# Patient Record
Sex: Female | Born: 1970 | Race: White | Hispanic: No | Marital: Married | Smoking: Current some day smoker
Health system: Southern US, Community
[De-identification: ages and names within clinical notes are randomized; demographics above are authoritative.]

## PROBLEM LIST (undated history)

## (undated) DIAGNOSIS — F32A Depression, unspecified: Secondary | ICD-10-CM

## (undated) DIAGNOSIS — E669 Obesity, unspecified: Secondary | ICD-10-CM

## (undated) DIAGNOSIS — E079 Disorder of thyroid, unspecified: Secondary | ICD-10-CM

## (undated) DIAGNOSIS — T7840XA Allergy, unspecified, initial encounter: Secondary | ICD-10-CM

## (undated) DIAGNOSIS — E7800 Pure hypercholesterolemia, unspecified: Secondary | ICD-10-CM

## (undated) DIAGNOSIS — K8020 Calculus of gallbladder without cholecystitis without obstruction: Secondary | ICD-10-CM

## (undated) DIAGNOSIS — K219 Gastro-esophageal reflux disease without esophagitis: Secondary | ICD-10-CM

## (undated) DIAGNOSIS — R0902 Hypoxemia: Secondary | ICD-10-CM

## (undated) DIAGNOSIS — M1990 Unspecified osteoarthritis, unspecified site: Secondary | ICD-10-CM

## (undated) DIAGNOSIS — M47816 Spondylosis without myelopathy or radiculopathy, lumbar region: Secondary | ICD-10-CM

## (undated) DIAGNOSIS — M549 Dorsalgia, unspecified: Secondary | ICD-10-CM

## (undated) DIAGNOSIS — F419 Anxiety disorder, unspecified: Secondary | ICD-10-CM

## (undated) DIAGNOSIS — K7581 Nonalcoholic steatohepatitis (NASH): Secondary | ICD-10-CM

## (undated) DIAGNOSIS — G709 Myoneural disorder, unspecified: Secondary | ICD-10-CM

## (undated) DIAGNOSIS — K7460 Unspecified cirrhosis of liver: Secondary | ICD-10-CM

## (undated) DIAGNOSIS — E119 Type 2 diabetes mellitus without complications: Secondary | ICD-10-CM

## (undated) HISTORY — DX: Anxiety disorder, unspecified: F41.9

## (undated) HISTORY — DX: Calculus of gallbladder without cholecystitis without obstruction: K80.20

## (undated) HISTORY — PX: LIVER BIOPSY: SHX301

## (undated) HISTORY — DX: Obesity, unspecified: E66.9

## (undated) HISTORY — DX: Myoneural disorder, unspecified: G70.9

## (undated) HISTORY — DX: Pure hypercholesterolemia, unspecified: E78.00

## (undated) HISTORY — DX: Gastro-esophageal reflux disease without esophagitis: K21.9

## (undated) HISTORY — DX: Hypoxemia: R09.02

## (undated) HISTORY — DX: Depression, unspecified: F32.A

## (undated) HISTORY — PX: TONSILLECTOMY: SUR1361

## (undated) HISTORY — PX: TOTAL ABDOMINAL HYSTERECTOMY: SHX209

---

## 1997-11-09 ENCOUNTER — Other Ambulatory Visit: Admission: RE | Admit: 1997-11-09 | Discharge: 1997-11-09 | Payer: Self-pay | Admitting: Obstetrics and Gynecology

## 1998-06-16 ENCOUNTER — Inpatient Hospital Stay (HOSPITAL_COMMUNITY): Admission: AD | Admit: 1998-06-16 | Discharge: 1998-06-19 | Payer: Self-pay | Admitting: Obstetrics & Gynecology

## 1999-08-02 ENCOUNTER — Emergency Department (HOSPITAL_COMMUNITY): Admission: EM | Admit: 1999-08-02 | Discharge: 1999-08-02 | Payer: Self-pay | Admitting: Emergency Medicine

## 2000-09-05 ENCOUNTER — Emergency Department (HOSPITAL_COMMUNITY): Admission: EM | Admit: 2000-09-05 | Discharge: 2000-09-06 | Payer: Self-pay | Admitting: *Deleted

## 2000-09-06 ENCOUNTER — Encounter: Payer: Self-pay | Admitting: Emergency Medicine

## 2000-11-04 ENCOUNTER — Emergency Department (HOSPITAL_COMMUNITY): Admission: EM | Admit: 2000-11-04 | Discharge: 2000-11-04 | Payer: Self-pay

## 2001-11-22 ENCOUNTER — Encounter: Payer: Self-pay | Admitting: Emergency Medicine

## 2001-11-22 ENCOUNTER — Emergency Department (HOSPITAL_COMMUNITY): Admission: EM | Admit: 2001-11-22 | Discharge: 2001-11-22 | Payer: Self-pay | Admitting: Emergency Medicine

## 2002-02-28 ENCOUNTER — Other Ambulatory Visit: Admission: RE | Admit: 2002-02-28 | Discharge: 2002-02-28 | Payer: Self-pay | Admitting: Obstetrics & Gynecology

## 2002-09-30 ENCOUNTER — Emergency Department (HOSPITAL_COMMUNITY): Admission: EM | Admit: 2002-09-30 | Discharge: 2002-09-30 | Payer: Self-pay | Admitting: Emergency Medicine

## 2003-02-21 HISTORY — PX: ABDOMINAL HYSTERECTOMY: SHX81

## 2003-06-22 ENCOUNTER — Emergency Department (HOSPITAL_COMMUNITY): Admission: EM | Admit: 2003-06-22 | Discharge: 2003-06-22 | Payer: Self-pay | Admitting: Family Medicine

## 2003-07-30 ENCOUNTER — Inpatient Hospital Stay (HOSPITAL_COMMUNITY): Admission: AD | Admit: 2003-07-30 | Discharge: 2003-07-31 | Payer: Self-pay | Admitting: Obstetrics & Gynecology

## 2003-08-06 ENCOUNTER — Other Ambulatory Visit: Admission: RE | Admit: 2003-08-06 | Discharge: 2003-08-06 | Payer: Self-pay | Admitting: Obstetrics and Gynecology

## 2003-08-17 ENCOUNTER — Encounter (INDEPENDENT_AMBULATORY_CARE_PROVIDER_SITE_OTHER): Payer: Self-pay | Admitting: Specialist

## 2003-08-17 ENCOUNTER — Ambulatory Visit (HOSPITAL_COMMUNITY): Admission: RE | Admit: 2003-08-17 | Discharge: 2003-08-17 | Payer: Self-pay | Admitting: Obstetrics and Gynecology

## 2003-12-24 ENCOUNTER — Ambulatory Visit: Payer: Self-pay | Admitting: Family Medicine

## 2004-02-08 ENCOUNTER — Ambulatory Visit: Payer: Self-pay | Admitting: Family Medicine

## 2004-02-17 ENCOUNTER — Ambulatory Visit: Payer: Self-pay | Admitting: Family Medicine

## 2004-02-29 ENCOUNTER — Ambulatory Visit: Payer: Self-pay | Admitting: Family Medicine

## 2004-05-23 ENCOUNTER — Ambulatory Visit: Payer: Self-pay | Admitting: Family Medicine

## 2004-07-03 ENCOUNTER — Emergency Department (HOSPITAL_COMMUNITY): Admission: EM | Admit: 2004-07-03 | Discharge: 2004-07-03 | Payer: Self-pay | Admitting: Emergency Medicine

## 2004-07-06 ENCOUNTER — Emergency Department (HOSPITAL_COMMUNITY): Admission: EM | Admit: 2004-07-06 | Discharge: 2004-07-06 | Payer: Self-pay | Admitting: Emergency Medicine

## 2004-08-23 ENCOUNTER — Ambulatory Visit: Payer: Self-pay | Admitting: Internal Medicine

## 2004-08-25 ENCOUNTER — Emergency Department (HOSPITAL_COMMUNITY): Admission: EM | Admit: 2004-08-25 | Discharge: 2004-08-25 | Payer: Self-pay | Admitting: Family Medicine

## 2004-10-18 ENCOUNTER — Ambulatory Visit: Payer: Self-pay | Admitting: Family Medicine

## 2004-11-08 ENCOUNTER — Ambulatory Visit: Payer: Self-pay | Admitting: Internal Medicine

## 2004-11-10 ENCOUNTER — Inpatient Hospital Stay (HOSPITAL_COMMUNITY): Admission: AD | Admit: 2004-11-10 | Discharge: 2004-11-10 | Payer: Self-pay | Admitting: *Deleted

## 2004-11-15 ENCOUNTER — Ambulatory Visit: Payer: Self-pay | Admitting: *Deleted

## 2004-11-22 ENCOUNTER — Encounter: Payer: Self-pay | Admitting: Obstetrics and Gynecology

## 2004-11-22 ENCOUNTER — Ambulatory Visit: Payer: Self-pay | Admitting: Obstetrics and Gynecology

## 2004-12-06 ENCOUNTER — Ambulatory Visit: Payer: Self-pay | Admitting: Obstetrics & Gynecology

## 2004-12-29 ENCOUNTER — Ambulatory Visit: Payer: Self-pay | Admitting: Obstetrics and Gynecology

## 2005-02-20 ENCOUNTER — Ambulatory Visit: Payer: Self-pay | Admitting: Obstetrics and Gynecology

## 2005-02-20 ENCOUNTER — Inpatient Hospital Stay (HOSPITAL_COMMUNITY): Admission: RE | Admit: 2005-02-20 | Discharge: 2005-02-22 | Payer: Self-pay | Admitting: Obstetrics and Gynecology

## 2005-02-21 ENCOUNTER — Encounter (INDEPENDENT_AMBULATORY_CARE_PROVIDER_SITE_OTHER): Payer: Self-pay | Admitting: Specialist

## 2005-03-09 ENCOUNTER — Ambulatory Visit: Payer: Self-pay | Admitting: Obstetrics and Gynecology

## 2005-06-21 ENCOUNTER — Emergency Department (HOSPITAL_COMMUNITY): Admission: EM | Admit: 2005-06-21 | Discharge: 2005-06-21 | Payer: Self-pay | Admitting: Family Medicine

## 2005-07-17 ENCOUNTER — Emergency Department (HOSPITAL_COMMUNITY): Admission: EM | Admit: 2005-07-17 | Discharge: 2005-07-17 | Payer: Self-pay | Admitting: Emergency Medicine

## 2005-10-11 ENCOUNTER — Emergency Department (HOSPITAL_COMMUNITY): Admission: EM | Admit: 2005-10-11 | Discharge: 2005-10-11 | Payer: Self-pay | Admitting: Family Medicine

## 2005-11-03 ENCOUNTER — Emergency Department (HOSPITAL_COMMUNITY): Admission: EM | Admit: 2005-11-03 | Discharge: 2005-11-03 | Payer: Self-pay | Admitting: Emergency Medicine

## 2006-02-02 ENCOUNTER — Emergency Department (HOSPITAL_COMMUNITY): Admission: EM | Admit: 2006-02-02 | Discharge: 2006-02-02 | Payer: Self-pay | Admitting: Emergency Medicine

## 2006-03-13 ENCOUNTER — Emergency Department (HOSPITAL_COMMUNITY): Admission: EM | Admit: 2006-03-13 | Discharge: 2006-03-13 | Payer: Self-pay | Admitting: Family Medicine

## 2006-08-09 ENCOUNTER — Emergency Department (HOSPITAL_COMMUNITY): Admission: EM | Admit: 2006-08-09 | Discharge: 2006-08-09 | Payer: Self-pay | Admitting: Emergency Medicine

## 2006-08-20 ENCOUNTER — Emergency Department (HOSPITAL_COMMUNITY): Admission: EM | Admit: 2006-08-20 | Discharge: 2006-08-20 | Payer: Self-pay | Admitting: Emergency Medicine

## 2006-09-10 DIAGNOSIS — J45909 Unspecified asthma, uncomplicated: Secondary | ICD-10-CM | POA: Insufficient documentation

## 2006-09-10 DIAGNOSIS — R519 Headache, unspecified: Secondary | ICD-10-CM | POA: Insufficient documentation

## 2006-09-10 DIAGNOSIS — R51 Headache: Secondary | ICD-10-CM

## 2006-09-10 DIAGNOSIS — F411 Generalized anxiety disorder: Secondary | ICD-10-CM | POA: Insufficient documentation

## 2006-09-10 DIAGNOSIS — F329 Major depressive disorder, single episode, unspecified: Secondary | ICD-10-CM

## 2006-10-03 ENCOUNTER — Emergency Department (HOSPITAL_COMMUNITY): Admission: EM | Admit: 2006-10-03 | Discharge: 2006-10-03 | Payer: Self-pay | Admitting: Emergency Medicine

## 2006-10-18 ENCOUNTER — Emergency Department (HOSPITAL_COMMUNITY): Admission: EM | Admit: 2006-10-18 | Discharge: 2006-10-18 | Payer: Self-pay | Admitting: Emergency Medicine

## 2007-01-13 ENCOUNTER — Emergency Department (HOSPITAL_COMMUNITY): Admission: EM | Admit: 2007-01-13 | Discharge: 2007-01-13 | Payer: Self-pay | Admitting: Emergency Medicine

## 2007-04-17 ENCOUNTER — Emergency Department (HOSPITAL_COMMUNITY): Admission: EM | Admit: 2007-04-17 | Discharge: 2007-04-17 | Payer: Self-pay | Admitting: Family Medicine

## 2007-06-19 ENCOUNTER — Ambulatory Visit: Payer: Self-pay | Admitting: Family Medicine

## 2007-09-09 ENCOUNTER — Emergency Department (HOSPITAL_COMMUNITY): Admission: EM | Admit: 2007-09-09 | Discharge: 2007-09-09 | Payer: Self-pay | Admitting: Family Medicine

## 2007-11-25 ENCOUNTER — Emergency Department (HOSPITAL_COMMUNITY): Admission: EM | Admit: 2007-11-25 | Discharge: 2007-11-25 | Payer: Self-pay | Admitting: Family Medicine

## 2008-02-05 ENCOUNTER — Encounter: Admission: RE | Admit: 2008-02-05 | Discharge: 2008-02-05 | Payer: Self-pay | Admitting: Internal Medicine

## 2008-02-19 ENCOUNTER — Emergency Department (HOSPITAL_COMMUNITY): Admission: EM | Admit: 2008-02-19 | Discharge: 2008-02-19 | Payer: Self-pay | Admitting: Family Medicine

## 2008-04-30 ENCOUNTER — Emergency Department (HOSPITAL_COMMUNITY): Admission: EM | Admit: 2008-04-30 | Discharge: 2008-04-30 | Payer: Self-pay | Admitting: Emergency Medicine

## 2009-02-12 ENCOUNTER — Emergency Department (HOSPITAL_COMMUNITY): Admission: EM | Admit: 2009-02-12 | Discharge: 2009-02-12 | Payer: Self-pay | Admitting: Family Medicine

## 2009-06-09 ENCOUNTER — Emergency Department (HOSPITAL_COMMUNITY): Admission: EM | Admit: 2009-06-09 | Discharge: 2009-06-09 | Payer: Self-pay | Admitting: Family Medicine

## 2009-06-30 ENCOUNTER — Emergency Department (HOSPITAL_COMMUNITY): Admission: EM | Admit: 2009-06-30 | Discharge: 2009-06-30 | Payer: Self-pay | Admitting: Family Medicine

## 2010-05-03 LAB — DIFFERENTIAL
Monocytes Absolute: 0.5 10*3/uL (ref 0.1–1.0)
Monocytes Relative: 7 % (ref 3–12)
Neutro Abs: 3.9 10*3/uL (ref 1.7–7.7)
Neutrophils Relative %: 57 % (ref 43–77)

## 2010-05-03 LAB — CBC
HCT: 45.1 % (ref 36.0–46.0)
Platelets: 176 10*3/uL (ref 150–400)
RBC: 5.13 MIL/uL — ABNORMAL HIGH (ref 3.87–5.11)
RDW: 13.1 % (ref 11.5–15.5)

## 2010-06-03 NOTE — Op Note (Signed)
NAMETRESA, Teresa Castaneda NO.:  [id number]   MEDICAL RECORD NO.:  [id number]          PATIENT TYPE:  INP   LOCATION:  9317                          FACILITY:  [facility]   PHYSICIAN:  Phil D. Kalman Shan, M.D.     DATE OF BIRTH:  Jun 21, 1970   DATE OF PROCEDURE:  02/20/2005  DATE OF DISCHARGE:                                 OPERATIVE REPORT   PROCEDURE:  Total vaginal hysterectomy.   PREOPERATIVE DIAGNOSES:  1.  Menometrorrhagia.  2.  Dysmenorrhea.   POSTOPERATIVE DIAGNOSES:  1.  Menometrorrhagia.  2.  Dysmenorrhea.  3.  Pending pathology report.   SURGEON:  Franchot Heidelberg. Kalman Shan, M.D.   ASSISTANT:  Standley Dakins. Kennon Rounds, M.D.   ANESTHESIA:  General.   POSTOPERATIVE CONDITION:  Satisfactory.   ESTIMATED BLOOD LOSS:  150 mL.   SPECIMENS TO PATHOLOGY:  Uterus.   PROCEDURE:  Under satisfactory general anesthesia, the patient in dorsal  lithotomy position, the perineum and vagina were prepped and draped in the  usual sterile manner.  Bimanual pelvic examination revealed a uterus of  normal size, shape, consistency, anteflexed, freely movable, with normal  free adnexa.  A weighted speculum was placed at the posterior fourchette of  the vagina through a marital introitus, BUS within normal limits, vagina is  clean and parous, and was grasped to Lahey clamps.  Circumferential  injections of 1% Xylocaine with 1:200,000 epinephrine was injected  submucosally around the entire cervix approximately 1 cm from the distal  end.  A circular incision over that same area was done with a scalpel and  using blunt dissection, the tissue was pushed away from the distal end of  the cervix.  The cul-de-sac of Nathaneil Canary was opened by sharp dissection and a  figure-of-eight suture of #1 chromic was placed there for hemostasis.  Then  using a Heaney clamp, the pedicles were serially clamped and divided  starting with the uterosacrals., te cardinals and the packet containing the  uterine vessels.  These were  all ligated with #1 chromic catgut suture  ligatures.  The peritoneal cavity was then entered beneath the bladder by  sharp dissection and a final pedicle taken including the from the posterior  to the anterior peritoneum, ligated with #1 chromic catgut suture ligature  after being divided away from the uterus, a free tie placed around the  remaining utero-ovarian pedicles, and the tissue medially clamped, divided  and the lateral pedicle ligated with #1 chromic catgut suture ligatures  where the tie was held long.  There was a small bleeder on the left side,  which was grasped in the pedicle and ligated with a free tie of #1 chromic  catgut.  The area was observed.  No significant bleeding was noted,  and the vaginal cuff was closed with continuous running #1 chromic catgut,  locked, on an atraumatic needle going from peritoneum through mucosa.  All  sutures were then cut short, a Foley catheter placed in the bladder, which  was draining clear amber urine at the end of the procedure.  ______________________________  Franchot Heidelberg Kalman Shan, M.D.     PDR/MEDQ  D:  02/20/2005  T:  02/20/2005  Job:  [id number]

## 2010-06-03 NOTE — Group Therapy Note (Signed)
Teresa Castaneda, Teresa Castaneda NO.:  [id number]   MEDICAL RECORD NO.:  [id number]          PATIENT TYPE:  WOC   LOCATION:  [facility]                   FACILITY:  [facility]   PHYSICIAN:  Andrew Au, MD        DATE OF BIRTH:  January 09, 1971   DATE OF SERVICE:  12/29/2004                                    CLINIC NOTE   The patient is a 41 year old gravida 2, para 2-0-0-2, with a history of  postpartum tubal ligation, endometrial balloon ablation, several D&Cs, and  hysteroscopy for intractable dysmenorrhea and menorrhagia.  At one time in  the past, she bled for 72 days prior to the ablation, but now again is  bleeding heavily and having severe disabling pain.  Her mother and sister  both had hysterectomies for endometriosis and she was under the impression  that she was told she had endometriosis although we cannot find any record  in the hospital records of laparoscopy to confirm that.  On the other hand,  the patient has symptoms that are very consistent with adenomyosis, and  desires hysterectomy.  We are going to have scheduled for a vaginal  hysterectomy and we discussed with her the possible complications,  especially those related to anesthesia, injury to bowel, urinary tract,  postpartum hemorrhage, infection, and death.  We will schedule her for  vaginal hysterectomy.  We told her we would not take out the ovaries unless  there was some abnormality with them.   DIAGNOSIS:  Intractable dysmenorrhea and menorrhagia probably secondary to  adenomyosis.           ______________________________  Andrew Au, MD     PR/MEDQ  D:  12/29/2004  T:  12/29/2004  Job:  [id number]

## 2010-06-03 NOTE — Op Note (Signed)
NAME:  Teresa Castaneda, Teresa Castaneda                         ACCOUNT NO.:  [id number]   MEDICAL RECORD NO.:  [id number]                   PATIENT TYPE:  SPE   LOCATION:  [facility]                                 FACILITY:  [facility]   PHYSICIAN:  Michelle L. Helane Rima, M.D.            DATE OF BIRTH:  08/09/1970   DATE OF PROCEDURE:  08/17/2003  DATE OF DISCHARGE:                                 OPERATIVE REPORT   PREOPERATIVE DIAGNOSIS:  ____________   POSTOPERATIVE DIAGNOSIS:  _____________   OPERATION PERFORMED:  Dilation and curettage.   SURGEON:  Michelle L. Helane Rima, M.D.   ANESTHESIA:  LMA with paracervical block.   ESTIMATED BLOOD LOSS:  Less than 50 mL.   DESCRIPTION OF PROCEDURE:  The patient was taken to the operating room.  She  was given anesthesia and placed in the lithotomy position.  She was prepped  and draped in the usual sterile fashion.  In and out catheter was used to  empty the bladder.  A speculum was inserted into the vagina.  The cervix was  grasped with a tenaculum and a paracervical block was performed in standard  fashion.  The uterus was sounded to 9 cm and  noted to be in the midline.  The cervical internal os was gently dilated using Pratt dilators.  The  diagnostic hysteroscope was inserted and with good visualization and good  distention, the entire endometrium was visualized.  There was a large blood  clot in the endometrium.  There was some polypoid endometrium and some  polypoid appearing tissue emanating from the anterior and lateral walls of  the uterus.  The hysteroscope was removed and a sharp curet was inserted and  the uterus was thoroughly curetted of all tissue.  Polyp forceps were then  used to remove all the tissue.  There was a moderate amount of tissue and  then grossly it appeared consistent with polyps.  We then placed a  Thermachoice 3 endometrial balloon into the uterine cavity and performed a  Thermachoice endometrial ablation in the standard fashion  according to the  manufacturer's specification.  At the end of the procedure, the intact  balloon was removed from the uterus.  There was no vaginal bleeding noted.  All instruments were removed from the vagina.  The patient was given Toradol  and taken to the recovery room in stable condition.  All sponge, laps and  instrument counts were correct times two.   PATHOLOGY:  Uterine curettings.   DRAINS:  None.                                               Michelle L. Helane Rima, M.D.    Nevin Bloodgood  D:  08/17/2003  T:  08/17/2003  Job:  [id number]

## 2010-06-03 NOTE — Op Note (Signed)
NAME:  Teresa Castaneda, Teresa Castaneda                         ACCOUNT NO.:  [id number]   MEDICAL RECORD NO.:  [id number]                   PATIENT TYPE:  AMB   LOCATION:  [facility]                                  FACILITY:  [facility]   PHYSICIAN:  Michelle L. Helane Rima, M.D.            DATE OF BIRTH:  1970-03-17   DATE OF PROCEDURE:  08/17/2003  DATE OF DISCHARGE:  08/17/2003                                 OPERATIVE REPORT   PREOPERATIVE DIAGNOSIS:  Menorrhagia and endometrial polyps.   POSTOPERATIVE DIAGNOSIS:  Menorrhagia and endometrial polyps.   OPERATION PERFORMED:  Dilation and curettage, diagnostic hysteroscopy and  thermal balloon endometrial ablation.   SURGEON:  Michelle L. Helane Rima, M.D.   ANESTHESIA:  LMA with paracervical block.   ESTIMATED BLOOD LOSS:  Less than 50 mL.   DESCRIPTION OF PROCEDURE:  The patient was taken to the operating room.  She  was given anesthesia and placed in the lithotomy position.  She was prepped  and draped in the usual sterile fashion.  In and out catheter was used to  empty the bladder.  A speculum was inserted into the vagina.  The cervix was  grasped with a tenaculum and a paracervical block was performed in standard  fashion.  The uterus was sounded to 9 cm and noted to be in the midline.  The cervical internal os was gently dilated using Pratt dilators.  The  diagnostic hysteroscope was inserted and with good visualization and good  distention, the entire endometrium was visualized.  There was a large blood  clot in the endometrial cavity.  There was some polypoid endometrium and  some polypoid appearing tissue emanating from the anterior and lateral lobe  of the uterus.  The hysteroscope was removed and a sharp curet was inserted  and the uterus was thoroughly curetted of all tissue.  Polyp forceps were  then used to remove all the tissue.  There was a moderate amount of tissue  and grossly this appeared consistent with polyp.  We then placed a  ThermaChoice 3  endometrial balloon into the uterine cavity and performed a  ThermaChoice endometrial ablation in standard fashion according to the  manufacturer's specification.  At the end of the procedure, the intact  balloon was removed from the uterus.  There was no vaginal bleeding noted.  All instruments were removed from the vagina.  The patient was given Toradol  and taken to recovery room in stable condition.  All sponge, lap and  instrument counts were correct times two.   PATHOLOGY:  Uterine curettings.   DRAINS:  None.                                               Michelle L. Helane Rima, M.D.    Nevin Bloodgood  D:  08/19/2003  T:  08/19/2003  Job:  [id number]

## 2010-06-03 NOTE — Discharge Summary (Signed)
NAMELATISH, TOUTANT NO.:  [id number]   MEDICAL RECORD NO.:  [id number]          PATIENT TYPE:  INP   LOCATION:  9317                          FACILITY:  [facility]   PHYSICIAN:  Phil D. Kalman Shan, M.D.     DATE OF BIRTH:  [birth year]   DATE OF ADMISSION:  02/20/2005  DATE OF DISCHARGE:  02/22/2005                                 DISCHARGE SUMMARY   The patient is a 40 year old white female who underwent vaginal hysterectomy  on the day of admission for severe dysmenorrhea and menometrorrhagia.  She  has had an afebrile postoperative course, has done very well, is comfortable  and able to take her pain medication adequately.  She had on admission a  hemoglobin of 14.6, is a smoker, and a discharge hemoglobin of 11.5.  Her  white count is normal at 7.3.  She has been given strict instructions as to  follow-up activity and diet.  We will see her in the GYN clinic in two weeks  and have restricted her as far as heavy lifting and stairs.  We are  discharging her on Motrin on a regular basis every six hours, Percocet  p.r.n.   DISCHARGE DIAGNOSIS:  Satisfactory recovery, post vaginal hysterectomy.  Pathology report not available at this point.           ______________________________  Franchot Heidelberg. Kalman Shan, M.D.     PDR/MEDQ  D:  02/22/2005  T:  02/22/2005  Job:  [id number]

## 2010-06-03 NOTE — Group Therapy Note (Signed)
[name], Teresa Castaneda NO.:  [id number]   MEDICAL RECORD NO.:  [id number]          PATIENT TYPE:  WOC   LOCATION:  [facility]                   FACILITY:  [facility]   PHYSICIAN:  Andrew Au, MD        DATE OF BIRTH:  12/28/1970   DATE OF SERVICE:  11/22/2004                                    CLINIC NOTE   Patient is a 40 year old white female gravida 2, para 2-0-0-2 who had a  tubal ligation after her last pregnancy by laparoscopy.  She had an  endometrial ablation done by balloon a year ago last July by Dr. Helane Rima who  told her at the time she had endometriosis.  She has had very light  irregular periods since that time but when they come she has a lot of pain.  Her sister and mother both had hysterectomies for endometriosis and she went  into the [facility] about two weeks ago with severe abdominal pain very similar to  what she normally gets with her period but it did not go away and she did  not have any period.  She began bleeding slightly after her visit there.  They did an ultrasound which shows a small hemorrhagic right ovarian cyst  but most of her pain was on the left.   PHYSICAL EXAMINATION:  GENITOURINARY:  On examination in which time a Pap  smear was done here the external genitalia is normal.  BUS within normal  limits.  Vagina is clean and well rugated.  The cervix is clean and parous.  The uterus is normal size, shape, consistency.  She has slight tenderness in  the adnexa bilaterally and the ovaries palpate to be normal.   I am going to wait two weeks to reevaluate the patient.  If she is still  having the abdominal pain I am going to admit her and do a D&C and  laparoscopic examination.  If the abdominal pain is gone away I think she  may be helped by a dilatation and curettage and she may have some  endometrial synechiae which may be accounting for her pain once she does  have a period and the slow drainage.  Ultrasound did show an irregular  endometrium.   IMPRESSION:  1.  Pelvic pain.  2.  History of endometriosis, familial and personal.  3.  Irregular periods with dysmenorrhea.           ______________________________  Andrew Au, MD     PR/MEDQ  D:  11/22/2004  T:  11/23/2004  Job:  [id number]

## 2010-06-03 NOTE — H&P (Signed)
NAMEVICTORY, Teresa Castaneda NO.:  [id number]   MEDICAL RECORD NO.:  [id number]          PATIENT TYPE:  INP   LOCATION:  NA                            FACILITY:  [facility]   PHYSICIAN:  Phil D. Kalman Shan, M.D.     DATE OF BIRTH:  1970-11-11   DATE OF ADMISSION:  02/20/2005  DATE OF DISCHARGE:                                HISTORY & PHYSICAL   CHIEF COMPLAINT:  Severe pain with her periods, constant backache, and low  abdominal pain in between periods and extremely heavy periods.   HISTORY OF PRESENT ILLNESS:  The patient is a 40 year old gravida 2, para 2-  0-0-2, who had a post-delivery tubal ligation some years ago and has had  symptoms of a severe increasing dysmenorrhea and abnormal uterine bleeding,  at times very heavy with clots, over the past several years.  She has had a  D&C and endometrial ablation by Dr. Helane Rima in August 2005 with no helpful  results.  She is a heavy smoker, so hormone therapy to control her bleeding  and pain is not an option.  The plan is total vaginal hysterectomy, possible  abdominal hysterectomy.  We had the patient in today for her history and  physical and also on consultation, in which we described the possible  shortcomings, that all her back pain especially may not be relieved, that  she would still possibly have some pain at the time she normally would have  had periods, that we plan on leaving her ovaries unless they are markedly  abnormal, that the biggest risks are the anesthetic complications and injury  ureters and to bowel, bladder, postoperative and intraoperative hemorrhage,  and infection, and if we injure the bladder that the catheter would have to  be in for at least a week.  We discussed postoperative precautions including  those with the Foley catheter, heavy lifting, restriction from stairs.  We  discussed diet and we discussed preoperative prep.  The plan is total  vaginal hysterectomy.   The patient has no allergies, but  codeine makes her nauseated.   She is on Wellbutrin XL for depression and takes over-the-counter asthma  medications p.r.n.   MEDIAL HISTORY:  She has no significant medical history with the exception  of irregular episodes of mild asthma.   FAMILY HISTORY:  Her mother had diabetes and died from complications of  that.  Her father has had a coronary occlusion and is on a pacemaker.  No  family history of bleeding disorders.   REVIEW OF SYSTEMS:  Negative with the exception of the above-mentioned  problems.   PHYSICAL EXAMINATION:  VITAL SIGNS:  The patient is 5 feet 6 inches, weighs  195 pounds.  Has a blood pressure of 125/79, pulse of 89, temperature 96.7,  respirations of 18 per minute.  GENERAL:  She is a well-developed, slightly obese white female in no acute  distress.  HEENT:  Within normal limits.  NECK:  Supple, thyroid symmetrical, with no masses.  BACK:  Erect.  BREASTS:  Symmetrical with no dominant masses and no nipple discharge.  CARDIAC:  Normal sinus rhythm.  No gallops.  PMI is in the fifth intercostal  space, midclavicular line.  ABDOMEN:  Soft and flat, nontender, no masses, no organomegaly.  LUNGS:  Clear to auscultation and percussion.  No CVA tenderness.  GENITALIA:  The external genitalia is normal.  BUS within normal limits.  Vagina is clean and well-rugated.  The cervix is clean and parous.  The  uterus is normal size, shape and consistency.  Adnexa could not be well-  outlined because of the habitus of the patient.  RECTAL:  No significant masses.  SKIN:  Normal turgor and pallor.  EXTREMITIES:  Deep tendon reflexes are within normal limits.  There are no  varices and no edema.   IMPRESSION:  Generally good health, with severe increasing dysmenorrhea,  pelvic pain and menorrhagia.  Probable adenomyosis.   PLAN:  Vaginal hysterectomy.           ______________________________  Franchot Heidelberg Kalman Shan, M.D.     PDR/MEDQ  D:  02/16/2005  T:  02/16/2005   Job:  [id number]

## 2010-10-21 LAB — INFLUENZA A AND B ANTIGEN (CONVERTED LAB)
Inflenza A Ag: NEGATIVE
Influenza B Ag: NEGATIVE

## 2010-10-21 LAB — I-STAT 8, (EC8 V) (CONVERTED LAB)
Acid-base deficit: 1
Chloride: 108
Operator id: 247071
Potassium: 3.9
Sodium: 137
pH, Ven: 7.416 — ABNORMAL HIGH

## 2010-10-21 LAB — DIFFERENTIAL
Basophils Absolute: 0
Basophils Relative: 1
Eosinophils Relative: 6 — ABNORMAL HIGH
Lymphocytes Relative: 32
Neutrophils Relative %: 51

## 2010-10-21 LAB — CBC
Hemoglobin: 15.2 — ABNORMAL HIGH
Platelets: 170
RBC: 4.99
WBC: 4.3

## 2011-06-14 ENCOUNTER — Encounter (HOSPITAL_COMMUNITY): Payer: Self-pay | Admitting: Emergency Medicine

## 2011-06-14 ENCOUNTER — Emergency Department (HOSPITAL_COMMUNITY): Payer: Self-pay

## 2011-06-14 ENCOUNTER — Emergency Department (HOSPITAL_COMMUNITY)
Admission: EM | Admit: 2011-06-14 | Discharge: 2011-06-14 | Disposition: A | Discharge status: Home / Self Care | Payer: Self-pay | Attending: Emergency Medicine | Admitting: Emergency Medicine

## 2011-06-14 DIAGNOSIS — J45909 Unspecified asthma, uncomplicated: Secondary | ICD-10-CM | POA: Insufficient documentation

## 2011-06-14 DIAGNOSIS — F17200 Nicotine dependence, unspecified, uncomplicated: Secondary | ICD-10-CM | POA: Insufficient documentation

## 2011-06-14 DIAGNOSIS — M79609 Pain in unspecified limb: Secondary | ICD-10-CM | POA: Insufficient documentation

## 2011-06-14 DIAGNOSIS — M79673 Pain in unspecified foot: Secondary | ICD-10-CM

## 2011-06-14 MED ORDER — MELOXICAM 7.5 MG PO TABS: ORAL_TABLET | ORAL | Status: DC

## 2011-06-14 MED ORDER — MELOXICAM 7.5 MG PO TABS: 7.5000 mg | ORAL_TABLET | Freq: Every day | ORAL | Status: DC

## 2011-06-14 MED ORDER — HYDROCODONE-ACETAMINOPHEN 5-325 MG PO TABS: 1.0000 | ORAL_TABLET | ORAL | Status: AC | PRN

## 2011-06-14 MED ORDER — DEXAMETHASONE SODIUM PHOSPHATE 4 MG/ML IJ SOLN
8.0000 mg | Freq: Once | INTRAMUSCULAR | Status: AC
  Administered 2011-06-14: 8 mg via INTRAMUSCULAR
  Filled 2011-06-14: qty 2

## 2011-06-14 NOTE — Discharge Instructions (Signed)
Please elevate your foot as much as possible.  Use the splint for the next 14 days. If pain continues, please see the podiatrist noted above for additional evaluation. Mobic 2 times daily with food. Norco with food.

## 2011-06-14 NOTE — ED Notes (Signed)
Pt c/o left upper foot pain. Pt denies any injury.

## 2011-06-28 NOTE — ED Provider Notes (Signed)
History     CSN: [id number]  Arrival date & time 06/14/11  0277   First MD Initiated Contact with Patient 06/14/11 [phone]      Chief Complaint  Patient presents with  . Foot Pain    (Consider location/radiation/quality/duration/timing/severity/associated sxs/prior treatment) Patient is a 41 y.o. female presenting with lower extremity pain. The history is provided by the patient.  Foot Pain This is a new problem. The current episode started 1 to 4 weeks ago. The problem occurs every several days. The problem has been gradually worsening. Associated symptoms include arthralgias. Pertinent negatives include no abdominal pain, chest pain, coughing, fever or neck pain. The symptoms are aggravated by standing and walking. She has tried NSAIDs for the symptoms. The treatment provided no relief.    Past Medical History  Diagnosis Date  . Asthma     Past Surgical History  Procedure Date  . Abdominal hysterectomy     History reviewed. No pertinent family history.  History  Substance Use Topics  . Smoking status: Current Everyday Smoker    Types: Cigarettes  . Smokeless tobacco: Not on file  . Alcohol Use: No    OB History    Grav Para Term Preterm Abortions TAB SAB Ect Mult Living                  Review of Systems  Constitutional: Negative for fever and activity change.       All ROS Neg except as noted in HPI  HENT: Negative for nosebleeds and neck pain.   Eyes: Negative for photophobia and discharge.  Respiratory: Negative for cough, shortness of breath and wheezing.   Cardiovascular: Negative for chest pain and palpitations.  Gastrointestinal: Negative for abdominal pain and blood in stool.  Genitourinary: Negative for dysuria, frequency and hematuria.  Musculoskeletal: Positive for arthralgias. Negative for back pain.  Skin: Negative.   Neurological: Negative for dizziness, seizures and speech difficulty.  Psychiatric/Behavioral: Negative for hallucinations and  confusion.    Allergies  Codeine  Home Medications   Current Outpatient Rx  Name Route Sig Dispense Refill  . ALBUTEROL SULFATE HFA 108 (90 BASE) MCG/ACT IN AERS Inhalation Inhale 2 puffs into the lungs every 6 (six) hours as needed. For shortness of breath and wheezing    . FLUTICASONE-SALMETEROL 100-50 MCG/DOSE IN AEPB Inhalation Inhale 1 puff into the lungs at bedtime.    . IBUPROFEN 200 MG PO TABS Oral Take 800 mg by mouth every 12 (twelve) hours as needed. For foot pain    . MELOXICAM 7.5 MG PO TABS Oral Take 1 tablet (7.5 mg total) by mouth daily. 12 tablet 0    BP 119/85  Pulse 77  Temp 97.9 F (36.6 C) (Oral)  Resp 18  Ht 5' 7"  (1.702 m)  Wt 243 lb (110.224 kg)  BMI 38.06 kg/m2  SpO2 96%  Physical Exam  Nursing note and vitals reviewed. Constitutional: She is oriented to person, place, and time. She appears well-developed and well-nourished.  Non-toxic appearance.  HENT:  Head: Normocephalic.  Right Ear: Tympanic membrane and external ear normal.  Left Ear: Tympanic membrane and external ear normal.  Eyes: EOM and lids are normal. Pupils are equal, round, and reactive to light.  Neck: Normal range of motion. Neck supple. Carotid bruit is not present.  Cardiovascular: Normal rate, regular rhythm, normal heart sounds, intact distal pulses and normal pulses.   Pulmonary/Chest: Breath sounds normal. No respiratory distress.  Abdominal: Soft. Bowel sounds are normal. There  is no tenderness. There is no guarding.  Musculoskeletal: Normal range of motion.       Pain to palpation of the left foot from the 1st MP to the mid anterior ankle. No hot joint. No lesions between the toes. No puncture wounds of the plantar area.  Lymphadenopathy:       Head (right side): No submandibular adenopathy present.       Head (left side): No submandibular adenopathy present.    She has no cervical adenopathy.  Neurological: She is alert and oriented to person, place, and time. She has  normal strength. No cranial nerve deficit or sensory deficit.  Skin: Skin is warm and dry.  Psychiatric: She has a normal mood and affect. Her speech is normal.    ED Course  Procedures (including critical care time)  Labs Reviewed - No data to display No results found.   1. Foot pain       MDM  I have reviewed nursing notes, vital signs, and all appropriate lab and imaging results for this patient. Pt referred to podiatry. Rx for Mobic an norco given.       Lenox Ahr, [place] 06/28/11 2229

## 2011-06-29 NOTE — ED Provider Notes (Signed)
Medical screening examination/treatment/procedure(s) were performed by non-physician practitioner and as supervising physician I was immediately available for consultation/collaboration.  Jasper Riling. Alvino Chapel, MD 06/29/11 2449

## 2011-08-04 ENCOUNTER — Encounter (HOSPITAL_COMMUNITY): Payer: Self-pay

## 2011-08-04 ENCOUNTER — Emergency Department (INDEPENDENT_AMBULATORY_CARE_PROVIDER_SITE_OTHER)
Admission: EM | Admit: 2011-08-04 | Discharge: 2011-08-04 | Disposition: A | Discharge status: Home / Self Care | Payer: Self-pay | Source: Home / Self Care | Attending: Emergency Medicine | Admitting: Emergency Medicine

## 2011-08-04 DIAGNOSIS — R197 Diarrhea, unspecified: Secondary | ICD-10-CM

## 2011-08-04 DIAGNOSIS — R1110 Vomiting, unspecified: Secondary | ICD-10-CM

## 2011-08-04 NOTE — ED Notes (Signed)
Pt c/o possible stomach virus.  Pt states she lives with sister and her children, all of which have had a stomach virus for past several days.  Pt awoke this am with SX as well.  Pt states she needs a work note.

## 2011-08-04 NOTE — ED Provider Notes (Signed)
History     CSN: [id number]  Arrival date & time 08/04/11  1411   First MD Initiated Contact with Patient 08/04/11 1437      Chief Complaint  Patient presents with  . Abdominal Pain    (Consider location/radiation/quality/duration/timing/severity/associated sxs/prior treatment) HPI Comments: Patient presents to urgent care complaining that she thinks she has a stomach virus. She lives with her sister and her children which they have had similar symptoms for several days. She describes that she has had multiple episodes of diarrhea within the last 2 days along with sporadic vomiting. She feels somewhat a little bit better but her upper stomach feels somewhat sore. Denies any fevers today and is tolerating oral fluids. Doesn't feel like eating and is also requesting a work note she was unable to go to work today  Patient is a 41 y.o. female presenting with abdominal pain. The history is provided by the patient.  Abdominal Pain The primary symptoms of the illness include abdominal pain, nausea, vomiting and diarrhea. The primary symptoms of the illness do not include fever, fatigue, dysuria, vaginal discharge or vaginal bleeding. The current episode started 2 days ago. The onset of the illness was gradual. The problem has been gradually improving.  The patient states that she believes she is currently not pregnant. Additional symptoms associated with the illness include anorexia. Symptoms associated with the illness do not include chills, diaphoresis, constipation, frequency or back pain. Significant associated medical issues do not include GERD.    Past Medical History  Diagnosis Date  . Asthma     Past Surgical History  Procedure Date  . Abdominal hysterectomy     No family history on file.  History  Substance Use Topics  . Smoking status: Current Everyday Smoker    Types: Cigarettes  . Smokeless tobacco: Not on file  . Alcohol Use: No    OB History    Grav Para Term  Preterm Abortions TAB SAB Ect Mult Living                  Review of Systems  Constitutional: Positive for activity change. Negative for fever, chills, diaphoresis and fatigue.  Gastrointestinal: Positive for nausea, vomiting, abdominal pain, diarrhea and anorexia. Negative for constipation.  Genitourinary: Negative for dysuria, frequency, vaginal bleeding and vaginal discharge.  Musculoskeletal: Negative for back pain.  Skin: Negative for rash.  Neurological: Negative for dizziness.    Allergies  Codeine  Home Medications   Current Outpatient Rx  Name Route Sig Dispense Refill  . ALBUTEROL SULFATE HFA 108 (90 BASE) MCG/ACT IN AERS Inhalation Inhale 2 puffs into the lungs every 6 (six) hours as needed. For shortness of breath and wheezing    . FLUTICASONE-SALMETEROL 100-50 MCG/DOSE IN AEPB Inhalation Inhale 1 puff into the lungs at bedtime.    . IBUPROFEN 200 MG PO TABS Oral Take 800 mg by mouth every 12 (twelve) hours as needed. For foot pain    . MELOXICAM 7.5 MG PO TABS Oral Take 1 tablet (7.5 mg total) by mouth daily. 12 tablet 0    BP 132/71  Pulse 90  Temp 98.1 F (36.7 C) (Oral)  Resp 18  SpO2 99%  Physical Exam  Nursing note and vitals reviewed. Constitutional: Vital signs are normal. She appears well-developed and well-nourished.  Non-toxic appearance. She does not have a sickly appearance. She does not appear ill. No distress.  HENT:  Head: Normocephalic.  Mouth/Throat: Uvula is midline, oropharynx is clear and moist and  mucous membranes are normal.  Eyes: Conjunctivae are normal.  Neck: Neck supple.  Cardiovascular: Normal rate.   Abdominal: There is no tenderness.  Lymphadenopathy:    She has no cervical adenopathy.  Skin: No rash noted.    ED Course  Procedures (including critical care time)  Labs Reviewed - No data to display No results found.   1. Diarrhea   2. Vomiting       MDM  Symptoms for 48 hours with diarrheas vomiting and mild  epigastric tenderness. Soft abdomen and no signs of dehydration. Patient describes that she needs a work note and that she's actually feeling better. Patient was instructed about dietary changes for the next 48-72 hours and what symptoms will warrant further evaluation she agrees to followup as needed. Symptoms and exam were mostly consistent with a gastroenteritis given that other relatives and family members have similar symptoms. Patient looks comfortable and well        Rosana Hoes, MD 08/04/11 2032

## 2011-08-25 ENCOUNTER — Encounter (HOSPITAL_COMMUNITY): Payer: Self-pay | Admitting: Emergency Medicine

## 2011-08-25 ENCOUNTER — Emergency Department (HOSPITAL_COMMUNITY)
Admission: EM | Admit: 2011-08-25 | Discharge: 2011-08-26 | Disposition: A | Discharge status: Home / Self Care | Payer: Self-pay | Attending: Emergency Medicine | Admitting: Emergency Medicine

## 2011-08-25 DIAGNOSIS — Z79899 Other long term (current) drug therapy: Secondary | ICD-10-CM | POA: Insufficient documentation

## 2011-08-25 DIAGNOSIS — J45901 Unspecified asthma with (acute) exacerbation: Secondary | ICD-10-CM

## 2011-08-25 DIAGNOSIS — F17200 Nicotine dependence, unspecified, uncomplicated: Secondary | ICD-10-CM | POA: Insufficient documentation

## 2011-08-25 DIAGNOSIS — Z885 Allergy status to narcotic agent status: Secondary | ICD-10-CM | POA: Insufficient documentation

## 2011-08-25 DIAGNOSIS — J45909 Unspecified asthma, uncomplicated: Secondary | ICD-10-CM | POA: Insufficient documentation

## 2011-08-25 NOTE — ED Notes (Signed)
Pt states is here because of her asthma  Pt states it started bothering her yesterday at work and she used her inhaler with relief  Pt states today she is not getting relief with her inhaler so she came in  Pt has cough noted in triage  Pt is not in any acute distress

## 2011-08-26 MED ORDER — PREDNISONE (PAK) 10 MG PO TABS: 10.0000 mg | ORAL_TABLET | Freq: Every day | ORAL | Status: AC

## 2011-08-26 MED ORDER — PREDNISONE 20 MG PO TABS
60.0000 mg | ORAL_TABLET | Freq: Once | ORAL | Status: AC
  Administered 2011-08-26: 60 mg via ORAL
  Filled 2011-08-26: qty 3

## 2011-08-26 NOTE — ED Provider Notes (Signed)
History     CSN: [id number]  Arrival date & time 08/25/11  2143   First MD Initiated Contact with Patient 08/25/11 2347      Chief Complaint  Patient presents with  . Asthma    (Consider location/radiation/quality/duration/timing/severity/associated sxs/prior treatment) HPI Comments: Patient presents to ED following an asthma attack.  States she was at work (in [employer]) where it was hot and humid and she began coughing a lot, nonproductive cough, and was short of breath and wheezing.  Associated post-tussive emesis.  States she used her inhalers all afternoon and is now feeling much better.  She is shaking from her albuterol use but otherwise is feeling almost back to normal.  Also notes that being in the cool ED helped calm her asthma down.  Denies fevers, chills, chest pain, sore throat.    Patient is a 41 y.o. female presenting with asthma. The history is provided by the patient.  Asthma Associated symptoms include coughing. Pertinent negatives include no chills, fever or sore throat.    Past Medical History  Diagnosis Date  . Asthma     Past Surgical History  Procedure Date  . Abdominal hysterectomy     Family History  Problem Relation Age of Onset  . Coronary artery disease Mother   . Diabetes Mother   . COPD Mother   . Coronary artery disease Father   . Hypertension Father     History  Substance Use Topics  . Smoking status: Current Everyday Smoker    Types: Cigarettes  . Smokeless tobacco: Not on file  . Alcohol Use: No    OB History    Grav Para Term Preterm Abortions TAB SAB Ect Mult Living                  Review of Systems  Constitutional: Negative for fever and chills.  HENT: Negative for sore throat and trouble swallowing.   Respiratory: Positive for cough, chest tightness, shortness of breath and wheezing.     Allergies  Codeine  Home Medications   Current Outpatient Rx  Name Route Sig Dispense Refill  . ALBUTEROL SULFATE HFA 108 (90  BASE) MCG/ACT IN AERS Inhalation Inhale 2 puffs into the lungs every 6 (six) hours as needed. For shortness of breath and wheezing    . FLUTICASONE-SALMETEROL 100-50 MCG/DOSE IN AEPB Inhalation Inhale 1 puff into the lungs at bedtime.    . IBUPROFEN 200 MG PO TABS Oral Take 800 mg by mouth every 6 (six) hours as needed. For headache pain    . OVER THE COUNTER MEDICATION Oral Take 1 tablet by mouth 2 (two) times daily as needed. Otc. Sinus & Headache Medication      BP 108/78  Pulse 80  Temp 98.2 F (36.8 C) (Oral)  Resp 20  SpO2 99%  Physical Exam  Nursing note and vitals reviewed. Constitutional: She appears well-developed and well-nourished. No distress.  HENT:  Head: Normocephalic and atraumatic.  Mouth/Throat: Oropharynx is clear and moist. No oropharyngeal exudate.  Neck: Neck supple. No tracheal tenderness present. No tracheal deviation present.  Cardiovascular: Normal rate and regular rhythm.   Pulmonary/Chest: Effort normal and breath sounds normal. No stridor. No respiratory distress. She has no wheezes. She has no rales. She exhibits no tenderness.  Lymphadenopathy:    She has no cervical adenopathy.  Neurological: She is alert.  Skin: She is not diaphoretic.    ED Course  Procedures (including critical care time)  Labs Reviewed - No  data to display No results found.  12:03 AM Pt seen and examined, declines nebulizer treatment in ED.  States she is feeling better.  Agrees to prednisone.    1. Asthma attack       MDM  Pt with hx asthma presenting with typical asthma attack.  Reports improvement upon interview after sitting in air conditioning and using her home inhalers.  Lungs CTAB, moving air well in all fields.  Pt given prednisone in ED.  No fever, chills, or productive cough concerning for pneumonia.  O2 99% on room air.  Pt d/c home with prednisone, PCP follow up.  Discussed diagnosis and plan with patient.  Pt given return precautions.  Pt verbalizes  understanding and agrees with plan.           [place], [place] 08/26/11 [phone]

## 2011-08-27 NOTE — ED Provider Notes (Signed)
Medical screening examination/treatment/procedure(s) were performed by non-physician practitioner and as supervising physician I was immediately available for consultation/collaboration.  Virgel Manifold, MD 08/27/11 [phone]

## 2011-09-15 ENCOUNTER — Encounter (HOSPITAL_COMMUNITY): Payer: Self-pay

## 2011-09-15 ENCOUNTER — Emergency Department (INDEPENDENT_AMBULATORY_CARE_PROVIDER_SITE_OTHER)
Admission: EM | Admit: 2011-09-15 | Discharge: 2011-09-15 | Disposition: A | Discharge status: Home / Self Care | Payer: Self-pay | Source: Home / Self Care

## 2011-09-15 DIAGNOSIS — S39012A Strain of muscle, fascia and tendon of lower back, initial encounter: Secondary | ICD-10-CM

## 2011-09-15 DIAGNOSIS — S335XXA Sprain of ligaments of lumbar spine, initial encounter: Secondary | ICD-10-CM

## 2011-09-15 HISTORY — DX: Unspecified osteoarthritis, unspecified site: M19.90

## 2011-09-15 HISTORY — DX: Dorsalgia, unspecified: M54.9

## 2011-09-15 MED ORDER — KETOROLAC TROMETHAMINE 60 MG/2ML IM SOLN
INTRAMUSCULAR | Status: AC
  Filled 2011-09-15: qty 2

## 2011-09-15 MED ORDER — KETOROLAC TROMETHAMINE 60 MG/2ML IM SOLN
60.0000 mg | Freq: Once | INTRAMUSCULAR | Status: AC
  Administered 2011-09-15: 60 mg via INTRAMUSCULAR

## 2011-09-15 MED ORDER — CYCLOBENZAPRINE HCL 10 MG PO TABS: 5.0000 mg | ORAL_TABLET | Freq: Two times a day (BID) | ORAL | Status: AC | PRN

## 2011-09-15 NOTE — ED Provider Notes (Signed)
History     CSN: [id number]  Arrival date & time 09/15/11  1059   None     Chief Complaint  Patient presents with  . Back Pain    (Consider location/radiation/quality/duration/timing/severity/associated sxs/prior treatment) Patient is a 41 y.o. female presenting with back pain. The history is provided by the patient.  Back Pain  This is a recurrent problem. The current episode started 12 to 24 hours ago. The problem occurs constantly. The problem has been gradually worsening. The pain is associated with no known injury. The pain is present in the lumbar spine. The quality of the pain is described as stabbing. The pain radiates to the left thigh. The pain is at a severity of 5/10. The pain is moderate. The symptoms are aggravated by bending, twisting and certain positions. The pain is worse during the day. Stiffness is present all day. Pertinent negatives include no fever, no abdominal pain, no leg pain, no tingling and no weakness.    Past Medical History  Diagnosis Date  . Asthma   . Back pain   . Arthritis     Past Surgical History  Procedure Date  . Abdominal hysterectomy     Family History  Problem Relation Age of Onset  . Coronary artery disease Mother   . Diabetes Mother   . COPD Mother   . Coronary artery disease Father   . Hypertension Father     History  Substance Use Topics  . Smoking status: Current Everyday Smoker    Types: Cigarettes  . Smokeless tobacco: Not on file  . Alcohol Use: No    OB History    Grav Para Term Preterm Abortions TAB SAB Ect Mult Living                  Review of Systems  Constitutional: Positive for activity change. Negative for fever and fatigue.  Respiratory: Positive for wheezing. Negative for choking.   Cardiovascular: Negative for leg swelling.  Gastrointestinal: Negative for abdominal pain.  Genitourinary: Negative.   Musculoskeletal: Positive for back pain.  Neurological: Negative.  Negative for tingling and  weakness.    Allergies  Codeine  Home Medications   Current Outpatient Rx  Name Route Sig Dispense Refill  . ALBUTEROL SULFATE HFA 108 (90 BASE) MCG/ACT IN AERS Inhalation Inhale 2 puffs into the lungs every 6 (six) hours as needed. For shortness of breath and wheezing    . FLUTICASONE-SALMETEROL 100-50 MCG/DOSE IN AEPB Inhalation Inhale 1 puff into the lungs at bedtime.    . IBUPROFEN 200 MG PO TABS Oral Take 800 mg by mouth every 6 (six) hours as needed. For headache pain    . CYCLOBENZAPRINE HCL 10 MG PO TABS Oral Take 0.5 tablets (5 mg total) by mouth 2 (two) times daily as needed for muscle spasms. 20 tablet 0  . OVER THE COUNTER MEDICATION Oral Take 1 tablet by mouth 2 (two) times daily as needed. Otc. Sinus & Headache Medication      BP 125/71  Pulse 76  Temp 98.6 F (37 C) (Oral)  Resp 20  SpO2 98%  Physical Exam  Constitutional: She is oriented to person, place, and time. She appears well-developed and well-nourished.  Neck: Normal range of motion. Neck supple.  Cardiovascular: Normal rate and regular rhythm.   Pulmonary/Chest: Effort normal. No respiratory distress. She has wheezes. She has no rales.  Musculoskeletal:       Lumbar back: She exhibits decreased range of motion, tenderness and pain.  She exhibits no bony tenderness, no swelling, no edema, no deformity, no laceration and no spasm.  Neurological: She is alert and oriented to person, place, and time. She has normal reflexes.  Skin: Skin is warm and dry.    ED Course  Procedures (including critical care time)  Labs Reviewed - No data to display No results found.   1. Strain of lumbar paraspinal muscle       MDM  Lumbar strain without trauma or focal neuro findings. Treat conservatively , home exercises, heat and Flexeril        Janne Napoleon, NP 09/15/11 1456

## 2011-09-15 NOTE — ED Notes (Signed)
C/o low back for a month, states much worse in the last 2 weeks.  Hx of back injury 2 years ago and arthritis in back- states she does a lot of pulling, and lifting at work. 2 days ago she also noted the pain radiated to lt buttock area.

## 2011-09-17 NOTE — ED Provider Notes (Signed)
Medical screening examination/treatment/procedure(s) were performed by non-physician practitioner and as supervising physician I was immediately available for consultation/collaboration.  Burnett Kanaris, MD 09/17/11 [phone]

## 2012-01-22 ENCOUNTER — Other Ambulatory Visit (HOSPITAL_COMMUNITY): Payer: Self-pay | Admitting: Physician Assistant

## 2012-01-22 DIAGNOSIS — Z139 Encounter for screening, unspecified: Secondary | ICD-10-CM

## 2012-01-29 ENCOUNTER — Ambulatory Visit (HOSPITAL_COMMUNITY)
Admission: RE | Admit: 2012-01-29 | Discharge: 2012-01-29 | Disposition: A | Discharge status: Home / Self Care | Payer: Self-pay | Source: Ambulatory Visit | Attending: Physician Assistant | Admitting: Physician Assistant

## 2012-01-29 DIAGNOSIS — Z139 Encounter for screening, unspecified: Secondary | ICD-10-CM

## 2012-09-04 ENCOUNTER — Telehealth: Payer: Self-pay | Admitting: Family Medicine

## 2012-09-04 ENCOUNTER — Encounter (HOSPITAL_COMMUNITY): Payer: Self-pay

## 2012-09-04 ENCOUNTER — Emergency Department (INDEPENDENT_AMBULATORY_CARE_PROVIDER_SITE_OTHER)
Admission: EM | Admit: 2012-09-04 | Discharge: 2012-09-04 | Disposition: A | Discharge status: Home / Self Care | Payer: Commercial Managed Care - PPO | Source: Home / Self Care | Attending: Family Medicine | Admitting: Family Medicine

## 2012-09-04 ENCOUNTER — Emergency Department (INDEPENDENT_AMBULATORY_CARE_PROVIDER_SITE_OTHER): Payer: Commercial Managed Care - PPO

## 2012-09-04 DIAGNOSIS — M545 Low back pain: Secondary | ICD-10-CM

## 2012-09-04 MED ORDER — KETOROLAC TROMETHAMINE 30 MG/ML IJ SOLN
INTRAMUSCULAR | Status: AC
  Filled 2012-09-04: qty 1

## 2012-09-04 MED ORDER — METAXALONE 800 MG PO TABS: 800.0000 mg | ORAL_TABLET | Freq: Four times a day (QID) | ORAL | Status: DC

## 2012-09-04 MED ORDER — KETOROLAC TROMETHAMINE 10 MG PO TABS: 10.0000 mg | ORAL_TABLET | Freq: Four times a day (QID) | ORAL | Status: DC | PRN

## 2012-09-04 MED ORDER — KETOROLAC TROMETHAMINE 30 MG/ML IJ SOLN
30.0000 mg | Freq: Once | INTRAMUSCULAR | Status: AC
  Administered 2012-09-04: 30 mg via INTRAMUSCULAR

## 2012-09-04 NOTE — ED Provider Notes (Signed)
CSN: [id number]     Arrival date & time 09/04/12  1733 History     First MD Initiated Contact with Patient 09/04/12 1846     Chief Complaint  Patient presents with  . Back Pain   (Consider location/radiation/quality/duration/timing/severity/associated sxs/prior Treatment) Patient is a 42 y.o. female presenting with back pain. The history is provided by the patient.  Back Pain Location:  Lumbar spine and sacro-iliac joint Quality:  Stiffness and stabbing Stiffness is present:  In the morning Radiates to:  Does not radiate Pain severity:  Moderate Pain is:  Same all the time Progression:  Worsening Chronicity:  Recurrent Context: recent injury   Context comment:  Fell at [employer] but also lot of walking and drive back in car made it worse. Worsened by:  Palpation and movement Associated symptoms: no abdominal pain, no bladder incontinence, no bowel incontinence, no chest pain, no dysuria, no fever, no leg pain, no numbness, no paresthesias, no pelvic pain, no perianal numbness and no weakness   Risk factors: obesity   Risk factors comment:  H/o chronic back issues.   Past Medical History  Diagnosis Date  . Asthma   . Back pain   . Arthritis    Past Surgical History  Procedure Laterality Date  . Abdominal hysterectomy     Family History  Problem Relation Age of Onset  . Coronary artery disease Mother   . Diabetes Mother   . COPD Mother   . Coronary artery disease Father   . Hypertension Father    History  Substance Use Topics  . Smoking status: Current Every Day Smoker    Types: Cigarettes  . Smokeless tobacco: Not on file  . Alcohol Use: No   OB History   Grav Para Term Preterm Abortions TAB SAB Ect Mult Living                 Review of Systems  Constitutional: Negative for fever.  Cardiovascular: Negative for chest pain.  Gastrointestinal: Negative.  Negative for abdominal pain and bowel incontinence.  Genitourinary: Negative for bladder incontinence,  dysuria, urgency and pelvic pain.  Musculoskeletal: Positive for back pain. Negative for gait problem.  Skin: Negative.   Neurological: Negative for weakness, numbness and paresthesias.    Allergies  Codeine  Home Medications   Current Outpatient Rx  Name  Route  Sig  Dispense  Refill  . albuterol (PROVENTIL HFA;VENTOLIN HFA) 108 (90 BASE) MCG/ACT inhaler   Inhalation   Inhale 2 puffs into the lungs every 6 (six) hours as needed. For shortness of breath and wheezing         . Fluticasone-Salmeterol (ADVAIR) 100-50 MCG/DOSE AEPB   Inhalation   Inhale 1 puff into the lungs at bedtime.         Marland Kitchen ibuprofen (ADVIL,MOTRIN) 200 MG tablet   Oral   Take 800 mg by mouth every 6 (six) hours as needed. For headache pain         . ketorolac (TORADOL) 10 MG tablet   Oral   Take 1 tablet (10 mg total) by mouth every 6 (six) hours as needed for pain.   20 tablet   0   . metaxalone (SKELAXIN) 800 MG tablet   Oral   Take 1 tablet (800 mg total) by mouth 4 (four) times daily.   30 tablet   0   . OVER THE COUNTER MEDICATION   Oral   Take 1 tablet by mouth 2 (two) times daily as needed.  Otc. Sinus & Headache Medication          BP 105/76  Pulse 93  Temp(Src) 97.8 F (36.6 C) (Oral)  Resp 18  SpO2 97% Physical Exam  Nursing note and vitals reviewed. Constitutional: She is oriented to person, place, and time. She appears well-developed and well-nourished.  Abdominal: Soft. Bowel sounds are normal.  Musculoskeletal: She exhibits tenderness.       Lumbar back: She exhibits decreased range of motion, tenderness, bony tenderness, pain and spasm.       Back:  Neurological: She is alert and oriented to person, place, and time.  Skin: Skin is warm and dry.    ED Course   Procedures (including critical care time)  Labs Reviewed - No data to display Dg Lumbar Spine Complete  09/04/2012   *RADIOLOGY REPORT*  Clinical Data: Low back pain after fall on August 9.  LUMBAR SPINE -  COMPLETE 4+ VIEW  Comparison: 06/09/2009  Findings: No evidence of fracture.  There is mild disc space narrowing at L3-4, L4-5 and L5-S1.  There is mild facet degeneration at L4-5 and L5-S1.  No pars defect or slippage.  There is sacroiliac arthropathy on the right.  IMPRESSION: No acute or traumatic finding.  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   Original Report Authenticated By: Nelson Chimes, M.D.   1. Low back pain syndrome     MDM    Billy Fischer, MD 09/04/12 2013

## 2012-09-04 NOTE — Telephone Encounter (Signed)
Pt lost her ins and now has [facility] and would like to re-est with Dr Sarajane Jews. Can I sch?

## 2012-09-04 NOTE — Telephone Encounter (Signed)
No it has been too long. Have her see another provider, thanks

## 2012-09-04 NOTE — ED Notes (Signed)
Low back pain, off and on for several years, worse past couple of weeks; no injury , except [place]. Used left over flexaril from last time, but it only made her sleepy; some numbness in left hip/leg, no saddle anesthesia

## 2012-09-05 NOTE — Telephone Encounter (Signed)
See my note

## 2012-09-06 NOTE — Telephone Encounter (Signed)
Pt is aware.  

## 2016-08-04 DIAGNOSIS — T50905A Adverse effect of unspecified drugs, medicaments and biological substances, initial encounter: Secondary | ICD-10-CM | POA: Insufficient documentation

## 2016-09-30 DIAGNOSIS — J45909 Unspecified asthma, uncomplicated: Secondary | ICD-10-CM | POA: Insufficient documentation

## 2016-09-30 DIAGNOSIS — Z720 Tobacco use: Secondary | ICD-10-CM | POA: Insufficient documentation

## 2019-01-30 ENCOUNTER — Ambulatory Visit
Admission: EM | Admit: 2019-01-30 | Discharge: 2019-01-30 | Disposition: A | Discharge status: Home / Self Care | Payer: Commercial Managed Care - PPO

## 2019-01-30 DIAGNOSIS — J209 Acute bronchitis, unspecified: Secondary | ICD-10-CM

## 2019-01-30 DIAGNOSIS — Z20822 Contact with and (suspected) exposure to covid-19: Secondary | ICD-10-CM

## 2019-01-30 DIAGNOSIS — R05 Cough: Secondary | ICD-10-CM | POA: Diagnosis not present

## 2019-01-30 DIAGNOSIS — F17210 Nicotine dependence, cigarettes, uncomplicated: Secondary | ICD-10-CM | POA: Diagnosis not present

## 2019-01-30 DIAGNOSIS — R059 Cough, unspecified: Secondary | ICD-10-CM

## 2019-01-30 MED ORDER — PREDNISONE 50 MG PO TABS: 50.0000 mg | ORAL_TABLET | Freq: Every day | ORAL | 0 refills | Status: DC

## 2019-01-30 MED ORDER — DOXYCYCLINE HYCLATE 100 MG PO CAPS: 100.0000 mg | ORAL_CAPSULE | Freq: Two times a day (BID) | ORAL | 0 refills | Status: DC

## 2019-01-30 NOTE — ED Provider Notes (Signed)
[name]    CSN: [id number] Arrival date & time: 01/30/19  0850      History   Chief Complaint Chief Complaint  Patient presents with  . Cough    HPI Teresa Castaneda is a 49 y.o. female.   49 year old female comes in for 1.49-monthhistory of URI symptoms.  Has had nasal congestion, productive cough, chest tightness.  Has not had Covid testing.  Denies fever, chills, body aches.  Denies abdominal pain, nausea, vomiting, diarrhea.  Denies shortness of breath, loss of taste or smell.  She saw her provider, who treated her with prednisone and clarithromycin.  States had side effect/allergy to clarithromycin and self discontinued.  Prednisone 20 mg p.o. daily x10 days with some relief of symptoms.  Finished these medications 1 month ago.  Has continued symptoms for the past month without any worsening. Current every day smoker.     Past Medical History:  Diagnosis Date  . Arthritis   . Asthma   . Back pain     Patient Active Problem List   Diagnosis Date Noted  . ANXIETY 09/10/2006  . DEPRESSION 09/10/2006  . ASTHMA 09/10/2006  . HEADACHE 09/10/2006    Past Surgical History:  Procedure Laterality Date  . ABDOMINAL HYSTERECTOMY      OB History   No obstetric history on file.      Home Medications    Prior to Admission medications   Medication Sig Start Date End Date Taking? Authorizing Provider  fluticasone (FLOVENT HFA) 220 MCG/ACT inhaler Inhale into the lungs 2 (two) times daily.   Yes [provider]  montelukast (SINGULAIR) 10 MG tablet Take 10 mg by mouth at bedtime.   Yes [provider]  omeprazole (PRILOSEC) 20 MG capsule Take 20 mg by mouth daily.   Yes [provider]  thyroid (ARMOUR) 30 MG tablet Take 120 mg by mouth daily before breakfast.   Yes [provider]  albuterol (PROVENTIL HFA;VENTOLIN HFA) 108 (90 BASE) MCG/ACT inhaler Inhale 2 puffs into the lungs every 6 (six) hours as needed. For  shortness of breath and wheezing    [provider]  doxycycline (VIBRAMYCIN) 100 MG capsule Take 1 capsule (100 mg total) by mouth 2 (two) times daily. 01/30/19   YTasia Catchings [name] V, PA-C  Fluticasone-Salmeterol (ADVAIR) 100-50 MCG/DOSE AEPB Inhale 1 puff into the lungs at bedtime.    [provider]  ibuprofen (ADVIL,MOTRIN) 200 MG tablet Take 800 mg by mouth every 6 (six) hours as needed. For headache pain    [provider]  predniSONE (DELTASONE) 50 MG tablet Take 1 tablet (50 mg total) by mouth daily with breakfast. 01/30/19   [name] PA-C    Family History Family History  Problem Relation Age of Onset  . Coronary artery disease Mother   . Diabetes Mother   . COPD Mother   . Coronary artery disease Father   . Hypertension Father     Social History Social History   Tobacco Use  . Smoking status: Current Every Day Smoker    Types: Cigarettes  . Smokeless tobacco: Never Used  Substance Use Topics  . Alcohol use: No  . Drug use: No     Allergies   Clarithromycin and Codeine   Review of Systems Review of Systems  Reason unable to perform ROS: See HPI as above.     Physical Exam Triage Vital Signs ED Triage Vitals  Enc Vitals Group     BP  01/30/19 0933 138/88     Pulse Rate 01/30/19 0933 81     Resp 01/30/19 0933 18     Temp 01/30/19 0933 98.2 F (36.8 C)     Temp Source 01/30/19 0933 Oral     SpO2 01/30/19 0933 95 %     Weight --      Height --      Head Circumference --      Peak Flow --      Pain Score 01/30/19 0934 0     Pain Loc --      Pain Edu? --      Excl. in [place]? --    No data found.  Updated Vital Signs BP 138/88 (BP Location: Left Arm)   Pulse 81   Temp 98.2 F (36.8 C) (Oral)   Resp 18   SpO2 95%   Physical Exam Constitutional:      General: She is not in acute distress.    Appearance: Normal appearance. She is not ill-appearing, toxic-appearing or diaphoretic.  HENT:     Head: Normocephalic and atraumatic.      Nose:     Right Sinus: Maxillary sinus tenderness and frontal sinus tenderness present.     Left Sinus: Maxillary sinus tenderness and frontal sinus tenderness present.     Mouth/Throat:     Mouth: Mucous membranes are moist.     Pharynx: Oropharynx is clear. Uvula midline.  Cardiovascular:     Rate and Rhythm: Normal rate and regular rhythm.     Heart sounds: Normal heart sounds. No murmur. No friction rub. No gallop.   Pulmonary:     Effort: Pulmonary effort is normal. No accessory muscle usage, prolonged expiration, respiratory distress or retractions.     Comments: Lungs clear to auscultation without adventitious lung sounds. Musculoskeletal:     Cervical back: Normal range of motion and neck supple.  Neurological:     General: No focal deficit present.     Mental Status: She is alert and oriented to person, place, and time.      UC Treatments / Results  Labs (all labs ordered are listed, but only abnormal results are displayed) Labs Reviewed  NOVEL CORONAVIRUS, NAA    EKG   Radiology No results found.  Procedures Procedures (including critical care time)  Medications Ordered in UC Medications - No data to display  Initial Impression / Assessment and Plan / UC Course  I have reviewed the triage vital signs and the nursing notes.  Pertinent labs & imaging results that were available during my care of the patient were reviewed by me and considered in my medical decision making (see chart for details).    Covid test ordered.  Discussed this may not change treatment plan for patient, but will need Covid status evaluate exposures.  Will start patient on doxycycline, prednisone for sinusitis/bronchitis.  Other symptomatic treatment discussed.  Push fluids.  Smoking cessation discussed.  Return precautions given.  Patient expresses understanding and agrees to plan.  Final Clinical Impressions(s) / UC Diagnoses   Final diagnoses:  Cough  Acute bronchitis, unspecified  organism   ED Prescriptions    Medication Sig Dispense Auth. Provider   doxycycline (VIBRAMYCIN) 100 MG capsule Take 1 capsule (100 mg total) by mouth 2 (two) times daily. 20 capsule Daneisha Surges V, PA-C   predniSONE (DELTASONE) 50 MG tablet Take 1 tablet (50 mg total) by mouth daily with breakfast. 5 tablet Ok Edwards, PA-C  PDMP not reviewed this encounter.   Ok Edwards, PA-C 01/30/19 1220

## 2019-01-30 NOTE — Discharge Instructions (Signed)
COVID PCR testing ordered. I would like you to quarantine until testing results. Prednisone and doxycycline as directed. You can take over the counter flonase/nasacort to help with nasal congestion/drainage. Tylenol/motrin for pain and fever. Keep hydrated, urine should be clear to pale yellow in color. If experiencing shortness of breath, trouble breathing, go to the emergency department for further evaluation needed.

## 2019-01-30 NOTE — ED Triage Notes (Signed)
Pt c/o nasal congestion, productive cough and chest tightness for over a month and a half. 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 States the antibiotic (not sure name) made her body cramp so only took it for 4 days.

## 2019-01-31 LAB — NOVEL CORONAVIRUS, NAA: SARS-CoV-2, NAA: NOT DETECTED

## 2019-04-23 ENCOUNTER — Ambulatory Visit
Admission: EM | Admit: 2019-04-23 | Discharge: 2019-04-23 | Disposition: A | Discharge status: Home / Self Care | Payer: Commercial Managed Care - PPO

## 2019-04-23 DIAGNOSIS — R0981 Nasal congestion: Secondary | ICD-10-CM

## 2019-04-23 DIAGNOSIS — J3489 Other specified disorders of nose and nasal sinuses: Secondary | ICD-10-CM | POA: Diagnosis not present

## 2019-04-23 HISTORY — DX: Allergy, unspecified, initial encounter: T78.40XA

## 2019-04-23 MED ORDER — AZELASTINE HCL 0.1 % NA SOLN: 2.0000 | Freq: Two times a day (BID) | NASAL | 0 refills | Status: DC

## 2019-04-23 MED ORDER — DEXAMETHASONE SODIUM PHOSPHATE 10 MG/ML IJ SOLN
10.0000 mg | Freq: Once | INTRAMUSCULAR | Status: AC
  Administered 2019-04-23: 09:00:00 10 mg via INTRAMUSCULAR

## 2019-04-23 NOTE — ED Triage Notes (Signed)
Pt c/o allergies and sinus since Sunday./ pt c/o cough, nasal congestion, and sinus drainage.

## 2019-04-23 NOTE — ED Provider Notes (Signed)
[name]    CSN: [id number] Arrival date & time: 04/23/19  0818      History   Chief Complaint Chief Complaint  Patient presents with  . Nasal Congestion    HPI Teresa Castaneda is a 49 y.o. female.   49 year old female comes in for 4 day of URI symptoms.  Has had rhinorrhea, nasal congestion, sinus pressure, post nasal drip, cough. Denies fever, chills, body aches. Denies abdominal pain, diarrhea. Denies shortness of breath, loss of taste/smell. She has had similar symptoms intermittently for the past few months, and contributes this to her allergies. States she came in due to increased post nasal drip causing nausea/vomiting. She switched to xyzal a few months ago and takes daily. Does daily nasacort and singulair as well.      Past Medical History:  Diagnosis Date  . Allergies   . Arthritis   . Asthma   . Back pain     Patient Active Problem List   Diagnosis Date Noted  . ANXIETY 09/10/2006  . DEPRESSION 09/10/2006  . ASTHMA 09/10/2006  . HEADACHE 09/10/2006    Past Surgical History:  Procedure Laterality Date  . ABDOMINAL HYSTERECTOMY      OB History   No obstetric history on file.      Home Medications    Prior to Admission medications   Medication Sig Start Date End Date Taking? Authorizing Provider  escitalopram (LEXAPRO) 10 MG tablet Take 10 mg by mouth at bedtime.   Yes [provider]  levocetirizine (XYZAL) 5 MG tablet Take 5 mg by mouth every evening.   Yes [provider]  LORazepam (ATIVAN) 1 MG tablet Take 1 mg by mouth every 8 (eight) hours as needed for anxiety.   Yes [provider]  albuterol (PROVENTIL HFA;VENTOLIN HFA) 108 (90 BASE) MCG/ACT inhaler Inhale 2 puffs into the lungs every 6 (six) hours as needed. For shortness of breath and wheezing    [provider]  azelastine (ASTELIN) 0.1 % nasal spray Place 2 sprays into both nostrils 2 (two) times daily. 04/23/19   [name], Cala Kruckenberg V, PA-C    fluticasone (FLOVENT HFA) 220 MCG/ACT inhaler Inhale into the lungs 2 (two) times daily.    [provider]  ibuprofen (ADVIL,MOTRIN) 200 MG tablet Take 800 mg by mouth every 6 (six) hours as needed. For headache pain    [provider]  montelukast (SINGULAIR) 10 MG tablet Take 10 mg by mouth at bedtime.    [provider]  omeprazole (PRILOSEC) 20 MG capsule Take 20 mg by mouth daily.    [provider]  thyroid (ARMOUR) 30 MG tablet Take 120 mg by mouth daily before breakfast.    [provider]    Family History Family History  Problem Relation Age of Onset  . Coronary artery disease Mother   . Diabetes Mother   . COPD Mother   . Coronary artery disease Father   . Hypertension Father     Social History Social History   Tobacco Use  . Smoking status: Current Every Day Smoker    Types: Cigarettes  . Smokeless tobacco: Never Used  Substance Use Topics  . Alcohol use: No  . Drug use: No     Allergies   Clarithromycin and Codeine   Review of Systems Review of Systems  Reason unable to perform ROS: See HPI as above.     Physical Exam Triage Vital Signs ED Triage Vitals  Enc  Vitals Group     BP 04/23/19 0820 125/76     Pulse Rate 04/23/19 0820 75     Resp 04/23/19 0820 20     Temp 04/23/19 0820 97.9 F (36.6 C)     Temp Source 04/23/19 0820 Oral     SpO2 04/23/19 0820 94 %     Weight --      Height --      Head Circumference --      Peak Flow --      Pain Score 04/23/19 0825 1     Pain Loc --      Pain Edu? --      Excl. in [place]? --    No data found.  Updated Vital Signs BP 125/76 (BP Location: Left Arm)   Pulse 75   Temp 97.9 F (36.6 C) (Oral)   Resp 20   SpO2 94%   Physical Exam Constitutional:      General: She is not in acute distress.    Appearance: Normal appearance. She is not ill-appearing, toxic-appearing or diaphoretic.  HENT:     Head: Normocephalic and atraumatic.     Right Ear: Ear  canal and external ear normal. A middle ear effusion is present. Tympanic membrane is not erythematous or bulging.     Left Ear: Ear canal normal. A middle ear effusion is present. Tympanic membrane is not erythematous or bulging.     Nose:     Right Sinus: Maxillary sinus tenderness present. No frontal sinus tenderness.     Left Sinus: Maxillary sinus tenderness present. No frontal sinus tenderness.     Mouth/Throat:     Mouth: Mucous membranes are moist.     Pharynx: Oropharynx is clear. Uvula midline.  Eyes:     Extraocular Movements: Extraocular movements intact.     Conjunctiva/sclera: Conjunctivae normal.     Pupils: Pupils are equal, round, and reactive to light.  Cardiovascular:     Rate and Rhythm: Normal rate and regular rhythm.     Heart sounds: Normal heart sounds. No murmur. No friction rub. No gallop.   Pulmonary:     Effort: Pulmonary effort is normal. No accessory muscle usage, prolonged expiration, respiratory distress or retractions.     Comments: Lungs clear to auscultation without adventitious lung sounds. Musculoskeletal:     Cervical back: Normal range of motion and neck supple.  Neurological:     General: No focal deficit present.     Mental Status: She is alert and oriented to person, place, and time.      UC Treatments / Results  Labs (all labs ordered are listed, but only abnormal results are displayed) Labs Reviewed - No data to display  EKG   Radiology No results found.  Procedures Procedures (including critical care time)  Medications Ordered in UC Medications  dexamethasone (DECADRON) injection 10 mg (10 mg Intramuscular Given 04/23/19 0858)    Initial Impression / Assessment and Plan / UC Course  I have reviewed the triage vital signs and the nursing notes.  Pertinent labs & imaging results that were available during my care of the patient were reviewed by me and considered in my medical decision making (see chart for details).     Discussed cannot rule out COVID from exam, will provide drive through testing information if patient chooses to get tested. Will provide decadron injection of sinus pressure. Continue current medicines and add azelastine as directed. Patient may benefit from nasal saline rinses as well.  Otherwise, to follow up with PCP for further evaluation and management needed. Return precautions given.   Final Clinical Impressions(s) / UC Diagnoses   Final diagnoses:  Sinus pressure  Nasal congestion  Rhinorrhea   ED Prescriptions    Medication Sig Dispense Auth. Provider   azelastine (ASTELIN) 0.1 % nasal spray Place 2 sprays into both nostrils 2 (two) times daily. 30 mL Ok Edwards, PA-C     PDMP not reviewed this encounter.   Ok Edwards, PA-C 04/23/19 [phone]

## 2019-04-23 NOTE — Discharge Instructions (Signed)
As discussed, cannot rule out COVID causing symptoms. If you do decide to get tested, there is information for drive through site below. Decadron given in office today. Continue xyzal and nasacort. Start azelastine as directed. You can also do over the counter nasal saline rinse. If experiencing shortness of breath, trouble breathing, go to the emergency department for further evaluation needed.   To make appointment: You can text "COVID" to [phone] OR log on to [email or web address] If no smart phone or PC, you can call [phone] for assistance in setting up appointments.  COVID drive through sites:  [place]: [place]: [place]: [place]

## 2019-06-09 ENCOUNTER — Ambulatory Visit
Admission: EM | Admit: 2019-06-09 | Discharge: 2019-06-09 | Disposition: A | Discharge status: Home / Self Care | Payer: Commercial Managed Care - PPO | Attending: Physician Assistant | Admitting: Physician Assistant

## 2019-06-09 DIAGNOSIS — M545 Low back pain, unspecified: Secondary | ICD-10-CM

## 2019-06-09 HISTORY — DX: Spondylosis without myelopathy or radiculopathy, lumbar region: M47.816

## 2019-06-09 MED ORDER — METHYLPREDNISOLONE 4 MG PO TBPK: ORAL_TABLET | ORAL | 0 refills | Status: DC

## 2019-06-09 MED ORDER — KETOROLAC TROMETHAMINE 30 MG/ML IJ SOLN
30.0000 mg | Freq: Once | INTRAMUSCULAR | Status: AC
  Administered 2019-06-09: 30 mg via INTRAMUSCULAR

## 2019-06-09 MED ORDER — TIZANIDINE HCL 2 MG PO TABS: 2.0000 mg | ORAL_TABLET | Freq: Three times a day (TID) | ORAL | 0 refills | Status: DC | PRN

## 2019-06-09 NOTE — ED Provider Notes (Signed)
[name]    CSN: [id number] Arrival date & time: 06/09/19  1726      History   Chief Complaint Chief Complaint  Patient presents with  . Back Pain    HPI Teresa Castaneda is a 49 y.o. female.   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 Denies injury/trauma. Pain is to the mid lower back that radiates bilaterally and to the hips. Painful ambulation. Denies saddle anesthesia, loss of bladder or bowel control. Work requires frequent sitting and standing. Ibuprofen 800 TID without much relief.      Past Medical History:  Diagnosis Date  . Allergies   . Arthritis   . Asthma   . Back pain   . DJD (degenerative joint disease), lumbar     Patient Active Problem List   Diagnosis Date Noted  . ANXIETY 09/10/2006  . DEPRESSION 09/10/2006  . ASTHMA 09/10/2006  . HEADACHE 09/10/2006    Past Surgical History:  Procedure Laterality Date  . ABDOMINAL HYSTERECTOMY      OB History   No obstetric history on file.      Home Medications    Prior to Admission medications   Medication Sig Start Date End Date Taking? Authorizing Provider  albuterol (PROVENTIL HFA;VENTOLIN HFA) 108 (90 BASE) MCG/ACT inhaler Inhale 2 puffs into the lungs every 6 (six) hours as needed. For shortness of breath and wheezing    [provider]  azelastine (ASTELIN) 0.1 % nasal spray Place 2 sprays into both nostrils 2 (two) times daily. 04/23/19   [name], Yurem Viner V, PA-C  escitalopram (LEXAPRO) 10 MG tablet Take 10 mg by mouth at bedtime.    [provider]  fluticasone (FLOVENT HFA) 220 MCG/ACT inhaler Inhale into the lungs 2 (two) times daily.    [provider]  ibuprofen (ADVIL,MOTRIN) 200 MG tablet Take 800 mg by mouth every 6 (six) hours as needed. For headache pain    [provider]  levocetirizine (XYZAL) 5 MG tablet Take 5 mg by mouth every evening.    [provider]    LORazepam (ATIVAN) 1 MG tablet Take 1 mg by mouth every 8 (eight) hours as needed for anxiety.    [provider]  methylPREDNISolone (MEDROL DOSEPAK) 4 MG TBPK tablet Follow pack instruction 06/09/19   [name], Mafalda Mcginniss V, PA-C  montelukast (SINGULAIR) 10 MG tablet Take 10 mg by mouth at bedtime.    [provider]  omeprazole (PRILOSEC) 20 MG capsule Take 20 mg by mouth daily.    [provider]  thyroid (ARMOUR) 30 MG tablet Take 120 mg by mouth daily before breakfast.    [provider]  tiZANidine (ZANAFLEX) 2 MG tablet Take 1 tablet (2 mg total) by mouth every 8 (eight) hours as needed for muscle spasms. 06/09/19   Ok Edwards, PA-C    Family History Family History  Problem Relation Age of Onset  . Coronary artery disease Mother   . Diabetes Mother   . COPD Mother   . Coronary artery disease Father   . Hypertension Father     Social History Social History   Tobacco Use  . Smoking status: Current Every Day Smoker    Types: Cigarettes  . Smokeless tobacco: Never Used  Substance Use Topics  . Alcohol use: No  . Drug use: No     Allergies   Clarithromycin and Codeine  Review of Systems Review of Systems  Reason unable to perform ROS: See HPI as above.     Physical Exam Triage Vital Signs ED Triage Vitals  Enc Vitals Group     BP      Pulse      Resp      Temp      Temp src      SpO2      Weight      Height      Head Circumference      Peak Flow      Pain Score      Pain Loc      Pain Edu?      Excl. in [place]?    No data found.  Updated Vital Signs BP (!) 147/84 (BP Location: Left Arm)   Pulse 97   Temp 98 F (36.7 C) (Oral)   Resp 18   SpO2 95%   Physical Exam Constitutional:      General: She is not in acute distress.    Appearance: Normal appearance. She is well-developed. She is not toxic-appearing or diaphoretic.  HENT:     Head: Normocephalic and atraumatic.  Eyes:     Conjunctiva/sclera: Conjunctivae normal.      Pupils: Pupils are equal, round, and reactive to light.  Pulmonary:     Effort: Pulmonary effort is normal. No respiratory distress.     Comments: Speaking in full sentences without difficulty Musculoskeletal:     Cervical back: Normal range of motion and neck supple.     Comments: No rashes, erythema, warmth. Tenderness to palpation of L4-L5 midline and bilaterally. Decreased ROM of back and hips due to pain. Straight leg raise deferred due to pain. Able to ambulate on own, though with pain.   BLLE 1+ pitting edema, no erythema, warmth. No calf pain  Skin:    General: Skin is warm and dry.  Neurological:     Mental Status: She is alert and oriented to person, place, and time.      UC Treatments / Results  Labs (all labs ordered are listed, but only abnormal results are displayed) Labs Reviewed - No data to display  EKG   Radiology No results found.  Procedures Procedures (including critical care time)  Medications Ordered in UC Medications  ketorolac (TORADOL) 30 MG/ML injection 30 mg (has no administration in time range)    Initial Impression / Assessment and Plan / UC Course  I have reviewed the triage vital signs and the nursing notes.  Pertinent labs & imaging results that were available during my care of the patient were reviewed by me and considered in my medical decision making (see chart for details).    toradol injection in office today. Medrol pack, tizanidine. Return precautions given. Patient expresses understanding and agrees to plan.  Final Clinical Impressions(s) / UC Diagnoses   Final diagnoses:  Acute bilateral low back pain without sciatica   ED Prescriptions    Medication Sig Dispense Auth. Provider   methylPREDNISolone (MEDROL DOSEPAK) 4 MG TBPK tablet Follow pack instruction 21 tablet Arnaldo Heffron V, PA-C   tiZANidine (ZANAFLEX) 2 MG tablet Take 1 tablet (2 mg total) by mouth every 8 (eight) hours as needed for muscle spasms. 15 tablet Ok Edwards,  PA-C     I have reviewed the PDMP during this encounter.   Ok Edwards, PA-C 06/09/19 1757

## 2019-06-09 NOTE — ED Triage Notes (Signed)
Pt c/o chronic lower back pain and bilateral hip pain that has worse in past 2 days. Pt c/o pain with swelling to rt leg. Denies injury. States at work she is up and down from sitting and standing.

## 2019-06-09 NOTE — Discharge Instructions (Signed)
Toradol injection in office today. Medrol pack as directed. tizanidine as needed, this can make you drowsy, so do not take if you are going to drive, operate heavy machinery, or make important decisions. Ice/heat compresses as needed. Follow up with PCP/sports medicine if symptoms worsen, changes for reevaluation. If experience numbness/tingling of the inner thighs, loss of bladder or bowel control, go to the emergency department for evaluation.

## 2019-06-17 ENCOUNTER — Ambulatory Visit: Payer: Commercial Managed Care - PPO | Admitting: Sports Medicine

## 2019-06-17 ENCOUNTER — Other Ambulatory Visit: Payer: Self-pay

## 2019-06-17 ENCOUNTER — Ambulatory Visit
Admission: RE | Admit: 2019-06-17 | Discharge: 2019-06-17 | Disposition: A | Discharge status: Home / Self Care | Payer: Commercial Managed Care - PPO | Source: Ambulatory Visit | Attending: Sports Medicine | Admitting: Sports Medicine

## 2019-06-17 VITALS — BP 119/81 | Ht 66.5 in | Wt 270.0 lb

## 2019-06-17 DIAGNOSIS — M47816 Spondylosis without myelopathy or radiculopathy, lumbar region: Secondary | ICD-10-CM

## 2019-06-17 MED ORDER — KETOROLAC TROMETHAMINE 60 MG/2ML IM SOLN
60.0000 mg | Freq: Once | INTRAMUSCULAR | Status: AC
  Administered 2019-06-17: 60 mg via INTRAMUSCULAR

## 2019-06-17 MED ORDER — DICLOFENAC SODIUM 75 MG PO TBEC: DELAYED_RELEASE_TABLET | ORAL | 0 refills | Status: DC

## 2019-06-18 ENCOUNTER — Other Ambulatory Visit: Payer: Self-pay

## 2019-06-18 DIAGNOSIS — M47816 Spondylosis without myelopathy or radiculopathy, lumbar region: Secondary | ICD-10-CM

## 2019-06-18 NOTE — Progress Notes (Signed)
   Subjective:    Patient ID: Teresa Castaneda, female    DOB: Jun 13, 1970, 49 y.o.   MRN: [id number]  HPI chief complaint: Low back pain  49 year old female comes in today complaining of chronic low back pain which has become acutely worse over the past several weeks.  She was previously seen at [facility] after an x-ray and MRI showed degenerative disc disease and facet arthropathy.  She was told that no surgery was needed at that time.  Since then, she has had chronic pain that she localizes to the lower lumbar spine which will radiate into the posterior aspect of both hips.  Her pain is most noticeable with going from a sitting to standing position.  Also pain with prolonged standing and walking.  In the past she has used pain patches and applied heat which have been minimally helpful.  She was also on 800 mg of ibuprofen as needed which seem to help up until recently.  Now she is only taking Tylenol arthritis.  Her pain was severe enough recently that she was seen at a local urgent care.  He was injected with 30 mg of Toradol and put on a 6-day Sterapred Dosepak.  The Toradol injection was slightly helpful.  She did not notice any improvement from the [employer].  No prior lumbar spine surgeries.  Pain does not radiate into her legs.  No associated numbness or tingling.  Past medical history reviewed Medications reviewed Allergies reviewed    Review of Systems    As above Objective:   Physical Exam  Well-developed, well-nourished.  No acute distress.  Awake alert and oriented x3.  Vital signs reviewed  Lumbar spine: Patient has limited lumbar range of motion secondary to pain.  She is diffusely tender to palpation along the lower lumbar spine as well as over the SI joints.  Negative straight leg raise bilaterally.  Reflexes are brisk and equal at the Achilles and patellar tendons bilaterally.  No atrophy.  Strength is grossly intact in both lower extremities.  X-rays of  her lumbar spine including AP and lateral views are reviewed.  She has loss of the normal lumbar lordosis secondary to spasm.  She has some mild disc space narrowing at multiple levels but nothing severe.  Mild facet arthropathy as well but again nothing severe.  Also seen is right SI joint periarticular sclerosis which is consistent with probable osteoarthritis here.  These x-rays are compared to lumbar spine films from August 2014 and are overall unchanged.      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  Since ibuprofen is no longer effective, we will try Voltaren 75 mg twice daily with food as needed.  I had originally contemplated getting an MRI of her lumbar spine but after reviewing her x-rays I think we should proceed with a diagnostic/therapeutic right SI joint injection.  Patient is in agreement with that plan.  I will schedule this to be done at [facility] imaging and the patient will follow up with me 2 weeks later.

## 2019-06-19 ENCOUNTER — Other Ambulatory Visit: Payer: Self-pay | Admitting: Sports Medicine

## 2019-06-19 DIAGNOSIS — M47816 Spondylosis without myelopathy or radiculopathy, lumbar region: Secondary | ICD-10-CM

## 2019-06-30 ENCOUNTER — Other Ambulatory Visit: Payer: Self-pay

## 2019-06-30 ENCOUNTER — Ambulatory Visit
Admission: RE | Admit: 2019-06-30 | Discharge: 2019-06-30 | Disposition: A | Discharge status: Home / Self Care | Payer: Commercial Managed Care - PPO | Source: Ambulatory Visit | Attending: Sports Medicine | Admitting: Sports Medicine

## 2019-06-30 DIAGNOSIS — M47816 Spondylosis without myelopathy or radiculopathy, lumbar region: Secondary | ICD-10-CM

## 2019-06-30 MED ORDER — IOPAMIDOL (ISOVUE-M 200) INJECTION 41%
1.0000 mL | Freq: Once | INTRAMUSCULAR | Status: AC
  Administered 2019-06-30: 1 mL via INTRA_ARTICULAR

## 2019-06-30 MED ORDER — METHYLPREDNISOLONE ACETATE 40 MG/ML INJ SUSP (RADIOLOG
120.0000 mg | Freq: Once | INTRAMUSCULAR | Status: AC
  Administered 2019-06-30: 120 mg via INTRA_ARTICULAR

## 2019-06-30 NOTE — Discharge Instructions (Signed)

## 2019-07-01 ENCOUNTER — Ambulatory Visit: Payer: Commercial Managed Care - PPO | Admitting: Sports Medicine

## 2019-07-22 ENCOUNTER — Ambulatory Visit (INDEPENDENT_AMBULATORY_CARE_PROVIDER_SITE_OTHER): Payer: Commercial Managed Care - PPO | Admitting: Sports Medicine

## 2019-07-22 ENCOUNTER — Other Ambulatory Visit: Payer: Self-pay

## 2019-07-22 VITALS — BP 115/69 | Ht 66.5 in | Wt 270.0 lb

## 2019-07-22 DIAGNOSIS — Z6841 Body Mass Index (BMI) 40.0 and over, adult: Secondary | ICD-10-CM | POA: Diagnosis not present

## 2019-07-22 DIAGNOSIS — M47816 Spondylosis without myelopathy or radiculopathy, lumbar region: Secondary | ICD-10-CM

## 2019-07-22 DIAGNOSIS — M461 Sacroiliitis, not elsewhere classified: Secondary | ICD-10-CM

## 2019-07-22 MED ORDER — LIDOCAINE 5 % EX PTCH: 1.0000 | MEDICATED_PATCH | CUTANEOUS | 1 refills | Status: DC

## 2019-07-22 NOTE — Progress Notes (Signed)
Teresa Castaneda - 49 y.o. female MRN [id number]  Date of birth: 06-30-1970    SUBJECTIVE:    Pleasant patient presents for 2-week follow-up for degenerative disc disease of lumbar region.  Chief Complaint:/ HPI:  Teresa Castaneda is a 49 y.o. female.  She was seen at Urgent Care on 06/09/2019 with the following concerns: history of lumbar DDD, 2 days of acute on chronic back/hip pain. Denies injury/trauma. Pain is to the mid lower back that radiates bilaterally and to the hips. Painful ambulation. Denies saddle anesthesia, loss of bladder or bowel control. Work requires frequent sitting and standing. Ibuprofen 800 TID without much relief.  Physical exam showed "No rashes, erythema, warmth. Tenderness to palpation of L4-L5 midline and bilaterally. Decreased ROM of back and hips due to pain. Straight leg raise deferred due to pain. Able to ambulate on own, though with pain. BLLE 1+ pitting edema, no erythema, warmth. No calf pain".  Was given methylprednisolone and tizanidine.  Was seen in sports medicine office 06/17/2019: chronic low back pain which has become acutely worse over the past several weeks.  She was previously seen at [facility] after an x-ray and MRI showed degenerative disc disease and facet arthropathy.  Has had chronic pain that she localizes to the lower lumbar spine, radiates to posterior aspect of both hips. Her pain is most noticeable with going from a sitting to standing position, prolonged standing/walking. In the past she has used pain patches and applied heat which have been minimally helpful.  She was also on 800 mg of ibuprofen as needed which seem to help up until recently.  Now she is only taking Tylenol arthritis. No prior lumbar spine surgeries.  Pain does not radiate into her legs, no associated numbness or tingling.  Was injected with Toradol IM 60 mg, Voltaren 75 mg twice daily with food as needed.  Diagnostic/therapeutic right SI joint injection to be scheduled  with [facility] imaging, follow-up 2 weeks.    Today (07/22/2019) she reports that her low back pain is improved after receiving the Si joint shot. Reports her pain has decreased from 10/10 down to ~4/10. She does report some new cramping that starts in her ankle and makes its way up her leg into the calf, thigh and then into her abdomen. The cramping often occurs at night time and wakes her up. She believes it is due to the Voltaren tablet, however she continues to take it as instructed. She denies any loss of bladder, bowel function.    ROS:     See HPI  PERTINENT  PMH / PSH FH / / SH:  Past Medical, Surgical, Social, and Family History Reviewed & Updated in the EMR.  Pertinent findings include:  Patient Active Problem List   Diagnosis Date Noted  . BMI 40.0-44.9, adult ([place]) 07/22/2019  . Facet arthropathy, lumbar 07/22/2019  . ANXIETY 09/10/2006  . DEPRESSION 09/10/2006  . ASTHMA 09/10/2006  . HEADACHE 09/10/2006     OBJECTIVE: There were no vitals taken for this visit.  Physical Exam:  Vital signs are reviewed.  GEN: Alert and oriented, NAD Pulm: Breathing unlabored PSY: normal mood, congruent affect MSK: Low back/Posterior pelvis: no gross deformity appreciated on inspection; severe TTP of sacroiliac joints, unable to perform lower extremity physical exam due to discomfort/pain  ASSESSMENT & PLAN:  1. Sacroiliitis:  -Apply Lidoderm patch to lower back daily. Do not apply heat over the same area where Lidoderm patch is located. -Stop taking Voltaren and see  if the cramping goes away.  -Follow up with [place] as needed for pains -Can call if desires another SI joint injection in late summer, early fall    Milus Banister, [place], PGY-3 07/22/2019 2:18 PM   Patient seen and evaluated with the resident.  I agree with the above plan of care.  Patient did receive benefit from a recent diagnostic/therapeutic SI joint injection.  She will discontinue her oral  Voltaren and will instead try Lidoderm patches.  She tells me that she has had good success with Lidoderm patches in the past.  At this point, I recommended that we simply see how things progress.  This is a chronic issue for her with no easy treatment.  We could consider a repeat SI joint injection in a few months if needed.  Patient is currently not interested in surgical options.  Follow-up as needed.

## 2019-07-22 NOTE — Patient Instructions (Addendum)
Thank you for coming in to see [place] today! Please see below to review our plan for today's visit:  1. Apply Lidoderm patch to lower back daily. Do not apply heat over the same area where Lidoderm patch is located. 2. Stop taking Voltaren and see if the cramping goes away.  3. Follow up with [place] as needed for pains.   Please call the clinic at [phone] if your symptoms worsen or you have any concerns. It was our pleasure to serve you!   Dr. Everlean Alstrom Dr. Milus Banister [facility]

## 2019-07-28 ENCOUNTER — Ambulatory Visit
Admission: EM | Admit: 2019-07-28 | Discharge: 2019-07-28 | Disposition: A | Discharge status: Home / Self Care | Payer: Commercial Managed Care - PPO | Attending: Emergency Medicine | Admitting: Emergency Medicine

## 2019-07-28 ENCOUNTER — Encounter: Payer: Self-pay | Admitting: Emergency Medicine

## 2019-07-28 ENCOUNTER — Other Ambulatory Visit: Payer: Self-pay

## 2019-07-28 DIAGNOSIS — R197 Diarrhea, unspecified: Secondary | ICD-10-CM

## 2019-07-28 DIAGNOSIS — R1011 Right upper quadrant pain: Secondary | ICD-10-CM

## 2019-07-28 LAB — COMPREHENSIVE METABOLIC PANEL
ALT: 58 IU/L — ABNORMAL HIGH (ref 0–32)
AST: 41 IU/L — ABNORMAL HIGH (ref 0–40)
Albumin/Globulin Ratio: 1.3 (ref 1.2–2.2)
Albumin: 3.8 g/dL (ref 3.8–4.8)
Alkaline Phosphatase: 99 IU/L (ref 48–121)
BUN/Creatinine Ratio: 15 (ref 9–23)
BUN: 11 mg/dL (ref 6–24)
Bilirubin Total: 0.4 mg/dL (ref 0.0–1.2)
CO2: 24 mmol/L (ref 20–29)
Calcium: 9.1 mg/dL (ref 8.7–10.2)
Chloride: 104 mmol/L (ref 96–106)
Creatinine, Ser: 0.75 mg/dL (ref 0.57–1.00)
GFR calc Af Amer: 110 mL/min/{1.73_m2} (ref >59)
GFR calc non Af Amer: 95 mL/min/{1.73_m2} (ref >59)
Globulin, Total: 3 g/dL (ref 1.5–4.5)
Glucose: 104 mg/dL — ABNORMAL HIGH (ref 65–99)
Potassium: 4.1 mmol/L (ref 3.5–5.2)
Sodium: 141 mmol/L (ref 134–144)
Total Protein: 6.8 g/dL (ref 6.0–8.5)

## 2019-07-28 LAB — CBC WITH DIFFERENTIAL/PLATELET
Basophils Absolute: 0 10*3/uL (ref 0.0–0.2)
Basos: 0 %
EOS (ABSOLUTE): 0.4 10*3/uL (ref 0.0–0.4)
Eos: 5 %
Hematocrit: 41.5 % (ref 34.0–46.6)
Hemoglobin: 14.4 g/dL (ref 11.1–15.9)
Immature Grans (Abs): 0 10*3/uL (ref 0.0–0.1)
Immature Granulocytes: 0 %
Lymphocytes Absolute: 3.5 10*3/uL — ABNORMAL HIGH (ref 0.7–3.1)
Lymphs: 39 %
MCH: 30.1 pg (ref 26.6–33.0)
MCHC: 34.7 g/dL (ref 31.5–35.7)
MCV: 87 fL (ref 79–97)
Monocytes Absolute: 0.6 10*3/uL (ref 0.1–0.9)
Monocytes: 7 %
Neutrophils Absolute: 4.4 10*3/uL (ref 1.4–7.0)
Neutrophils: 49 %
Platelets: 129 10*3/uL — ABNORMAL LOW (ref 150–450)
RBC: 4.79 x10E6/uL (ref 3.77–5.28)
RDW: 12.9 % (ref 11.7–15.4)
WBC: 9 10*3/uL (ref 3.4–10.8)

## 2019-07-28 LAB — AMYLASE: Amylase: 43 U/L (ref 31–110)

## 2019-07-28 LAB — LIPASE: Lipase: 50 U/L (ref 14–72)

## 2019-07-28 NOTE — ED Notes (Signed)
Patient able to ambulate independently  

## 2019-07-28 NOTE — Discharge Instructions (Signed)
Blood work pending: we will call you later tonight with results. Go to ER for worsening pain, vomiting, blood/black in stools, fever.

## 2019-07-28 NOTE — ED Triage Notes (Signed)
Pt presents to [facility] for assessment of 2 days of upper abdominal pain and diarrhea.  Denies blood, denies fevers.  C/o feeling hot and sweaty prior to bowel movements.

## 2019-07-28 NOTE — ED Provider Notes (Signed)
[name]    CSN: [id number] Arrival date & time: 07/28/19  1356      History   Chief Complaint Chief Complaint  Patient presents with  . Abdominal Pain  . Diarrhea    HPI Teresa Castaneda is a 49 y.o. female with history of obesity, asthma, allergies, lumbar DDD presenting for 2-day course of generalized abdominal pain and loose stools.  Patient denies hematochezia, melena, nausea, vomiting, fevers.  No change in diet, lifestyle, medications.  Patient states that she missed work today due to this: Requesting work note.  Patient does feel somewhat ill prior to bowel movements, though this resolves after bowel movement.  Denies hernia, hemorrhoids.  Denies history of heavy alcohol use, does smoke.  No chest pain, palpitations, shortness of breath.  Is s/p vaginal total hysterectomy.   Past Medical History:  Diagnosis Date  . Allergies   . Arthritis   . Asthma   . Back pain   . DJD (degenerative joint disease), lumbar     Patient Active Problem List   Diagnosis Date Noted  . BMI 40.0-44.9, adult ([place]) 07/22/2019  . Facet arthropathy, lumbar 07/22/2019  . Sacroiliitis ([place]) 07/22/2019  . ANXIETY 09/10/2006  . DEPRESSION 09/10/2006  . ASTHMA 09/10/2006  . HEADACHE 09/10/2006    Past Surgical History:  Procedure Laterality Date  . ABDOMINAL HYSTERECTOMY      OB History   No obstetric history on file.      Home Medications    Prior to Admission medications   Medication Sig Start Date End Date Taking? Authorizing Provider  albuterol (PROVENTIL HFA;VENTOLIN HFA) 108 (90 BASE) MCG/ACT inhaler Inhale 2 puffs into the lungs every 6 (six) hours as needed. For shortness of breath and wheezing    [provider]  azelastine (ASTELIN) 0.1 % nasal spray Place 2 sprays into both nostrils 2 (two) times daily. 04/23/19   Ok Edwards, PA-C  diclofenac (VOLTAREN) 75 MG EC tablet Take one tab twice daily with food 06/17/19   Lilia Argue R, DO  escitalopram  (LEXAPRO) 10 MG tablet Take 10 mg by mouth at bedtime.    [provider]  fluticasone (FLOVENT HFA) 220 MCG/ACT inhaler Inhale into the lungs 2 (two) times daily.    [provider]  ibuprofen (ADVIL,MOTRIN) 200 MG tablet Take 800 mg by mouth every 6 (six) hours as needed. For headache pain    [provider]  levocetirizine (XYZAL) 5 MG tablet Take 5 mg by mouth every evening.    [provider]  lidocaine (LIDODERM) 5 % Place 1 patch onto the skin daily. Remove & Discard patch within 12 hours or as directed by MD 07/22/19   Milus Banister C, DO  LORazepam (ATIVAN) 1 MG tablet Take 1 mg by mouth every 8 (eight) hours as needed for anxiety.    [provider]  methylPREDNISolone (MEDROL DOSEPAK) 4 MG TBPK tablet Follow pack instruction 06/09/19   [name], Amy V, PA-C  montelukast (SINGULAIR) 10 MG tablet Take 10 mg by mouth at bedtime.    [provider]  omeprazole (PRILOSEC) 20 MG capsule Take 20 mg by mouth daily.    [provider]  thyroid (ARMOUR) 30 MG tablet Take 120 mg by mouth daily before breakfast.    [provider]  tiZANidine (ZANAFLEX) 2 MG tablet Take 1 tablet (2 mg total) by mouth every 8 (eight) hours as needed for muscle spasms. 06/09/19   Ok Edwards, PA-C  Family History Family History  Problem Relation Age of Onset  . Coronary artery disease Mother   . Diabetes Mother   . COPD Mother   . Coronary artery disease Father   . Hypertension Father     Social History Social History   Tobacco Use  . Smoking status: Current Every Day Smoker    Types: Cigarettes  . Smokeless tobacco: Never Used  Substance Use Topics  . Alcohol use: No  . Drug use: No     Allergies   Clarithromycin and Codeine   Review of Systems As per HPI   Physical Exam Triage Vital Signs ED Triage Vitals [07/28/19 1401]  Enc Vitals Group     BP 103/68     Pulse Rate 86     Resp 18     Temp 98.3 F (36.8 C)     Temp  Source Oral     SpO2 93 %     Weight      Height      Head Circumference      Peak Flow      Pain Score 3     Pain Loc      Pain Edu?      Excl. in [place]?    No data found.  Updated Vital Signs BP 103/68 (BP Location: Left Arm)   Pulse 86   Temp 98.3 F (36.8 C) (Oral)   Resp 18   SpO2 93%   Visual Acuity Right Eye Distance:   Left Eye Distance:   Bilateral Distance:    Right Eye Near:   Left Eye Near:    Bilateral Near:     Physical Exam Constitutional:      General: She is not in acute distress.    Appearance: She is well-developed. She is obese. She is not toxic-appearing.  HENT:     Head: Normocephalic and atraumatic.  Eyes:     General: No scleral icterus.    Pupils: Pupils are equal, round, and reactive to light.  Cardiovascular:     Rate and Rhythm: Normal rate and regular rhythm.  Pulmonary:     Effort: Pulmonary effort is normal. No respiratory distress.     Breath sounds: No wheezing or rales.  Abdominal:     General: Bowel sounds are normal. There is no distension or abdominal bruit.     Palpations: Abdomen is soft. There is no hepatomegaly or splenomegaly.     Tenderness: There is abdominal tenderness in the right upper quadrant. There is no right CVA tenderness or guarding. Positive signs include Murphy's sign. Negative signs include Rovsing's sign and McBurney's sign.     Hernia: No hernia is present.  Skin:    Coloration: Skin is not jaundiced or pale.  Neurological:     Mental Status: She is alert and oriented to person, place, and time.      UC Treatments / Results  Labs (all labs ordered are listed, but only abnormal results are displayed) Labs Reviewed  CBC WITH DIFFERENTIAL/PLATELET  COMPREHENSIVE METABOLIC PANEL  LIPASE  AMYLASE    EKG   Radiology No results found.  Procedures Procedures (including critical care time)  Medications Ordered in UC Medications - No data to display  Initial Impression / Assessment and Plan /  UC Course  I have reviewed the triage vital signs and the nursing notes.  Pertinent labs & imaging results that were available during my care of the patient were reviewed by me and considered in  my medical decision making (see chart for details).     Patient febrile, nontoxic in office today.  Patient feels this could be due to "GI bug".  Given positive Murphy sign, will obtain basic labs as outlined above.  Confirmed phone number with patient prior to discharge.  We will treat supportively as discussed at appointment with soft/clear diet, OTC analgesia, heating pads.  Return precautions discussed, patient verbalized understanding and is agreeable to plan. Final Clinical Impressions(s) / UC Diagnoses   Final diagnoses:  Right upper quadrant abdominal pain  Diarrhea, unspecified type     Discharge Instructions     Blood work pending: we will call you later tonight with results. Go to ER for worsening pain, vomiting, blood/black in stools, fever.    ED Prescriptions    None     PDMP not reviewed this encounter.   Hall[place]Potvin, [place], [place] 07/28/19 1514

## 2019-07-29 ENCOUNTER — Telehealth (HOSPITAL_COMMUNITY): Payer: Self-pay | Admitting: Emergency Medicine

## 2019-07-29 NOTE — Telephone Encounter (Signed)
Spoke to pt about lab results and she verbalized understanding and plan of care

## 2019-09-01 ENCOUNTER — Other Ambulatory Visit: Payer: Self-pay

## 2019-09-01 ENCOUNTER — Ambulatory Visit
Admission: EM | Admit: 2019-09-01 | Discharge: 2019-09-01 | Disposition: A | Discharge status: Home / Self Care | Payer: Commercial Managed Care - PPO | Attending: Physician Assistant | Admitting: Physician Assistant

## 2019-09-01 DIAGNOSIS — J029 Acute pharyngitis, unspecified: Secondary | ICD-10-CM

## 2019-09-01 DIAGNOSIS — Z1152 Encounter for screening for COVID-19: Secondary | ICD-10-CM | POA: Diagnosis not present

## 2019-09-01 DIAGNOSIS — R059 Cough, unspecified: Secondary | ICD-10-CM

## 2019-09-01 DIAGNOSIS — R0981 Nasal congestion: Secondary | ICD-10-CM

## 2019-09-01 DIAGNOSIS — R05 Cough: Secondary | ICD-10-CM

## 2019-09-01 MED ORDER — BENZONATATE 200 MG PO CAPS: 200.0000 mg | ORAL_CAPSULE | Freq: Three times a day (TID) | ORAL | 0 refills | Status: DC

## 2019-09-01 MED ORDER — ALBUTEROL SULFATE HFA 108 (90 BASE) MCG/ACT IN AERS: 2.0000 | INHALATION_SPRAY | Freq: Four times a day (QID) | RESPIRATORY_TRACT | 0 refills | Status: DC | PRN

## 2019-09-01 NOTE — Discharge Instructions (Addendum)
COVID PCR testing ordered. I would like you to quarantine until testing results. Tessalon for cough. Continue azelastine, nasacort. Keep hydrated, urine should be clear to pale yellow in color. If experiencing shortness of breath, trouble breathing, go to the emergency department for further evaluation needed.

## 2019-09-01 NOTE — ED Triage Notes (Signed)
Pt c/o nasal congestion, dry cough, sore throat, and fever since Saturday. States is fully covid vaccinated.

## 2019-09-01 NOTE — ED Provider Notes (Signed)
[name]    CSN: [id number] Arrival date & time: 09/01/19  0813      History   Chief Complaint Chief Complaint  Patient presents with  . Nasal Congestion    HPI Teresa Castaneda is a 49 y.o. female.   49 year old female comes in for 3 day of URI symptoms. Cough, nasal congestion, sore throat, body aches, chills. Fever, tmax 100.4, responsive to antipyretic. Denies abdominal pain, nausea, vomiting, diarrhea. Loss of smell, but feels to be due to nasal congestion. Denies shortness of breath, loss of taste/smell. Used albuterol neb due to coughing fits, with some improvement. Fully COVID vaccinated. Current every day smoker.      Past Medical History:  Diagnosis Date  . Allergies   . Arthritis   . Asthma   . Back pain   . DJD (degenerative joint disease), lumbar     Patient Active Problem List   Diagnosis Date Noted  . BMI 40.0-44.9, adult ([place]) 07/22/2019  . Facet arthropathy, lumbar 07/22/2019  . Sacroiliitis ([place]) 07/22/2019  . ANXIETY 09/10/2006  . DEPRESSION 09/10/2006  . ASTHMA 09/10/2006  . HEADACHE 09/10/2006    Past Surgical History:  Procedure Laterality Date  . ABDOMINAL HYSTERECTOMY      OB History   No obstetric history on file.      Home Medications    Prior to Admission medications   Medication Sig Start Date End Date Taking? Authorizing Provider  albuterol (VENTOLIN HFA) 108 (90 Base) MCG/ACT inhaler Inhale 2 puffs into the lungs every 6 (six) hours as needed. For shortness of breath and wheezing 09/01/19   Cathlean Sauer V, PA-C  azelastine (ASTELIN) 0.1 % nasal spray Place 2 sprays into both nostrils 2 (two) times daily. 04/23/19   [name], Xavian Hardcastle V, PA-C  benzonatate (TESSALON) 200 MG capsule Take 1 capsule (200 mg total) by mouth every 8 (eight) hours. 09/01/19   Ok Edwards, PA-C  diclofenac (VOLTAREN) 75 MG EC tablet Take one tab twice daily with food 06/17/19   Lilia Argue R, DO  escitalopram (LEXAPRO) 10 MG tablet Take 10 mg by mouth at  bedtime.    [provider]  fluticasone (FLOVENT HFA) 220 MCG/ACT inhaler Inhale into the lungs 2 (two) times daily.    [provider]  ibuprofen (ADVIL,MOTRIN) 200 MG tablet Take 800 mg by mouth every 6 (six) hours as needed. For headache pain    [provider]  levocetirizine (XYZAL) 5 MG tablet Take 5 mg by mouth every evening.    [provider]  lidocaine (LIDODERM) 5 % Place 1 patch onto the skin daily. Remove & Discard patch within 12 hours or as directed by MD 07/22/19   Milus Banister C, DO  LORazepam (ATIVAN) 1 MG tablet Take 1 mg by mouth every 8 (eight) hours as needed for anxiety.    [provider]  montelukast (SINGULAIR) 10 MG tablet Take 10 mg by mouth at bedtime.    [provider]  omeprazole (PRILOSEC) 20 MG capsule Take 20 mg by mouth daily.    [provider]  thyroid (ARMOUR) 30 MG tablet Take 120 mg by mouth daily before breakfast.    [provider]  tiZANidine (ZANAFLEX) 2 MG tablet Take 1 tablet (2 mg total) by mouth every 8 (eight) hours as needed for muscle spasms. 06/09/19   Ok Edwards, PA-C    Family History Family History  Problem Relation Age of Onset  . Coronary  artery disease Mother   . Diabetes Mother   . COPD Mother   . Coronary artery disease Father   . Hypertension Father     Social History Social History   Tobacco Use  . Smoking status: Current Every Day Smoker    Types: Cigarettes  . Smokeless tobacco: Never Used  Substance Use Topics  . Alcohol use: No  . Drug use: No     Allergies   Clarithromycin and Codeine   Review of Systems Review of Systems  Reason unable to perform ROS: See HPI as above.     Physical Exam Triage Vital Signs ED Triage Vitals  Enc Vitals Group     BP 09/01/19 0827 111/75     Pulse Rate 09/01/19 0827 91     Resp 09/01/19 0827 (!) 26     Temp 09/01/19 0827 97.9 F (36.6 C)     Temp Source 09/01/19 0827 Oral     SpO2 09/01/19  0827 96 %     Weight --      Height --      Head Circumference --      Peak Flow --      Pain Score 09/01/19 0833 0     Pain Loc --      Pain Edu? --      Excl. in [place]? --    No data found.  Updated Vital Signs BP 111/75 (BP Location: Left Arm)   Pulse 91   Temp 97.9 F (36.6 C) (Oral)   Resp (!) 26   SpO2 96%   Physical Exam Constitutional:      General: She is not in acute distress.    Appearance: Normal appearance. She is not ill-appearing, toxic-appearing or diaphoretic.  HENT:     Head: Normocephalic and atraumatic.     Nose:     Right Sinus: Frontal sinus tenderness present. No maxillary sinus tenderness.     Left Sinus: Frontal sinus tenderness present. No maxillary sinus tenderness.     Mouth/Throat:     Mouth: Mucous membranes are moist.     Pharynx: Oropharynx is clear. Uvula midline.  Cardiovascular:     Rate and Rhythm: Normal rate and regular rhythm.     Heart sounds: Normal heart sounds. No murmur heard.  No friction rub. No gallop.   Pulmonary:     Effort: Pulmonary effort is normal. No accessory muscle usage, prolonged expiration, respiratory distress or retractions.     Comments: Lungs clear to auscultation without adventitious lung sounds.  Tachypnea resolved on exam Musculoskeletal:     Cervical back: Normal range of motion and neck supple.  Neurological:     General: No focal deficit present.     Mental Status: She is alert and oriented to person, place, and time.      UC Treatments / Results  Labs (all labs ordered are listed, but only abnormal results are displayed) Labs Reviewed  NOVEL CORONAVIRUS, NAA    EKG   Radiology No results found.  Procedures Procedures (including critical care time)  Medications Ordered in UC Medications - No data to display  Initial Impression / Assessment and Plan / UC Course  I have reviewed the triage vital signs and the nursing notes.  Pertinent labs & imaging results that were available  during my care of the patient were reviewed by me and considered in my medical decision making (see chart for details).    COVID PCR test ordered. Patient to quarantine until  testing results return. No alarming signs on exam.  Patient speaking in full sentences without respiratory distress. LCTAB. Symptomatic treatment discussed.  Push fluids.  Return precautions given.  Patient expresses understanding and agrees to plan.  Final Clinical Impressions(s) / UC Diagnoses   Final diagnoses:  Encounter for screening for COVID-19  Cough  Sore throat  Nasal congestion    ED Prescriptions    Medication Sig Dispense Auth. Provider   benzonatate (TESSALON) 200 MG capsule Take 1 capsule (200 mg total) by mouth every 8 (eight) hours. 21 capsule Sheralee Qazi V, PA-C   albuterol (VENTOLIN HFA) 108 (90 Base) MCG/ACT inhaler Inhale 2 puffs into the lungs every 6 (six) hours as needed. For shortness of breath and wheezing 18 g Ok Edwards, PA-C     PDMP not reviewed this encounter.   Ok Edwards, PA-C 09/01/19 [phone]

## 2019-09-02 LAB — NOVEL CORONAVIRUS, NAA: SARS-CoV-2, NAA: DETECTED — AB

## 2019-09-02 LAB — SARS-COV-2, NAA 2 DAY TAT

## 2019-09-03 ENCOUNTER — Telehealth: Payer: Self-pay | Admitting: Infectious Diseases

## 2019-09-03 NOTE — Telephone Encounter (Signed)
Called to discuss with patient about Covid symptoms and the use of Re, a monoclonal antibody infusion for those with mild to moderate Covid symptoms and at a high risk of hospitalization.  Pt is qualified for this infusion at the [facility] infusion center due to asthma, BMI > 25  Sx started 4 days ago based on ER visit yesterday.    Message left to call back  MyChart sent

## 2019-09-09 ENCOUNTER — Other Ambulatory Visit: Payer: Self-pay

## 2019-09-09 ENCOUNTER — Ambulatory Visit
Admission: EM | Admit: 2019-09-09 | Discharge: 2019-09-09 | Disposition: A | Discharge status: Home / Self Care | Payer: Commercial Managed Care - PPO | Attending: Emergency Medicine | Admitting: Emergency Medicine

## 2019-09-09 DIAGNOSIS — U071 COVID-19: Secondary | ICD-10-CM | POA: Diagnosis not present

## 2019-09-09 DIAGNOSIS — R05 Cough: Secondary | ICD-10-CM | POA: Diagnosis not present

## 2019-09-09 DIAGNOSIS — R059 Cough, unspecified: Secondary | ICD-10-CM

## 2019-09-09 MED ORDER — NEOMYCIN-POLYMYXIN-HC 3.5-10000-1 OT SOLN: 3.0000 [drp] | Freq: Three times a day (TID) | OTIC | 0 refills | Status: DC

## 2019-09-09 MED ORDER — PREDNISONE 50 MG PO TABS: 50.0000 mg | ORAL_TABLET | Freq: Every day | ORAL | 0 refills | Status: DC

## 2019-09-09 MED ORDER — PROMETHAZINE-DM 6.25-15 MG/5ML PO SYRP: 5.0000 mL | ORAL_SOLUTION | Freq: Four times a day (QID) | ORAL | 0 refills | Status: DC | PRN

## 2019-09-09 NOTE — ED Triage Notes (Signed)
Pt COVID+ 8 days ago, here for continued cough, nasal congestion and now left ear pain. Pt taking OTC cold and flu medicine and meds given day of testing with no relief

## 2019-09-09 NOTE — Discharge Instructions (Addendum)
Use eardrops as prescribed for the next week. Return for worsening ear pain, swelling, discharge, bleeding, decreased hearing, development of jaw pain/swelling, fever.  Do NOT use Q-tips as these can cause your ear wax to get stuck, the tips may break off and become a foreign body requiring additional medical care, or puncture your eardrum.  Helpful prevention tip: Use a solution of equal parts isopropyl (rubbing) alcohol and white vinegar (acetic acid) in both ears after swimming. 

## 2019-09-09 NOTE — ED Provider Notes (Signed)
[facility]    CSN: [id number] Arrival date & time: 09/09/19  1158      History   Chief Complaint Chief Complaint  Patient presents with  . Cough    HPI Teresa Castaneda is a 49 y.o. female with history of asthma, allergies presenting for worsening cough, nasal congestion, left ear pain.  Patient is Covid positive: Has completed 8 days of quarantine.  Denies shortness of breath, chest pain, palpitations, vomiting, lower leg swelling.  No fever.  Taking OTC cold medications without relief.   Past Medical History:  Diagnosis Date  . Allergies   . Arthritis   . Asthma   . Back pain   . DJD (degenerative joint disease), lumbar     Patient Active Problem List   Diagnosis Date Noted  . BMI 40.0-44.9, adult ([place]) 07/22/2019  . Facet arthropathy, lumbar 07/22/2019  . Sacroiliitis ([place]) 07/22/2019  . ANXIETY 09/10/2006  . DEPRESSION 09/10/2006  . ASTHMA 09/10/2006  . HEADACHE 09/10/2006    Past Surgical History:  Procedure Laterality Date  . ABDOMINAL HYSTERECTOMY      OB History   No obstetric history on file.      Home Medications    Prior to Admission medications   Medication Sig Start Date End Date Taking? Authorizing Provider  albuterol (VENTOLIN HFA) 108 (90 Base) MCG/ACT inhaler Inhale 2 puffs into the lungs every 6 (six) hours as needed. For shortness of breath and wheezing 09/01/19   Cathlean Sauer V, PA-C  azelastine (ASTELIN) 0.1 % nasal spray Place 2 sprays into both nostrils 2 (two) times daily. 04/23/19   [name], Amy V, PA-C  benzonatate (TESSALON) 200 MG capsule Take 1 capsule (200 mg total) by mouth every 8 (eight) hours. 09/01/19   Ok Edwards, PA-C  diclofenac (VOLTAREN) 75 MG EC tablet Take one tab twice daily with food 06/17/19   Lilia Argue R, DO  escitalopram (LEXAPRO) 10 MG tablet Take 10 mg by mouth at bedtime.    [provider]  fluticasone (FLOVENT HFA) 220 MCG/ACT inhaler Inhale into the lungs 2 (two) times daily.    [provider]  ibuprofen (ADVIL,MOTRIN) 200 MG tablet Take 800 mg by mouth every 6 (six) hours as needed. For headache pain    [provider]  levocetirizine (XYZAL) 5 MG tablet Take 5 mg by mouth every evening.    [provider]  lidocaine (LIDODERM) 5 % Place 1 patch onto the skin daily. Remove & Discard patch within 12 hours or as directed by MD 07/22/19   Milus Banister C, DO  LORazepam (ATIVAN) 1 MG tablet Take 1 mg by mouth every 8 (eight) hours as needed for anxiety.    [provider]  montelukast (SINGULAIR) 10 MG tablet Take 10 mg by mouth at bedtime.    [provider]  neomycin-polymyxin-hydrocortisone (CORTISPORIN) OTIC solution Place 3 drops into the left ear 3 (three) times daily. 09/09/19   Hall-Potvin, [place], PA-C  omeprazole (PRILOSEC) 20 MG capsule Take 20 mg by mouth daily.    [provider]  predniSONE (DELTASONE) 50 MG tablet Take 1 tablet (50 mg total) by mouth daily with breakfast. 09/09/19   Hall-Potvin, [place], PA-C  promethazine-dextromethorphan (PROMETHAZINE-DM) 6.25-15 MG/5ML syrup Take 5 mLs by mouth 4 (four) times daily as needed for cough. 09/09/19   Hall-Potvin, [place], PA-C  thyroid (ARMOUR) 30 MG tablet Take 120 mg by mouth daily before breakfast.    [provider]  tiZANidine (  ZANAFLEX) 2 MG tablet Take 1 tablet (2 mg total) by mouth every 8 (eight) hours as needed for muscle spasms. 06/09/19   Ok Edwards, PA-C    Family History Family History  Problem Relation Age of Onset  . Coronary artery disease Mother   . Diabetes Mother   . COPD Mother   . Coronary artery disease Father   . Hypertension Father     Social History Social History   Tobacco Use  . Smoking status: Current Every Day Smoker    Types: Cigarettes  . Smokeless tobacco: Never Used  Substance Use Topics  . Alcohol use: No  . Drug use: No     Allergies   Clarithromycin and Codeine   Review of Systems As per  HPI   Physical Exam Triage Vital Signs ED Triage Vitals  Enc Vitals Group     BP 09/09/19 1349 122/84     Pulse Rate 09/09/19 1349 (!) 118     Resp 09/09/19 1349 (!) 24     Temp 09/09/19 1349 98.4 F (36.9 C)     Temp Source 09/09/19 1349 Oral     SpO2 09/09/19 1349 93 %     Weight --      Height --      Head Circumference --      Peak Flow --      Pain Score 09/09/19 1407 7     Pain Loc --      Pain Edu? --      Excl. in [place]? --    No data found.  Updated Vital Signs BP 122/84 (BP Location: Left Arm)   Pulse (!) 106   Temp 98.4 F (36.9 C) (Oral)   Resp 20   SpO2 95%   Visual Acuity Right Eye Distance:   Left Eye Distance:   Bilateral Distance:    Right Eye Near:   Left Eye Near:    Bilateral Near:     Physical Exam Constitutional:      General: She is not in acute distress. HENT:     Head: Normocephalic and atraumatic.     Mouth/Throat:     Mouth: Mucous membranes are moist.     Pharynx: Oropharynx is clear.  Eyes:     General: No scleral icterus.    Pupils: Pupils are equal, round, and reactive to light.  Cardiovascular:     Rate and Rhythm: Normal rate and regular rhythm.  Pulmonary:     Effort: Pulmonary effort is normal. No respiratory distress.     Breath sounds: Rhonchi present. No wheezing or rales.  Skin:    Capillary Refill: Capillary refill takes less than 2 seconds.     Coloration: Skin is not jaundiced or pale.  Neurological:     Mental Status: She is alert and oriented to person, place, and time.      UC Treatments / Results  Labs (all labs ordered are listed, but only abnormal results are displayed) Labs Reviewed - No data to display  EKG   Radiology No results found.  Procedures Procedures (including critical [facility]eda time)  Medications Ordered in UC Medications - No data to display  Initial Impression / Assessment and Plan / UC Course  I have reviewed the triage vital signs and the nursing notes.  Pertinent labs &  imaging results that were available during my Castaneda of the patient were reviewed by me and considered in my medical decision making (see chart for details).  Patient febrile, nontoxic, and no respiratory distress.  Will treat supportively as outlined below.  Return precautions discussed, pt verbalized understanding and is agreeable to plan. Final Clinical Impressions(s) / UC Diagnoses   Final diagnoses:  [id number] virus infection  Cough     Discharge Instructions     Use eardrops as prescribed for the next week. Return for worsening ear pain, swelling, discharge, bleeding, decreased hearing, development of jaw pain/swelling, fever.  Do NOT use Q-tips as these can cause your ear wax to get stuck, the tips may break off and become a foreign body requiring additional medical Castaneda, or puncture your eardrum.  Helpful prevention tip: Use a solution of equal parts isopropyl (rubbing) alcohol and white vinegar (acetic acid) in both ears after swimming.    ED Prescriptions    Medication Sig Dispense Auth. Provider   predniSONE (DELTASONE) 50 MG tablet Take 1 tablet (50 mg total) by mouth daily with breakfast. 5 tablet Hall-Potvin, [place], PA-C   promethazine-dextromethorphan (PROMETHAZINE-DM) 6.25-15 MG/5ML syrup Take 5 mLs by mouth 4 (four) times daily as needed for cough. 118 mL Hall-Potvin, [place], PA-C   neomycin-polymyxin-hydrocortisone (CORTISPORIN) OTIC solution Place 3 drops into the left ear 3 (three) times daily. 10 mL Hall-Potvin, [place], PA-C     PDMP not reviewed this encounter.   Hall-Potvin, [place], [place] 09/09/19 1531

## 2019-09-11 ENCOUNTER — Telehealth: Payer: Commercial Managed Care - PPO | Admitting: Nurse Practitioner

## 2019-09-11 DIAGNOSIS — R739 Hyperglycemia, unspecified: Secondary | ICD-10-CM | POA: Diagnosis not present

## 2019-09-11 NOTE — Progress Notes (Signed)
Based on what you shared with me it looks like you have a very high blood sugar,that should be evaluated in a face to face office visit. The prednisione probably helped increase your blood sugar as well as any infection . A prolonged blood sugar that high can cause significant problems. You need to be seen as soon as possible to get that blood sugar down.    NOTE: If you entered your credit card information for this eVisit, you will not be charged. You may see a "hold" on your card for the $35 but that hold will drop off and you will not have a charge processed.  If you are having a true medical emergency please call 911.     For an urgent face to face visit, [place] has four urgent care centers for your convenience:   . [facility]    [phone]                  Get Driving Directions  [address], [place] [address] . 10 am to 8 pm Monday-Friday . 12 pm to 8 pm Saturday-Sunday   . [facility] at [place]                  Get Driving Directions  [address], [place], [place] [address] . 8 am to 8 pm Monday-Friday . 9 am to 6 pm Saturday . 11 am to 6 pm Sunday   . [facility] at [place]                  Get Driving Directions   [address] [place], [place] [address] . 8 am to 8 pm Monday-Friday . 8 am to 4 pm Saturday-Sunday    . [facility] at [place]                    Get Driving Directions  [phone]  [address], [place], [place] [address]  . Monday-Friday, 12 PM to 6 PM    Your e-visit answers were reviewed by a board certified advanced clinical practitioner to complete your personal care plan.  Thank you for using e-Visits.

## 2019-09-12 ENCOUNTER — Ambulatory Visit
Admission: EM | Admit: 2019-09-12 | Discharge: 2019-09-12 | Disposition: A | Discharge status: Home / Self Care | Payer: Commercial Managed Care - PPO | Attending: Family Medicine | Admitting: Family Medicine

## 2019-09-12 ENCOUNTER — Ambulatory Visit (INDEPENDENT_AMBULATORY_CARE_PROVIDER_SITE_OTHER): Payer: Commercial Managed Care - PPO

## 2019-09-12 DIAGNOSIS — R0602 Shortness of breath: Secondary | ICD-10-CM

## 2019-09-12 DIAGNOSIS — U071 COVID-19: Secondary | ICD-10-CM | POA: Diagnosis not present

## 2019-09-12 DIAGNOSIS — J189 Pneumonia, unspecified organism: Secondary | ICD-10-CM

## 2019-09-12 LAB — POCT URINALYSIS DIP (MANUAL ENTRY)
Bilirubin, UA: NEGATIVE
Blood, UA: NEGATIVE
Glucose, UA: NEGATIVE mg/dL
Ketones, POC UA: NEGATIVE mg/dL
Leukocytes, UA: NEGATIVE
Nitrite, UA: NEGATIVE
Protein Ur, POC: NEGATIVE mg/dL
Spec Grav, UA: 1.02 (ref 1.010–1.025)
Urobilinogen, UA: 0.2 E.U./dL
pH, UA: 6.5 (ref 5.0–8.0)

## 2019-09-12 LAB — POCT FASTING CBG KUC MANUAL ENTRY: POCT Glucose (KUC): 213 mg/dL — AB (ref 70–99)

## 2019-09-12 MED ORDER — GLIPIZIDE 10 MG PO TABS: 10.0000 mg | ORAL_TABLET | Freq: Every day | ORAL | 1 refills | Status: DC

## 2019-09-12 MED ORDER — CEFDINIR 300 MG PO CAPS: 600.0000 mg | ORAL_CAPSULE | Freq: Every day | ORAL | 0 refills | Status: DC

## 2019-09-12 MED ORDER — IPRATROPIUM-ALBUTEROL 0.5-2.5 (3) MG/3ML IN SOLN
3.0000 mL | Freq: Four times a day (QID) | RESPIRATORY_TRACT | 1 refills | Status: DC | PRN
Start: 2019-09-12 — End: 2019-11-17

## 2019-09-12 MED ORDER — IPRATROPIUM BROMIDE 0.03 % NA SOLN: 2.0000 | Freq: Two times a day (BID) | NASAL | 0 refills | Status: DC

## 2019-09-12 NOTE — Discharge Instructions (Addendum)
Follow-up with primary care provider for additional management of type 2 diabetes.  Given your recent elevated levels in your blood sugar I suspect glycemic control has worsened.  Start glipizide 10 mg once daily with breakfast.  Be sure to take with food as this medication can rapidly lower your blood glucose reading.  Pneumonia has been treated today with Omnicef 600 mg once daily for total of 10 days.  I have also prescribed Atrovent nasal spray, 2 sprays to each nare twice daily as needed.  Also would like to add duo nebulizer treatments every 6 hours as needed for wheezing, shortness of breath and cyclic coughing.  If any of your symptoms become severe go to the Emergency Department If blood sugars are > 500, go to the emergency department. Call the [place] center at [phone] to schedule a follow-up visit in 1 week.

## 2019-09-12 NOTE — ED Provider Notes (Signed)
[name]    CSN: [id number] Arrival date & time: 09/12/19  1013      History   Chief Complaint Chief Complaint  Patient presents with  . Nasal Congestion  . Otalgia    right ear  . Shortness of Breath    HPI Teresa Castaneda is a 49 y.o. female.   HPI  Patient diagnosed with COVID-19 infection on 09/01/19. She is high risk for complications related to COVID-19 due to obesity, asthma, currently daily smoker. Symptoms today include SOB, bilateral ear pain, and worsening sinus pressure.  Treatment to date has been prednisone 50 mg once daily which she is currently completing, otalgia antibiotic eardrops, and OTC antihistamine for nasal congestion.  Patient has continued chronic course of Advair and albuterol for management of asthma symptoms and shortness of breath.  Patient endorses significant increased work of breathing with any mild activity included basic walking.  She continues to have a cough and endorses chest tightness.  She has had shivers although is unaware of any overt fever. Patient was referred to ED care infusion clinic however declined treatment.  Oxygen level has been stable since presenting here in the clinic.  Patient did check her blood sugar at home last night and it was over 500.  She did not present to the ER.  Reports she has had the shakes and some blurring of vision.  She rechecked her blood sugar today it was less than 300.  She and PCP have been discussing monitoring blood sugar before starting her on medication however she does check her blood sugar routinely per EMR blood sugars typically less than 120, which is consistent for prediabetic range.    Past Medical History:  Diagnosis Date  . Allergies   . Arthritis   . Asthma   . Back pain   . DJD (degenerative joint disease), lumbar     Patient Active Problem List   Diagnosis Date Noted  . BMI 40.0-44.9, adult ([place]) 07/22/2019  . Facet arthropathy, lumbar 07/22/2019  . Sacroiliitis ([place])  07/22/2019  . ANXIETY 09/10/2006  . DEPRESSION 09/10/2006  . ASTHMA 09/10/2006  . HEADACHE 09/10/2006    Past Surgical History:  Procedure Laterality Date  . ABDOMINAL HYSTERECTOMY      OB History   No obstetric history on file.      Home Medications    Prior to Admission medications   Medication Sig Start Date End Date Taking? Authorizing Provider  albuterol (VENTOLIN HFA) 108 (90 Base) MCG/ACT inhaler Inhale 2 puffs into the lungs every 6 (six) hours as needed. For shortness of breath and wheezing 09/01/19   Cathlean Sauer V, PA-C  azelastine (ASTELIN) 0.1 % nasal spray Place 2 sprays into both nostrils 2 (two) times daily. 04/23/19   [name], Amy V, PA-C  benzonatate (TESSALON) 200 MG capsule Take 1 capsule (200 mg total) by mouth every 8 (eight) hours. 09/01/19   [name], Amy V, PA-C  cefdinir (OMNICEF) 300 MG capsule Take 2 capsules (600 mg total) by mouth daily. 09/12/19   Scot Jun, FNP  diclofenac (VOLTAREN) 75 MG EC tablet Take one tab twice daily with food 06/17/19   Lilia Argue R, DO  escitalopram (LEXAPRO) 10 MG tablet Take 10 mg by mouth at bedtime.    [provider]  fluticasone (FLOVENT HFA) 220 MCG/ACT inhaler Inhale into the lungs 2 (two) times daily.    [provider]  glipiZIDE (GLUCOTROL) 10 MG tablet Take 1 tablet (10 mg total)  by mouth daily before breakfast. 09/12/19   Scot Jun, FNP  ibuprofen (ADVIL,MOTRIN) 200 MG tablet Take 800 mg by mouth every 6 (six) hours as needed. For headache pain    [provider]  ipratropium (ATROVENT) 0.03 % nasal spray Place 2 sprays into both nostrils 2 (two) times daily. 09/12/19   Scot Jun, FNP  ipratropium-albuterol (DUONEB) 0.5-2.5 (3) MG/3ML SOLN Take 3 mLs by nebulization every 6 (six) hours as needed. 09/12/19   Scot Jun, FNP  levocetirizine (XYZAL) 5 MG tablet Take 5 mg by mouth every evening.    [provider]  lidocaine (LIDODERM) 5 % Place 1 patch onto the skin  daily. Remove & Discard patch within 12 hours or as directed by MD 07/22/19   Milus Banister C, DO  LORazepam (ATIVAN) 1 MG tablet Take 1 mg by mouth every 8 (eight) hours as needed for anxiety.    [provider]  montelukast (SINGULAIR) 10 MG tablet Take 10 mg by mouth at bedtime.    [provider]  neomycin-polymyxin-hydrocortisone (CORTISPORIN) OTIC solution Place 3 drops into the left ear 3 (three) times daily. 09/09/19   Hall-Potvin, [place], PA-C  omeprazole (PRILOSEC) 20 MG capsule Take 20 mg by mouth daily.    [provider]  predniSONE (DELTASONE) 50 MG tablet Take 1 tablet (50 mg total) by mouth daily with breakfast. 09/09/19   Hall-Potvin, [place], PA-C  promethazine-dextromethorphan (PROMETHAZINE-DM) 6.25-15 MG/5ML syrup Take 5 mLs by mouth 4 (four) times daily as needed for cough. 09/09/19   Hall-Potvin, [place], PA-C  thyroid (ARMOUR) 30 MG tablet Take 120 mg by mouth daily before breakfast.    [provider]  tiZANidine (ZANAFLEX) 2 MG tablet Take 1 tablet (2 mg total) by mouth every 8 (eight) hours as needed for muscle spasms. 06/09/19   Ok Edwards, PA-C    Family History Family History  Problem Relation Age of Onset  . Coronary artery disease Mother   . Diabetes Mother   . COPD Mother   . Coronary artery disease Father   . Hypertension Father     Social History Social History   Tobacco Use  . Smoking status: Current Every Day Smoker    Types: Cigarettes  . Smokeless tobacco: Never Used  Substance Use Topics  . Alcohol use: No  . Drug use: No    Allergies   Clarithromycin and Codeine  Review of Systems Review of Systems Pertinent negatives listed in HPI  Physical Exam Triage Vital Signs ED Triage Vitals  Enc Vitals Group     BP 09/12/19 1036 126/85     Pulse Rate 09/12/19 1036 91     Resp 09/12/19 1036 20     Temp 09/12/19 1036 97.9 F (36.6 C)     Temp Source 09/12/19 1036 Oral     SpO2 09/12/19 1036 95 %      Weight --      Height --      Head Circumference --      Peak Flow --      Pain Score 09/12/19 1039 0     Pain Loc --      Pain Edu? --      Excl. in [place]? --    No data found.  Updated Vital Signs BP 126/85 (BP Location: Left Arm)   Pulse 91   Temp 97.9 F (36.6 C) (Oral)   Resp 20   SpO2 95%   Visual Acuity Right Eye  Distance:   Left Eye Distance:   Bilateral Distance:    Right Eye Near:   Left Eye Near:    Bilateral Near:     Physical Exam Constitutional:      Appearance: She is well-developed.  HENT:     Head: Normocephalic.  Eyes:     Extraocular Movements: Extraocular movements intact.  Cardiovascular:     Rate and Rhythm: Normal rate and regular rhythm.  Pulmonary:     Effort: Accessory muscle usage present. No respiratory distress.     Breath sounds: Decreased breath sounds and wheezing present. No rhonchi or rales.  Musculoskeletal:        General: Normal range of motion.     Cervical back: Normal range of motion and neck supple.  Skin:    General: Skin is warm and dry.  Neurological:     General: No focal deficit present.  Psychiatric:        Mood and Affect: Mood normal.        Behavior: Behavior normal.    UC Treatments / Results  Labs (all labs ordered are listed, but only abnormal results are displayed) Labs Reviewed  POCT FASTING CBG [facility] MANUAL ENTRY - Abnormal; Notable for the following components:      Result Value   POCT Glucose ([facility]) 213 (*)    All other components within normal limits  POCT URINALYSIS DIP (MANUAL ENTRY)    EKG   Radiology DG Chest 2 View  Result Date: 09/12/2019 CLINICAL DATA:  Shortness of breath. Coronavirus infection over the last 2 weeks. EXAM: CHEST - 2 VIEW COMPARISON:  02/19/2008. FINDINGS: Heart size is normal. Mediastinal shadows are normal. Mild patchy infiltrates and or volume loss in the mid and lower lungs. No dense consolidation or lobar collapse. No effusions. No significant bone finding.  IMPRESSION: Mild patchy infiltrates and or volume loss in the mid and lower lungs. Electronically Signed   By: Nelson Chimes M.D.   On: 09/12/2019 11:25    Procedures Procedures (including critical care time)  Medications Ordered in UC Medications - No data to display  Initial Impression / Assessment and Plan / UC Course  I have reviewed the triage vital signs and the nursing notes.  Pertinent labs & imaging results that were available during my care of the patient were reviewed by me and considered in my medical decision making (see chart for details).     COVID-19 positive as of 8/14. Patient has had a mildly complicated course since her diagnosis and is high risk for complications. Chest x-ray today significant for right lobar pneumonia-Omnicef 600 mg daily x 10 days. Nebulizer machine dispensed for home use at visit with Duoneb treatments per orders. Atrovent nasal spray. Trial glipizide for hyperglycemia until patient is able to follow-up with PCP. Detailed instructions given regarding risk of hypoglycemia. Contact information given to follow-up at the [facility] clinic.  Red flags discussed including blood sugars> 500 or unreadable warrant immediate follow-up in the ER. Patient verbalized understanding and agreement with plan       Final Clinical Impressions(s) / UC Diagnoses   Final diagnoses:  [id number] virus infection  Shortness of breath  Pneumonia of right lower lobe due to infectious organism     Discharge Instructions     Follow-up with primary care provider for additional management of type 2 diabetes.  Given your recent elevated levels in your blood sugar I suspect glycemic control has worsened.  Start glipizide 10 mg once daily with  breakfast.  Be sure to take with food as this medication can rapidly lower your blood glucose reading.  Pneumonia has been treated today with Omnicef 600 mg once daily for total of 10 days.  I have also prescribed Atrovent nasal spray, 2  sprays to each nare twice daily as needed.  Also would like to add duo nebulizer treatments every 6 hours as needed for wheezing, shortness of breath and cyclic coughing.  If any of your symptoms become severe go to the Emergency Department If blood sugars are > 500, go to the emergency department. Call the [place] center at [phone] to schedule a follow-up visit in 1 week.    ED Prescriptions    Medication Sig Dispense Auth. Provider   cefdinir (OMNICEF) 300 MG capsule Take 2 capsules (600 mg total) by mouth daily. 20 capsule Scot Jun, FNP   ipratropium (ATROVENT) 0.03 % nasal spray Place 2 sprays into both nostrils 2 (two) times daily. 30 mL Scot Jun, FNP   glipiZIDE (GLUCOTROL) 10 MG tablet Take 1 tablet (10 mg total) by mouth daily before breakfast. 30 tablet Scot Jun, FNP   ipratropium-albuterol (DUONEB) 0.5-2.5 (3) MG/3ML SOLN Take 3 mLs by nebulization every 6 (six) hours as needed. 360 mL Scot Jun, FNP     PDMP not reviewed this encounter.   Scot Jun, [place] 09/13/19 2118

## 2019-09-12 NOTE — ED Triage Notes (Signed)
Pt Covid + 14 days ago, here for continued congestion and right ear pressure and SOB.

## 2019-09-23 ENCOUNTER — Telehealth: Payer: Self-pay | Admitting: Nurse Practitioner

## 2019-09-23 ENCOUNTER — Other Ambulatory Visit: Payer: Self-pay

## 2019-09-23 ENCOUNTER — Ambulatory Visit
Admission: RE | Admit: 2019-09-23 | Discharge: 2019-09-23 | Disposition: A | Discharge status: Home / Self Care | Payer: Commercial Managed Care - PPO | Source: Ambulatory Visit | Attending: Nurse Practitioner | Admitting: Nurse Practitioner

## 2019-09-23 ENCOUNTER — Other Ambulatory Visit: Payer: Self-pay | Admitting: Nurse Practitioner

## 2019-09-23 ENCOUNTER — Ambulatory Visit (INDEPENDENT_AMBULATORY_CARE_PROVIDER_SITE_OTHER): Payer: Commercial Managed Care - PPO | Admitting: Nurse Practitioner

## 2019-09-23 VITALS — BP 118/64 | HR 85 | Temp 97.5°F | Ht 66.5 in | Wt 268.0 lb

## 2019-09-23 DIAGNOSIS — U071 COVID-19: Secondary | ICD-10-CM

## 2019-09-23 DIAGNOSIS — Z8616 Personal history of COVID-19: Secondary | ICD-10-CM | POA: Diagnosis not present

## 2019-09-23 DIAGNOSIS — J1282 Pneumonia due to coronavirus disease 2019: Secondary | ICD-10-CM | POA: Diagnosis not present

## 2019-09-23 DIAGNOSIS — R059 Cough, unspecified: Secondary | ICD-10-CM | POA: Insufficient documentation

## 2019-09-23 DIAGNOSIS — R05 Cough: Secondary | ICD-10-CM

## 2019-09-23 DIAGNOSIS — J4520 Mild intermittent asthma, uncomplicated: Secondary | ICD-10-CM

## 2019-09-23 NOTE — Progress Notes (Signed)
@Patient  ID: Teresa Castaneda, female    DOB: August 13, 1970, 49 y.o.   MRN: [id number]  Chief Complaint  Patient presents with  . COVID Follow up    Positive 8/16, Sx: Cough up phlegm,  congestion, weakness    Referring provider: No ref. provider found  49 year old female with history of anxiety, depression, asthma, and obesity. Diagnosed with Covid 09/01/19.   Recent significant events:  09/01/19 ED visit: diagnosed with Covid - prescribed albuterol inhaler and Tessalon Perles  09/09/2019 ED visit: Patient returned to the ED with congestion cough and ear pain.  Prescribed eardrops, prednisone, cough syrup.  09/12/2019 ED visit: Patient returned to the ED with elevated blood sugars.  Chest x-ray showed Covid pneumonia.  Patient was prescribed Glipizide, Omnicef, and nebulizer treatment.  Patient was advised to follow-up with PCP to address elevated blood sugars.  Imaging:  Chest x ray 09/12/19: Mild patchy infiltrates and or volume loss in the mid and lower lungs.  HPI   Patient presents today for post COVID care clinic visit.  Patient has had multiple ED visits over the last couple weeks.  She was diagnosed with Covid on 09/01/2019 at her return visit in the ED on 09/09/2018 when she was prescribed prednisone.  She then returned back to the ED on 09/12/2019 with elevated blood sugars.  This was most likely due to prednisone use, but ED did send her home with Glipizide and advised her to follow-up with PCP.  Patient does have an appointment set up in 1 week to establish care with a new PCP.  Advised that she does need to follow-up on elevated blood sugars at that visit.  Patient is keeping a log of her blood sugars for the upcoming appointment and has lab work scheduled today.  Patient states that she is slowly improving but still continues to have significant cough that is productive of thick dark sputum at times.  She does continue to have shortness of breath with exertion.  She states that she  has only been using her Flovent once daily.  We discussed that she should be using this 2 puffs twice daily.  She does have albuterol inhaler and DuoNeb nebulizer to use as needed.  She is also taking Singulair at night. Denies f/c/s, n/v/d, hemoptysis, PND, chest pain or edema.        Allergies  Allergen Reactions  . Clarithromycin Other (See Comments)    Body cramps   . Codeine Nausea And Vomiting     There is no immunization history on file for this patient.  Past Medical History:  Diagnosis Date  . Allergies   . Arthritis   . Asthma   . Back pain   . DJD (degenerative joint disease), lumbar     Tobacco History: Social History   Tobacco Use  Smoking Status Current Every Day Smoker  . Types: Cigarettes  Smokeless Tobacco Never Used   Ready to quit: Not Answered Counseling given: Not Answered   Outpatient Encounter Medications as of 09/23/2019  Medication Sig  . albuterol (VENTOLIN HFA) 108 (90 Base) MCG/ACT inhaler Inhale 2 puffs into the lungs every 6 (six) hours as needed. For shortness of breath and wheezing  . azelastine (ASTELIN) 0.1 % nasal spray Place 2 sprays into both nostrils 2 (two) times daily.  . fluticasone (FLOVENT HFA) 220 MCG/ACT inhaler Inhale into the lungs 2 (two) times daily.  Marland Kitchen glipiZIDE (GLUCOTROL) 10 MG tablet Take 1 tablet (10 mg total) by mouth  daily before breakfast.  . ibuprofen (ADVIL,MOTRIN) 200 MG tablet Take 800 mg by mouth every 6 (six) hours as needed. For headache pain  . ipratropium (ATROVENT) 0.03 % nasal spray Place 2 sprays into both nostrils 2 (two) times daily.  Marland Kitchen ipratropium-albuterol (DUONEB) 0.5-2.5 (3) MG/3ML SOLN Take 3 mLs by nebulization every 6 (six) hours as needed.  Marland Kitchen levocetirizine (XYZAL) 5 MG tablet Take 5 mg by mouth every evening.  . lidocaine (LIDODERM) 5 % Place 1 patch onto the skin daily. Remove & Discard patch within 12 hours or as directed by MD  . LORazepam (ATIVAN) 1 MG tablet Take 1 mg by mouth every 8  (eight) hours as needed for anxiety.  . montelukast (SINGULAIR) 10 MG tablet Take 10 mg by mouth at bedtime.  Marland Kitchen neomycin-polymyxin-hydrocortisone (CORTISPORIN) OTIC solution Place 3 drops into the left ear 3 (three) times daily.  Marland Kitchen omeprazole (PRILOSEC) 20 MG capsule Take 20 mg by mouth daily.  . promethazine-dextromethorphan (PROMETHAZINE-DM) 6.25-15 MG/5ML syrup Take 5 mLs by mouth 4 (four) times daily as needed for cough.  . thyroid (ARMOUR) 30 MG tablet Take 120 mg by mouth daily before breakfast.  . benzonatate (TESSALON) 200 MG capsule Take 1 capsule (200 mg total) by mouth every 8 (eight) hours. (Patient not taking: Reported on 09/23/2019)  . diclofenac (VOLTAREN) 75 MG EC tablet Take one tab twice daily with food (Patient not taking: Reported on 09/23/2019)  . escitalopram (LEXAPRO) 10 MG tablet Take 10 mg by mouth at bedtime. (Patient not taking: Reported on 09/23/2019)  . tiZANidine (ZANAFLEX) 2 MG tablet Take 1 tablet (2 mg total) by mouth every 8 (eight) hours as needed for muscle spasms. (Patient not taking: Reported on 09/23/2019)  . [DISCONTINUED] cefdinir (OMNICEF) 300 MG capsule Take 2 capsules (600 mg total) by mouth daily.  . [DISCONTINUED] predniSONE (DELTASONE) 50 MG tablet Take 1 tablet (50 mg total) by mouth daily with breakfast.   No facility-administered encounter medications on file as of 09/23/2019.     Review of Systems  Review of Systems  Constitutional: Positive for fatigue. Negative for chills and fever.  HENT: Negative.   Respiratory: Positive for cough and shortness of breath.   Cardiovascular: Negative.  Negative for chest pain, palpitations and leg swelling.  Gastrointestinal: Negative.   Allergic/Immunologic: Negative.   Neurological: Negative.   Psychiatric/Behavioral: Negative.        Physical Exam  BP 118/64 (BP Location: Left Arm)   Pulse 85   Temp (!) 97.5 F (36.4 C)   Ht 5' 6.5" (1.689 m)   Wt 268 lb 0.1 oz (121.6 kg)   SpO2 96%   BMI 42.61  kg/m   Wt Readings from Last 5 Encounters:  09/23/19 268 lb 0.1 oz (121.6 kg)  07/22/19 270 lb (122.5 kg)  06/17/19 270 lb (122.5 kg)  06/14/11 243 lb (110.2 kg)     Physical Exam Vitals and nursing note reviewed.  Constitutional:      General: She is not in acute distress.    Appearance: She is well-developed.  Cardiovascular:     Rate and Rhythm: Normal rate and regular rhythm.  Pulmonary:     Effort: Pulmonary effort is normal.     Breath sounds: Normal breath sounds.  Musculoskeletal:     Right lower leg: No edema.     Left lower leg: No edema.  Neurological:     Mental Status: She is alert and oriented to person, place, and time.  Psychiatric:  Mood and Affect: Mood normal.        Behavior: Behavior normal.       Imaging: DG Chest 2 View  Result Date: 09/12/2019 CLINICAL DATA:  Shortness of breath. Coronavirus infection over the last 2 weeks. EXAM: CHEST - 2 VIEW COMPARISON:  02/19/2008. FINDINGS: Heart size is normal. Mediastinal shadows are normal. Mild patchy infiltrates and or volume loss in the mid and lower lungs. No dense consolidation or lobar collapse. No effusions. No significant bone finding. IMPRESSION: Mild patchy infiltrates and or volume loss in the mid and lower lungs. Electronically Signed   By: Nelson Chimes M.D.   On: 09/12/2019 11:25     Assessment & Plan:   History of COVID-19 Covid Pneumonia:  May continue Albuterol and nebulizers as needed  Continue flovent 2 puffs twice daily  Stay well hydrated  Stay active  Deep breathing exercises  May start vitamin C 2,000 mg daily, vitamin D3 2,000 IU daily, Zinc 220 mg daily, and Quercetin 500 mg twice daily  May take tylenol or fever or pain  May take mucinex DM twice daily  Will check follow up chest x ray  Hand out given on home PT exercises  Cough:  Continue gastroesophageal reflux disease treatment with elevating the head your bed and taking antacids  Continue taking  over-the-counter antihistamines and nasal fluticasone to help with allergic rhinitis  You need to try to suppress your cough to allow your larynx (voice box) to heal.  For three days don't talk, laugh, sing, or clear your throat. Do everything you can to suppress the cough during this time.  Take sips of water when needing to cough  Use hard candies (sugarless Jolly Ranchers) or non-mint or non-menthol containing cough drops during this time to soothe your throat.  Use a cough suppressant (Delsym) around the clock during this time.  After three days, gradually increase the use of your voice and back off on the cough suppressants.   Covid Quarantine:  Patient has completed 21 day quarantine period - continue to wear mask when around others     Follow up:  Follow up in 3-4 weeks or sooner if needed       Fenton Foy, NP 09/23/2019

## 2019-09-23 NOTE — Telephone Encounter (Signed)
Patient notified of results. Verbally understood. No additional questions

## 2019-09-23 NOTE — Telephone Encounter (Signed)
-----   Message from Fenton Foy, NP sent at 09/23/2019  4:20 PM EDT ----- Please call pt to let her know that her chest x ray showed no improvement. I placed a referral to pulmonary for her. She might need CT scan. I will let them decide. Thanks.

## 2019-09-23 NOTE — Patient Instructions (Addendum)
Covid Pneumonia:  May continue Albuterol and nebulizers as needed  Continue flovent 2 puffs twice daily  Stay well hydrated  Stay active  Deep breathing exercises  May start vitamin C 2,000 mg daily, vitamin D3 2,000 IU daily, Zinc 220 mg daily, and Quercetin 500 mg twice daily  May take tylenol or fever or pain  May take mucinex DM twice daily  Will check follow up chest x ray  Hand out given on home PT exercises  Cough:  Continue gastroesophageal reflux disease treatment with elevating the head your bed and taking antacids  Continue taking over-the-counter antihistamines and nasal fluticasone to help with allergic rhinitis  You need to try to suppress your cough to allow your larynx (voice box) to heal.  For three days don't talk, laugh, sing, or clear your throat. Do everything you can to suppress the cough during this time.  Take sips of water when needing to cough  Use hard candies (sugarless Jolly Ranchers) or non-mint or non-menthol containing cough drops during this time to soothe your throat.  Use a cough suppressant (Delsym) around the clock during this time.  After three days, gradually increase the use of your voice and back off on the cough suppressants.   Covid Quarantine:  Patient has completed 21 day quarantine period - continue to wear mask when around others     Follow up:  Follow up in 3-4 weeks or sooner if needed

## 2019-09-23 NOTE — Assessment & Plan Note (Signed)
Covid Pneumonia:  May continue Albuterol and nebulizers as needed  Continue flovent 2 puffs twice daily  Stay well hydrated  Stay active  Deep breathing exercises  May start vitamin C 2,000 mg daily, vitamin D3 2,000 IU daily, Zinc 220 mg daily, and Quercetin 500 mg twice daily  May take tylenol or fever or pain  May take mucinex DM twice daily  Will check follow up chest x ray  Hand out given on home PT exercises  Cough:  Continue gastroesophageal reflux disease treatment with elevating the head your bed and taking antacids  Continue taking over-the-counter antihistamines and nasal fluticasone to help with allergic rhinitis  You need to try to suppress your cough to allow your larynx (voice box) to heal.  For three days don't talk, laugh, sing, or clear your throat. Do everything you can to suppress the cough during this time.  Take sips of water when needing to cough  Use hard candies (sugarless Jolly Ranchers) or non-mint or non-menthol containing cough drops during this time to soothe your throat.  Use a cough suppressant (Delsym) around the clock during this time.  After three days, gradually increase the use of your voice and back off on the cough suppressants.   Covid Quarantine:  Patient has completed 21 day quarantine period - continue to wear mask when around others     Follow up:  Follow up in 3-4 weeks or sooner if needed

## 2019-09-25 ENCOUNTER — Other Ambulatory Visit: Payer: Self-pay | Admitting: Nurse Practitioner

## 2019-09-25 MED ORDER — PREDNISONE 10 MG PO TABS: ORAL_TABLET | ORAL | 0 refills | Status: DC

## 2019-09-30 ENCOUNTER — Telehealth: Payer: Commercial Managed Care - PPO

## 2019-10-03 ENCOUNTER — Emergency Department (HOSPITAL_BASED_OUTPATIENT_CLINIC_OR_DEPARTMENT_OTHER): Payer: Commercial Managed Care - PPO

## 2019-10-03 ENCOUNTER — Other Ambulatory Visit: Payer: Self-pay

## 2019-10-03 ENCOUNTER — Encounter (HOSPITAL_BASED_OUTPATIENT_CLINIC_OR_DEPARTMENT_OTHER): Payer: Self-pay

## 2019-10-03 ENCOUNTER — Emergency Department (HOSPITAL_BASED_OUTPATIENT_CLINIC_OR_DEPARTMENT_OTHER)
Admission: EM | Admit: 2019-10-03 | Discharge: 2019-10-03 | Disposition: A | Discharge status: Home / Self Care | Payer: Commercial Managed Care - PPO | Attending: Emergency Medicine | Admitting: Emergency Medicine

## 2019-10-03 DIAGNOSIS — U071 COVID-19: Secondary | ICD-10-CM | POA: Insufficient documentation

## 2019-10-03 DIAGNOSIS — J45909 Unspecified asthma, uncomplicated: Secondary | ICD-10-CM | POA: Diagnosis not present

## 2019-10-03 DIAGNOSIS — Z79899 Other long term (current) drug therapy: Secondary | ICD-10-CM | POA: Insufficient documentation

## 2019-10-03 DIAGNOSIS — J1282 Pneumonia due to coronavirus disease 2019: Secondary | ICD-10-CM | POA: Diagnosis not present

## 2019-10-03 DIAGNOSIS — M79675 Pain in left toe(s): Secondary | ICD-10-CM

## 2019-10-03 DIAGNOSIS — F17210 Nicotine dependence, cigarettes, uncomplicated: Secondary | ICD-10-CM | POA: Diagnosis not present

## 2019-10-03 MED ORDER — DOXYCYCLINE HYCLATE 100 MG PO CAPS: 100.0000 mg | ORAL_CAPSULE | Freq: Two times a day (BID) | ORAL | 0 refills | Status: AC

## 2019-10-03 NOTE — ED Triage Notes (Signed)
Pt arrives ambulatory to ED with c/o pain to left pinky toe states that she hit it on a chair about a week ago at home is concerned because she is a diabetic. Pt is also Covid + and having increasing SOB.

## 2019-10-03 NOTE — Discharge Instructions (Signed)
As we discussed here, your x-ray of your toe showed no evidence of fracture or dislocation of the bone.  You can apply hot compresses help with pain and swelling.  Your chest x-ray did show some continued evidence of pneumonia.  We will try to treat this with doxycycline.  This will also prevent infection in your foot.  Follow-up with the Covid care clinic.  Additionally, follow-up with a pulmonologist that he has previously has prescribed.  Return the emergency department for any persistent fever, worsening difficulty breathing, chest pain, any other worsening or concerning symptoms.

## 2019-10-03 NOTE — ED Provider Notes (Signed)
[facility] EMERGENCY DEPARTMENT Provider Note   CSN: [id number] Arrival date & time: 10/03/19  1145     History Chief Complaint  Patient presents with  . Shortness of Breath  . Toe Pain    Teresa Castaneda is a 49 y.o. female possible history of arthritis, asthma, back pain who presents for evaluation of 2 complaints.  She reports that about a week ago, she stubbed her left fifth digit on a wooden chair.  She states that since then, she has had some pain, swelling, redness noted to the toe.  She states he has not noted any drainage.  Denies any fever.  She also reports that she is diabetic so she is concerned about it getting infected.  She also consider a for evaluation of persistent and continued shortness of breath.  She states she was positive for COVID-19 on 09/01/2019.  She is diagnosed with Covid pneumonia about a week later.  She states that since then, she has continued to have some shortness of breath and coughing.  She has been prescribed Advair, albuterol and has been on a course of Omnicef.  She recently finished a course of Levaquin.  She states that despite these medications, she still feels like she is coughing and still feels short of breath.  She has been following up at the [facility] care clinic.  She has not noted any fevers, numbness/weakness of arms or legs, vomiting.  The history is provided by the patient.       Past Medical History:  Diagnosis Date  . Allergies   . Arthritis   . Asthma   . Back pain   . DJD (degenerative joint disease), lumbar     Patient Active Problem List   Diagnosis Date Noted  . History of COVID-19 09/23/2019  . Pneumonia due to COVID-19 virus 09/23/2019  . Cough 09/23/2019  . BMI 40.0-44.9, adult ([place]) 07/22/2019  . Facet arthropathy, lumbar 07/22/2019  . Sacroiliitis ([place]) 07/22/2019  . ANXIETY 09/10/2006  . DEPRESSION 09/10/2006  . ASTHMA 09/10/2006  . HEADACHE 09/10/2006    Past Surgical History:  Procedure  Laterality Date  . ABDOMINAL HYSTERECTOMY       OB History   No obstetric history on file.     Family History  Problem Relation Age of Onset  . Coronary artery disease Mother   . Diabetes Mother   . COPD Mother   . Coronary artery disease Father   . Hypertension Father     Social History   Tobacco Use  . Smoking status: Current Every Day Smoker    Packs/day: 0.50    Types: Cigarettes  . Smokeless tobacco: Never Used  Substance Use Topics  . Alcohol use: No  . Drug use: No    Home Medications Prior to Admission medications   Medication Sig Start Date End Date Taking? Authorizing Provider  albuterol (VENTOLIN HFA) 108 (90 Base) MCG/ACT inhaler Inhale 2 puffs into the lungs every 6 (six) hours as needed. For shortness of breath and wheezing 09/01/19   Ok Edwards, PA-C  ARMOUR THYROID 180 MG tablet Take 180 mg by mouth daily. 09/07/19   [provider]  azelastine (ASTELIN) 0.1 % nasal spray Place 2 sprays into both nostrils 2 (two) times daily. 04/23/19   [name], Amy V, PA-C  doxycycline (VIBRAMYCIN) 100 MG capsule Take 1 capsule (100 mg total) by mouth 2 (two) times daily for 7 days. 10/03/19 10/10/19  Volanda Napoleon, PA-C  glipiZIDE (GLUCOTROL)  10 MG tablet Take 1 tablet (10 mg total) by mouth daily before breakfast. 09/12/19   Scot Jun, FNP  ipratropium (ATROVENT) 0.03 % nasal spray Place 2 sprays into both nostrils 2 (two) times daily. 09/12/19   Scot Jun, FNP  ipratropium-albuterol (DUONEB) 0.5-2.5 (3) MG/3ML SOLN Take 3 mLs by nebulization every 6 (six) hours as needed. 09/12/19   Scot Jun, FNP  levocetirizine (XYZAL) 5 MG tablet Take 5 mg by mouth every evening.    [provider]  LORazepam (ATIVAN) 1 MG tablet Take 1 mg by mouth every 8 (eight) hours as needed for anxiety.    [provider]  montelukast (SINGULAIR) 10 MG tablet Take 10 mg by mouth at bedtime.    [provider]  omeprazole (PRILOSEC) 40 MG  capsule Take 40 mg by mouth daily. 09/25/19   [provider]  predniSONE (DELTASONE) 10 MG tablet Take 4 tabs for 2 days, then 3 tabs for 2 days, then 2 tabs for 2 days, then 1 tab for 2 days, then stop 09/25/19   Fenton Foy, NP  promethazine-dextromethorphan (PROMETHAZINE-DM) 6.25-15 MG/5ML syrup Take 5 mLs by mouth 4 (four) times daily as needed for cough. 09/09/19   Hall-Potvin, [place], PA-C    Allergies    Clarithromycin and Codeine  Review of Systems   Review of Systems  Constitutional: Negative for fever.  Respiratory: Positive for cough and shortness of breath.   Cardiovascular: Negative for chest pain.  Gastrointestinal: Negative for abdominal pain, nausea and vomiting.  Genitourinary: Negative for dysuria.  Musculoskeletal:       Left toe pain  Neurological: Negative for weakness and numbness.  All other systems reviewed and are negative.   Physical Exam Updated Vital Signs BP 119/79 (BP Location: Left Arm)   Pulse 86   Temp 98.7 F (37.1 C) (Oral)   Resp 18   Ht 5' 6"  (1.676 m)   Wt 121.6 kg   SpO2 97%   BMI 43.26 kg/m   Physical Exam Vitals and nursing note reviewed.  Constitutional:      Appearance: Normal appearance. She is well-developed.     Comments: Sitting comfortably on examination table  HENT:     Head: Normocephalic and atraumatic.  Eyes:     General: Lids are normal.     Conjunctiva/sclera: Conjunctivae normal.     Pupils: Pupils are equal, round, and reactive to light.  Cardiovascular:     Rate and Rhythm: Normal rate and regular rhythm.     Pulses: Normal pulses.          Dorsalis pedis pulses are 2+ on the right side and 2+ on the left side.     Heart sounds: Normal heart sounds. No murmur heard.  No friction rub. No gallop.   Pulmonary:     Effort: Pulmonary effort is normal.     Breath sounds: Rales present.     Comments: Intermittently coughing.  Mild crackles noted bilateral bases.  No evidence of respiratory distress.   Able to speak in full sentences with any difficulty. Abdominal:     Palpations: Abdomen is soft. Abdomen is not rigid.     Tenderness: There is no abdominal tenderness. There is no guarding.  Musculoskeletal:        General: Normal range of motion.     Cervical back: Full passive range of motion without pain.     Comments: Tenderness palpation of the left fifth digit.  No deformity  or crepitus noted.  Small scabbed over wound noted to the distal tip of the toe.  No surrounding warmth, drainage.  There are some mild erythema.  Skin:    General: Skin is warm and dry.     Capillary Refill: Capillary refill takes less than 2 seconds.     Comments: Good distal cap refill.  LLE is not dusky in appearance or cool to touch.  Neurological:     Mental Status: She is alert and oriented to person, place, and time.     Comments: Sensation intact along major nerve distributions of LLE  Psychiatric:        Speech: Speech normal.     ED Results / Procedures / Treatments   Labs (all labs ordered are listed, but only abnormal results are displayed) Labs Reviewed - No data to display  EKG EKG Interpretation  Date/Time:  Friday October 03 2019 12:09:01 EDT Ventricular Rate:  90 PR Interval:  148 QRS Duration: 74 QT Interval:  368 QTC Calculation: 450 R Axis:   44 Text Interpretation: Normal sinus rhythm Low voltage QRS Borderline ECG Confirmed by Lennice Sites [phone]) on 10/03/2019 12:16:23 PM   Radiology DG Chest Portable 1 View  Result Date: 10/03/2019 CLINICAL DATA:  Shortness of, COVID-19 positive EXAM: PORTABLE CHEST 1 VIEW COMPARISON:  09/23/2019 FINDINGS: Hazy bibasilar airspace disease. No other focal parenchymal opacity. No pleural effusion or pneumothorax. Stable cardiomediastinal silhouette. No aggressive osseous lesion. IMPRESSION: Hazy bibasilar airspace disease concerning for atelectasis versus infection given the patient's history of COVID-19 positivity. Electronically Signed    By: Kathreen Devoid   On: 10/03/2019 12:39   DG Toe 5th Left  Result Date: 10/03/2019 CLINICAL DATA:  Left fifth toe pain after hitting it on a chair about a week ago. EXAM: DG TOE 5TH LEFT-3 VIEWS COMPARISON:  None. FINDINGS: No convincing fracture. Developmental fusion of the fifth toe DIP joint. On the lateral view, there is a subtle periarticular erosion along the plantar aspect of the head of the proximal phalanx. No other arthropathic changes. No convincing findings of osteomyelitis. Diffuse soft tissue swelling.  No soft tissue air. IMPRESSION: 1. No acute fracture, dislocation or convincing osteomyelitis. 2. Diffuse soft tissue swelling. Electronically Signed   By: Lajean Manes M.D.   On: 10/03/2019 12:42    Procedures Procedures (including critical care time)  Medications Ordered in ED Medications - No data to display  ED Course  I have reviewed the triage vital signs and the nursing notes.  Pertinent labs & imaging results that were available during my care of the patient were reviewed by me and considered in my medical decision making (see chart for details).    MDM Rules/Calculators/A&P                          49 year old female who presents for evaluation of concerns of left toe injury as well as continued and persistent shortness of breath and cough.  Recently diagnosed with COVID-19.  Finished a course of [place] as well as Levaquin.  On initially arrival, she is afebrile, nontoxic-appearing.  Vital signs are stable.  She is not hypoxic.  On exam, she is intermittently coughing.  She has some rales noted.  She is able to speak in full sentences without any difficulty.  Her left fifth toe shows a small wound noted to the distal aspect.  It is scabbed over no active drainage.  No warmth.  No deformity or  crepitus noted.  Concern for sprain.  Also consider fracture.  I suspect her shortness of breath and cough or continued COVID-19 symptoms.  X-ray ordered at triage.  X-ray left  toe shows no evidence of acute fracture or dislocation.  Chest x-ray shows continued bilateral hazy opacities that could be infectious.  I discussed results with patient.  At this time, patient with no evidence of respiratory distress.  She is coughing but is otherwise stable.  I discussed with her trying a round of doxycycline that would both prevent infection in the toe as well as potentially help if there is any superimposed bacterial pneumonia.  Patient with history of hysterectomy. Patient is agreeable.  She has a follow-up appoint with a pulmonologist on the 27th.  I encouraged her to keep that appointment. At this time, patient exhibits no emergent life-threatening condition that require further evaluation in ED. Patient had ample opportunity for questions and discussion. All patient's questions were answered with full understanding. Strict return precautions discussed. Patient expresses understanding and agreement to plan.   Portions of this note were generated with [employer]obby[employer]. Dictation errors may occur despite best attempts at proofreading.   Final Clinical Impression(s) / ED Diagnoses Final diagnoses:  Toe pain, left  Pneumonia due to COVID-19 virus    Rx / DC Orders ED Discharge Orders         Ordered    doxycycline (VIBRAMYCIN) 100 MG capsule  2 times daily        10/03/19 1530           Volanda Napoleon, PA-C 10/03/19 1701    Lennice Sites, DO 10/04/19 [phone]

## 2019-10-13 ENCOUNTER — Ambulatory Visit (INDEPENDENT_AMBULATORY_CARE_PROVIDER_SITE_OTHER): Payer: Commercial Managed Care - PPO | Admitting: Pulmonary Disease

## 2019-10-13 ENCOUNTER — Encounter: Payer: Self-pay | Admitting: Pulmonary Disease

## 2019-10-13 ENCOUNTER — Other Ambulatory Visit: Payer: Self-pay

## 2019-10-13 VITALS — BP 116/80 | HR 96 | Temp 97.9°F | Ht 66.0 in | Wt 272.8 lb

## 2019-10-13 DIAGNOSIS — R0683 Snoring: Secondary | ICD-10-CM

## 2019-10-13 DIAGNOSIS — R0600 Dyspnea, unspecified: Secondary | ICD-10-CM

## 2019-10-13 DIAGNOSIS — J4540 Moderate persistent asthma, uncomplicated: Secondary | ICD-10-CM

## 2019-10-13 MED ORDER — TRELEGY ELLIPTA 100-62.5-25 MCG/INH IN AEPB: 1.0000 | INHALATION_SPRAY | Freq: Every day | RESPIRATORY_TRACT | 6 refills | Status: DC

## 2019-10-13 NOTE — Patient Instructions (Addendum)
-   Start trelegy ellipta inhaler, 1 puff daily (rinse mouth out with water after) - Stop advair inhaler once you get trelegy inhaler - Continue to use albuterol as needed - We will schedule you for pulmonary function tests, cardiac Echo and home sleep study - Please come back for labs in 4 weeks to check CBC and IgE level for your asthma - Follow up in 4 months

## 2019-10-13 NOTE — Progress Notes (Signed)
Synopsis: Referred by Lazaro Arms for shortness of breath  Subjective:   PATIENT ID: Teresa Castaneda GENDER: female DOB: 27-Mar-1970, MRN: [id number]   HPI  Chief Complaint  Patient presents with   Consult    SOB with activity and with the mask on   Teresa Castaneda is a 49 year old woman, daily smoker with history of asthma and recent Covid 19 pneumonia who is referred to pulmonary clinic for worsening shortness of breath.  She reports long history of asthma that has not been well controlled prior to her becoming ill with covid. She was on flovent daily and as needed albuterol. She reports having shortness of breath with chest tightness and wheezing with exposure to hot/humid weather, strong perfumes/colognes, and seasonal allergies. She also complains of sinus congestion and drainage. She also reports history of GERD but denies symptoms since she has started omeprazole.   She was vaccinated in April 2021 with the J&J vaccine. She tested positive for covid 19 on 09/01/19 after presenting to the ER. She did not receive monoclonal antibody infusions. She was sent home from the ED with albuterol inhaler and tessalon perles. She returned to the ER on 09/09/19 with shortness of breath and cough. She was started on prednisone at this time. She then returned to the ER on 09/12/19 for shortness of breath, bilateral ear pain and pressure. Repeat chest radiograph showed right lobar pneumonia and was given omnicef for 10 days and she was sent home with a nebulizer machine and duonebs. She then followed up with her PCP on 09/23/19 where a repeat chest radiograph was performed with no improvement noted.   She continues to experience exertional dyspnea with chest tightness in the center of her chest. She reports the prednisone has helped with her breathing as well as the duoneb nebulizer treatments.   She reports quitting smoking this past Wednesday as it has become difficult to smoke with her worsening  shortness of breath and cough. She is not using any nicotine replacement therapy.    She reports snoring at night. She has been waking up short of breath with wheezing requiring her albuterol. She does wake up with dry mouth and experiences fatigue throughout the day. She has not had a sleep study in the past due to concern for cost of the study.  Past Medical History:  Diagnosis Date   Allergies    Arthritis    Asthma    Back pain    DJD (degenerative joint disease), lumbar      Family History  Problem Relation Age of Onset   Coronary artery disease Mother    Diabetes Mother    COPD Mother    Coronary artery disease Father    Hypertension Father      Social History   Socioeconomic History   Marital status: Single    Spouse name: Not on file   Number of children: Not on file   Years of education: Not on file   Highest education level: Not on file  Occupational History   Not on file  Tobacco Use   Smoking status: Former Smoker    Packs/day: 0.50    Types: Cigarettes    Start date: 01/16/1985    Quit date: 10/08/2019    Years since quitting: 0.0   Smokeless tobacco: Never Used  Substance and Sexual Activity   Alcohol use: No   Drug use: No   Sexual activity: Not on file  Other Topics Concern   Not  on file  Social History Narrative   Not on file   Social Determinants of Health   Financial Resource Strain:    Difficulty of Paying Living Expenses: Not on file  Food Insecurity:    Worried About Charity fundraiser in the Last Year: Not on file   [employer] of Food in the Last Year: Not on file  Transportation Needs:    Lack of Transportation (Medical): Not on file   Lack of Transportation (Non-Medical): Not on file  Physical Activity:    Days of Exercise per Week: Not on file   Minutes of Exercise per Session: Not on file  Stress:    Feeling of Stress : Not on file  Social Connections:    Frequency of Communication with Friends and  Family: Not on file   Frequency of Social Gatherings with Friends and Family: Not on file   Attends Religious Services: Not on file   Active Member of [employer] or Organizations: Not on file   Attends Archivist Meetings: Not on file   Marital Status: Not on file  Intimate Partner Violence:    Fear of Current or Ex-Partner: Not on file   Emotionally Abused: Not on file   Physically Abused: Not on file   Sexually Abused: Not on file     Allergies  Allergen Reactions   Clarithromycin Other (See Comments)    Body cramps    Codeine Nausea And Vomiting     Outpatient Medications Prior to Visit  Medication Sig Dispense Refill   albuterol (VENTOLIN HFA) 108 (90 Base) MCG/ACT inhaler Inhale 2 puffs into the lungs every 6 (six) hours as needed. For shortness of breath and wheezing 18 g 0   ARMOUR THYROID 180 MG tablet Take 180 mg by mouth daily.     azelastine (ASTELIN) 0.1 % nasal spray Place 2 sprays into both nostrils 2 (two) times daily. 30 mL 0   Fluticasone-Salmeterol (ADVAIR) 250-50 MCG/DOSE AEPB SMARTSIG:By Mouth     glipiZIDE (GLUCOTROL) 10 MG tablet Take 1 tablet (10 mg total) by mouth daily before breakfast. (Patient taking differently: Take 5 mg by mouth daily before breakfast. ) 30 tablet 1   ipratropium (ATROVENT) 0.03 % nasal spray Place 2 sprays into both nostrils 2 (two) times daily. 30 mL 0   ipratropium-albuterol (DUONEB) 0.5-2.5 (3) MG/3ML SOLN Take 3 mLs by nebulization every 6 (six) hours as needed. 360 mL 1   levocetirizine (XYZAL) 5 MG tablet Take 5 mg by mouth every evening.     LORazepam (ATIVAN) 1 MG tablet Take 1 mg by mouth every 8 (eight) hours as needed for anxiety.     montelukast (SINGULAIR) 10 MG tablet Take 10 mg by mouth at bedtime.     omeprazole (PRILOSEC) 40 MG capsule Take 40 mg by mouth daily.     predniSONE (DELTASONE) 10 MG tablet Take 4 tabs for 2 days, then 3 tabs for 2 days, then 2 tabs for 2 days, then 1 tab for 2  days, then stop 20 tablet 0   promethazine-dextromethorphan (PROMETHAZINE-DM) 6.25-15 MG/5ML syrup Take 5 mLs by mouth 4 (four) times daily as needed for cough. 118 mL 0   No facility-administered medications prior to visit.    Review of Systems  Constitutional: Positive for malaise/fatigue. Negative for chills, diaphoresis, fever and weight loss.  HENT: Negative for congestion, ear pain, nosebleeds, sinus pain and sore throat.   Eyes: Negative for blurred vision.  Respiratory: Positive for cough,  sputum production, shortness of breath and wheezing. Negative for hemoptysis.   Cardiovascular: Positive for chest pain. Negative for palpitations, orthopnea, claudication, leg swelling and PND.  Gastrointestinal: Negative for abdominal pain, blood in stool, heartburn, nausea and vomiting.  Genitourinary: Negative for hematuria.  Musculoskeletal: Negative for joint pain and myalgias.  Skin: Negative for itching and rash.  Neurological: Negative for dizziness, weakness and headaches.  Endo/Heme/Allergies: Does not bruise/bleed easily.  Psychiatric/Behavioral: Negative.    Objective:   Vitals:   10/13/19 1106  BP: 116/80  Pulse: 96  Temp: 97.9 F (36.6 C)  TempSrc: Oral  SpO2: 95%  Weight: 272 lb 12.8 oz (123.7 kg)  Height: 5' 6"  (1.676 m)     Physical Exam Constitutional:      General: She is not in acute distress.    Appearance: She is obese.  HENT:     Head: Normocephalic and atraumatic.     Nose: Nose normal.     Mouth/Throat:     Mouth: Mucous membranes are moist.     Pharynx: Oropharynx is clear.  Eyes:     General: No scleral icterus.    Extraocular Movements: Extraocular movements intact.     Conjunctiva/sclera: Conjunctivae normal.     Pupils: Pupils are equal, round, and reactive to light.  Cardiovascular:     Rate and Rhythm: Normal rate and regular rhythm.     Pulses: Normal pulses.     Heart sounds: Normal heart sounds. No murmur heard.   Pulmonary:      Effort: Pulmonary effort is normal.     Breath sounds: No wheezing, rhonchi or rales.  Abdominal:     General: Bowel sounds are normal. There is no distension.     Tenderness: There is no abdominal tenderness.  Musculoskeletal:     Right lower leg: No edema.     Left lower leg: No edema.  Skin:    General: Skin is warm and dry.     Findings: No rash.  Neurological:     General: No focal deficit present.     Mental Status: She is alert and oriented to person, place, and time. Mental status is at baseline.  Psychiatric:        Mood and Affect: Mood normal.        Behavior: Behavior normal.        Thought Content: Thought content normal.        Judgment: Judgment normal.     CBC    Component Value Date/Time   WBC 9.0 07/28/2019 1459   WBC 6.5 02/19/2008 0910   RBC 4.79 07/28/2019 1459   RBC 5.13 (H) 02/19/2008 0910   HGB 14.4 07/28/2019 1459   HCT 41.5 07/28/2019 1459   PLT 129 (L) 07/28/2019 1459   MCV 87 07/28/2019 1459   MCH 30.1 07/28/2019 1459   MCHC 34.7 07/28/2019 1459   MCHC 33.6 02/19/2008 0910   RDW 12.9 07/28/2019 1459   LYMPHSABS 3.5 (H) 07/28/2019 1459   MONOABS 0.5 02/19/2008 0910   EOSABS 0.4 07/28/2019 1459   BASOSABS 0.0 07/28/2019 1459   BMP Latest Ref Rng & Units 07/28/2019 01/13/2007  Glucose 65 - 99 mg/dL 104(H) 92  BUN 6 - 24 mg/dL 11 9  Creatinine 0.57 - 1.00 mg/dL 0.75 0.6  BUN/Creat Ratio 9 - 23 15 -  Sodium 134 - 144 mmol/L 141 137  Potassium 3.5 - 5.2 mmol/L 4.1 3.9  Chloride 96 - 106 mmol/L 104 108  CO2 20 -  29 mmol/L 24 -  Calcium 8.7 - 10.2 mg/dL 9.1 -    Chest imaging:  CXR 09/12/19  Heart size is normal. Mediastinal shadows are normal. Mild patchy infiltrates and or volume loss in the mid and lower lungs. No dense consolidation or lobar collapse. No effusions. No significant bone finding.  09/23/19 The heart size and mediastinal contours are unchanged. Persistent trace bi basilar airspace opacities. No new focal consolidation.  The visualized skeletal structures are unremarkable.  10/03/19: Hazy bibasilar airspace disease. No other focal parenchymal opacity. No pleural effusion or pneumothorax. Stable cardiomediastinal silhouette. No aggressive osseous lesion.    PFT: No PFTs on file  Labs: Reviewed as above  Assessment & Plan:   Moderate persistent asthma without complication - Plan: Pulmonary Function Test, CBC with Differential, IgE, Fluticasone-Umeclidin-Vilant (TRELEGY ELLIPTA) 100-62.5-25 MCG/INH AEPB  Dyspnea, unspecified type - Plan: ECHOCARDIOGRAM COMPLETE  Snoring - Plan: Home sleep test  Discussion: Teresa Castaneda is a 49 year old woman, daily smoker with history of asthma and recent Covid 19 pneumonia who is referred to pulmonary clinic for worsening shortness of breath.  Her shortness of breath appears multifactorial in setting of uncontrolled asthma, significant smoking history, recent covid 19 pneumonia and possible underlying sleep apnea.   She was previously on flovent and albuterol as needed for her asthma and does not report well controlled symptoms on that regimen. She is to start trelegy ellipta due to how symptomatic she has been. She is to stop advair once the trelegy is approved. Continue montelukast. We will check complete pulmonary function tests. She has history of elevated peripheral eosinophil levels. We will repeat a CBC and IgE level in case she were to require qualification for a biologic agent in the future for her asthma.    We will check a home sleep study given the concern for sleep apnea given her snoring, fatigue, shortness of breath at night and obesity.   We will check an echocardiogram to further evaluate her dyspnea.   She is to follow up in 4 months  Freda Jackson, MD [facility] Office: [phone]   Current Outpatient Medications:    albuterol (VENTOLIN HFA) 108 (90 Base) MCG/ACT inhaler, Inhale 2 puffs into the lungs every 6 (six)  hours as needed. For shortness of breath and wheezing, Disp: 18 g, Rfl: 0   ARMOUR THYROID 180 MG tablet, Take 180 mg by mouth daily., Disp: , Rfl:    azelastine (ASTELIN) 0.1 % nasal spray, Place 2 sprays into both nostrils 2 (two) times daily., Disp: 30 mL, Rfl: 0   Fluticasone-Salmeterol (ADVAIR) 250-50 MCG/DOSE AEPB, SMARTSIG:By Mouth, Disp: , Rfl:    glipiZIDE (GLUCOTROL) 10 MG tablet, Take 1 tablet (10 mg total) by mouth daily before breakfast. (Patient taking differently: Take 5 mg by mouth daily before breakfast. ), Disp: 30 tablet, Rfl: 1   ipratropium (ATROVENT) 0.03 % nasal spray, Place 2 sprays into both nostrils 2 (two) times daily., Disp: 30 mL, Rfl: 0   ipratropium-albuterol (DUONEB) 0.5-2.5 (3) MG/3ML SOLN, Take 3 mLs by nebulization every 6 (six) hours as needed., Disp: 360 mL, Rfl: 1   levocetirizine (XYZAL) 5 MG tablet, Take 5 mg by mouth every evening., Disp: , Rfl:    LORazepam (ATIVAN) 1 MG tablet, Take 1 mg by mouth every 8 (eight) hours as needed for anxiety., Disp: , Rfl:    montelukast (SINGULAIR) 10 MG tablet, Take 10 mg by mouth at bedtime., Disp: , Rfl:  omeprazole (PRILOSEC) 40 MG capsule, Take 40 mg by mouth daily., Disp: , Rfl:    predniSONE (DELTASONE) 10 MG tablet, Take 4 tabs for 2 days, then 3 tabs for 2 days, then 2 tabs for 2 days, then 1 tab for 2 days, then stop, Disp: 20 tablet, Rfl: 0   promethazine-dextromethorphan (PROMETHAZINE-DM) 6.25-15 MG/5ML syrup, Take 5 mLs by mouth 4 (four) times daily as needed for cough., Disp: 118 mL, Rfl: 0   Fluticasone-Umeclidin-Vilant (TRELEGY ELLIPTA) 100-62.5-25 MCG/INH AEPB, Inhale 1 puff into the lungs daily., Disp: 1 each, Rfl: 6

## 2019-10-15 ENCOUNTER — Ambulatory Visit (INDEPENDENT_AMBULATORY_CARE_PROVIDER_SITE_OTHER): Payer: Commercial Managed Care - PPO | Admitting: Pulmonary Disease

## 2019-10-15 ENCOUNTER — Other Ambulatory Visit: Payer: Self-pay

## 2019-10-15 DIAGNOSIS — J4540 Moderate persistent asthma, uncomplicated: Secondary | ICD-10-CM | POA: Diagnosis not present

## 2019-10-15 LAB — PULMONARY FUNCTION TEST
DL/VA % pred: 135 %
DL/VA: 5.78 ml/min/mmHg/L
DLCO cor % pred: 121 %
DLCO cor: 27.64 ml/min/mmHg
DLCO unc % pred: 121 %
DLCO unc: 27.64 ml/min/mmHg
FEF 25-75 Post: 2.66 L/sec
FEF 25-75 Pre: 2.76 L/sec
FEF2575-%Change-Post: -3 %
FEF2575-%Pred-Post: 88 %
FEF2575-%Pred-Pre: 92 %
FEV1-%Change-Post: 0 %
FEV1-%Pred-Post: 77 %
FEV1-%Pred-Pre: 77 %
FEV1-Post: 2.36 L
FEV1-Pre: 2.39 L
FEV1FVC-%Change-Post: 1 %
FEV1FVC-%Pred-Pre: 103 %
FEV6-%Change-Post: -2 %
FEV6-%Pred-Post: 74 %
FEV6-%Pred-Pre: 75 %
FEV6-Post: 2.79 L
FEV6-Pre: 2.85 L
FEV6FVC-%Pred-Post: 102 %
FEV6FVC-%Pred-Pre: 102 %
FVC-%Change-Post: -2 %
FVC-%Pred-Post: 72 %
FVC-%Pred-Pre: 74 %
FVC-Post: 2.79 L
FVC-Pre: 2.85 L
Post FEV1/FVC ratio: 85 %
Post FEV6/FVC ratio: 100 %
Pre FEV1/FVC ratio: 84 %
Pre FEV6/FVC Ratio: 100 %
RV % pred: 92 %
RV: 1.69 L
TLC % pred: 89 %
TLC: 4.79 L

## 2019-10-15 NOTE — Progress Notes (Signed)
PFT done today. 

## 2019-10-20 ENCOUNTER — Ambulatory Visit (HOSPITAL_COMMUNITY)
Admission: RE | Admit: 2019-10-20 | Discharge: 2019-10-20 | Disposition: A | Discharge status: Home / Self Care | Payer: Commercial Managed Care - PPO | Source: Ambulatory Visit | Attending: Pulmonary Disease | Admitting: Pulmonary Disease

## 2019-10-20 ENCOUNTER — Other Ambulatory Visit: Payer: Self-pay

## 2019-10-20 DIAGNOSIS — R0600 Dyspnea, unspecified: Secondary | ICD-10-CM | POA: Diagnosis present

## 2019-10-20 LAB — ECHOCARDIOGRAM COMPLETE
Area-P 1/2: 3.37 cm2
S' Lateral: 2.7 cm

## 2019-10-20 NOTE — Progress Notes (Signed)
  Echocardiogram 2D Echocardiogram has been performed.  Teresa Castaneda 10/20/2019, 8:55 AM

## 2019-11-07 ENCOUNTER — Other Ambulatory Visit: Payer: Self-pay

## 2019-11-07 ENCOUNTER — Ambulatory Visit: Payer: Commercial Managed Care - PPO

## 2019-11-07 ENCOUNTER — Other Ambulatory Visit (INDEPENDENT_AMBULATORY_CARE_PROVIDER_SITE_OTHER): Payer: Commercial Managed Care - PPO

## 2019-11-07 DIAGNOSIS — R0683 Snoring: Secondary | ICD-10-CM

## 2019-11-07 DIAGNOSIS — J4540 Moderate persistent asthma, uncomplicated: Secondary | ICD-10-CM

## 2019-11-07 LAB — CBC WITH DIFFERENTIAL/PLATELET
Basophils Absolute: 0.1 10*3/uL (ref 0.0–0.1)
Basophils Relative: 0.9 % (ref 0.0–3.0)
Eosinophils Absolute: 0.4 10*3/uL (ref 0.0–0.7)
Eosinophils Relative: 5 % (ref 0.0–5.0)
HCT: 40.2 % (ref 36.0–46.0)
Hemoglobin: 13.5 g/dL (ref 12.0–15.0)
Lymphocytes Relative: 42.4 % (ref 12.0–46.0)
Lymphs Abs: 3.1 10*3/uL (ref 0.7–4.0)
MCHC: 33.6 g/dL (ref 30.0–36.0)
MCV: 86 fl (ref 78.0–100.0)
Monocytes Absolute: 0.3 10*3/uL (ref 0.1–1.0)
Monocytes Relative: 4.3 % (ref 3.0–12.0)
Neutro Abs: 3.5 10*3/uL (ref 1.4–7.7)
Neutrophils Relative %: 47.4 % (ref 43.0–77.0)
Platelets: 122 10*3/uL — ABNORMAL LOW (ref 150.0–400.0)
RBC: 4.67 Mil/uL (ref 3.87–5.11)
RDW: 14.2 % (ref 11.5–15.5)
WBC: 7.4 10*3/uL (ref 4.0–10.5)

## 2019-11-10 LAB — IGE: IgE (Immunoglobulin E), Serum: 621 kU/L — ABNORMAL HIGH (ref <=114)

## 2019-11-11 DIAGNOSIS — R0683 Snoring: Secondary | ICD-10-CM

## 2019-11-17 ENCOUNTER — Encounter: Payer: Self-pay | Admitting: Pulmonary Disease

## 2019-11-17 ENCOUNTER — Other Ambulatory Visit: Payer: Self-pay

## 2019-11-17 ENCOUNTER — Ambulatory Visit: Payer: Commercial Managed Care - PPO | Admitting: Pulmonary Disease

## 2019-11-17 VITALS — BP 122/78 | HR 101 | Temp 97.5°F | Ht 66.0 in | Wt 270.8 lb

## 2019-11-17 DIAGNOSIS — R0600 Dyspnea, unspecified: Secondary | ICD-10-CM | POA: Diagnosis not present

## 2019-11-17 DIAGNOSIS — G4734 Idiopathic sleep related nonobstructive alveolar hypoventilation: Secondary | ICD-10-CM

## 2019-11-17 DIAGNOSIS — D696 Thrombocytopenia, unspecified: Secondary | ICD-10-CM

## 2019-11-17 DIAGNOSIS — R748 Abnormal levels of other serum enzymes: Secondary | ICD-10-CM | POA: Diagnosis not present

## 2019-11-17 DIAGNOSIS — J4540 Moderate persistent asthma, uncomplicated: Secondary | ICD-10-CM

## 2019-11-17 DIAGNOSIS — Z6841 Body Mass Index (BMI) 40.0 and over, adult: Secondary | ICD-10-CM

## 2019-11-17 DIAGNOSIS — R0609 Other forms of dyspnea: Secondary | ICD-10-CM

## 2019-11-17 MED ORDER — FUROSEMIDE 20 MG PO TABS: 10.0000 mg | ORAL_TABLET | Freq: Every day | ORAL | 0 refills | Status: DC

## 2019-11-17 NOTE — Patient Instructions (Addendum)
Start 2L supplemental Oxygen at night Use albuterol as needed for shortness of breath and monitor for improvement We will refer you to cardiology for further evaluation of dyspnea We will check hepatitis panel and RUQ [place] for your low platelets Come back on 11/24/19 to check labs

## 2019-11-17 NOTE — Progress Notes (Signed)
Synopsis: Returns for shortness of breath  Subjective:   PATIENT ID: Teresa Castaneda GENDER: female DOB: [birth year], MRN: [id number]   HPI  Chief Complaint  Patient presents with  . Acute Visit    She states her breathing is no better since the last visit 10/13/19- she gets SOB walking approx 25 ft. She is using her albuterol inhaler once per wk on average.     Teresa Castaneda is a 49 year old woman, recent former smoker with asthma and history Covid 19 pneumonia who returns to pulmonary clinic with ongoing exertional dyspnea.   She reports her wheezing is significantly better since starting the trelegy inhaler. She reports her breathing is better overall but is concerned about the shortness of breath and some chest tightness with exertion. She does report a dry cough.   Recent labs show an IgE level of 621 and absolute eosinophil count of 400. CBC also indicates low platelets of 122k and 129k over the past two lab draws in October and July. She also had elevated LFTs in July, AST 41/ALT 58.     Pulmonary function tests show normal spirometry, lung volumes and DLCO and no significant change to bronchodilators.    She had an echo done on 10/20/19 which showed normal EF 02-63%, grade I diastolic dysfunction. Normal RV systolic function and RVSP of 27.4 mmHg. Mildly dilated left atrial size.   She had a home sleep study done recently which showed an AHI of 2.9 and 50.5 minutes out of 370 minutes she had oxygen desaturations below 89%.      Past Medical History:  Diagnosis Date  . Allergies   . Arthritis   . Asthma   . Back pain   . DJD (degenerative joint disease), lumbar      Family History  Problem Relation Age of Onset  . Coronary artery disease Mother   . Diabetes Mother   . COPD Mother   . Coronary artery disease Father   . Hypertension Father      Social History   Socioeconomic History  . Marital status: Single    Spouse name: Not on file  . Number of children: Not  on file  . Years of education: Not on file  . Highest education level: Not on file  Occupational History  . Not on file  Tobacco Use  . Smoking status: Former Smoker    Packs/day: 0.50    Types: Cigarettes    Start date: 01/16/1985    Quit date: 10/08/2019    Years since quitting: 0.1  . Smokeless tobacco: Never Used  Substance and Sexual Activity  . Alcohol use: No  . Drug use: No  . Sexual activity: Not on file  Other Topics Concern  . Not on file  Social History Narrative  . Not on file   Social Determinants of Health   Financial Resource Strain:   . Difficulty of Paying Living Expenses: Not on file  Food Insecurity:   . Worried About Charity fundraiser in the Last Year: Not on file  . Ran Out of Food in the Last Year: Not on file  Transportation Needs:   . Lack of Transportation (Medical): Not on file  . Lack of Transportation (Non-Medical): Not on file  Physical Activity:   . Days of Exercise per Week: Not on file  . Minutes of Exercise per Session: Not on file  Stress:   . Feeling of Stress : Not on file  Social Connections:   .  Frequency of Communication with Friends and Family: Not on file  . Frequency of Social Gatherings with Friends and Family: Not on file  . Attends Religious Services: Not on file  . Active Member of Clubs or Organizations: Not on file  . Attends Archivist Meetings: Not on file  . Marital Status: Not on file  Intimate Partner Violence:   . Fear of Current or Ex-Partner: Not on file  . Emotionally Abused: Not on file  . Physically Abused: Not on file  . Sexually Abused: Not on file     Allergies  Allergen Reactions  . Clarithromycin Other (See Comments)    Body cramps   . Codeine Nausea And Vomiting     Outpatient Medications Prior to Visit  Medication Sig Dispense Refill  . glipiZIDE (GLUCOTROL) 5 MG tablet Take 5 mg by mouth daily before breakfast.    . albuterol (VENTOLIN HFA) 108 (90 Base) MCG/ACT inhaler Inhale 2  puffs into the lungs every 6 (six) hours as needed. For shortness of breath and wheezing 18 g 0  . ARMOUR THYROID 180 MG tablet Take 180 mg by mouth daily.    Marland Kitchen azelastine (ASTELIN) 0.1 % nasal spray Place 2 sprays into both nostrils 2 (two) times daily. 30 mL 0  . Fluticasone-Umeclidin-Vilant (TRELEGY ELLIPTA) 100-62.5-25 MCG/INH AEPB Inhale 1 puff into the lungs daily. 1 each 6  . levocetirizine (XYZAL) 5 MG tablet Take 5 mg by mouth every evening.    Marland Kitchen LORazepam (ATIVAN) 1 MG tablet Take 1 mg by mouth every 8 (eight) hours as needed for anxiety.    . montelukast (SINGULAIR) 10 MG tablet Take 10 mg by mouth at bedtime.    Marland Kitchen omeprazole (PRILOSEC) 40 MG capsule Take 40 mg by mouth daily.    . Fluticasone-Salmeterol (ADVAIR) 250-50 MCG/DOSE AEPB SMARTSIG:By Mouth    . glipiZIDE (GLUCOTROL) 10 MG tablet Take 1 tablet (10 mg total) by mouth daily before breakfast. (Patient taking differently: Take 5 mg by mouth daily before breakfast. ) 30 tablet 1  . ipratropium (ATROVENT) 0.03 % nasal spray Place 2 sprays into both nostrils 2 (two) times daily. 30 mL 0  . ipratropium-albuterol (DUONEB) 0.5-2.5 (3) MG/3ML SOLN Take 3 mLs by nebulization every 6 (six) hours as needed. 360 mL 1  . predniSONE (DELTASONE) 10 MG tablet Take 4 tabs for 2 days, then 3 tabs for 2 days, then 2 tabs for 2 days, then 1 tab for 2 days, then stop 20 tablet 0  . promethazine-dextromethorphan (PROMETHAZINE-DM) 6.25-15 MG/5ML syrup Take 5 mLs by mouth 4 (four) times daily as needed for cough. 118 mL 0   No facility-administered medications prior to visit.    Review of Systems  Constitutional: Positive for malaise/fatigue. Negative for chills, diaphoresis, fever and weight loss.  HENT: Negative for congestion, ear pain, nosebleeds, sinus pain and sore throat.   Eyes: Negative for blurred vision.  Respiratory: Positive for cough and shortness of breath. Negative for hemoptysis, sputum production and wheezing.   Cardiovascular:  Positive for chest pain. Negative for palpitations, orthopnea, claudication, leg swelling and PND.  Gastrointestinal: Negative for abdominal pain, blood in stool, heartburn, nausea and vomiting.  Genitourinary: Negative for hematuria.  Musculoskeletal: Negative for joint pain and myalgias.  Skin: Negative for itching and rash.  Neurological: Negative for dizziness, weakness and headaches.  Endo/Heme/Allergies: Does not bruise/bleed easily.  Psychiatric/Behavioral: Negative.    Objective:   Vitals:   11/17/19 1054  BP: 122/78  Pulse: Marland Kitchen)  101  Temp: (!) 97.5 F (36.4 C)  TempSrc: Oral  SpO2: 97%  Weight: 270 lb 12.8 oz (122.8 kg)  Height: 5' 6"  (1.676 m)     Physical Exam Constitutional:      General: She is not in acute distress.    Appearance: She is obese.  HENT:     Head: Normocephalic and atraumatic.     Nose: Nose normal.     Mouth/Throat:     Mouth: Mucous membranes are moist.     Pharynx: Oropharynx is clear.  Eyes:     General: No scleral icterus.    Extraocular Movements: Extraocular movements intact.     Conjunctiva/sclera: Conjunctivae normal.     Pupils: Pupils are equal, round, and reactive to light.  Cardiovascular:     Rate and Rhythm: Normal rate and regular rhythm.     Pulses: Normal pulses.     Heart sounds: Normal heart sounds. No murmur heard.   Pulmonary:     Effort: Pulmonary effort is normal.     Breath sounds: No wheezing, rhonchi or rales.  Abdominal:     General: Bowel sounds are normal. There is no distension.     Tenderness: There is no abdominal tenderness.  Musculoskeletal:     Right lower leg: Edema (trace) present.     Left lower leg: Edema (trace) present.  Skin:    General: Skin is warm and dry.     Findings: No rash.  Neurological:     General: No focal deficit present.     Mental Status: She is alert and oriented to person, place, and time. Mental status is at baseline.  Psychiatric:        Mood and Affect: Mood normal.         Behavior: Behavior normal.        Thought Content: Thought content normal.        Judgment: Judgment normal.     CBC    Component Value Date/Time   WBC 7.4 11/07/2019 1515   RBC 4.67 11/07/2019 1515   HGB 13.5 11/07/2019 1515   HGB 14.4 07/28/2019 1459   HCT 40.2 11/07/2019 1515   HCT 41.5 07/28/2019 1459   PLT 122.0 (L) 11/07/2019 1515   PLT 129 (L) 07/28/2019 1459   MCV 86.0 11/07/2019 1515   MCV 87 07/28/2019 1459   MCH 30.1 07/28/2019 1459   MCHC 33.6 11/07/2019 1515   RDW 14.2 11/07/2019 1515   RDW 12.9 07/28/2019 1459   LYMPHSABS 3.1 11/07/2019 1515   LYMPHSABS 3.5 (H) 07/28/2019 1459   MONOABS 0.3 11/07/2019 1515   EOSABS 0.4 11/07/2019 1515   EOSABS 0.4 07/28/2019 1459   BASOSABS 0.1 11/07/2019 1515   BASOSABS 0.0 07/28/2019 1459   BMP Latest Ref Rng & Units 07/28/2019 01/13/2007  Glucose 65 - 99 mg/dL 104(H) 92  BUN 6 - 24 mg/dL 11 9  Creatinine 0.57 - 1.00 mg/dL 0.75 0.6  BUN/Creat Ratio 9 - 23 15 -  Sodium 134 - 144 mmol/L 141 137  Potassium 3.5 - 5.2 mmol/L 4.1 3.9  Chloride 96 - 106 mmol/L 104 108  CO2 20 - 29 mmol/L 24 -  Calcium 8.7 - 10.2 mg/dL 9.1 -    Chest imaging:  CXR 09/12/19  Heart size is normal. Mediastinal shadows are normal. Mild patchy infiltrates and or volume loss in the mid and lower lungs. No dense consolidation or lobar collapse. No effusions. No significant bone finding.  09/23/19 The heart size  and mediastinal contours are unchanged. Persistent trace bi basilar airspace opacities. No new focal consolidation. The visualized skeletal structures are unremarkable.  10/03/19: Hazy bibasilar airspace disease. No other focal parenchymal opacity. No pleural effusion or pneumothorax. Stable cardiomediastinal silhouette. No aggressive osseous lesion.    PFT: Post FEV1/FVC: 85 FEV1: 2.36L (77%) FVC: 2.79L (72%) TLC: 4.79L (89%) DLCO 121%  Labs: Reviewed as above  ECHO 10/20/19 1. Left ventricular ejection fraction, by  estimation, is 65 to 70%. The  left ventricle has normal function. The left ventricle has no regional  wall motion abnormalities. There is mild concentric left ventricular  hypertrophy. Left ventricular diastolic  parameters are consistent with Grade I diastolic dysfunction (impaired  relaxation). Elevated left atrial pressure.  2. Right ventricular systolic function is normal. The right ventricular  size is normal. There is normal pulmonary artery systolic pressure. The  estimated right ventricular systolic pressure is 15.1 mmHg.  3. Left atrial size was mildly dilated.  4. The mitral valve is normal in structure. Mild mitral valve  regurgitation. No evidence of mitral stenosis.  5. The aortic valve is normal in structure. Aortic valve regurgitation is  not visualized. No aortic stenosis is present.  6. The inferior vena cava is normal in size with greater than 50%  respiratory variability, suggesting right atrial pressure of 3 mmHg.   Assessment & Plan:   Moderate persistent asthma without complication - Plan: Ambulatory Referral for DME  Thrombocytopenia ([place]) - Plan: Ambulatory Referral for DME  Elevated liver enzymes - Plan: Hepatitis, Acute, US Abdomen Limited RUQ (LIVER/GB), Ambulatory Referral for DME  Dyspnea on exertion - Plan: Ambulatory referral to Cardiology, Basic Metabolic Panel (BMET), Ambulatory Referral for DME  Class 3 severe obesity with serious comorbidity and body mass index (BMI) of 40.0 to 44.9 in adult, unspecified obesity type ([place]) - Plan: Ambulatory Referral for DME  Discussion: Teresa Castaneda is a 49 year old woman, recent former smoker with asthma and history Covid 19 pneumonia who returns to pulmonary clinic with ongoing exertional dyspnea.   Her asthma symptoms are under better control at this time. We will continue to monitor her IgE levels and eosinophil count based on her symptoms. Her pulmonary function tests are normal. Her sleep study does not  warrant CPAP therapy but she does have nocturnal desaturations in which we will order nocturnal oximetry to see if we can qualify her for supplemental oxygen at night. We will trial her on 6m of lasix daily and monitor if this improves her symptoms. We will check a BMP in 1 week to monitor potassium and Cr levels.  Given the persistent exertional dyspnea and chest tightness we will refer her to cardiology for further evaluation given family history of coronary artery disease and her history of obesity and smoking.    She has thrombocytopenia noted on recent CBCs in October and July of this year. We will check a hepatitis panel and a RUQ [place] evaluate for underlying liver disease. We discussed a referral to hematology for further evaluation but she preferred to wait at this time until the other testing is complete.   She is to follow up in 3 months as previously scheduled  JFreda Jackson MD [facility] Office: [phone]  Current Outpatient Medications:  .  glipiZIDE (GLUCOTROL) 5 MG tablet, Take 5 mg by mouth daily before breakfast., Disp: , Rfl:  .  albuterol (VENTOLIN HFA) 108 (90 Base) MCG/ACT inhaler, Inhale 2 puffs into the lungs  every 6 (six) hours as needed. For shortness of breath and wheezing, Disp: 18 g, Rfl: 0 .  ARMOUR THYROID 180 MG tablet, Take 180 mg by mouth daily., Disp: , Rfl:  .  azelastine (ASTELIN) 0.1 % nasal spray, Place 2 sprays into both nostrils 2 (two) times daily., Disp: 30 mL, Rfl: 0 .  Fluticasone-Umeclidin-Vilant (TRELEGY ELLIPTA) 100-62.5-25 MCG/INH AEPB, Inhale 1 puff into the lungs daily., Disp: 1 each, Rfl: 6 .  furosemide (LASIX) 20 MG tablet, Take 0.5 tablets (10 mg total) by mouth daily., Disp: 15 tablet, Rfl: 0 .  levocetirizine (XYZAL) 5 MG tablet, Take 5 mg by mouth every evening., Disp: , Rfl:  .  LORazepam (ATIVAN) 1 MG tablet, Take 1 mg by mouth every 8 (eight) hours as needed for anxiety., Disp: , Rfl:  .  montelukast  (SINGULAIR) 10 MG tablet, Take 10 mg by mouth at bedtime., Disp: , Rfl:  .  omeprazole (PRILOSEC) 40 MG capsule, Take 40 mg by mouth daily., Disp: , Rfl:

## 2019-11-18 ENCOUNTER — Encounter: Payer: Self-pay | Admitting: Pulmonary Disease

## 2019-11-21 ENCOUNTER — Ambulatory Visit
Admission: RE | Admit: 2019-11-21 | Discharge: 2019-11-21 | Disposition: A | Discharge status: Home / Self Care | Payer: Commercial Managed Care - PPO | Source: Ambulatory Visit | Attending: Pulmonary Disease | Admitting: Pulmonary Disease

## 2019-11-21 DIAGNOSIS — R748 Abnormal levels of other serum enzymes: Secondary | ICD-10-CM

## 2019-11-28 ENCOUNTER — Telehealth: Payer: Self-pay | Admitting: Pulmonary Disease

## 2019-11-28 ENCOUNTER — Other Ambulatory Visit (INDEPENDENT_AMBULATORY_CARE_PROVIDER_SITE_OTHER): Payer: Commercial Managed Care - PPO

## 2019-11-28 DIAGNOSIS — R0600 Dyspnea, unspecified: Secondary | ICD-10-CM

## 2019-11-28 DIAGNOSIS — R0609 Other forms of dyspnea: Secondary | ICD-10-CM

## 2019-11-28 DIAGNOSIS — R748 Abnormal levels of other serum enzymes: Secondary | ICD-10-CM

## 2019-11-28 LAB — BASIC METABOLIC PANEL
BUN: 10 mg/dL (ref 6–23)
CO2: 27 mEq/L (ref 19–32)
Calcium: 9.1 mg/dL (ref 8.4–10.5)
Chloride: 103 mEq/L (ref 96–112)
Creatinine, Ser: 0.65 mg/dL (ref 0.40–1.20)
GFR: 104.43 mL/min (ref >60.00)
Glucose, Bld: 169 mg/dL — ABNORMAL HIGH (ref 70–99)
Potassium: 4.2 mEq/L (ref 3.5–5.1)
Sodium: 137 mEq/L (ref 135–145)

## 2019-11-28 NOTE — Telephone Encounter (Signed)
Spoke to patient, who is requesting results of US abdomen.  Dr. Erin Fulling, please advise. thanks

## 2019-12-01 LAB — HEPATITIS PANEL, ACUTE
Hep A IgM: NONREACTIVE
Hep B C IgM: NONREACTIVE
Hepatitis B Surface Ag: NONREACTIVE
Hepatitis C Ab: NONREACTIVE
SIGNAL TO CUT-OFF: 0.03 (ref <1.00)

## 2019-12-02 NOTE — Telephone Encounter (Signed)
Please let the patient know that she has evidence of fatty liver disease (hepatic steatosis). If she would like to meet with a GI doctor in regards to further treatment/management we can make a referral for her. Her hepatitis blood work is negative.   Thanks, Freda Jackson, MD [facility] Office: [phone]   See Amion for Pager Details

## 2019-12-03 NOTE — Telephone Encounter (Addendum)
Spoke with pt. She is aware of her results. Pt declined the GI referral at this time. Nothing further was needed.

## 2019-12-09 ENCOUNTER — Other Ambulatory Visit: Payer: Self-pay

## 2019-12-09 ENCOUNTER — Ambulatory Visit: Payer: Commercial Managed Care - PPO | Admitting: Interventional Cardiology

## 2019-12-09 ENCOUNTER — Encounter: Payer: Self-pay | Admitting: Interventional Cardiology

## 2019-12-09 VITALS — BP 130/90 | HR 87 | Ht 66.0 in | Wt 269.6 lb

## 2019-12-09 DIAGNOSIS — R0600 Dyspnea, unspecified: Secondary | ICD-10-CM | POA: Diagnosis not present

## 2019-12-09 DIAGNOSIS — Z8249 Family history of ischemic heart disease and other diseases of the circulatory system: Secondary | ICD-10-CM | POA: Diagnosis not present

## 2019-12-09 DIAGNOSIS — R748 Abnormal levels of other serum enzymes: Secondary | ICD-10-CM | POA: Diagnosis not present

## 2019-12-09 DIAGNOSIS — R0609 Other forms of dyspnea: Secondary | ICD-10-CM

## 2019-12-09 DIAGNOSIS — E119 Type 2 diabetes mellitus without complications: Secondary | ICD-10-CM

## 2019-12-09 LAB — HEPATIC FUNCTION PANEL
ALT: 70 IU/L — ABNORMAL HIGH (ref 0–32)
AST: 72 IU/L — ABNORMAL HIGH (ref 0–40)
Albumin: 3.9 g/dL (ref 3.8–4.8)
Alkaline Phosphatase: 103 IU/L (ref 44–121)
Bilirubin Total: 0.6 mg/dL (ref 0.0–1.2)
Bilirubin, Direct: 0.21 mg/dL (ref 0.00–0.40)
Total Protein: 7.3 g/dL (ref 6.0–8.5)

## 2019-12-09 LAB — LIPID PANEL
Chol/HDL Ratio: 3.5 ratio (ref 0.0–4.4)
Cholesterol, Total: 175 mg/dL (ref 100–199)
HDL: 50 mg/dL (ref >39)
LDL Chol Calc (NIH): 109 mg/dL — ABNORMAL HIGH (ref 0–99)
Triglycerides: 85 mg/dL (ref 0–149)
VLDL Cholesterol Cal: 16 mg/dL (ref 5–40)

## 2019-12-09 NOTE — Progress Notes (Signed)
Cardiology Office Note   Date:  12/09/2019   ID:  Teresa Castaneda, DOB 1970-06-21, MRN [id number]  PCP:  System, Provider Not In    No chief complaint on file.  DOE  [facility] Readings from Last 3 Encounters:  12/09/19 269 lb 9.6 oz (122.3 kg)  11/17/19 270 lb 12.8 oz (122.8 kg)  10/13/19 272 lb 12.8 oz (123.7 kg)       History of Present Illness: Teresa Castaneda is a 49 y.o. female who is being seen today for the evaluation of DOE at the request of Freddi Starr, MD.  She has a h/o asthma since age 49.  She is managed with inhalers.  She got COVID in 8/21 but managed at home.  No antibody treatment or hospital stay.  She continues to have DOE, rare palpitations- lasting seconds, and shortlived sharp chest pains.   Denies : Chest pain. Dizziness. Leg edema. Nitroglycerin use. Orthopnea. Palpitations. Paroxysmal nocturnal dyspnea. Shortness of breath. Syncope.   Walking limited by back pain.  Some walking at work.  No chest pain with walking at work.  Quit smoking since COVID in 8/21.   Heart disease in both parents, mother with stents, DM, COPD- died at 49, father in his early 46s.  Echo 10/21: "Left ventricular ejection fraction, by estimation, is 65 to 70%. The  left ventricle has normal function. The left ventricle has no regional  wall motion abnormalities. There is mild concentric left ventricular  hypertrophy. Left ventricular diastolic  parameters are consistent with Grade I diastolic dysfunction (impaired  relaxation). Elevated left atrial pressure.  2. Right ventricular systolic function is normal. The right ventricular  size is normal. There is normal pulmonary artery systolic pressure. The  estimated right ventricular systolic pressure is 59.9 mmHg.  3. Left atrial size was mildly dilated.  4. The mitral valve is normal in structure. Mild mitral valve  regurgitation. No evidence of mitral stenosis.  5. The aortic valve is normal in structure. Aortic  valve regurgitation is  not visualized. No aortic stenosis is present.  6. The inferior vena cava is normal in size with greater than 50%  respiratory variability, suggesting right atrial pressure of 3 mmHg. "    Past Medical History:  Diagnosis Date  . Allergies   . Arthritis   . Asthma   . Back pain   . DJD (degenerative joint disease), lumbar     Past Surgical History:  Procedure Laterality Date  . ABDOMINAL HYSTERECTOMY       Current Outpatient Medications  Medication Sig Dispense Refill  . albuterol (VENTOLIN HFA) 108 (90 Base) MCG/ACT inhaler Inhale 2 puffs into the lungs every 6 (six) hours as needed. For shortness of breath and wheezing 18 g 0  . ARMOUR THYROID 180 MG tablet Take 180 mg by mouth daily.    Marland Kitchen azelastine (ASTELIN) 0.1 % nasal spray Place 2 sprays into both nostrils 2 (two) times daily. 30 mL 0  . Fluticasone-Umeclidin-Vilant (TRELEGY ELLIPTA) 100-62.5-25 MCG/INH AEPB Inhale 1 puff into the lungs daily. 1 each 6  . furosemide (LASIX) 20 MG tablet Take 0.5 tablets (10 mg total) by mouth daily. 15 tablet 0  . glipiZIDE (GLUCOTROL) 5 MG tablet Take 5 mg by mouth daily before breakfast.    . ibuprofen (ADVIL) 600 MG tablet Take 600 mg by mouth 3 (three) times daily as needed.    Marland Kitchen levocetirizine (XYZAL) 5 MG tablet Take 5 mg by mouth every evening.    Marland Kitchen  LORazepam (ATIVAN) 1 MG tablet Take 1 mg by mouth every 8 (eight) hours as needed for anxiety.    . metFORMIN (GLUCOPHAGE-XR) 500 MG 24 hr tablet Take 500 mg by mouth daily.    . montelukast (SINGULAIR) 10 MG tablet Take 10 mg by mouth at bedtime.    Marland Kitchen omeprazole (PRILOSEC) 40 MG capsule Take 40 mg by mouth daily.     No current facility-administered medications for this visit.    Allergies:   Clarithromycin and Codeine    Social History:  The patient  reports that she quit smoking about 2 months ago. Her smoking use included cigarettes. She started smoking about 34 years ago. She smoked 0.50 packs per day.  She has never used smokeless tobacco. She reports that she does not drink alcohol and does not use drugs.   Family History:  The patient's family history includes COPD in her mother; Coronary artery disease in her father and mother; Diabetes in her mother; Hypertension in her father.    ROS:  Please see the history of present illness.   Otherwise, review of systems are positive for back pain.   All other systems are reviewed and negative.    PHYSICAL EXAM: VS:  BP 130/90   Pulse 87   Ht 5' 6"  (1.676 m)   Wt 269 lb 9.6 oz (122.3 kg)   SpO2 94%   BMI 43.51 kg/m  , BMI Body mass index is 43.51 kg/m. GEN: Well nourished, well developed, in no acute distress  HEENT: normal  Neck: no JVD, carotid bruits, or masses Cardiac: RRR; no murmurs, rubs, or gallops,no edema  Respiratory:  clear to auscultation bilaterally, normal work of breathing GI: soft, nontender, nondistended, + BS MS: no deformity or atrophy  Skin: warm and dry, no rash Neuro:  Strength and sensation are intact Psych: euthymic mood, full affect   EKG:   The ekg ordered 10/03/19 demonstrates NSR, no ST changes   Recent Labs: 07/28/2019: ALT 58 11/07/2019: Hemoglobin 13.5; Platelets 122.0 11/28/2019: BUN 10; Creatinine, Ser 0.65; Potassium 4.2; Sodium 137   Lipid Panel No results found for: CHOL, TRIG, HDL, CHOLHDL, VLDL, LDLCALC, LDLDIRECT   Other studies Reviewed: Additional studies/ records that were reviewed today with results demonstrating: .   ASSESSMENT AND PLAN:  1. DOE: Getting better.  Some days are better than others.  SHe is trying to increase stamina with walking.  If symptoms worsen, would consider coronary CTA. 2. Morbid Obesity: We spoke about whole food, plant-based diet.  Increased fiber intake.  This is an important long-term issue for her. 3. Family h/o early CAD:  Check lipids.  Both parents had CAD.  No known disease in siblings. 4. DM: On metformin.  Weight loss and healthy diet will  help. 5. Elevated LFTs: Recheck LFTs along with her lipids.   Current medicines are reviewed at length with the patient today.  The patient concerns regarding her medicines were addressed.  The following changes have been made:  No change  Labs/ tests ordered today include:  No orders of the defined types were placed in this encounter.   Recommend 150 minutes/week of aerobic exercise Low fat, low carb, high fiber diet recommended  Disposition:   FU in 1 year; sooner if sx worsen or if lipids are severely abnormal   Signed, Larae Grooms, MD  12/09/2019 9:14 AM    [facility] [address], [place], [place]  [address] Phone: [phone]; Fax: [phone]

## 2019-12-09 NOTE — Patient Instructions (Addendum)
Medication Instructions:  Your physician recommends that you continue on your current medications as directed. Please refer to the Current Medication list given to you today.  *If you need a refill on your cardiac medications before your next appointment, please call your pharmacy*   Lab Work: TODAY: LIPIDS, LFTS  If you have labs (blood work) drawn today and your tests are completely normal, you will receive your results only by: Marland Kitchen MyChart Message (if you have MyChart) OR . A paper copy in the mail If you have any lab test that is abnormal or we need to change your treatment, we will call you to review the results.   Testing/Procedures: None   Follow-Up: At [facility], you and your health needs are our priority.  As part of our continuing mission to provide you with exceptional heart care, we have created designated Provider Care Teams.  These Care Teams include your primary Cardiologist (physician) and Advanced Practice Providers (APPs -  Physician Assistants and Nurse Practitioners) who all work together to provide you with the care you need, when you need it.  We recommend signing up for the patient portal called "MyChart".  Sign up information is provided on this After Visit Summary.  MyChart is used to connect with patients for Virtual Visits (Telemedicine).  Patients are able to view lab/test results, encounter notes, upcoming appointments, etc.  Non-urgent messages can be sent to your provider as well.   To learn more about what you can do with MyChart, go to [email or web address].    Your next appointment:   12 month(s)  The format for your next appointment:   In Person  Provider:   You may see Casandra Doffing, MD or one of the following Advanced Practice Providers on your designated Care Team:    Melina Copa, PA-C  Ermalinda Barrios, PA-C    Other Instructions  High-Fiber Diet Fiber, also called dietary fiber, is a type of carbohydrate that is found in fruits,  vegetables, whole grains, and beans. A high-fiber diet can have many health benefits. Your health care provider may recommend a high-fiber diet to help:  Prevent constipation. Fiber can make your bowel movements more regular.  Lower your cholesterol.  Relieve the following conditions: ? Swelling of veins in the anus (hemorrhoids). ? Swelling and irritation (inflammation) of specific areas of the digestive tract (uncomplicated diverticulosis). ? A problem of the large intestine (colon) that sometimes causes pain and diarrhea (irritable bowel syndrome, IBS).  Prevent overeating as part of a weight-loss plan.  Prevent heart disease, type 2 diabetes, and certain cancers. What is my plan? The recommended daily fiber intake in grams (g) includes:  38 g for men age 22 or younger.  30 g for men over age 44.  5 g for women age 8 or younger.  21 g for women over age 18. You can get the recommended daily intake of dietary fiber by:  Eating a variety of fruits, vegetables, grains, and beans.  Taking a fiber supplement, if it is not possible to get enough fiber through your diet. What do I need to know about a high-fiber diet?  It is better to get fiber through food sources rather than from fiber supplements. There is not a lot of research about how effective supplements are.  Always check the fiber content on the nutrition facts label of any prepackaged food. Look for foods that contain 5 g of fiber or more per serving.  Talk with a diet and nutrition specialist (  dietitian) if you have questions about specific foods that are recommended or not recommended for your medical condition, especially if those foods are not listed below.  Gradually increase how much fiber you consume. If you increase your intake of dietary fiber too quickly, you may have bloating, cramping, or gas.  Drink plenty of water. Water helps you to digest fiber. What are tips for following this plan?  Eat a wide  variety of high-fiber foods.  Make sure that half of the grains that you eat each day are whole grains.  Eat breads and cereals that are made with whole-grain flour instead of refined flour or white flour.  Eat brown rice, bulgur wheat, or millet instead of white rice.  Start the day with a breakfast that is high in fiber, such as a cereal that contains 5 g of fiber or more per serving.  Use beans in place of meat in soups, salads, and pasta dishes.  Eat high-fiber snacks, such as berries, raw vegetables, nuts, and popcorn.  Choose whole fruits and vegetables instead of processed forms like juice or sauce. What foods can I eat?  Fruits Berries. Pears. Apples. Oranges. Avocado. Prunes and raisins. Dried figs. Vegetables Sweet potatoes. Spinach. Kale. Artichokes. Cabbage. Broccoli. Cauliflower. Green peas. Carrots. Squash. Grains Whole-grain breads. Multigrain cereal. Oats and oatmeal. Brown rice. Barley. Bulgur wheat. [place]. Quinoa. Bran muffins. Popcorn. Rye wafer crackers. Meats and other proteins Navy, kidney, and pinto beans. Soybeans. Split peas. Lentils. Nuts and seeds. Dairy Fiber-fortified yogurt. Beverages Fiber-fortified soy milk. Fiber-fortified orange juice. Other foods Fiber bars. The items listed above may not be a complete list of recommended foods and beverages. Contact a dietitian for more options. What foods are not recommended? Fruits Fruit juice. Cooked, strained fruit. Vegetables Fried potatoes. Canned vegetables. Well-cooked vegetables. Grains White bread. Pasta made with refined flour. White rice. Meats and other proteins Fatty cuts of meat. Fried chicken or fried fish. Dairy Milk. Yogurt. Cream cheese. Sour cream. Fats and oils Butters. Beverages Soft drinks. Other foods Cakes and pastries. The items listed above may not be a complete list of foods and beverages to avoid. Contact a dietitian for more information. Summary  Fiber is a type of  carbohydrate. It is found in fruits, vegetables, whole grains, and beans.  There are many health benefits of eating a high-fiber diet, such as preventing constipation, lowering blood cholesterol, helping with weight loss, and reducing your risk of heart disease, diabetes, and certain cancers.  Gradually increase your intake of fiber. Increasing too fast can result in cramping, bloating, and gas. Drink plenty of water while you increase your fiber.  The best sources of fiber include whole fruits and vegetables, whole grains, nuts, seeds, and beans. This information is not intended to replace advice given to you by your health care provider. Make sure you discuss any questions you have with your health care provider. Document Revised: 11/06/2016 Document Reviewed: 11/06/2016 Elsevier Patient Education  2020 [employer].

## 2019-12-10 ENCOUNTER — Telehealth: Payer: Self-pay

## 2019-12-10 DIAGNOSIS — E785 Hyperlipidemia, unspecified: Secondary | ICD-10-CM

## 2019-12-10 NOTE — Telephone Encounter (Signed)
-----   Message from Nuala Alpha, LPN sent at [phone]  8:15 AM EST -----  ----- Message ----- From: Jettie Booze, MD Sent: 12/10/2019   8:11 AM EST To: Cleon Gustin, RN, [facility] Triage  LFTs increased.  Would like to consider statin because of coronary calcium, family h/o CAD, once liver w/u is finished.  LDL target < 100. Please forward to PCP.

## 2019-12-10 NOTE — Telephone Encounter (Signed)
Patient made aware of lab results and recommendations per Dr. Irish Lack. Patient states she is interested in starting a statin regimen as long as it is cost effectived. Patient states that she will not start seeing her new PCP until the end of 1/22 and doesn't remember their name. Will forward this message to Dr. Irish Lack to further advise on statin therapy and will follow up with patient accordingly there after.

## 2019-12-10 NOTE — Telephone Encounter (Signed)
OK to start atorvastatin 10 mg daily.  Recheck liver and lipids in 6 weeks.    JV

## 2019-12-15 NOTE — Telephone Encounter (Signed)
Left message for patient to call back  

## 2019-12-17 MED ORDER — ATORVASTATIN CALCIUM 10 MG PO TABS: 10.0000 mg | ORAL_TABLET | Freq: Every day | ORAL | 3 refills | Status: DC

## 2019-12-17 NOTE — Telephone Encounter (Signed)
Pearly is returning Teresa Castaneda's call. Please advise.

## 2019-12-17 NOTE — Telephone Encounter (Signed)
Patient made aware of recommendations to start atorvastatin 10 mg QD. Rx sent to preferred pharmacy. She is scheduled for fasting labs on 01/28/20.

## 2019-12-26 ENCOUNTER — Other Ambulatory Visit: Payer: Self-pay

## 2019-12-26 ENCOUNTER — Encounter (HOSPITAL_COMMUNITY): Payer: Self-pay | Admitting: Emergency Medicine

## 2019-12-26 ENCOUNTER — Ambulatory Visit (HOSPITAL_COMMUNITY)
Admission: EM | Admit: 2019-12-26 | Discharge: 2019-12-26 | Disposition: A | Discharge status: Home / Self Care | Payer: Commercial Managed Care - PPO | Attending: Internal Medicine | Admitting: Internal Medicine

## 2019-12-26 DIAGNOSIS — G5602 Carpal tunnel syndrome, left upper limb: Secondary | ICD-10-CM | POA: Diagnosis not present

## 2019-12-26 MED ORDER — IBUPROFEN 600 MG PO TABS: 600.0000 mg | ORAL_TABLET | Freq: Four times a day (QID) | ORAL | 0 refills | Status: DC | PRN

## 2019-12-26 MED ORDER — METHYLPREDNISOLONE SODIUM SUCC 125 MG IJ SOLR
INTRAMUSCULAR | Status: AC
  Filled 2019-12-26: qty 2

## 2019-12-26 MED ORDER — METHYLPREDNISOLONE SODIUM SUCC 125 MG IJ SOLR
60.0000 mg | Freq: Once | INTRAMUSCULAR | Status: AC
  Administered 2019-12-26: 60 mg via INTRAMUSCULAR

## 2019-12-26 NOTE — ED Provider Notes (Signed)
[name]    CSN: [id number] Arrival date & time: 12/26/19  1510      History   Chief Complaint Chief Complaint  Patient presents with  . Numbness    HPI Teresa Castaneda is a 49 y.o. female comes to the urgent care with left hand numbness which started this morning.  Patient describes numbness in the left thumb index and middle fingers.  No weakness in the left arm or hand.  No trauma to the left hand.  Patient's job involves repetitive hand movements but she mainly uses the right hand.  She also complains of right wrist pain without any swelling.  No redness, fever or chills.Marland Kitchen   HPI  Past Medical History:  Diagnosis Date  . Allergies   . Arthritis   . Asthma   . Back pain    Spinal Issue   . DJD (degenerative joint disease), lumbar     Patient Active Problem List   Diagnosis Date Noted  . History of COVID-19 09/23/2019  . Pneumonia due to COVID-19 virus 09/23/2019  . Cough 09/23/2019  . BMI 40.0-44.9, adult ([place]) 07/22/2019  . Facet arthropathy, lumbar 07/22/2019  . Sacroiliitis ([place]) 07/22/2019  . ANXIETY 09/10/2006  . DEPRESSION 09/10/2006  . ASTHMA 09/10/2006  . HEADACHE 09/10/2006    Past Surgical History:  Procedure Laterality Date  . ABDOMINAL HYSTERECTOMY      OB History   No obstetric history on file.      Home Medications    Prior to Admission medications   Medication Sig Start Date End Date Taking? Authorizing Provider  ARMOUR THYROID 180 MG tablet Take 180 mg by mouth daily. 09/07/19  Yes [provider]  atorvastatin (LIPITOR) 10 MG tablet Take 1 tablet (10 mg total) by mouth daily. 12/17/19  Yes Jettie Booze, MD  Fluticasone-Umeclidin-Vilant (TRELEGY ELLIPTA) 100-62.5-25 MCG/INH AEPB Inhale 1 puff into the lungs daily. 10/13/19  Yes Freddi Starr, MD  furosemide (LASIX) 20 MG tablet Take 0.5 tablets (10 mg total) by mouth daily. 11/17/19  Yes Freddi Starr, MD  levocetirizine (XYZAL) 5 MG tablet Take 5  mg by mouth every evening.   Yes [provider]  metFORMIN (GLUCOPHAGE-XR) 500 MG 24 hr tablet Take 500 mg by mouth daily. 11/28/19  Yes [provider]  montelukast (SINGULAIR) 10 MG tablet Take 10 mg by mouth at bedtime.   Yes [provider]  omeprazole (PRILOSEC) 40 MG capsule Take 40 mg by mouth daily. 09/25/19  Yes [provider]  albuterol (VENTOLIN HFA) 108 (90 Base) MCG/ACT inhaler Inhale 2 puffs into the lungs every 6 (six) hours as needed. For shortness of breath and wheezing 09/01/19   Ok Edwards, PA-C  ibuprofen (ADVIL) 600 MG tablet Take 1 tablet (600 mg total) by mouth every 6 (six) hours as needed. 12/26/19   Ninel Abdella, Myrene Galas, MD  LORazepam (ATIVAN) 1 MG tablet Take 1 mg by mouth every 8 (eight) hours as needed for anxiety.    [provider]  azelastine (ASTELIN) 0.1 % nasal spray Place 2 sprays into both nostrils 2 (two) times daily. 04/23/19 12/26/19  Ok Edwards, PA-C    Family History Family History  Problem Relation Age of Onset  . Coronary artery disease Mother   . Diabetes Mother   . COPD Mother   . Coronary artery disease Father   . Hypertension Father     Social History Social History   Tobacco Use  .  Smoking status: Former Smoker    Packs/day: 0.50    Types: Cigarettes    Start date: 01/16/1985    Quit date: 10/08/2019    Years since quitting: 0.2  . Smokeless tobacco: Never Used  Substance Use Topics  . Alcohol use: No  . Drug use: No     Allergies   Clarithromycin and Codeine   Review of Systems Review of Systems  HENT: Negative.   Gastrointestinal: Negative.   Genitourinary: Negative.   Musculoskeletal: Positive for arthralgias. Negative for myalgias.  Skin: Negative.      Physical Exam Triage Vital Signs ED Triage Vitals  Enc Vitals Group     BP 12/26/19 1610 133/78     Pulse Rate 12/26/19 1610 81     Resp 12/26/19 1610 16     Temp 12/26/19 1610 97.9 F (36.6 C)     Temp Source 12/26/19  1610 Oral     SpO2 12/26/19 1610 96 %     Weight 12/26/19 1606 270 lb (122.5 kg)     Height 12/26/19 1606 5' 6"  (1.676 m)     Head Circumference --      Peak Flow --      Pain Score 12/26/19 1606 0     Pain Loc --      Pain Edu? --      Excl. in [place]? --    No data found.  Updated Vital Signs BP 133/78 (BP Location: Right Arm)   Pulse 81   Temp 97.9 F (36.6 C) (Oral)   Resp 16   Ht 5' 6"  (1.676 m)   Wt 122.5 kg   SpO2 96%   BMI 43.58 kg/m   Visual Acuity Right Eye Distance:   Left Eye Distance:   Bilateral Distance:    Right Eye Near:   Left Eye Near:    Bilateral Near:     Physical Exam Vitals and nursing note reviewed.  Constitutional:      General: She is not in acute distress.    Appearance: She is not ill-appearing.  Musculoskeletal:     Comments: Phalen's test positive.  Tinel's test positive.  Decreased sensation in the left hand-palmar aspect of the left hand involving the arm, index and middle fingers.  Skin:    General: Skin is warm.     Findings: No bruising, erythema or lesion.  Neurological:     Mental Status: She is alert.      UC Treatments / Results  Labs (all labs ordered are listed, but only abnormal results are displayed) Labs Reviewed - No data to display  EKG   Radiology No results found.  Procedures Procedures (including critical care time)  Medications Ordered in UC Medications  methylPREDNISolone sodium succinate (SOLU-MEDROL) 125 mg/2 mL injection 60 mg (60 mg Intramuscular Given 12/26/19 1739)    Initial Impression / Assessment and Plan / UC Course  I have reviewed the triage vital signs and the nursing notes.  Pertinent labs & imaging results that were available during my care of the patient were reviewed by me and considered in my medical decision making (see chart for details).     1.  Carpal tunnel syndrome of the left wrist: Ibuprofen 600 mg every 6 hours as needed for pain Left wrist splint Please follow-up  with your sports medicine physician for further management Solu-Medrol 60 mg IM x1 dose Return precautions given. Final Clinical Impressions(s) / UC Diagnoses   Final diagnoses:  Carpal tunnel syndrome of  left wrist     Discharge Instructions     Use medications as directed Wear the wrist brace at night Gentle range of motion exercises Follow-up with your sports medicine physician for further management of carpal tunnel syndrome.   ED Prescriptions    Medication Sig Dispense Auth. Provider   ibuprofen (ADVIL) 600 MG tablet Take 1 tablet (600 mg total) by mouth every 6 (six) hours as needed. 30 tablet Keneth Borg, Myrene Galas, MD     PDMP not reviewed this encounter.   Chase Picket, MD 12/26/19 [phone]

## 2019-12-26 NOTE — ED Triage Notes (Addendum)
Patient c/o LFT hand numbness since this morning.   Patient has normal ROM .   Patient denies SOB, Chest pain, and Headache.  Patient denies any trauma or falls.    Patient has taken Ibuprofen w/ no relief of numbness.   History of Carpal Tunnel.

## 2019-12-26 NOTE — Discharge Instructions (Addendum)
Use medications as directed Wear the wrist brace at night Gentle range of motion exercises Follow-up with your sports medicine physician for further management of carpal tunnel syndrome.

## 2020-01-28 ENCOUNTER — Other Ambulatory Visit: Payer: Commercial Managed Care - PPO

## 2020-01-28 ENCOUNTER — Other Ambulatory Visit: Payer: Self-pay

## 2020-01-28 DIAGNOSIS — E785 Hyperlipidemia, unspecified: Secondary | ICD-10-CM

## 2020-01-28 LAB — HEPATIC FUNCTION PANEL
ALT: 82 IU/L — ABNORMAL HIGH (ref 0–32)
AST: 72 IU/L — ABNORMAL HIGH (ref 0–40)
Albumin: 3.9 g/dL (ref 3.8–4.8)
Alkaline Phosphatase: 100 IU/L (ref 44–121)
Bilirubin Total: 0.6 mg/dL (ref 0.0–1.2)
Bilirubin, Direct: 0.21 mg/dL (ref 0.00–0.40)
Total Protein: 7.2 g/dL (ref 6.0–8.5)

## 2020-01-28 LAB — LIPID PANEL
Chol/HDL Ratio: 3.7 ratio (ref 0.0–4.4)
Cholesterol, Total: 158 mg/dL (ref 100–199)
HDL: 43 mg/dL (ref >39)
LDL Chol Calc (NIH): 97 mg/dL (ref 0–99)
Triglycerides: 100 mg/dL (ref 0–149)
VLDL Cholesterol Cal: 18 mg/dL (ref 5–40)

## 2020-02-10 ENCOUNTER — Other Ambulatory Visit: Payer: Self-pay

## 2020-02-10 ENCOUNTER — Ambulatory Visit (INDEPENDENT_AMBULATORY_CARE_PROVIDER_SITE_OTHER): Payer: Commercial Managed Care - PPO | Admitting: Pulmonary Disease

## 2020-02-10 ENCOUNTER — Encounter: Payer: Self-pay | Admitting: Pulmonary Disease

## 2020-02-10 VITALS — BP 124/72 | HR 79 | Temp 98.1°F | Ht 66.0 in | Wt 271.4 lb

## 2020-02-10 DIAGNOSIS — Z6841 Body Mass Index (BMI) 40.0 and over, adult: Secondary | ICD-10-CM | POA: Diagnosis not present

## 2020-02-10 DIAGNOSIS — G4734 Idiopathic sleep related nonobstructive alveolar hypoventilation: Secondary | ICD-10-CM

## 2020-02-10 DIAGNOSIS — J4540 Moderate persistent asthma, uncomplicated: Secondary | ICD-10-CM | POA: Diagnosis not present

## 2020-02-10 NOTE — Patient Instructions (Signed)
Use saline nasal spray or Ayr saline gel to place in nostrils at night  Continue trellegy 1 puff daily and albuterol as needed  Please talk with you primary care about referrals to gastroenterology for concern of fatty liver disease and a referral to hematology for thrombocytopenia (low platelets)

## 2020-02-10 NOTE — Progress Notes (Signed)
Synopsis: Referred in September 2021 for shortness of breath, followed for moderate persistent asthma.   Subjective:   PATIENT ID: Teresa Castaneda GENDER: female DOB: [birth year], MRN: [id number]   HPI  Chief Complaint  Patient presents with  . Follow-up    4 mo f/u for asthma, states she has been doing well since last visit.     Teresa Castaneda is a 50 year old woman, former smoker with asthma, history Covid 19 pneumonia, GERD and nocturnal hypoxemia who returns to pulmonary clinic for follow up.  She reports feeling significantly better since starting Trelegy Ellipta daily. She has used her albuterol inhaler 2 times in the past 2 weeks. She has started using 2L of oxygen at night and has also noticed improvement in her shortness of breath. She still experiences dyspnea with exertion but overall improved since last visit. She saw Dr. Irish Lack of Cardiology on 12/09/19 for her exertional dyspnea and family history of early CAD. She was started on atorvastatin 5m daily for hyperlipidemia. She is taking lasix as needed for shortness of breath and lower extremity edema.  OV 10/2019 Recent labs show an IgE level of 621 and absolute eosinophil count of 400. CBC also indicates low platelets of 122k and 129k over the past two lab draws in October and July. She also had elevated LFTs in July, AST 41/ALT 58.     Pulmonary function tests show normal spirometry, lung volumes and DLCO and no significant change to bronchodilators.    She had an echo done on 10/20/19 which showed normal EF 697-02% grade I diastolic dysfunction. Normal RV systolic function and RVSP of 27.4 mmHg. Mildly dilated left atrial size.   She had a home sleep study done recently which showed an AHI of 2.9 and 50.5 minutes out of 370 minutes she had oxygen desaturations below 89%.     Past Medical History:  Diagnosis Date  . Allergies   . Arthritis   . Asthma   . Back pain    Spinal Issue   . DJD (degenerative joint  disease), lumbar      Family History  Problem Relation Age of Onset  . Coronary artery disease Mother   . Diabetes Mother   . COPD Mother   . Coronary artery disease Father   . Hypertension Father      Social History   Socioeconomic History  . Marital status: Single    Spouse name: Not on file  . Number of children: Not on file  . Years of education: Not on file  . Highest education level: Not on file  Occupational History  . Not on file  Tobacco Use  . Smoking status: Former Smoker    Packs/day: 0.50    Types: Cigarettes    Start date: 01/16/1985    Quit date: 10/08/2019    Years since quitting: 0.3  . Smokeless tobacco: Never Used  Substance and Sexual Activity  . Alcohol use: No  . Drug use: No  . Sexual activity: Not on file  Other Topics Concern  . Not on file  Social History Narrative  . Not on file   Social Determinants of Health   Financial Resource Strain: Not on file  Food Insecurity: Not on file  Transportation Needs: Not on file  Physical Activity: Not on file  Stress: Not on file  Social Connections: Not on file  Intimate Partner Violence: Not on file     Allergies  Allergen Reactions  . Clarithromycin Other (  See Comments)    Body cramps   . Codeine Nausea And Vomiting     Outpatient Medications Prior to Visit  Medication Sig Dispense Refill  . albuterol (VENTOLIN HFA) 108 (90 Base) MCG/ACT inhaler Inhale 2 puffs into the lungs every 6 (six) hours as needed. For shortness of breath and wheezing 18 g 0  . ARMOUR THYROID 180 MG tablet Take 180 mg by mouth daily.    Marland Kitchen atorvastatin (LIPITOR) 10 MG tablet Take 1 tablet (10 mg total) by mouth daily. 90 tablet 3  . Fluticasone-Umeclidin-Vilant (TRELEGY ELLIPTA) 100-62.5-25 MCG/INH AEPB Inhale 1 puff into the lungs daily. 1 each 6  . furosemide (LASIX) 20 MG tablet Take 0.5 tablets (10 mg total) by mouth daily. 15 tablet 0  . ibuprofen (ADVIL) 600 MG tablet Take 1 tablet (600 mg total) by mouth every  6 (six) hours as needed. 30 tablet 0  . levocetirizine (XYZAL) 5 MG tablet Take 5 mg by mouth every evening.    Marland Kitchen LORazepam (ATIVAN) 1 MG tablet Take 1 mg by mouth every 8 (eight) hours as needed for anxiety.    . montelukast (SINGULAIR) 10 MG tablet Take 10 mg by mouth at bedtime.    Marland Kitchen omeprazole (PRILOSEC) 40 MG capsule Take 40 mg by mouth daily.    . metFORMIN (GLUCOPHAGE-XR) 500 MG 24 hr tablet Take 500 mg by mouth daily.     No facility-administered medications prior to visit.    Review of Systems  Constitutional: Positive for malaise/fatigue. Negative for chills, diaphoresis, fever and weight loss.  HENT: Negative for congestion, ear pain, nosebleeds, sinus pain and sore throat.   Eyes: Negative for blurred vision.  Respiratory: Positive for shortness of breath. Negative for cough, hemoptysis, sputum production and wheezing.   Cardiovascular: Negative for chest pain, palpitations, orthopnea, claudication, leg swelling and PND.  Gastrointestinal: Positive for heartburn. Negative for abdominal pain, blood in stool, nausea and vomiting.  Genitourinary: Negative for hematuria.  Musculoskeletal: Negative for joint pain and myalgias.  Skin: Negative for itching and rash.  Neurological: Negative for dizziness, weakness and headaches.  Endo/Heme/Allergies: Does not bruise/bleed easily.  Psychiatric/Behavioral: Negative.    Objective:   Vitals:   02/10/20 0857  BP: 124/72  Pulse: 79  Temp: 98.1 F (36.7 C)  TempSrc: Temporal  SpO2: 97%  Weight: 271 lb 6.4 oz (123.1 kg)  Height: 5' 6"  (1.676 m)     Physical Exam Constitutional:      General: She is not in acute distress.    Appearance: She is obese.  HENT:     Head: Normocephalic and atraumatic.     Nose: Nose normal.     Mouth/Throat:     Mouth: Mucous membranes are moist.     Pharynx: Oropharynx is clear.  Eyes:     General: No scleral icterus.    Extraocular Movements: Extraocular movements intact.      Conjunctiva/sclera: Conjunctivae normal.     Pupils: Pupils are equal, round, and reactive to light.  Cardiovascular:     Rate and Rhythm: Normal rate and regular rhythm.     Pulses: Normal pulses.     Heart sounds: Normal heart sounds. No murmur heard.   Pulmonary:     Effort: Pulmonary effort is normal.     Breath sounds: Decreased air movement present. No wheezing, rhonchi or rales.  Musculoskeletal:     Right lower leg: No edema.     Left lower leg: No edema.  Skin:  General: Skin is warm and dry.     Findings: No rash.  Neurological:     General: No focal deficit present.     Mental Status: She is alert and oriented to person, place, and time. Mental status is at baseline.  Psychiatric:        Mood and Affect: Mood normal.        Behavior: Behavior normal.        Thought Content: Thought content normal.        Judgment: Judgment normal.     CBC    Component Value Date/Time   WBC 6.3 02/12/2020 1008   WBC 7.4 11/07/2019 1515   RBC 5.04 02/12/2020 1008   RBC 4.67 11/07/2019 1515   HGB 14.4 02/12/2020 1008   HCT 43.8 02/12/2020 1008   PLT 114 (L) 02/12/2020 1008   MCV 87 02/12/2020 1008   MCH 28.6 02/12/2020 1008   MCHC 32.9 02/12/2020 1008   MCHC 33.6 11/07/2019 1515   RDW 12.2 02/12/2020 1008   LYMPHSABS 3.1 11/07/2019 1515   LYMPHSABS 3.5 (H) 07/28/2019 1459   MONOABS 0.3 11/07/2019 1515   EOSABS 0.4 11/07/2019 1515   EOSABS 0.4 07/28/2019 1459   BASOSABS 0.1 11/07/2019 1515   BASOSABS 0.0 07/28/2019 1459   BMP Latest Ref Rng & Units 11/28/2019 07/28/2019 01/13/2007  Glucose 70 - 99 mg/dL 169(H) 104(H) 92  BUN 6 - 23 mg/dL 10 11 9   Creatinine 0.40 - 1.20 mg/dL 0.65 0.75 0.6  BUN/Creat Ratio 9 - 23 - 15 -  Sodium 135 - 145 mEq/L 137 141 137  Potassium 3.5 - 5.1 mEq/L 4.2 4.1 3.9  Chloride 96 - 112 mEq/L 103 104 108  CO2 19 - 32 mEq/L 27 24 -  Calcium 8.4 - 10.5 mg/dL 9.1 9.1 -    Chest imaging:  CXR 09/12/19  Heart size is normal. Mediastinal  shadows are normal. Mild patchy infiltrates and or volume loss in the mid and lower lungs. No dense consolidation or lobar collapse. No effusions. No significant bone finding.  09/23/19 The heart size and mediastinal contours are unchanged. Persistent trace bi basilar airspace opacities. No new focal consolidation. The visualized skeletal structures are unremarkable.  10/03/19: Hazy bibasilar airspace disease. No other focal parenchymal opacity. No pleural effusion or pneumothorax. Stable cardiomediastinal silhouette. No aggressive osseous lesion.    PFT: Post FEV1/FVC: 85 FEV1: 2.36L (77%) FVC: 2.79L (72%) TLC: 4.79L (89%) DLCO 121%  Labs: Reviewed as above  ECHO 10/20/19 1. Left ventricular ejection fraction, by estimation, is 65 to 70%. The  left ventricle has normal function. The left ventricle has no regional  wall motion abnormalities. There is mild concentric left ventricular  hypertrophy. Left ventricular diastolic  parameters are consistent with Grade I diastolic dysfunction (impaired  relaxation). Elevated left atrial pressure.  2. Right ventricular systolic function is normal. The right ventricular  size is normal. There is normal pulmonary artery systolic pressure. The  estimated right ventricular systolic pressure is 36.1 mmHg.  3. Left atrial size was mildly dilated.  4. The mitral valve is normal in structure. Mild mitral valve  regurgitation. No evidence of mitral stenosis.  5. The aortic valve is normal in structure. Aortic valve regurgitation is  not visualized. No aortic stenosis is present.  6. The inferior vena cava is normal in size with greater than 50%  respiratory variability, suggesting right atrial pressure of 3 mmHg.   Abdominal US 11/21/19 1.  Cholelithiasis without evidence of cholecystitis.  2. Diffusely increased liver parenchymal echogenicity which is most commonly seen with hepatic steatosis. There is an ill-defined hypoechoic area  adjacent to the gallbladder fossa which is indeterminate but favored to represent focal fatty sparing.  Assessment & Plan:   Moderate persistent asthma without complication  Class 3 severe obesity with serious comorbidity and body mass index (BMI) of 40.0 to 44.9 in adult, unspecified obesity type (HCC)  Nocturnal hypoxemia  Discussion: Teresa Castaneda is a 50 year old woman, former smoker with asthma, history Covid 19 pneumonia, GERD and nocturnal hypoxemia who returns to pulmonary clinic for follow up.   She is doing well on trelegy ellipta for her asthma and is to continue 2L of oxygen at night as she has noticed benefit since starting supplemental oxygen at night.    I discussed with her that she needs to talk with her primary care about referrals to Gastroenterology for fatty liver disease and a referral to hematology for evaluation for thrombocytopenia.    She is to follow up in 6 months.  Freda Jackson, MD [facility] Office: [phone]   Current Outpatient Medications:  .  albuterol (VENTOLIN HFA) 108 (90 Base) MCG/ACT inhaler, Inhale 2 puffs into the lungs every 6 (six) hours as needed. For shortness of breath and wheezing, Disp: 18 g, Rfl: 0 .  ARMOUR THYROID 180 MG tablet, Take 180 mg by mouth daily., Disp: , Rfl:  .  atorvastatin (LIPITOR) 10 MG tablet, Take 1 tablet (10 mg total) by mouth daily., Disp: 90 tablet, Rfl: 3 .  Fluticasone-Umeclidin-Vilant (TRELEGY ELLIPTA) 100-62.5-25 MCG/INH AEPB, Inhale 1 puff into the lungs daily., Disp: 1 each, Rfl: 6 .  furosemide (LASIX) 20 MG tablet, Take 0.5 tablets (10 mg total) by mouth daily., Disp: 15 tablet, Rfl: 0 .  ibuprofen (ADVIL) 600 MG tablet, Take 1 tablet (600 mg total) by mouth every 6 (six) hours as needed., Disp: 30 tablet, Rfl: 0 .  levocetirizine (XYZAL) 5 MG tablet, Take 5 mg by mouth every evening., Disp: , Rfl:  .  LORazepam (ATIVAN) 1 MG tablet, Take 1 mg by mouth every 8 (eight) hours  as needed for anxiety., Disp: , Rfl:  .  montelukast (SINGULAIR) 10 MG tablet, Take 10 mg by mouth at bedtime., Disp: , Rfl:  .  omeprazole (PRILOSEC) 40 MG capsule, Take 40 mg by mouth daily., Disp: , Rfl:  .  escitalopram (LEXAPRO) 10 MG tablet, Take 1 tablet (10 mg total) by mouth daily., Disp: 30 tablet, Rfl: 2 .  metFORMIN (GLUCOPHAGE) 500 MG tablet, Take 1 tablet (500 mg total) by mouth 2 (two) times daily with a meal., Disp: 180 tablet, Rfl: 1 .  thyroid (ARMOUR THYROID) 15 MG tablet, Take 1 tablet (15 mg total) by mouth daily. For total dose 116m qd., Disp: 30 tablet, Rfl: 2

## 2020-02-12 ENCOUNTER — Ambulatory Visit (INDEPENDENT_AMBULATORY_CARE_PROVIDER_SITE_OTHER): Payer: Commercial Managed Care - PPO | Admitting: Family Medicine

## 2020-02-12 ENCOUNTER — Ambulatory Visit: Payer: Commercial Managed Care - PPO | Admitting: Sports Medicine

## 2020-02-12 ENCOUNTER — Other Ambulatory Visit: Payer: Self-pay

## 2020-02-12 ENCOUNTER — Encounter: Payer: Self-pay | Admitting: Family Medicine

## 2020-02-12 VITALS — BP 100/67 | HR 93 | Temp 97.3°F | Ht 66.0 in | Wt 272.0 lb

## 2020-02-12 VITALS — BP 139/70 | Ht 66.0 in | Wt 270.0 lb

## 2020-02-12 DIAGNOSIS — R7989 Other specified abnormal findings of blood chemistry: Secondary | ICD-10-CM | POA: Diagnosis not present

## 2020-02-12 DIAGNOSIS — M79646 Pain in unspecified finger(s): Secondary | ICD-10-CM | POA: Diagnosis not present

## 2020-02-12 DIAGNOSIS — Z23 Encounter for immunization: Secondary | ICD-10-CM

## 2020-02-12 DIAGNOSIS — M461 Sacroiliitis, not elsewhere classified: Secondary | ICD-10-CM | POA: Diagnosis not present

## 2020-02-12 DIAGNOSIS — D696 Thrombocytopenia, unspecified: Secondary | ICD-10-CM

## 2020-02-12 DIAGNOSIS — K760 Fatty (change of) liver, not elsewhere classified: Secondary | ICD-10-CM

## 2020-02-12 DIAGNOSIS — E039 Hypothyroidism, unspecified: Secondary | ICD-10-CM | POA: Diagnosis not present

## 2020-02-12 DIAGNOSIS — E1165 Type 2 diabetes mellitus with hyperglycemia: Secondary | ICD-10-CM | POA: Diagnosis not present

## 2020-02-12 DIAGNOSIS — F411 Generalized anxiety disorder: Secondary | ICD-10-CM

## 2020-02-12 MED ORDER — ESCITALOPRAM OXALATE 10 MG PO TABS: 10.0000 mg | ORAL_TABLET | Freq: Every day | ORAL | 2 refills | Status: DC

## 2020-02-12 MED ORDER — METFORMIN HCL 500 MG PO TABS: 500.0000 mg | ORAL_TABLET | Freq: Two times a day (BID) | ORAL | 1 refills | Status: DC

## 2020-02-12 NOTE — Progress Notes (Signed)
Subjective:  Patient ID: Teresa Castaneda, female    DOB: [birth year]  Age: 50 y.o. MRN: [id number]  CC:  Chief Complaint  Patient presents with  . Establish Care    Pt reports she feels ok. Pt reports having issues with increased anxiety. Pt has brought her medical records from previous provider for the doctor to look at.    HPI Teresa Castaneda presents for   New patient establish care with concerns of anxiety today. 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   Anxiety/depression: Symptoms for years. No daily med, tried zoloft, prozac - did not feel like they helped, even with higher dose. wellbutrin made her smoke more.  Ativan one per day after husband's heart attack few weeks ago, doing better now.  but usually for flairs - BID for 2-3 days, then off for few weeks.  Has not met with psychiatry or counseling. Still has some ativan left.   Depression screen [facility] 2/9 02/12/2020  Decreased Interest 0  Down, Depressed, Hopeless 1  PHQ - 2 Score 1   GAD 7 : Generalized Anxiety Score 02/12/2020  Nervous, Anxious, on Edge 3  Control/stop worrying 3  Worry too much - different things 3  Trouble relaxing 2  Restless 2  Easily annoyed or irritable 3  Afraid - awful might happen 1  Total GAD 7 Score 17    Hyperlipidemia: Lipitor 10 mg daily, recent lipids January 12. Lab Results  Component Value Date   CHOL 158 01/28/2020   HDL 43 01/28/2020   LDLCALC 97 01/28/2020   TRIG 100 01/28/2020   CHOLHDL 3.7 01/28/2020   Lab Results  Component Value Date   ALT 82 (H) 01/28/2020   AST 72 (H) 01/28/2020   ALKPHOS 100 01/28/2020   BILITOT 0.6 01/28/2020    Hypothyroidism: Lab Results  Component Value Date   TSH 29.700 (H) 02/12/2020   Takes Armour Thyroid 180 mg daily.  Levothyroxine tried in past - "it doesn't work". Not sure of details.   Asthma Followed by pulmonary, Dr. Erin Fulling, asthma, nocturnal hypoxemia Takes Trelegy Ellipta, albuterol as  needed.  Singular 10 mg daily. Also on Xyzal 5 mg daily previously, Prilosec 40 mg daily. Cardiologist Dr. Irish Lack, with family history of early CAD.  Appointment November 23.  Dyspnea with exertion was improving.  Coronary CTA discussed if symptoms worsen. Denies worsening of dyspnea. Improved asthma control has helped.  2L O2nc at night.   Increased LFTs: Abdominal ultrasound 11/23/2019.  Cholelithiasis without evidence of cholecystitis.  Diffuse parenchymal echogenicity commonly seen with hepatic steatosis.  Hypoechoic area adjacent to gallbladder fossa, possible focal fatty sparing. No new n/v/abd pain.  Lab Results  Component Value Date   ALT 82 (H) 01/28/2020   AST 72 (H) 01/28/2020   ALKPHOS 100 01/28/2020   BILITOT 0.6 01/28/2020   Diabetes: Metformin XR 500 mg daily.  Has cut back on sugars, artificial sweetener.  Home readings in 200's.  Lowest 150-185.  She is on statin. . Lab Results  Component Value Date   HGBA1C 9.8 (H) 02/12/2020   Lab Results  Component Value Date   LDLCALC 97 01/28/2020   CREATININE 0.65 11/28/2019   Thrombocytopenia Takes aspirin - 18m per day.  Ibuprofen for back pain - 6063mevery 2 days.  No bleeding gums, or blood in urine Small amt of blood in nasal d/c every few days, nosebleeds every 2-3 months.  Lab Results  Component Value  Date   WBC 6.3 02/12/2020   HGB 14.4 02/12/2020   HCT 43.8 02/12/2020   MCV 87 02/12/2020   PLT 114 (L) 02/12/2020  plt 129 in 07/2019     History Patient Active Problem List   Diagnosis Date Noted  . History of COVID-19 09/23/2019  . Pneumonia due to COVID-19 virus 09/23/2019  . Cough 09/23/2019  . BMI 40.0-44.9, adult ([place]) 07/22/2019  . Facet arthropathy, lumbar 07/22/2019  . Sacroiliitis ([place]) 07/22/2019  . ANXIETY 09/10/2006  . DEPRESSION 09/10/2006  . ASTHMA 09/10/2006  . HEADACHE 09/10/2006   Past Medical History:  Diagnosis Date  . Allergies   . Arthritis   . Asthma   . Back pain     Spinal Issue   . DJD (degenerative joint disease), lumbar    Past Surgical History:  Procedure Laterality Date  . ABDOMINAL HYSTERECTOMY     Allergies  Allergen Reactions  . Clarithromycin Other (See Comments)    Body cramps   . Codeine Nausea And Vomiting   Prior to Admission medications   Medication Sig Start Date End Date Taking? Authorizing Provider  albuterol (VENTOLIN HFA) 108 (90 Base) MCG/ACT inhaler Inhale 2 puffs into the lungs every 6 (six) hours as needed. For shortness of breath and wheezing 09/01/19  Yes Ok Edwards, PA-C  ARMOUR THYROID 180 MG tablet Take 180 mg by mouth daily. 09/07/19  Yes [provider]  atorvastatin (LIPITOR) 10 MG tablet Take 1 tablet (10 mg total) by mouth daily. 12/17/19  Yes Jettie Booze, MD  Fluticasone-Umeclidin-Vilant (TRELEGY ELLIPTA) 100-62.5-25 MCG/INH AEPB Inhale 1 puff into the lungs daily. 10/13/19  Yes Freddi Starr, MD  furosemide (LASIX) 20 MG tablet Take 0.5 tablets (10 mg total) by mouth daily. 11/17/19  Yes Freddi Starr, MD  ibuprofen (ADVIL) 600 MG tablet Take 1 tablet (600 mg total) by mouth every 6 (six) hours as needed. 12/26/19  Yes Lamptey, Myrene Galas, MD  levocetirizine (XYZAL) 5 MG tablet Take 5 mg by mouth every evening.   Yes [provider]  LORazepam (ATIVAN) 1 MG tablet Take 1 mg by mouth every 8 (eight) hours as needed for anxiety.   Yes [provider]  metFORMIN (GLUCOPHAGE-XR) 500 MG 24 hr tablet Take 500 mg by mouth daily. 11/28/19  Yes [provider]  montelukast (SINGULAIR) 10 MG tablet Take 10 mg by mouth at bedtime.   Yes [provider]  omeprazole (PRILOSEC) 40 MG capsule Take 40 mg by mouth daily. 09/25/19  Yes [provider]  azelastine (ASTELIN) 0.1 % nasal spray Place 2 sprays into both nostrils 2 (two) times daily. 04/23/19 12/26/19  Ok Edwards, PA-C   Social History   Socioeconomic History  . Marital status: Single    Spouse name: Not on  file  . Number of children: Not on file  . Years of education: Not on file  . Highest education level: Not on file  Occupational History  . Not on file  Tobacco Use  . Smoking status: Former Smoker    Packs/day: 0.50    Types: Cigarettes    Start date: 01/16/1985    Quit date: 10/08/2019    Years since quitting: 0.3  . Smokeless tobacco: Never Used  Substance and Sexual Activity  . Alcohol use: No  . Drug use: No  . Sexual activity: Not on file  Other Topics Concern  . Not on file  Social History Narrative  . Not on file  Social Determinants of Health   Financial Resource Strain: Not on file  Food Insecurity: Not on file  Transportation Needs: Not on file  Physical Activity: Not on file  Stress: Not on file  Social Connections: Not on file  Intimate Partner Violence: Not on file    Review of Systems   Objective:   Vitals:   02/12/20 0817  BP: 100/67  Pulse: 93  Temp: (!) 97.3 F (36.3 C)  TempSrc: Temporal  SpO2: 97%  Weight: 272 lb (123.4 kg)  Height: 5' 6"  (1.676 m)     Physical Exam   50 minutes spent during visit, greater than 50% counseling and assimilation of information, chart review, and discussion of plan.    Assessment & Plan:  Teresa Castaneda is a 50 y.o. female . Hypothyroidism, unspecified type - Plan: TSH  -Check labs.  If adjustment in medication needed, with use of Armour Thyroid I did recommend endocrinology referral.  Wait on lab results first.  If stable can continue to monitor on current dose Armour Thyroid.  Type 2 diabetes mellitus with hyperglycemia, without long-term current use of insulin (HCC) - Plan: metFORMIN (GLUCOPHAGE) 500 MG tablet, Hemoglobin A1c, Microalbumin / creatinine urine ratio  -Increase Metformin to 500 mg twice daily for improved control., check labs.  Anxiety state - Plan: escitalopram (LEXAPRO) 10 MG tablet  -Recommended trial of SSRI, different SSRI and will try Lexapro with potential side effects  discussed.  Counseling recommended.  Infrequent lorazepam okay for now with goal of minimizing need.  Elevated LFTs Hepatic steatosis  -Likely hepatic steatosis, consider GI eval, repeat labs in 6 weeks.  Thrombocytopenia ([place]) - Plan: CBC  -Repeat CBC, may need to stop aspirin.  -Saline nasal spray, humidifier to lessen risk of epistaxis, RTC precautions.  Need for prophylactic vaccination with combined diphtheria-tetanus-pertussis (DTP) vaccine - Plan: Td : Tetanus/diphtheria >7yo Preservative  free   Meds ordered this encounter  Medications  . metFORMIN (GLUCOPHAGE) 500 MG tablet    Sig: Take 1 tablet (500 mg total) by mouth 2 (two) times daily with a meal.    Dispense:  180 tablet    Refill:  1  . escitalopram (LEXAPRO) 10 MG tablet    Sig: Take 1 tablet (10 mg total) by mouth daily.    Dispense:  30 tablet    Refill:  2   Patient Instructions   You can try lexapro, but I also recommend meeting with therapist.  Here are some number. Ativan for breakthrough anxiety for now ok.   Here are a few options for counseling:  [facility]:  [place] [phone]  If thyroid is stable, I can refill armour thyroid. If levels are off or adjustments needed I would need you to meet with endocrinologist or consider synthroid.   Plan on recheck liver tests in next 6 weeks. If increasing, will need to meet with gastroenterology. If any new pain, nausea or vomiting be seen right away.   May need to stop aspirin. I will check blood counts  Increase metformin to 523m twice per day and recheck in 6 weeks.   Continue saline nasal spray, humidifier in room where you sleep may help as well.   Return to the clinic or go to the nearest emergency room if any of your symptoms worsen or new symptoms occur.    Managing Anxiety, Adult After being diagnosed with an anxiety disorder, you may be relieved to know why you have felt or  behaved a certain way. You may also feel overwhelmed about the treatment ahead and what it will mean for your life. With care and support, you can manage this condition and recover from it. How to manage lifestyle changes Managing stress and anxiety Stress is your body's reaction to life changes and events, both good and bad. Most stress will last just a few hours, but stress can be ongoing and can lead to more than just stress. Although stress can play a major role in anxiety, it is not the same as anxiety. Stress is usually caused by something external, such as a deadline, test, or competition. Stress normally passes after the triggering event has ended.  Anxiety is caused by something internal, such as imagining a terrible outcome or worrying that something will go wrong that will devastate you. Anxiety often does not go away even after the triggering event is over, and it can become long-term (chronic) worry. It is important to understand the differences between stress and anxiety and to manage your stress effectively so that it does not lead to an anxious response. Talk with your health care provider or a counselor to learn more about reducing anxiety and stress. He or she may suggest tension reduction techniques, such as:  Music therapy. This can include creating or listening to music that you enjoy and that inspires you.  Mindfulness-based meditation. This involves being aware of your normal breaths while not trying to control your breathing. It can be done while sitting or walking.  Centering prayer. This involves focusing on a word, phrase, or sacred image that means something to you and brings you peace.  Deep breathing. To do this, expand your stomach and inhale slowly through your nose. Hold your breath for 3-5 seconds. Then exhale slowly, letting your stomach muscles relax.  Self-talk. This involves identifying thought patterns that lead to anxiety reactions and changing those  patterns.  Muscle relaxation. This involves tensing muscles and then relaxing them. Choose a tension reduction technique that suits your lifestyle and personality. These techniques take time and practice. Set aside 5-15 minutes a day to do them. Therapists can offer counseling and training in these techniques. The training to help with anxiety may be covered by some insurance plans. Other things you can do to manage stress and anxiety include:  Keeping a stress/anxiety diary. This can help you learn what triggers your reaction and then learn ways to manage your response.  Thinking about how you react to certain situations. You may not be able to control everything, but you can control your response.  Making time for activities that help you relax and not feeling guilty about spending your time in this way.  Visual imagery and yoga can help you stay calm and relax.   Medicines Medicines can help ease symptoms. Medicines for anxiety include:  Anti-anxiety drugs.  Antidepressants. Medicines are often used as a primary treatment for anxiety disorder. Medicines will be prescribed by a health care provider. When used together, medicines, psychotherapy, and tension reduction techniques may be the most effective treatment. Relationships Relationships can play a big part in helping you recover. Try to spend more time connecting with trusted friends and family members. Consider going to couples counseling, taking family education classes, or going to family therapy. Therapy can help you and others better understand your condition. How to recognize changes in your anxiety Everyone responds differently to treatment for anxiety. Recovery from anxiety happens when symptoms decrease and stop interfering with your daily  activities at home or work. This may mean that you will start to:  Have better concentration and focus. Worry will interfere less in your daily thinking.  Sleep better.  Be less  irritable.  Have more energy.  Have improved memory. It is important to recognize when your condition is getting worse. Contact your health care provider if your symptoms interfere with home or work and you feel like your condition is not improving. Follow these instructions at home: Activity  Exercise. Most adults should do the following: ? Exercise for at least 150 minutes each week. The exercise should increase your heart rate and make you sweat (moderate-intensity exercise). ? Strengthening exercises at least twice a week.  Get the right amount and quality of sleep. Most adults need 7-9 hours of sleep each night. Lifestyle  Eat a healthy diet that includes plenty of vegetables, fruits, whole grains, low-fat dairy products, and lean protein. Do not eat a lot of foods that are high in solid fats, added sugars, or salt.  Make choices that simplify your life.  Do not use any products that contain nicotine or tobacco, such as cigarettes, e-cigarettes, and chewing tobacco. If you need help quitting, ask your health care provider.  Avoid caffeine, alcohol, and certain over-the-counter cold medicines. These may make you feel worse. Ask your pharmacist which medicines to avoid.   General instructions  Take over-the-counter and prescription medicines only as told by your health care provider.  Keep all follow-up visits as told by your health care provider. This is important. Where to find support You can get help and support from these sources:  Self-help groups.  Online and [employer].  A trusted spiritual leader.  Couples counseling.  Family education classes.  Family therapy. Where to find more information You may find that joining a support group helps you deal with your anxiety. The following sources can help you locate counselors or support groups near you:  [place]: www.mentalhealth[place].net  Anxiety and Depression Association of [place]  (ADAA): https://www.clark.net/  National Alliance on Mental Illness (NAMI): www.nami.org Contact a health care provider if you:  Have a hard time staying focused or finishing daily tasks.  Spend many hours a day feeling worried about everyday life.  Become exhausted by worry.  Start to have headaches, feel tense, or have nausea.  Urinate more than normal.  Have diarrhea. Get help right away if you have:  A racing heart and shortness of breath.  Thoughts of hurting yourself or others. If you ever feel like you may hurt yourself or others, or have thoughts about taking your own life, get help right away. You can go to your nearest emergency department or call:  Your local emergency services (911 in the [place]  A suicide crisis helpline, such as the [place] at 409-024-4500. This is open 24 hours a day. Summary  Taking steps to learn and use tension reduction techniques can help calm you and help prevent triggering an anxiety reaction.  When used together, medicines, psychotherapy, and tension reduction techniques may be the most effective treatment.  Family, friends, and partners can play a big part in helping you recover from an anxiety disorder. This information is not intended to replace advice given to you by your health care provider. Make sure you discuss any questions you have with your health care provider. Document Revised: 06/04/2018 Document Reviewed: 06/04/2018 Elsevier Patient Education  2021 [employer].    If you have lab work done  today you will be contacted with your lab results within the next 2 weeks.  If you have not heard from [place] then please contact us. The fastest way to get your results is to register for My Chart.   IF you received an x-ray today, you will receive an invoice from [facility]. Please contact [facility] at [phone] with questions or concerns regarding your invoice.   IF you received labwork  today, you will receive an invoice from [facility]. Please contact [facility] at [phone] with questions or concerns regarding your invoice.   Our billing staff will not be able to assist you with questions regarding bills from these companies.  You will be contacted with the lab results as soon as they are available. The fastest way to get your results is to activate your My Chart account. Instructions are located on the last page of this paperwork. If you have not heard from [place] regarding the results in 2 weeks, please contact this office.         Signed, Merri Ray, MD [facility]

## 2020-02-12 NOTE — Patient Instructions (Addendum)
You can try lexapro, but I also recommend meeting with therapist.  Here are some number. Ativan for breakthrough anxiety for now ok.   Here are a few options for counseling:  [facility]:  [place] [phone]  If thyroid is stable, I can refill armour thyroid. If levels are off or adjustments needed I would need you to meet with endocrinologist or consider synthroid.   Plan on recheck liver tests in next 6 weeks. If increasing, will need to meet with gastroenterology. If any new pain, nausea or vomiting be seen right away.   May need to stop aspirin. I will check blood counts  Increase metformin to 562m twice per day and recheck in 6 weeks.   Continue saline nasal spray, humidifier in room where you sleep may help as well.   Return to the clinic or go to the nearest emergency room if any of your symptoms worsen or new symptoms occur.    Managing Anxiety, Adult After being diagnosed with an anxiety disorder, you may be relieved to know why you have felt or behaved a certain way. You may also feel overwhelmed about the treatment ahead and what it will mean for your life. With care and support, you can manage this condition and recover from it. How to manage lifestyle changes Managing stress and anxiety Stress is your body's reaction to life changes and events, both good and bad. Most stress will last just a few hours, but stress can be ongoing and can lead to more than just stress. Although stress can play a major role in anxiety, it is not the same as anxiety. Stress is usually caused by something external, such as a deadline, test, or competition. Stress normally passes after the triggering event has ended.  Anxiety is caused by something internal, such as imagining a terrible outcome or worrying that something will go wrong that will devastate you. Anxiety often does not go away even after the triggering event is over, and it  can become long-term (chronic) worry. It is important to understand the differences between stress and anxiety and to manage your stress effectively so that it does not lead to an anxious response. Talk with your health care provider or a counselor to learn more about reducing anxiety and stress. He or she may suggest tension reduction techniques, such as:  Music therapy. This can include creating or listening to music that you enjoy and that inspires you.  Mindfulness-based meditation. This involves being aware of your normal breaths while not trying to control your breathing. It can be done while sitting or walking.  Centering prayer. This involves focusing on a word, phrase, or sacred image that means something to you and brings you peace.  Deep breathing. To do this, expand your stomach and inhale slowly through your nose. Hold your breath for 3-5 seconds. Then exhale slowly, letting your stomach muscles relax.  Self-talk. This involves identifying thought patterns that lead to anxiety reactions and changing those patterns.  Muscle relaxation. This involves tensing muscles and then relaxing them. Choose a tension reduction technique that suits your lifestyle and personality. These techniques take time and practice. Set aside 5-15 minutes a day to do them. Therapists can offer counseling and training in these techniques. The training to help with anxiety may be covered by some insurance plans. Other things you can do to manage stress and anxiety include:  Keeping a stress/anxiety diary. This can help you learn what triggers your reaction  and then learn ways to manage your response.  Thinking about how you react to certain situations. You may not be able to control everything, but you can control your response.  Making time for activities that help you relax and not feeling guilty about spending your time in this way.  Visual imagery and yoga can help you stay calm and relax.    Medicines Medicines can help ease symptoms. Medicines for anxiety include:  Anti-anxiety drugs.  Antidepressants. Medicines are often used as a primary treatment for anxiety disorder. Medicines will be prescribed by a health care provider. When used together, medicines, psychotherapy, and tension reduction techniques may be the most effective treatment. Relationships Relationships can play a big part in helping you recover. Try to spend more time connecting with trusted friends and family members. Consider going to couples counseling, taking family education classes, or going to family therapy. Therapy can help you and others better understand your condition. How to recognize changes in your anxiety Everyone responds differently to treatment for anxiety. Recovery from anxiety happens when symptoms decrease and stop interfering with your daily activities at home or work. This may mean that you will start to:  Have better concentration and focus. Worry will interfere less in your daily thinking.  Sleep better.  Be less irritable.  Have more energy.  Have improved memory. It is important to recognize when your condition is getting worse. Contact your health care provider if your symptoms interfere with home or work and you feel like your condition is not improving. Follow these instructions at home: Activity  Exercise. Most adults should do the following: ? Exercise for at least 150 minutes each week. The exercise should increase your heart rate and make you sweat (moderate-intensity exercise). ? Strengthening exercises at least twice a week.  Get the right amount and quality of sleep. Most adults need 7-9 hours of sleep each night. Lifestyle  Eat a healthy diet that includes plenty of vegetables, fruits, whole grains, low-fat dairy products, and lean protein. Do not eat a lot of foods that are high in solid fats, added sugars, or salt.  Make choices that simplify your life.  Do  not use any products that contain nicotine or tobacco, such as cigarettes, e-cigarettes, and chewing tobacco. If you need help quitting, ask your health care provider.  Avoid caffeine, alcohol, and certain over-the-counter cold medicines. These may make you feel worse. Ask your pharmacist which medicines to avoid.   General instructions  Take over-the-counter and prescription medicines only as told by your health care provider.  Keep all follow-up visits as told by your health care provider. This is important. Where to find support You can get help and support from these sources:  Self-help groups.  Online and [employer].  A trusted spiritual leader.  Couples counseling.  Family education classes.  Family therapy. Where to find more information You may find that joining a support group helps you deal with your anxiety. The following sources can help you locate counselors or support groups near you:  [place]: www.mentalhealthamerica.net  Anxiety and Depression Association of [place] (ADAA): https://www.clark.net/  National Alliance on Mental Illness (NAMI): www.nami.org Contact a health care provider if you:  Have a hard time staying focused or finishing daily tasks.  Spend many hours a day feeling worried about everyday life.  Become exhausted by worry.  Start to have headaches, feel tense, or have nausea.  Urinate more than normal.  Have diarrhea. Get help right  away if you have:  A racing heart and shortness of breath.  Thoughts of hurting yourself or others. If you ever feel like you may hurt yourself or others, or have thoughts about taking your own life, get help right away. You can go to your nearest emergency department or call:  Your local emergency services (911 in the [place]  A suicide crisis helpline, such as the [place] [facility] at 787-520-3089. This is open 24 hours a day. Summary  Taking steps to learn and use  tension reduction techniques can help calm you and help prevent triggering an anxiety reaction.  When used together, medicines, psychotherapy, and tension reduction techniques may be the most effective treatment.  Family, friends, and partners can play a big part in helping you recover from an anxiety disorder. This information is not intended to replace advice given to you by your health care provider. Make sure you discuss any questions you have with your health care provider. Document Revised: 06/04/2018 Document Reviewed: 06/04/2018 Elsevier Patient Education  [place].    If you have lab work done today you will be contacted with your lab results within the next 2 weeks.  If you have not heard from [place] then please contact us. The fastest way to get your results is to register for My Chart.   IF you received an x-ray today, you will receive an invoice from [facility]. Please contact [facility] at [phone] with questions or concerns regarding your invoice.   IF you received labwork today, you will receive an invoice from [place]. Please contact [facility] at [phone] with questions or concerns regarding your invoice.   Our billing staff will not be able to assist you with questions regarding bills from these companies.  You will be contacted with the lab results as soon as they are available. The fastest way to get your results is to activate your My Chart account. Instructions are located on the last page of this paperwork. If you have not heard from [place] regarding the results in 2 weeks, please contact this office.

## 2020-02-12 NOTE — Patient Instructions (Signed)
It was great to see you today!  For your thumb you can try one of the following topical creams: -Voltaren gel -Biofreeze -Capsaicin  We have placed an order for a repeat SI joint injection. Cathy from [facility] should be calling you to schedule.  Please do the exercises you were given today to strengthen your leg muscles.  Follow up with [place] as needed.

## 2020-02-13 ENCOUNTER — Other Ambulatory Visit: Payer: Self-pay | Admitting: Sports Medicine

## 2020-02-13 DIAGNOSIS — M461 Sacroiliitis, not elsewhere classified: Secondary | ICD-10-CM

## 2020-02-13 LAB — HEMOGLOBIN A1C
Est. average glucose Bld gHb Est-mCnc: 235 mg/dL
Hgb A1c MFr Bld: 9.8 % — ABNORMAL HIGH (ref 4.8–5.6)

## 2020-02-13 LAB — CBC
Hematocrit: 43.8 % (ref 34.0–46.6)
Hemoglobin: 14.4 g/dL (ref 11.1–15.9)
MCH: 28.6 pg (ref 26.6–33.0)
MCHC: 32.9 g/dL (ref 31.5–35.7)
MCV: 87 fL (ref 79–97)
Platelets: 114 10*3/uL — ABNORMAL LOW (ref 150–450)
RBC: 5.04 x10E6/uL (ref 3.77–5.28)
RDW: 12.2 % (ref 11.7–15.4)
WBC: 6.3 10*3/uL (ref 3.4–10.8)

## 2020-02-13 LAB — TSH: TSH: 29.7 u[IU]/mL — ABNORMAL HIGH (ref 0.450–4.500)

## 2020-02-13 LAB — MICROALBUMIN / CREATININE URINE RATIO
Creatinine, Urine: 368.9 mg/dL
Microalb/Creat Ratio: 11 mg/g creat (ref 0–29)
Microalbumin, Urine: 41.1 ug/mL

## 2020-02-13 NOTE — Progress Notes (Signed)
   Subjective:    Patient ID: Teresa Castaneda, female    DOB: 1970/07/30, 50 y.o.   MRN: [id number]  HPI chief complaint: Right-sided low back pain and bilateral thumb pain  Levon comes in today with a couple of different complaints.  She has returning right-sided low back pain which will radiate into the right buttock and right hip.  Pain is identical in nature to what she experienced previously with her SI joint pain.  That pain improved dramatically with an SI joint injection done at [place].  Her pain is beginning to return although it is not as severe as it once was.  Since her last office visit with me, she has been diagnosed with Covid.  Also diagnosed with fatty liver disease after blood work showed elevated liver enzymes.  As a result, she has been instructed to decrease use of her ibuprofen and she feels like this may be contributing to her increasing pain.  She is also complaining of bilateral thumb pain that she localizes to the [facility] joint.  No trauma.  She has not noticed any swelling.  No specific treatment to date.  Interim medical history reviewed Medications reviewed Allergies reviewed    Review of Systems As above    Objective:   Physical Exam  Well-developed, well-nourished.  No acute distress  There is tenderness to palpation directly over the right SI joint.  Examination of both thumbs show good range of motion bilaterally.  No soft tissue swelling and no hypertrophy at the [facility] joint.  Positive CMC grind bilaterally.  Negative Finkelstein's bilaterally.  Good strength.  Good pulses.      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  I had a long talk with her regarding the importance of regaining strength in her legs and hips.  Her long bout with Covid has  no doubt left her weak which is contributing to her low back pain.  We will give her some simple quad sets, isometric quad exercises, and hip abductor strengthening exercises to start doing at home.  I also recommended that she start a sit to stand exercise daily building up repetitions as she gains strength.  Although she does need to be cautious with NSAIDs given her elevated LFTs, I do think she is okay to continue with them as needed for severe pain. In regards to her thumbs, I recommended topical pain relief either in the form of Voltaren gel, Biofreeze, or capsaicin. Follow-up as needed.

## 2020-02-14 ENCOUNTER — Encounter: Payer: Self-pay | Admitting: Family Medicine

## 2020-02-14 MED ORDER — THYROID 15 MG PO TABS: 15.0000 mg | ORAL_TABLET | Freq: Every day | ORAL | 2 refills | Status: DC

## 2020-02-14 NOTE — Addendum Note (Signed)
Addended by: Merri Ray R on: 02/14/2020 01:01 PM   Modules accepted: Orders

## 2020-02-15 ENCOUNTER — Encounter: Payer: Self-pay | Admitting: Family Medicine

## 2020-02-15 DIAGNOSIS — E039 Hypothyroidism, unspecified: Secondary | ICD-10-CM

## 2020-02-15 DIAGNOSIS — E1165 Type 2 diabetes mellitus with hyperglycemia: Secondary | ICD-10-CM

## 2020-02-20 ENCOUNTER — Ambulatory Visit
Admission: RE | Admit: 2020-02-20 | Discharge: 2020-02-20 | Disposition: A | Discharge status: Home / Self Care | Payer: Commercial Managed Care - PPO | Source: Ambulatory Visit | Attending: Sports Medicine | Admitting: Sports Medicine

## 2020-02-20 DIAGNOSIS — M461 Sacroiliitis, not elsewhere classified: Secondary | ICD-10-CM

## 2020-02-20 MED ORDER — METHYLPREDNISOLONE ACETATE 40 MG/ML INJ SUSP (RADIOLOG
120.0000 mg | Freq: Once | INTRAMUSCULAR | Status: AC
  Administered 2020-02-20: 120 mg via INTRA_ARTICULAR

## 2020-02-20 MED ORDER — IOPAMIDOL (ISOVUE-M 200) INJECTION 41%
1.0000 mL | Freq: Once | INTRAMUSCULAR | Status: AC
  Administered 2020-02-20: 1 mL via INTRA_ARTICULAR

## 2020-02-20 NOTE — Discharge Instructions (Signed)

## 2020-02-26 ENCOUNTER — Encounter: Payer: Self-pay | Admitting: Pulmonary Disease

## 2020-03-10 ENCOUNTER — Encounter: Payer: Self-pay | Admitting: Family Medicine

## 2020-03-10 ENCOUNTER — Other Ambulatory Visit: Payer: Self-pay

## 2020-03-10 ENCOUNTER — Telehealth (INDEPENDENT_AMBULATORY_CARE_PROVIDER_SITE_OTHER): Payer: Commercial Managed Care - PPO | Admitting: Family Medicine

## 2020-03-10 VITALS — Ht 66.0 in | Wt 270.0 lb

## 2020-03-10 DIAGNOSIS — J0110 Acute frontal sinusitis, unspecified: Secondary | ICD-10-CM

## 2020-03-10 MED ORDER — AZELASTINE-FLUTICASONE 137-50 MCG/ACT NA SUSP
1.0000 | Freq: Two times a day (BID) | NASAL | 6 refills | Status: DC
Start: 2020-03-10 — End: 2020-10-20

## 2020-03-10 MED ORDER — PSEUDOEPHEDRINE HCL ER 120 MG PO TB12
120.0000 mg | ORAL_TABLET | Freq: Two times a day (BID) | ORAL | 0 refills | Status: DC
Start: 2020-03-10 — End: 2020-05-10

## 2020-03-10 NOTE — Patient Instructions (Addendum)
Sinusitis, Adult Sinusitis is soreness and swelling (inflammation) of your sinuses. Sinuses are hollow spaces in the bones around your face. They are located:  Around your eyes.  In the middle of your forehead.  Behind your nose.  In your cheekbones. Your sinuses and nasal passages are lined with a fluid called mucus. Mucus drains out of your sinuses. Swelling can trap mucus in your sinuses. This lets germs (bacteria, virus, or fungus) grow, which leads to infection. Most of the time, this condition is caused by a virus. What are the causes? This condition is caused by:  Allergies.  Asthma.  Germs.  Things that block your nose or sinuses.  Growths in the nose (nasal polyps).  Chemicals or irritants in the air.  Fungus (rare). What increases the risk? You are more likely to develop this condition if:  You have a weak body defense system (immune system).  You do a lot of swimming or diving.  You use nasal sprays too much.  You smoke. What are the signs or symptoms? The main symptoms of this condition are pain and a feeling of pressure around the sinuses. Other symptoms include:  Stuffy nose (congestion).  Runny nose (drainage).  Swelling and warmth in the sinuses.  Headache.  Toothache.  A cough that may get worse at night.  Mucus that collects in the throat or the back of the nose (postnasal drip).  Being unable to smell and taste.  Being very tired (fatigue).  A fever.  Sore throat.  Bad breath. How is this diagnosed? This condition is diagnosed based on:  Your symptoms.  Your medical history.  A physical exam.  Tests to find out if your condition is short-term (acute) or long-term (chronic). Your doctor may: ? Check your nose for growths (polyps). ? Check your sinuses using a tool that has a light (endoscope). ? Check for allergies or germs. ? Do imaging tests, such as an MRI or CT scan. How is this treated? Treatment for this  condition depends on the cause and whether it is short-term or long-term.  If caused by a virus, your symptoms should go away on their own within 10 days. You may be given medicines to relieve symptoms. They include: ? Medicines that shrink swollen tissue in the nose. ? Medicines that treat allergies (antihistamines). ? A spray that treats swelling of the nostrils. ? Rinses that help get rid of thick mucus in your nose (nasal saline washes).  If caused by bacteria, your doctor may wait to see if you will get better without treatment. You may be given antibiotic medicine if you have: ? A very bad infection. ? A weak body defense system.  If caused by growths in the nose, you may need to have surgery. Follow these instructions at home: Medicines  Take, use, or apply over-the-counter and prescription medicines only as told by your doctor. These may include nasal sprays.  If you were prescribed an antibiotic medicine, take it as told by your doctor. Do not stop taking the antibiotic even if you start to feel better. Hydrate and humidify  Drink enough water to keep your pee (urine) pale yellow.  Use a cool mist humidifier to keep the humidity level in your home above 50%.  Breathe in steam for 10-15 minutes, 3-4 times a day, or as told by your doctor. You can do this in the bathroom while a hot shower is running.  Try not to spend time in cool or dry  air.   Rest  Rest as much as you can.  Sleep with your head raised (elevated).  Make sure you get enough sleep each night. General instructions  Put a warm, moist washcloth on your face 3-4 times a day, or as often as told by your doctor. This will help with discomfort.  Wash your hands often with soap and water. If there is no soap and water, use hand sanitizer.  Do not smoke. Avoid being around people who are smoking (secondhand smoke).  Keep all follow-up visits as told by your doctor. This is important.   Contact a doctor  if:  You have a fever.  Your symptoms get worse.  Your symptoms do not get better within 10 days. Get help right away if:  You have a very bad headache.  You cannot stop throwing up (vomiting).  You have very bad pain or swelling around your face or eyes.  You have trouble seeing.  You feel confused.  Your neck is stiff.  You have trouble breathing. Summary  Sinusitis is swelling of your sinuses. Sinuses are hollow spaces in the bones around your face.  This condition is caused by tissues in your nose that become inflamed or swollen. This traps germs. These can lead to infection.  If you were prescribed an antibiotic medicine, take it as told by your doctor. Do not stop taking it even if you start to feel better.  Keep all follow-up visits as told by your doctor. This is important. This information is not intended to replace advice given to you by your health care provider. Make sure you discuss any questions you have with your health care provider. Document Revised: 06/04/2017 Document Reviewed: 06/04/2017 Elsevier Patient Education  2021 [employer].   If you have lab work done today you will be contacted with your lab results within the next 2 weeks.  If you have not heard from [place] then please contact us. The fastest way to get your results is to register for My Chart.   IF you received an x-ray today, you will receive an invoice from [facility]. Please contact [facility] at [phone] with questions or concerns regarding your invoice.   IF you received labwork today, you will receive an invoice from [facility]. Please contact [facility] at [phone] with questions or concerns regarding your invoice.   Our billing staff will not be able to assist you with questions regarding bills from these companies.  You will be contacted with the lab results as soon as they are available. The fastest way to get your results is to activate your My Chart  account. Instructions are located on the last page of this paperwork. If you have not heard from [place] regarding the results in 2 weeks, please contact this office.

## 2020-03-10 NOTE — Progress Notes (Signed)
Virtual Visit Note  I connected with patient on 03/10/20 at 0918 by telelphone due to unable to work [employer] video visit and verified that I am speaking with the correct person using two identifiers. Teresa Castaneda is currently located at home and no family members are currently with them during visit. The provider, Laurita Quint Kreston Ahrendt, FNP is located in their office at time of visit.  I discussed the limitations, risks, security and privacy concerns of performing an evaluation and management service by telephone and the availability of in person appointments. I also discussed with the patient that there may be a patient responsible charge related to this service. The patient expressed understanding and agreed to proceed.   I provided 20 minutes of non-face-to-face time during this encounter.  Chief Complaint  Patient presents with  . Sinus Problem    This is going on for a week and has fluid in the ear     HPI ? Has had sinus issues and inner ear on the left for a long time A week ago started getting headaches Taking ibuprofen for her headaches   Takes Singulair daily Normally takes Tylenol sinus, unable to take anymore due to elevated liver function Xyzal daily Has been doing the saline solution nasal spray Cannot use flonase due to burning Has been having minimal relief with this regimen    Allergies  Allergen Reactions  . Clarithromycin Other (See Comments)    Body cramps   . Codeine Nausea And Vomiting    Prior to Admission medications   Medication Sig Start Date End Date Taking? Authorizing Provider  albuterol (VENTOLIN HFA) 108 (90 Base) MCG/ACT inhaler Inhale 2 puffs into the lungs every 6 (six) hours as needed. For shortness of breath and wheezing 09/01/19  Yes Ok Edwards, PA-C  ARMOUR THYROID 180 MG tablet Take 180 mg by mouth daily. 09/07/19  Yes [provider]  atorvastatin (LIPITOR) 10 MG tablet Take 1 tablet (10 mg total) by mouth daily. 12/17/19  Yes  Jettie Booze, MD  escitalopram (LEXAPRO) 10 MG tablet Take 1 tablet (10 mg total) by mouth daily. 02/12/20  Yes Wendie Agreste, MD  Fluticasone-Umeclidin-Vilant (TRELEGY ELLIPTA) 100-62.5-25 MCG/INH AEPB Inhale 1 puff into the lungs daily. 10/13/19  Yes Freddi Starr, MD  furosemide (LASIX) 20 MG tablet Take 0.5 tablets (10 mg total) by mouth daily. 11/17/19  Yes Freddi Starr, MD  ibuprofen (ADVIL) 600 MG tablet Take 1 tablet (600 mg total) by mouth every 6 (six) hours as needed. 12/26/19  Yes Lamptey, Myrene Galas, MD  levocetirizine (XYZAL) 5 MG tablet Take 5 mg by mouth every evening.   Yes [provider]  LORazepam (ATIVAN) 1 MG tablet Take 1 mg by mouth every 8 (eight) hours as needed for anxiety.   Yes [provider]  metFORMIN (GLUCOPHAGE) 500 MG tablet Take 1 tablet (500 mg total) by mouth 2 (two) times daily with a meal. 02/12/20  Yes Wendie Agreste, MD  montelukast (SINGULAIR) 10 MG tablet Take 10 mg by mouth at bedtime.   Yes [provider]  omeprazole (PRILOSEC) 40 MG capsule Take 40 mg by mouth daily. 09/25/19  Yes [provider]  thyroid (ARMOUR THYROID) 15 MG tablet Take 1 tablet (15 mg total) by mouth daily. For total dose 196m qd. 02/14/20  Yes GWendie Agreste MD  azelastine (ASTELIN) 0.1 % nasal spray Place 2 sprays into both nostrils 2 (two) times daily. 04/23/19 12/26/19  Arturo Morton    Past Medical History:  Diagnosis Date  . Allergies   . Arthritis   . Asthma   . Back pain    Spinal Issue   . DJD (degenerative joint disease), lumbar     Past Surgical History:  Procedure Laterality Date  . ABDOMINAL HYSTERECTOMY      Social History   Tobacco Use  . Smoking status: Former Smoker    Packs/day: 0.50    Types: Cigarettes    Start date: 01/16/1985    Quit date: 10/08/2019    Years since quitting: 0.4  . Smokeless tobacco: Never Used  Substance Use Topics  . Alcohol use: No    Family History  Problem  Relation Age of Onset  . Coronary artery disease Mother   . Diabetes Mother   . COPD Mother   . Coronary artery disease Father   . Hypertension Father     Review of Systems  Constitutional: Negative for chills, fever and malaise/fatigue.  HENT: Positive for congestion and sinus pain. Negative for sore throat.   Eyes: Positive for pain.  Respiratory: Negative for cough, sputum production, shortness of breath and wheezing.   Cardiovascular: Negative for chest pain and palpitations.  Musculoskeletal: Negative for myalgias.  Neurological: Positive for headaches. Negative for dizziness.    Objective  Constitutional:      General: Not in acute distress.    Appearance: Normal appearance. Not ill-appearing.   Pulmonary:     Effort: Pulmonary effort is normal. No respiratory distress.  Neurological:     Mental Status: Alert and oriented to person, place, and time.  Psychiatric:        Mood and Affect: Mood normal.        Behavior: Behavior normal.     ASSESSMENT and PLAN  Problem List Items Addressed This Visit   None   Visit Diagnoses    Acute non-recurrent frontal sinusitis    -  Primary   Relevant Medications   Azelastine-Fluticasone (DYMISTA) 137-50 MCG/ACT SUSP   pseudoephedrine (SUDAFED 12 HOUR) 120 MG 12 hr tablet      Plan . Start bid Nasal spray . Decongestant bid . Continue to use Ibuprofen for headaches . Continue daily prescription medications . R/se/b of medications discussed . RTC/ED precautions provided  No follow-ups on file.    The above assessment and management plan was discussed with the patient. The patient verbalized understanding of and has agreed to the management plan. Patient is aware to call the clinic if symptoms persist or worsen. Patient is aware when to return to the clinic for a follow-up visit. Patient educated on when it is appropriate to go to the emergency department.     Huston Foley Raissa Dam, FNP-BC [facility] [place], [place] [address] Ph.  [phone] Fax [phone]

## 2020-03-24 LAB — HM DIABETES EYE EXAM

## 2020-03-25 ENCOUNTER — Other Ambulatory Visit: Payer: Self-pay

## 2020-03-25 ENCOUNTER — Encounter: Payer: Self-pay | Admitting: Family Medicine

## 2020-03-25 ENCOUNTER — Ambulatory Visit: Payer: Commercial Managed Care - PPO | Admitting: Family Medicine

## 2020-03-25 VITALS — BP 107/68 | HR 93 | Temp 97.6°F | Resp 16 | Ht 66.0 in | Wt 262.4 lb

## 2020-03-25 DIAGNOSIS — R0683 Snoring: Secondary | ICD-10-CM

## 2020-03-25 DIAGNOSIS — F411 Generalized anxiety disorder: Secondary | ICD-10-CM

## 2020-03-25 DIAGNOSIS — D696 Thrombocytopenia, unspecified: Secondary | ICD-10-CM

## 2020-03-25 DIAGNOSIS — E1165 Type 2 diabetes mellitus with hyperglycemia: Secondary | ICD-10-CM | POA: Diagnosis not present

## 2020-03-25 DIAGNOSIS — E039 Hypothyroidism, unspecified: Secondary | ICD-10-CM

## 2020-03-25 DIAGNOSIS — H6590 Unspecified nonsuppurative otitis media, unspecified ear: Secondary | ICD-10-CM

## 2020-03-25 DIAGNOSIS — R739 Hyperglycemia, unspecified: Secondary | ICD-10-CM

## 2020-03-25 DIAGNOSIS — R400 Somnolence: Secondary | ICD-10-CM

## 2020-03-25 LAB — GLUCOSE, POCT (MANUAL RESULT ENTRY): POC Glucose: 121 mg/dl — AB (ref 70–99)

## 2020-03-25 MED ORDER — METFORMIN HCL 1000 MG PO TABS: 1000.0000 mg | ORAL_TABLET | Freq: Two times a day (BID) | ORAL | 3 refills | Status: DC

## 2020-03-25 NOTE — Progress Notes (Signed)
Subjective:  Patient ID: Teresa Castaneda, female    DOB: Aug 07, 1970  Age: 50 y.o. MRN: [id number]  CC:  Chief Complaint  Patient presents with   Diabetes    Pt had eye exam yesterday reports no retinopathy, pt reports Metformin has been good lower numbers and denies side effects other than occasional dry mouth.    Anxiety    Pt here for recheck has been taking rx as prescribed, pt reports does feel need for more while at work due to higher stress has not had to take ativan, but does have it in case of need    Rash    Pt reports some spot on her Lt arm 1 week ago no itching or irritation just red dots present wanted to ensure this was not more worrisome     HPI Teresa Castaneda presents for follow-up from establish care visit in January  Anxiety/depression: Symptoms for years, discussed regimen last visit.  Had tried Zoloft and Prozac without relief, Wellbutrin caused her to feel need to smoke more.  Had been taking Ativan once per day then twice a day for 2 or 3 days if flare and anxiety symptoms, had decreased intermittent dosing at last visit. Started on Lexapro at last visit, with option for infrequent lorazepam dosing as needed. lexapro is helping. Taking at night - tired during the day. Still occasional anxiety flair, but better.  No ativan needed since last visit. Has been trying other coping techniques by handout.  Wakes up 4 times per night past month - always had some nighttime wakening - some increase recently. not dyspneic. no PND.   Snores. On 2 liters oxygen per night from pulmonary.  HST 11/09/19 - AHI 2.4/hr, not OSA.   Diabetes: With hyperglycemia Uncontrolled with A1c of 9.8 last visit.  At that time she was taking Metformin 500 mg daily, initially increased to twice daily dosing.no side effects with higher dose. Upset stomach at times, no change with metformin.  Taking metformin BID. Drinking plenty of fluids - dry mouth at times. watching sugar intake.  Home  readings - fasting 133, 166, 189  Has seen 250 twice since last visit. No lows.  Has lost some weight - unintentional, but more water and less sodas.  Wt Readings from Last 3 Encounters:  03/25/20 262 lb 6.4 oz (119 kg)  03/10/20 270 lb (122.5 kg)  02/12/20 270 lb (122.5 kg)    Lab Results  Component Value Date   HGBA1C 9.8 (H) 02/12/2020   Hypothyroidism: Lab Results  Component Value Date   TSH 29.700 (H) 02/12/2020  Uncontrolled last visit, she has been treated with Armour Thyroid.  180 mg/day.  Added 15 mg daily but also referred to endocrinology for further management.  Has appointment with Dr. Kelton Pillar March 21.  Repeat TSH today. No change in feeling on higher dose. Taking medication daily.  No new hot or cold intolerance. No new hair or skin changes, heart palpitations or new fatigue.   Thrombocytopenia Recommend stopping aspirin last visit.  Plan for repeat CBC today - off ASA since 1/27.  Lab Results  Component Value Date   WBC 6.3 02/12/2020   HGB 14.4 02/12/2020   HCT 43.8 02/12/2020   MCV 87 02/12/2020   PLT 114 (L) 02/12/2020   Left arm approximately past 1 week. Looks same - on change.  Thought was irritation. No pain/itching, no drainage. No other rash.  No vaginal bleeding/hematuria or bleeding gums.   Tx: none.  Left ear congestion Some congestion in ears past month. Had visit with Kelsea on 2/23 - started on Dymista and sudafed.  No afrin. Congestion has improved. Had some ear congestion at last visit - some better.   Hears voice in ears at times. No hearing loss.   History Patient Active Problem List   Diagnosis Date Noted   History of COVID-19 09/23/2019   Pneumonia due to COVID-19 virus 09/23/2019   Cough 09/23/2019   BMI 40.0-44.9, adult ([place]) 07/22/2019   Facet arthropathy, lumbar 07/22/2019   Sacroiliitis ([place]) 07/22/2019   ANXIETY 09/10/2006   DEPRESSION 09/10/2006   ASTHMA 09/10/2006   HEADACHE 09/10/2006   Past Medical  History:  Diagnosis Date   Allergies    Arthritis    Asthma    Back pain    Spinal Issue    DJD (degenerative joint disease), lumbar    Past Surgical History:  Procedure Laterality Date   ABDOMINAL HYSTERECTOMY     Allergies  Allergen Reactions   Clarithromycin Other (See Comments)    Body cramps    Codeine Nausea And Vomiting   Prior to Admission medications   Medication Sig Start Date End Date Taking? Authorizing Provider  albuterol (VENTOLIN HFA) 108 (90 Base) MCG/ACT inhaler Inhale 2 puffs into the lungs every 6 (six) hours as needed. For shortness of breath and wheezing 09/01/19  Yes Ok Edwards, PA-C  ARMOUR THYROID 180 MG tablet Take 180 mg by mouth daily. 09/07/19  Yes [provider]  atorvastatin (LIPITOR) 10 MG tablet Take 1 tablet (10 mg total) by mouth daily. 12/17/19  Yes Jettie Booze, MD  Azelastine-Fluticasone [facility]) 137-50 MCG/ACT SUSP Place 1 spray into the nose in the morning and at bedtime. 03/10/20  Yes Just, Laurita Quint, FNP  escitalopram (LEXAPRO) 10 MG tablet Take 1 tablet (10 mg total) by mouth daily. 02/12/20  Yes Wendie Agreste, MD  Fluticasone-Umeclidin-Vilant (TRELEGY ELLIPTA) 100-62.5-25 MCG/INH AEPB Inhale 1 puff into the lungs daily. 10/13/19  Yes Freddi Starr, MD  furosemide (LASIX) 20 MG tablet Take 0.5 tablets (10 mg total) by mouth daily. 11/17/19  Yes Freddi Starr, MD  ibuprofen (ADVIL) 600 MG tablet Take 1 tablet (600 mg total) by mouth every 6 (six) hours as needed. 12/26/19  Yes Lamptey, Myrene Galas, MD  levocetirizine (XYZAL) 5 MG tablet Take 5 mg by mouth every evening.   Yes [provider]  LORazepam (ATIVAN) 1 MG tablet Take 1 mg by mouth every 8 (eight) hours as needed for anxiety.   Yes [provider]  metFORMIN (GLUCOPHAGE) 500 MG tablet Take 1 tablet (500 mg total) by mouth 2 (two) times daily with a meal. 02/12/20  Yes Wendie Agreste, MD  montelukast (SINGULAIR) 10 MG tablet Take 10 mg  by mouth at bedtime.   Yes [provider]  omeprazole (PRILOSEC) 40 MG capsule Take 40 mg by mouth daily. 09/25/19  Yes [provider]  pseudoephedrine (SUDAFED 12 HOUR) 120 MG 12 hr tablet Take 1 tablet (120 mg total) by mouth 2 (two) times daily. 03/10/20  Yes Just, Laurita Quint, FNP  thyroid (ARMOUR THYROID) 15 MG tablet Take 1 tablet (15 mg total) by mouth daily. For total dose 119m qd. 02/14/20  Yes GWendie Agreste MD  azelastine (ASTELIN) 0.1 % nasal spray Place 2 sprays into both nostrils 2 (two) times daily. 04/23/19 12/26/19  [name] PA-C   Social History   Socioeconomic History   Marital  status: Single    Spouse name: Not on file   Number of children: Not on file   Years of education: Not on file   Highest education level: Not on file  Occupational History   Not on file  Tobacco Use   Smoking status: Former Smoker    Packs/day: 0.50    Types: Cigarettes    Start date: 01/16/1985    Quit date: 10/08/2019    Years since quitting: 0.4   Smokeless tobacco: Never Used  Substance and Sexual Activity   Alcohol use: No   Drug use: No   Sexual activity: Not on file  Other Topics Concern   Not on file  Social History Narrative   Not on file   Social Determinants of Health   Financial Resource Strain: Not on file  Food Insecurity: Not on file  Transportation Needs: Not on file  Physical Activity: Not on file  Stress: Not on file  Social Connections: Not on file  Intimate Partner Violence: Not on file    Review of Systems   Objective:   Vitals:   03/25/20 1502  BP: 107/68  Pulse: 93  Resp: 16  Temp: 97.6 F (36.4 C)  TempSrc: Temporal  Weight: 262 lb 6.4 oz (119 kg)  Height: 5' 6"  (1.676 m)     Physical Exam Vitals reviewed.  Constitutional:      Appearance: She is well-developed.  HENT:     Head: Normocephalic and atraumatic.     Right Ear: Ear canal and external ear normal.     Left Ear: Ear canal and external ear normal.      Ears:     Comments: Min clear fluid at base of left greater than right ear.  Eyes:     Conjunctiva/sclera: Conjunctivae normal.     Pupils: Pupils are equal, round, and reactive to light.  Neck:     Vascular: No carotid bruit.  Cardiovascular:     Rate and Rhythm: Normal rate and regular rhythm.     Heart sounds: Normal heart sounds.  Pulmonary:     Effort: Pulmonary effort is normal.     Breath sounds: Normal breath sounds.  Abdominal:     Palpations: Abdomen is soft. There is no pulsatile mass.     Tenderness: There is no abdominal tenderness.  Skin:    General: Skin is warm and dry.       Neurological:     Mental Status: She is alert and oriented to person, place, and time.  Psychiatric:        Behavior: Behavior normal.      52 minutes spent during visit, greater than 50% counseling and assimilation of information, chart review, and discussion of plan.   Results for orders placed or performed in visit on 03/25/20  POCT glucose (manual entry)  Result Value Ref Range   POC Glucose 121 (A) 70 - 99 mg/dl    Assessment & Plan:  DEAJA RIZO is a 50 y.o. female . Type 2 diabetes mellitus with hyperglycemia, without long-term current use of insulin (HCC) - Plan: CBC with Differential/Platelet, POCT glucose (manual entry), metFORMIN (GLUCOPHAGE) 1000 MG tablet, CANCELED: Hemoglobin A1c  -Decreased control, will try higher dose Metformin at 1000 mg twice daily with option of SGLT 2 or GLP-1 depending on tolerance of that higher dose and control.  Briefly discussed these classes today.  RTC precautions if new side effects.   -Lower reading in office today noted after she left.  Called her with option to monitor home readings and if remains in the lower 100s can stay at 500 mg twice daily, or slowly increase to 1000 mg if elevated home readings.  Understanding expressed.  Hypothyroidism, unspecified type - Plan: TSH  -Repeat TSH on higher dose of Armour Thyroid but keep  follow-up with endocrinology as planned.  Elevated blood sugar - Plan: CANCELED: Hemoglobin A1c  -As above  Thrombocytopenia (HCC)  -Repeat CBC to evaluate platelets, questionable small petechial rash on left arm.  If any spread to other areas should be seen right away.  Anxiety state  -Improved on Lexapro, continue same  Daytime somnolence - Plan: Ambulatory referral to Sleep Studies Snoring - Plan: Ambulatory referral to Sleep Studies  -Although previous home sleep test was reassuring, still with persistent daytime fatigue, snoring, frequent wakening.  Question of other sleep disorder such as PLMS.  Refer to sleep specialist.  Serous otitis media  -Likely component of ETD with previous sinus congestion.  Improving.  Continue current nasal sprays with option of ENT eval if not improving next few weeks.  Meds ordered this encounter  Medications   metFORMIN (GLUCOPHAGE) 1000 MG tablet    Sig: Take 1 tablet (1,000 mg total) by mouth 2 (two) times daily with a meal.    Dispense:  180 tablet    Refill:  3   Patient Instructions    Depending on platelets, may need to refer you to hematology. Rash on arm could be related to low platelets. If any spread or new areas, be seen right away.   Increase Metformin to 1000 mg twice per day, but if you have any side effects on that higher dose or continue to have high blood sugars we may need to change medicines or add a medication.  Okay to continue the nasal spray for the nasal congestion which should continue to help the ear congestion and sound sensation in the ear.  If that is not improving the next few weeks let me know and I am happy to refer you to ear nose and throat specialist but if any worsening please be seen by medical provider sooner.   I will refer you to a sleep specialist for the frequent wakening at night and the sedation during the day but your last sleep test was encouraging.   No change in Lexapro for now.   Keep  follow-up with endocrinology regarding your thyroid but I will check the thyroid test today.  Return to the clinic or go to the nearest emergency room if any of your symptoms worsen or new symptoms occur.   If you have lab work done today you will be contacted with your lab results within the next 2 weeks.  If you have not heard from [place] then please contact us. The fastest way to get your results is to register for My Chart.   IF you received an x-ray today, you will receive an invoice from [facility]. Please contact [facility] at [phone] with questions or concerns regarding your invoice.   IF you received labwork today, you will receive an invoice from [facility]. Please contact [facility] at [phone] with questions or concerns regarding your invoice.   Our billing staff will not be able to assist you with questions regarding bills from these companies.  You will be contacted with the lab results as soon as they are available. The fastest way to get your results is to activate your My Chart account. Instructions are located on  the last page of this paperwork. If you have not heard from [place] regarding the results in 2 weeks, please contact this office.         Signed, Merri Ray, MD [facility]

## 2020-03-25 NOTE — Patient Instructions (Addendum)
  Depending on platelets, may need to refer you to hematology. Rash on arm could be related to low platelets. If any spread or new areas, be seen right away.   Increase Metformin to 1000 mg twice per day, but if you have any side effects on that higher dose or continue to have high blood sugars we may need to change medicines or add a medication.  Okay to continue the nasal spray for the nasal congestion which should continue to help the ear congestion and sound sensation in the ear.  If that is not improving the next few weeks let me know and I am happy to refer you to ear nose and throat specialist but if any worsening please be seen by medical provider sooner.   I will refer you to a sleep specialist for the frequent wakening at night and the sedation during the day but your last sleep test was encouraging.   No change in Lexapro for now.   Keep follow-up with endocrinology regarding your thyroid but I will check the thyroid test today.  Return to the clinic or go to the nearest emergency room if any of your symptoms worsen or new symptoms occur.   If you have lab work done today you will be contacted with your lab results within the next 2 weeks.  If you have not heard from [place] then please contact us. The fastest way to get your results is to register for My Chart.   IF you received an x-ray today, you will receive an invoice from [facility]. Please contact [facility] at [phone] with questions or concerns regarding your invoice.   IF you received labwork today, you will receive an invoice from [facility]. Please contact [facility] at [phone] with questions or concerns regarding your invoice.   Our billing staff will not be able to assist you with questions regarding bills from these companies.  You will be contacted with the lab results as soon as they are available. The fastest way to get your results is to activate your My Chart account. Instructions are located  on the last page of this paperwork. If you have not heard from [place] regarding the results in 2 weeks, please contact this office.

## 2020-03-26 LAB — CBC WITH DIFFERENTIAL/PLATELET
Basophils Absolute: 0 10*3/uL (ref 0.0–0.2)
Basos: 0 %
EOS (ABSOLUTE): 0.2 10*3/uL (ref 0.0–0.4)
Eos: 3 %
Hematocrit: 39.9 % (ref 34.0–46.6)
Hemoglobin: 13.7 g/dL (ref 11.1–15.9)
Immature Grans (Abs): 0 10*3/uL (ref 0.0–0.1)
Immature Granulocytes: 0 %
Lymphocytes Absolute: 2.8 10*3/uL (ref 0.7–3.1)
Lymphs: 36 %
MCH: 28.5 pg (ref 26.6–33.0)
MCHC: 34.3 g/dL (ref 31.5–35.7)
MCV: 83 fL (ref 79–97)
Monocytes Absolute: 0.5 10*3/uL (ref 0.1–0.9)
Monocytes: 6 %
Neutrophils Absolute: 4.2 10*3/uL (ref 1.4–7.0)
Neutrophils: 55 %
Platelets: 124 10*3/uL — ABNORMAL LOW (ref 150–450)
RBC: 4.8 x10E6/uL (ref 3.77–5.28)
RDW: 12.9 % (ref 11.7–15.4)
WBC: 7.8 10*3/uL (ref 3.4–10.8)

## 2020-03-26 LAB — TSH: TSH: 0.059 u[IU]/mL — ABNORMAL LOW (ref 0.450–4.500)

## 2020-04-05 ENCOUNTER — Ambulatory Visit: Payer: Commercial Managed Care - PPO | Admitting: Internal Medicine

## 2020-04-05 ENCOUNTER — Other Ambulatory Visit: Payer: Self-pay

## 2020-04-05 ENCOUNTER — Encounter: Payer: Self-pay | Admitting: Internal Medicine

## 2020-04-05 VITALS — BP 120/78 | HR 89 | Ht 66.0 in | Wt 262.0 lb

## 2020-04-05 DIAGNOSIS — E1165 Type 2 diabetes mellitus with hyperglycemia: Secondary | ICD-10-CM | POA: Diagnosis not present

## 2020-04-05 DIAGNOSIS — R635 Abnormal weight gain: Secondary | ICD-10-CM | POA: Diagnosis not present

## 2020-04-05 DIAGNOSIS — E039 Hypothyroidism, unspecified: Secondary | ICD-10-CM

## 2020-04-05 LAB — POCT GLUCOSE (DEVICE FOR HOME USE): Glucose Fasting, POC: 188 mg/dL — AB (ref 70–99)

## 2020-04-05 NOTE — Progress Notes (Signed)
Name: Teresa Castaneda  MRN/ DOB: [id number], November 12, 1970   Age/ Sex: 50 y.o., female    PCP: Wendie Agreste, MD   Reason for Endocrinology Evaluation: Type 2 Diabetes Mellitus     Date of Initial Endocrinology Visit: 04/05/2020     PATIENT IDENTIFIER: Teresa Castaneda is a 50 y.o. female with a past medical history of T2DM and Hypothyroidism. The patient presented for initial endocrinology clinic visit on 04/05/2020 for consultative assistance with her diabetes management.    HPI: Ms. Gavitt was    Diagnosed with DM 08/2019 Prior Medications tried/Intolerance: Metformin started 08/2019 Currently checking blood sugars 1 x / day Hypoglycemia episodes : no             Hemoglobin A1c, peaking at 9.8% in 2022. Patient required assistance for hypoglycemia: no Patient has required hospitalization within the last 1 year from hyper or hypoglycemia: no  In terms of diet, the patient eats 2 meals a day, snacks 2x  a day   Has mild upset stomach since increasing metformin but its transient   She has been noted with weight gain since the age of 21 .   THYROID HISTORY: She has been diagnosed with hypothyroidism ~ 5 yrs ago  No prior sx or radiation to the neck  She was on Levothyroxine in he past but that didn't  work   Pt is on Armour thyroid 180 mg daily and 15 mg daily    HOME DIABETES REGIMEN: Metformin 1000 mg BID  Armour thyroid 180 mg daily  Armour thyroid 15 mg daily    Statin: yes ACE-I/ARB: no Prior Diabetic Education: no   METER DOWNLOAD SUMMARY: Did not bring    DIABETIC COMPLICATIONS: Microvascular complications:    Denies: CKD, retinopathy , neuropathy   Last eye exam: Completed 03/2020  Macrovascular complications:    Denies: CAD, PVD, CVA   PAST HISTORY: Past Medical History:  Past Medical History:  Diagnosis Date  . Allergies   . Arthritis   . Asthma   . Back pain    Spinal Issue   . DJD (degenerative joint disease), lumbar    Past  Surgical History:  Past Surgical History:  Procedure Laterality Date  . ABDOMINAL HYSTERECTOMY        Social History:  reports that she quit smoking about 5 months ago. Her smoking use included cigarettes. She started smoking about 35 years ago. She smoked 0.50 packs per day. She has never used smokeless tobacco. She reports that she does not drink alcohol and does not use drugs. Family History:  Family History  Problem Relation Age of Onset  . Coronary artery disease Mother   . Diabetes Mother   . COPD Mother   . Coronary artery disease Father   . Hypertension Father      HOME MEDICATIONS: Allergies as of 04/05/2020      Reactions   Clarithromycin Other (See Comments)   Body cramps    Codeine Nausea And Vomiting      Medication List       Accurate as of April 05, 2020  8:16 AM. If you have any questions, ask your nurse or doctor.        albuterol 108 (90 Base) MCG/ACT inhaler Commonly known as: VENTOLIN HFA Inhale 2 puffs into the lungs every 6 (six) hours as needed. For shortness of breath and wheezing   Armour Thyroid 180 MG tablet Generic drug: thyroid Take 180 mg by mouth daily.  thyroid 15 MG tablet Commonly known as: Armour Thyroid Take 1 tablet (15 mg total) by mouth daily. For total dose 176m qd.   atorvastatin 10 MG tablet Commonly known as: LIPITOR Take 1 tablet (10 mg total) by mouth daily.   Azelastine-Fluticasone 137-50 MCG/ACT Susp Commonly known as: Dymista Place 1 spray into the nose in the morning and at bedtime.   escitalopram 10 MG tablet Commonly known as: Lexapro Take 1 tablet (10 mg total) by mouth daily.   furosemide 20 MG tablet Commonly known as: Lasix Take 0.5 tablets (10 mg total) by mouth daily.   ibuprofen 600 MG tablet Commonly known as: ADVIL Take 1 tablet (600 mg total) by mouth every 6 (six) hours as needed.   levocetirizine 5 MG tablet Commonly known as: XYZAL Take 5 mg by mouth every evening.   LORazepam 1 MG  tablet Commonly known as: ATIVAN Take 1 mg by mouth every 8 (eight) hours as needed for anxiety.   metFORMIN 1000 MG tablet Commonly known as: GLUCOPHAGE Take 1 tablet (1,000 mg total) by mouth 2 (two) times daily with a meal.   montelukast 10 MG tablet Commonly known as: SINGULAIR Take 10 mg by mouth at bedtime.   omeprazole 40 MG capsule Commonly known as: PRILOSEC Take 40 mg by mouth daily.   pseudoephedrine 120 MG 12 hr tablet Commonly known as: Sudafed 12 Hour Take 1 tablet (120 mg total) by mouth 2 (two) times daily.   Trelegy Ellipta 100-62.5-25 MCG/INH Aepb Generic drug: Fluticasone-Umeclidin-Vilant Inhale 1 puff into the lungs daily.        ALLERGIES: Allergies  Allergen Reactions  . Clarithromycin Other (See Comments)    Body cramps   . Codeine Nausea And Vomiting     REVIEW OF SYSTEMS: A comprehensive ROS was conducted with the patient and is negative except as per HPI \   OBJECTIVE:   VITAL SIGNS: BP 120/78 (BP Location: Left Arm, Patient Position: Sitting, Cuff Size: Large)   Pulse 89   Ht 5' 6"  (1.676 m)   Wt 262 lb (118.8 kg)   SpO2 95%   BMI 42.29 kg/m    PHYSICAL EXAM:  General: Pt appears well and is in NAD  Neck: General: Supple without adenopathy or carotid bruits. Thyroid: Thyroid size normal.  No goiter or nodules appreciated.  Lungs: Clear with good BS bilat with no rales, rhonchi, or wheezes  Heart: RRR with normal S1 and S2 and no gallops; no murmurs; no rub  Abdomen: Normoactive bowel sounds, soft, nontender, without masses or organomegaly palpable. Pt with abdominal striae , the lateral ones are a bit darker then the rest.   Extremities:  Lower extremities - No pretibial edema. No lesions.  Neuro: MS is good with appropriate affect, pt is alert and Ox3      DATA REVIEWED:  Lab Results  Component Value Date   HGBA1C 9.8 (H) 02/12/2020   Lab Results  Component Value Date   LDLCALC 97 01/28/2020   CREATININE 0.65  11/28/2019   Lab Results  Component Value Date   MICRALBCREAT 11 02/12/2020    Lab Results  Component Value Date   CHOL 158 01/28/2020   HDL 43 01/28/2020   LDLCALC 97 01/28/2020   TRIG 100 01/28/2020   CHOLHDL 3.7 01/28/2020        ASSESSMENT / PLAN / RECOMMENDATIONS:   1) Type 2 Diabetes Mellitus, Poorly controlled, Without  complications - Most recent A1c of 9.8 %. Goal A1c <  7.0%.    Plan: GENERAL: I have discussed with the patient the pathophysiology of diabetes. We went over the natural progression of the disease. We talked about both insulin resistance and insulin deficiency. We stressed the importance of lifestyle changes including diet and exercise. I explained the complications associated with diabetes including retinopathy, nephropathy, neuropathy as well as increased risk of cardiovascular disease. We went over the benefit seen with glycemic control.    I explained to the patient that diabetic patients are at higher than normal risk for amputations.   She has done dietary modifications, I have offered her a referral to our RD  In-Office BG 188 mg./dL after drinking coffee without sugar , I have recommended add on therapy , but she would like to hold off as her metformin dose was recently increased and her BG's are trending down   We discussed fasting glucose goal 70-130 mg/dL, and during day goal is < 180 mg/dL , I explained to her current BG of 188 mg/dL is high for a fasting goal   MEDICATIONS:  Continue Metformin 1000 mg BID   EDUCATION / INSTRUCTIONS:  BG monitoring instructions: Patient is instructed to check her blood sugars 1 times a day, fasting .  Call [facility] if: BG persistently < 70  . I reviewed the Rule of 15 for the treatment of hypoglycemia in detail with the patient. Literature supplied.   2) Diabetic complications:   Eye: Does not have known diabetic retinopathy.   Neuro/ Feet: Does not have known diabetic peripheral  neuropathy.  Renal: Patient does not have known baseline CKD. She is on an ACEI/ARB at present.Marland Kitchen   3) Hypothyroidism:  -  I have advised the patient for my preference to go with Levothyroxine rather then armour thyroid, due to more stability with T4 content in levothyroxine and also armour thyroid has non-physiologic levels of T4:T3 of  4:1 (physiologic levels 13:1 to 16:1) - She would like to remain on Armour thyroid at this time  - Discussed importance of compliance with armour intake, we also discussed risk of iatrogenic hyperthyroidism to include cardiac arrhythmia and  Increased bone resorption    Medication  Stop Armour thyroid 15 mg  Continue armour thyroid 180 mg daily    4) Cushingoid features:  - Given weight gain , body habitus with fat deposition around the base of neck and abdomen as well as DM and abdominal striae, will proceed with 24- hr urine collection of cortisol      F/U in 3 months   Signed electronically by: Mack Guise, MD  [facility]  [facility] [address] [place], [place] [address] Phone: [phone] FAX: [phone]   CC: Wendie Agreste, [place] [address] Phone: [phone]  Fax: [phone]    Return to Endocrinology clinic as below: No future appointments.

## 2020-04-05 NOTE — Patient Instructions (Addendum)
-   Continue Metformin 1000 mg, 1 tablet with Breakfast and Supper  - STOP Armour thyroid 15 mg  - Continue Armour thyroid 180 mg daily        Choose healthy, lower carb lower calorie snacks: toss salad, cooked vegetables, cottage cheese, peanut butter, low fat cheese / string cheese, lower sodium deli meat, tuna salad or chicken salad  24-Hour Urine Collection   You will be collecting your urine for a 24-hour period of time.  Your timer starts with your first urine of the morning (For example - If you first pee at [place], your timer will start at [place])  [place] away your first urine of the morning  Collect your urine every time you pee for the next 24 hours STOP your urine collection 24 hours after you started the collection (For example - You would stop at 9AM the day after you started)

## 2020-04-05 NOTE — Addendum Note (Signed)
Addended by: Lauralyn Primes on: 04/05/2020 01:07 PM   Modules accepted: Orders

## 2020-04-12 ENCOUNTER — Other Ambulatory Visit: Payer: Commercial Managed Care - PPO

## 2020-04-12 ENCOUNTER — Other Ambulatory Visit: Payer: Self-pay

## 2020-04-12 DIAGNOSIS — R635 Abnormal weight gain: Secondary | ICD-10-CM

## 2020-04-13 ENCOUNTER — Encounter: Payer: Self-pay | Admitting: Family Medicine

## 2020-04-13 DIAGNOSIS — J0110 Acute frontal sinusitis, unspecified: Secondary | ICD-10-CM

## 2020-04-13 DIAGNOSIS — H9190 Unspecified hearing loss, unspecified ear: Secondary | ICD-10-CM

## 2020-04-16 ENCOUNTER — Encounter: Payer: Self-pay | Admitting: Internal Medicine

## 2020-04-19 LAB — CORTISOL, URINE, 24 HOUR
24 Hour urine volume (VMAHVA): 1800 mL
CREATININE, URINE: 1.55 g/(24.h) (ref 0.50–2.15)
Cortisol (Ur), Free: 9.3 mcg/24 h (ref 4.0–50.0)

## 2020-04-28 ENCOUNTER — Encounter: Payer: Self-pay | Admitting: Registered Nurse

## 2020-04-28 ENCOUNTER — Telehealth (INDEPENDENT_AMBULATORY_CARE_PROVIDER_SITE_OTHER): Payer: Commercial Managed Care - PPO | Admitting: Registered Nurse

## 2020-04-28 ENCOUNTER — Ambulatory Visit (HOSPITAL_BASED_OUTPATIENT_CLINIC_OR_DEPARTMENT_OTHER)
Admission: RE | Admit: 2020-04-28 | Discharge: 2020-04-28 | Disposition: A | Discharge status: Home / Self Care | Payer: Commercial Managed Care - PPO | Source: Ambulatory Visit | Attending: Registered Nurse | Admitting: Registered Nurse

## 2020-04-28 ENCOUNTER — Other Ambulatory Visit: Payer: Self-pay

## 2020-04-28 DIAGNOSIS — K591 Functional diarrhea: Secondary | ICD-10-CM

## 2020-04-28 DIAGNOSIS — R1011 Right upper quadrant pain: Secondary | ICD-10-CM | POA: Diagnosis not present

## 2020-04-28 DIAGNOSIS — R198 Other specified symptoms and signs involving the digestive system and abdomen: Secondary | ICD-10-CM | POA: Insufficient documentation

## 2020-04-28 DIAGNOSIS — K8000 Calculus of gallbladder with acute cholecystitis without obstruction: Secondary | ICD-10-CM

## 2020-04-28 MED ORDER — ONDANSETRON HCL 4 MG PO TABS: 4.0000 mg | ORAL_TABLET | Freq: Three times a day (TID) | ORAL | 0 refills | Status: DC | PRN

## 2020-04-28 MED ORDER — TRAMADOL HCL 50 MG PO TABS: 50.0000 mg | ORAL_TABLET | Freq: Three times a day (TID) | ORAL | 0 refills | Status: AC | PRN

## 2020-04-28 NOTE — Progress Notes (Signed)
Telemedicine Encounter- SOAP NOTE Established Patient  This telephone encounter was conducted with the patient's (or proxy's) verbal consent via audio telecommunications: yes  Patient was instructed to have this encounter in a suitably private space; and to only have persons present to whom they give permission to participate. In addition, patient identity was confirmed by use of name plus two identifiers (DOB and address).  I discussed the limitations, risks, security and privacy concerns of performing an evaluation and management service by telephone and the availability of in person appointments. I also discussed with the patient that there may be a patient responsible charge related to this service. The patient expressed understanding and agreed to proceed.  I spent a total of 18 minutes talking with the patient or their proxy.  Patient at home Provider in office  Chief Complaint  Patient presents with   Diarrhea    Pt reports she has had diarrhea and stomach cramps for 2 weeks reports she cant even hold this     Subjective   Teresa Castaneda is a 50 y.o. established patient. Telephone visit today for nausea and diarrhea  HPI Ongoing 2 weeks First week was intermittent This week has been consistent, some fecal incontinence, some abdominal pain occurring. Epigastric and RUQ pain of cramping quality.  Notes that yesterday had sausage biscuit for breakfast, could only eat half due to pain in RUQ. When stepping out of her vehicle had an episode of fecal incontinence with diarrhea. No blood or mucus in stool. Feels weak and dehydrated. No vomiting. No headache. No visual changes, chest pain, breathing issues.   Of note, cholelithiases noted on imaging in Aug 2021.  Patient Active Problem List   Diagnosis Date Noted   Acquired hypothyroidism 04/05/2020   Weight gain 04/05/2020   Type 2 diabetes mellitus with hyperglycemia, without long-term current use of insulin ([place])  04/05/2020   History of COVID-19 09/23/2019   Pneumonia due to COVID-19 virus 09/23/2019   Cough 09/23/2019   BMI 40.0-44.9, adult ([place]) 07/22/2019   Facet arthropathy, lumbar 07/22/2019   Sacroiliitis ([place]) 07/22/2019   ANXIETY 09/10/2006   DEPRESSION 09/10/2006   ASTHMA 09/10/2006   HEADACHE 09/10/2006    Past Medical History:  Diagnosis Date   Allergies    Arthritis    Asthma    Back pain    Spinal Issue    DJD (degenerative joint disease), lumbar     Current Outpatient Medications  Medication Sig Dispense Refill   albuterol (VENTOLIN HFA) 108 (90 Base) MCG/ACT inhaler Inhale 2 puffs into the lungs every 6 (six) hours as needed. For shortness of breath and wheezing 18 g 0   ARMOUR THYROID 180 MG tablet Take 180 mg by mouth daily.     atorvastatin (LIPITOR) 10 MG tablet Take 1 tablet (10 mg total) by mouth daily. 90 tablet 3   Azelastine-Fluticasone (DYMISTA) 137-50 MCG/ACT SUSP Place 1 spray into the nose in the morning and at bedtime. 23 g 6   escitalopram (LEXAPRO) 10 MG tablet Take 1 tablet (10 mg total) by mouth daily. 30 tablet 2   Fluticasone-Umeclidin-Vilant (TRELEGY ELLIPTA) 100-62.5-25 MCG/INH AEPB Inhale 1 puff into the lungs daily. 1 each 6   furosemide (LASIX) 20 MG tablet Take 0.5 tablets (10 mg total) by mouth daily. 15 tablet 0   ibuprofen (ADVIL) 600 MG tablet Take 1 tablet (600 mg total) by mouth every 6 (six) hours as needed. 30 tablet 0   levocetirizine (XYZAL) 5 MG tablet  Take 5 mg by mouth every evening.     LORazepam (ATIVAN) 1 MG tablet Take 1 mg by mouth every 8 (eight) hours as needed for anxiety.     metFORMIN (GLUCOPHAGE) 1000 MG tablet Take 1 tablet (1,000 mg total) by mouth 2 (two) times daily with a meal. 180 tablet 3   montelukast (SINGULAIR) 10 MG tablet Take 10 mg by mouth at bedtime.     omeprazole (PRILOSEC) 40 MG capsule Take 40 mg by mouth daily.     ondansetron (ZOFRAN) 4 MG tablet Take 1 tablet (4 mg total) by mouth every 8 (eight) hours  as needed for nausea or vomiting. 20 tablet 0   pseudoephedrine (SUDAFED 12 HOUR) 120 MG 12 hr tablet Take 1 tablet (120 mg total) by mouth 2 (two) times daily. 40 tablet 0   thyroid (ARMOUR THYROID) 15 MG tablet Take 1 tablet (15 mg total) by mouth daily. For total dose 144m qd. 30 tablet 2   traMADol (ULTRAM) 50 MG tablet Take 1 tablet (50 mg total) by mouth every 8 (eight) hours as needed for up to 5 days. 15 tablet 0   No current facility-administered medications for this visit.    Allergies  Allergen Reactions   Clarithromycin Other (See Comments)    Body cramps    Codeine Nausea And Vomiting    Social History   Socioeconomic History   Marital status: Single    Spouse name: Not on file   Number of children: Not on file   Years of education: Not on file   Highest education level: Not on file  Occupational History   Not on file  Tobacco Use   Smoking status: Former Smoker    Packs/day: 0.50    Types: Cigarettes    Start date: 01/16/1985    Quit date: 10/08/2019    Years since quitting: 0.5   Smokeless tobacco: Never Used  Substance and Sexual Activity   Alcohol use: No   Drug use: No   Sexual activity: Not on file  Other Topics Concern   Not on file  Social History Narrative   Not on file   Social Determinants of Health   Financial Resource Strain: Not on file  Food Insecurity: Not on file  Transportation Needs: Not on file  Physical Activity: Not on file  Stress: Not on file  Social Connections: Not on file  Intimate Partner Violence: Not on file    Review of Systems  Constitutional: Positive for malaise/fatigue. Negative for fever (pt declines but has not taken her temp).  HENT: Negative.   Eyes: Negative.   Respiratory: Negative.   Cardiovascular: Negative.   Gastrointestinal: Positive for abdominal pain (tenderness, pt reports positive murphy's sign.), diarrhea and nausea.  Genitourinary: Negative.   Musculoskeletal: Negative.   Skin: Negative.    Neurological: Negative.   Endo/Heme/Allergies: Negative.   Psychiatric/Behavioral: Negative.   All other systems reviewed and are negative.   Objective   Vitals as reported by the patient: There were no vitals filed for this visit.  LTaneilwas seen today for diarrhea.  Diagnoses and all orders for this visit:  RUQ abdominal pain -     ondansetron (ZOFRAN) 4 MG tablet; Take 1 tablet (4 mg total) by mouth every 8 (eight) hours as needed for nausea or vomiting. -     traMADol (ULTRAM) 50 MG tablet; Take 1 tablet (50 mg total) by mouth every 8 (eight) hours as needed for up to 5 days. -  Cancel: US Abdomen Limited RUQ (LIVER/GB); Future -     US Abdomen Limited RUQ (LIVER/GB); Future  Functional diarrhea -     ondansetron (ZOFRAN) 4 MG tablet; Take 1 tablet (4 mg total) by mouth every 8 (eight) hours as needed for nausea or vomiting. -     traMADol (ULTRAM) 50 MG tablet; Take 1 tablet (50 mg total) by mouth every 8 (eight) hours as needed for up to 5 days. -     Cancel: US Abdomen Limited RUQ (LIVER/GB); Future -     US Abdomen Limited RUQ (LIVER/GB); Future  Positive Murphy's Sign -     Cancel: US Abdomen Limited RUQ (LIVER/GB); Future -     US Abdomen Limited RUQ (LIVER/GB); Future  Calculus of gallbladder with acute cholecystitis without obstruction -     Cancel: US Abdomen Limited RUQ (LIVER/GB); Future -     US Abdomen Limited RUQ (LIVER/GB); Future   PLAN zofran and tramadol for nausea and pain. Recommend clear liquids only, ideally just water. BRAT diet if needed. Concern for cholecystitis, would like her to be npo if possible incase cholecystectomy needs to happen. Pt voices understanding Stat US RUQ to rule out cholecystitis, mirizzi syndrome, porcelain gallbladder. Less suspicious for malignancy or hepatic abnormality.  Will follow up when results are in. Ideally pt to be seen at [facility] in order to also have labs collected: CBC w/ diff and platelets, CMP, TSH.  And Vital signs collected, in particular checking her temp. Patient encouraged to call clinic with any questions, comments, or concerns.  I discussed the assessment and treatment plan with the patient. The patient was provided an opportunity to ask questions and all were answered. The patient agreed with the plan and demonstrated an understanding of the instructions.   The patient was advised to call back or seek an in-person evaluation if the symptoms worsen or if the condition fails to improve as anticipated.  I provided 18 minutes of non-face-to-face time during this encounter.  Maximiano Coss, NP

## 2020-04-29 NOTE — Telephone Encounter (Signed)
Patient would like to know if you can get a referral with the doctor listed below. Patient also would like to know the results from her ultrasound.

## 2020-05-04 ENCOUNTER — Other Ambulatory Visit (INDEPENDENT_AMBULATORY_CARE_PROVIDER_SITE_OTHER): Payer: Commercial Managed Care - PPO

## 2020-05-04 ENCOUNTER — Other Ambulatory Visit: Payer: Self-pay | Admitting: Registered Nurse

## 2020-05-04 ENCOUNTER — Encounter: Payer: Self-pay | Admitting: Registered Nurse

## 2020-05-04 ENCOUNTER — Other Ambulatory Visit: Payer: Self-pay

## 2020-05-04 DIAGNOSIS — K591 Functional diarrhea: Secondary | ICD-10-CM

## 2020-05-04 DIAGNOSIS — K8000 Calculus of gallbladder with acute cholecystitis without obstruction: Secondary | ICD-10-CM | POA: Diagnosis not present

## 2020-05-04 DIAGNOSIS — R1011 Right upper quadrant pain: Secondary | ICD-10-CM

## 2020-05-04 DIAGNOSIS — R198 Other specified symptoms and signs involving the digestive system and abdomen: Secondary | ICD-10-CM

## 2020-05-04 LAB — CBC WITH DIFFERENTIAL/PLATELET
Basophils Absolute: 0 10*3/uL (ref 0.0–0.1)
Basophils Relative: 0.6 % (ref 0.0–3.0)
Eosinophils Absolute: 0.3 10*3/uL (ref 0.0–0.7)
Eosinophils Relative: 5.2 % — ABNORMAL HIGH (ref 0.0–5.0)
HCT: 40.1 % (ref 36.0–46.0)
Hemoglobin: 13.3 g/dL (ref 12.0–15.0)
Lymphocytes Relative: 37.4 % (ref 12.0–46.0)
Lymphs Abs: 2.1 10*3/uL (ref 0.7–4.0)
MCHC: 33.2 g/dL (ref 30.0–36.0)
MCV: 85.7 fl (ref 78.0–100.0)
Monocytes Absolute: 0.4 10*3/uL (ref 0.1–1.0)
Monocytes Relative: 6.7 % (ref 3.0–12.0)
Neutro Abs: 2.8 10*3/uL (ref 1.4–7.7)
Neutrophils Relative %: 50.1 % (ref 43.0–77.0)
Platelets: 123 10*3/uL — ABNORMAL LOW (ref 150.0–400.0)
RBC: 4.68 Mil/uL (ref 3.87–5.11)
RDW: 14.1 % (ref 11.5–15.5)
WBC: 5.7 10*3/uL (ref 4.0–10.5)

## 2020-05-04 LAB — COMPREHENSIVE METABOLIC PANEL
ALT: 39 U/L — ABNORMAL HIGH (ref 0–35)
AST: 47 U/L — ABNORMAL HIGH (ref 0–37)
Albumin: 3.2 g/dL — ABNORMAL LOW (ref 3.5–5.2)
Alkaline Phosphatase: 83 U/L (ref 39–117)
BUN: 7 mg/dL (ref 6–23)
CO2: 26 mEq/L (ref 19–32)
Calcium: 9 mg/dL (ref 8.4–10.5)
Chloride: 101 mEq/L (ref 96–112)
Creatinine, Ser: 0.5 mg/dL (ref 0.40–1.20)
GFR: 110.9 mL/min (ref >60.00)
Glucose, Bld: 237 mg/dL — ABNORMAL HIGH (ref 70–99)
Potassium: 4.5 mEq/L (ref 3.5–5.1)
Sodium: 133 mEq/L — ABNORMAL LOW (ref 135–145)
Total Bilirubin: 0.8 mg/dL (ref 0.2–1.2)
Total Protein: 7.1 g/dL (ref 6.0–8.3)

## 2020-05-04 LAB — TSH: TSH: <0.01 u[IU]/mL — ABNORMAL LOW (ref 0.35–4.50)

## 2020-05-06 ENCOUNTER — Other Ambulatory Visit: Payer: Self-pay

## 2020-05-06 ENCOUNTER — Other Ambulatory Visit: Payer: Self-pay | Admitting: Registered Nurse

## 2020-05-06 ENCOUNTER — Encounter (INDEPENDENT_AMBULATORY_CARE_PROVIDER_SITE_OTHER): Payer: Self-pay | Admitting: Otolaryngology

## 2020-05-06 ENCOUNTER — Ambulatory Visit (INDEPENDENT_AMBULATORY_CARE_PROVIDER_SITE_OTHER): Payer: Commercial Managed Care - PPO | Admitting: Otolaryngology

## 2020-05-06 VITALS — Temp 96.4°F

## 2020-05-06 DIAGNOSIS — K591 Functional diarrhea: Secondary | ICD-10-CM

## 2020-05-06 DIAGNOSIS — R1011 Right upper quadrant pain: Secondary | ICD-10-CM

## 2020-05-06 DIAGNOSIS — H903 Sensorineural hearing loss, bilateral: Secondary | ICD-10-CM | POA: Diagnosis not present

## 2020-05-06 DIAGNOSIS — J342 Deviated nasal septum: Secondary | ICD-10-CM | POA: Diagnosis not present

## 2020-05-06 DIAGNOSIS — J322 Chronic ethmoidal sinusitis: Secondary | ICD-10-CM

## 2020-05-06 DIAGNOSIS — J310 Chronic rhinitis: Secondary | ICD-10-CM | POA: Diagnosis not present

## 2020-05-06 NOTE — Progress Notes (Signed)
HPI: Teresa Castaneda is a 50 y.o. female who presents is referred by Dr. Carlota Raspberry for evaluation of chronic sinus issues as well as nasal obstruction.  She also reports that she has had fluid in her ears with itching and decreased hearing.  She states that she has a lot of pressure in her head and pressure in her sinuses.  She can never breathe well through her nose as her nose is mostly congested.  She has been using Nasacort regularly as well as saline rinses.  She also takes Sudafed on a regular basis. She stopped smoking last year. She has history of allergy and asthma..  Past Medical History:  Diagnosis Date  . Allergies   . Arthritis   . Asthma   . Back pain    Spinal Issue   . DJD (degenerative joint disease), lumbar    Past Surgical History:  Procedure Laterality Date  . ABDOMINAL HYSTERECTOMY     Social History   Socioeconomic History  . Marital status: Single    Spouse name: Not on file  . Number of children: Not on file  . Years of education: Not on file  . Highest education level: Not on file  Occupational History  . Not on file  Tobacco Use  . Smoking status: Former Smoker    Packs/day: 0.50    Types: Cigarettes    Start date: 01/16/1985    Quit date: 10/08/2019    Years since quitting: 0.5  . Smokeless tobacco: Never Used  Substance and Sexual Activity  . Alcohol use: No  . Drug use: No  . Sexual activity: Not on file  Other Topics Concern  . Not on file  Social History Narrative  . Not on file   Social Determinants of Health   Financial Resource Strain: Not on file  Food Insecurity: Not on file  Transportation Needs: Not on file  Physical Activity: Not on file  Stress: Not on file  Social Connections: Not on file   Family History  Problem Relation Age of Onset  . Coronary artery disease Mother   . Diabetes Mother   . COPD Mother   . Coronary artery disease Father   . Hypertension Father    Allergies  Allergen Reactions  . Clarithromycin Other  (See Comments)    Body cramps   . Codeine Nausea And Vomiting   Prior to Admission medications   Medication Sig Start Date End Date Taking? Authorizing Provider  albuterol (VENTOLIN HFA) 108 (90 Base) MCG/ACT inhaler Inhale 2 puffs into the lungs every 6 (six) hours as needed. For shortness of breath and wheezing 09/01/19   Ok Edwards, PA-C  ARMOUR THYROID 180 MG tablet Take 180 mg by mouth daily. 09/07/19   [provider]  atorvastatin (LIPITOR) 10 MG tablet Take 1 tablet (10 mg total) by mouth daily. 12/17/19   Jettie Booze, MD  Azelastine-Fluticasone [facility]) 137-50 MCG/ACT SUSP Place 1 spray into the nose in the morning and at bedtime. 03/10/20   Just, Laurita Quint, FNP  escitalopram (LEXAPRO) 10 MG tablet Take 1 tablet (10 mg total) by mouth daily. 02/12/20   Wendie Agreste, MD  Fluticasone-Umeclidin-Vilant (TRELEGY ELLIPTA) 100-62.5-25 MCG/INH AEPB Inhale 1 puff into the lungs daily. 10/13/19   Freddi Starr, MD  furosemide (LASIX) 20 MG tablet Take 0.5 tablets (10 mg total) by mouth daily. 11/17/19   Freddi Starr, MD  ibuprofen (ADVIL) 600 MG tablet Take 1 tablet (600 mg total) by  mouth every 6 (six) hours as needed. 12/26/19   Lamptey, Myrene Galas, MD  levocetirizine (XYZAL) 5 MG tablet Take 5 mg by mouth every evening.    [provider]  LORazepam (ATIVAN) 1 MG tablet Take 1 mg by mouth every 8 (eight) hours as needed for anxiety.    [provider]  metFORMIN (GLUCOPHAGE) 1000 MG tablet Take 1 tablet (1,000 mg total) by mouth 2 (two) times daily with a meal. 03/25/20   Wendie Agreste, MD  montelukast (SINGULAIR) 10 MG tablet Take 10 mg by mouth at bedtime.    [provider]  omeprazole (PRILOSEC) 40 MG capsule Take 40 mg by mouth daily. 09/25/19   [provider]  ondansetron (ZOFRAN) 4 MG tablet Take 1 tablet (4 mg total) by mouth every 8 (eight) hours as needed for nausea or vomiting. 04/28/20   Maximiano Coss, NP   pseudoephedrine (SUDAFED 12 HOUR) 120 MG 12 hr tablet Take 1 tablet (120 mg total) by mouth 2 (two) times daily. 03/10/20   Just, Laurita Quint, FNP  thyroid (ARMOUR THYROID) 15 MG tablet Take 1 tablet (15 mg total) by mouth daily. For total dose 170m qd. 02/14/20   GWendie Agreste MD  azelastine (ASTELIN) 0.1 % nasal spray Place 2 sprays into both nostrils 2 (two) times daily. 04/23/19 12/26/19  YTasia Catchings Amy V, PA-C     Positive ROS: Otherwise negative  All other systems have been reviewed and were otherwise negative with the exception of those mentioned in the HPI and as above.  Physical Exam: Constitutional: Alert, well-appearing, no acute distress Ears: External ears without lesions or tenderness. Ear canals are clear bilaterally.  Both TMs have some sclerotic changes but appear clear otherwise with no obvious middle ear effusion noted.  On hearing screening with the 512 1024 tuning fork she hears about the same in both ears but has diminished hearing in both ears with AC > BC bilaterally. Nasal: External nose without lesions. Septum mildly deviated to the right.  She has diffuse swelling of the mucous membranes in the nose..  On nasal endoscopy the left middle meatus region is fairly clear.  No polyps noted.  No mucopurulent discharge noted.  On the right side she has a little bit of thick mucus discharge from the posterior ethmoid area.  No polyps were noted.  Nasopharynx was otherwise clear.  Eustachian tubes are unobstructed. Oral: Lips and gums without lesions. Tongue and palate mucosa without lesions. Posterior oropharynx clear. Neck: No palpable adenopathy or masses Respiratory: Breathing comfortably  Skin: No facial/neck lesions or rash noted.  Procedures  Assessment: Septal deviation with chronic rhinitis. History of "chronic sinus blockage" according to patient. Sensorineural hearing loss. Mild sinusitis on nasal endoscopy in the office today.  Plan: Recommended continue use of the  Nasacort and saline rinses for the nasal symptoms. Placed her on Augmentin 875 mg twice daily for 2weeks. We will plan on scheduling a CT scan of her sinuses in 3 to 4 weeks and have her follow-up in 5 to 6 weeks for recheck and review the CT scan. We will also schedule audiologic testing on follow-up in 5 to 6 weeks.   CRadene Journey MD   CC:

## 2020-05-10 ENCOUNTER — Emergency Department (HOSPITAL_BASED_OUTPATIENT_CLINIC_OR_DEPARTMENT_OTHER): Payer: Commercial Managed Care - PPO

## 2020-05-10 ENCOUNTER — Observation Stay (HOSPITAL_COMMUNITY): Payer: Commercial Managed Care - PPO

## 2020-05-10 ENCOUNTER — Encounter (HOSPITAL_BASED_OUTPATIENT_CLINIC_OR_DEPARTMENT_OTHER): Payer: Self-pay | Admitting: Emergency Medicine

## 2020-05-10 ENCOUNTER — Observation Stay (HOSPITAL_COMMUNITY): Payer: Commercial Managed Care - PPO | Admitting: Registered Nurse

## 2020-05-10 ENCOUNTER — Other Ambulatory Visit: Payer: Self-pay

## 2020-05-10 ENCOUNTER — Observation Stay (HOSPITAL_BASED_OUTPATIENT_CLINIC_OR_DEPARTMENT_OTHER)
Admission: EM | Admit: 2020-05-10 | Discharge: 2020-05-10 | Disposition: A | Discharge status: Home / Self Care | Payer: Commercial Managed Care - PPO | Attending: Surgery | Admitting: Surgery

## 2020-05-10 ENCOUNTER — Encounter (HOSPITAL_COMMUNITY)
Admission: EM | Disposition: A | Discharge status: Home / Self Care | Payer: Self-pay | Source: Home / Self Care | Attending: Emergency Medicine

## 2020-05-10 DIAGNOSIS — Z87891 Personal history of nicotine dependence: Secondary | ICD-10-CM | POA: Diagnosis not present

## 2020-05-10 DIAGNOSIS — J45909 Unspecified asthma, uncomplicated: Secondary | ICD-10-CM | POA: Insufficient documentation

## 2020-05-10 DIAGNOSIS — Z79899 Other long term (current) drug therapy: Secondary | ICD-10-CM | POA: Insufficient documentation

## 2020-05-10 DIAGNOSIS — Z419 Encounter for procedure for purposes other than remedying health state, unspecified: Secondary | ICD-10-CM

## 2020-05-10 DIAGNOSIS — Z8616 Personal history of COVID-19: Secondary | ICD-10-CM | POA: Insufficient documentation

## 2020-05-10 DIAGNOSIS — E039 Hypothyroidism, unspecified: Secondary | ICD-10-CM | POA: Insufficient documentation

## 2020-05-10 DIAGNOSIS — E119 Type 2 diabetes mellitus without complications: Secondary | ICD-10-CM | POA: Insufficient documentation

## 2020-05-10 DIAGNOSIS — R1011 Right upper quadrant pain: Secondary | ICD-10-CM

## 2020-05-10 DIAGNOSIS — R197 Diarrhea, unspecified: Secondary | ICD-10-CM

## 2020-05-10 DIAGNOSIS — K7460 Unspecified cirrhosis of liver: Secondary | ICD-10-CM | POA: Diagnosis not present

## 2020-05-10 DIAGNOSIS — K8010 Calculus of gallbladder with chronic cholecystitis without obstruction: Principal | ICD-10-CM | POA: Insufficient documentation

## 2020-05-10 DIAGNOSIS — K8020 Calculus of gallbladder without cholecystitis without obstruction: Secondary | ICD-10-CM | POA: Diagnosis present

## 2020-05-10 DIAGNOSIS — Z20822 Contact with and (suspected) exposure to covid-19: Secondary | ICD-10-CM | POA: Insufficient documentation

## 2020-05-10 DIAGNOSIS — Z7984 Long term (current) use of oral hypoglycemic drugs: Secondary | ICD-10-CM | POA: Diagnosis not present

## 2020-05-10 HISTORY — DX: Type 2 diabetes mellitus without complications: E11.9

## 2020-05-10 HISTORY — PX: CHOLECYSTECTOMY: SHX55

## 2020-05-10 HISTORY — DX: Disorder of thyroid, unspecified: E07.9

## 2020-05-10 LAB — RESP PANEL BY RT-PCR (FLU A&B, COVID) ARPGX2
Influenza A by PCR: NEGATIVE
Influenza B by PCR: NEGATIVE
SARS Coronavirus 2 by RT PCR: NEGATIVE

## 2020-05-10 LAB — COMPREHENSIVE METABOLIC PANEL
ALT: 41 U/L (ref 0–44)
AST: 47 U/L — ABNORMAL HIGH (ref 15–41)
Albumin: 3.2 g/dL — ABNORMAL LOW (ref 3.5–5.0)
Alkaline Phosphatase: 82 U/L (ref 38–126)
Anion gap: 10 (ref 5–15)
BUN: 8 mg/dL (ref 6–20)
CO2: 24 mmol/L (ref 22–32)
Calcium: 9 mg/dL (ref 8.9–10.3)
Chloride: 101 mmol/L (ref 98–111)
Creatinine, Ser: 0.51 mg/dL (ref 0.44–1.00)
GFR, Estimated: >60 mL/min (ref >60)
Glucose, Bld: 216 mg/dL — ABNORMAL HIGH (ref 70–99)
Potassium: 3.9 mmol/L (ref 3.5–5.1)
Sodium: 135 mmol/L (ref 135–145)
Total Bilirubin: 0.8 mg/dL (ref 0.3–1.2)
Total Protein: 7.4 g/dL (ref 6.5–8.1)

## 2020-05-10 LAB — CBC WITH DIFFERENTIAL/PLATELET
Abs Immature Granulocytes: 0.01 10*3/uL (ref 0.00–0.07)
Basophils Absolute: 0 10*3/uL (ref 0.0–0.1)
Basophils Relative: 1 %
Eosinophils Absolute: 0.3 10*3/uL (ref 0.0–0.5)
Eosinophils Relative: 5 %
HCT: 41.3 % (ref 36.0–46.0)
Hemoglobin: 13.7 g/dL (ref 12.0–15.0)
Immature Granulocytes: 0 %
Lymphocytes Relative: 37 %
Lymphs Abs: 2.4 10*3/uL (ref 0.7–4.0)
MCH: 28.4 pg (ref 26.0–34.0)
MCHC: 33.2 g/dL (ref 30.0–36.0)
MCV: 85.5 fL (ref 80.0–100.0)
Monocytes Absolute: 0.4 10*3/uL (ref 0.1–1.0)
Monocytes Relative: 6 %
Neutro Abs: 3.3 10*3/uL (ref 1.7–7.7)
Neutrophils Relative %: 51 %
Platelets: 126 10*3/uL — ABNORMAL LOW (ref 150–400)
RBC: 4.83 MIL/uL (ref 3.87–5.11)
RDW: 13.2 % (ref 11.5–15.5)
WBC: 6.5 10*3/uL (ref 4.0–10.5)
nRBC: 0 % (ref 0.0–0.2)

## 2020-05-10 LAB — LIPASE, BLOOD: Lipase: 43 U/L (ref 11–51)

## 2020-05-10 LAB — URINALYSIS, ROUTINE W REFLEX MICROSCOPIC
Bilirubin Urine: NEGATIVE
Glucose, UA: NEGATIVE mg/dL
Hgb urine dipstick: NEGATIVE
Ketones, ur: NEGATIVE mg/dL
Leukocytes,Ua: NEGATIVE
Nitrite: NEGATIVE
Protein, ur: NEGATIVE mg/dL
Specific Gravity, Urine: 1.02 (ref 1.005–1.030)
pH: 6 (ref 5.0–8.0)

## 2020-05-10 LAB — CBG MONITORING, ED: Glucose-Capillary: 144 mg/dL — ABNORMAL HIGH (ref 70–99)

## 2020-05-10 SURGERY — LAPAROSCOPIC CHOLECYSTECTOMY WITH INTRAOPERATIVE CHOLANGIOGRAM
Anesthesia: General | Site: Abdomen

## 2020-05-10 MED ORDER — BUPIVACAINE-EPINEPHRINE 0.25% -1:200000 IJ SOLN
INTRAMUSCULAR | Status: AC
  Filled 2020-05-10: qty 1

## 2020-05-10 MED ORDER — INSULIN LISPRO 100 UNIT/ML ~~LOC~~ SOLN: 5.0000 [IU] | Freq: Once | SUBCUTANEOUS | Status: DC

## 2020-05-10 MED ORDER — SIMETHICONE 80 MG PO CHEW
40.0000 mg | CHEWABLE_TABLET | Freq: Four times a day (QID) | ORAL | Status: DC | PRN
  Filled 2020-05-10: qty 1

## 2020-05-10 MED ORDER — OXYCODONE HCL 5 MG/5ML PO SOLN: 5.0000 mg | Freq: Once | ORAL | Status: AC | PRN

## 2020-05-10 MED ORDER — ACETAMINOPHEN 160 MG/5ML PO SOLN: 325.0000 mg | ORAL | Status: DC | PRN

## 2020-05-10 MED ORDER — ENOXAPARIN SODIUM 40 MG/0.4ML ~~LOC~~ SOLN: 40.0000 mg | SUBCUTANEOUS | Status: DC

## 2020-05-10 MED ORDER — LACTATED RINGERS IV SOLN
INTRAVENOUS | Status: DC | PRN
  Administered 2020-05-10: 1000 mL

## 2020-05-10 MED ORDER — AZELASTINE-FLUTICASONE 137-50 MCG/ACT NA SUSP: 1.0000 | Freq: Two times a day (BID) | NASAL | Status: DC

## 2020-05-10 MED ORDER — SCOPOLAMINE 1 MG/3DAYS TD PT72: 1.0000 | MEDICATED_PATCH | TRANSDERMAL | Status: DC

## 2020-05-10 MED ORDER — SUGAMMADEX SODIUM 200 MG/2ML IV SOLN
INTRAVENOUS | Status: DC | PRN
  Administered 2020-05-10: 300 mg via INTRAVENOUS

## 2020-05-10 MED ORDER — SODIUM CHLORIDE 0.9 % IV SOLN
INTRAVENOUS | Status: DC | PRN
  Administered 2020-05-10: 20 mL

## 2020-05-10 MED ORDER — LIDOCAINE 2% (20 MG/ML) 5 ML SYRINGE
INTRAMUSCULAR | Status: DC | PRN
  Administered 2020-05-10: 60 mg via INTRAVENOUS

## 2020-05-10 MED ORDER — ACETAMINOPHEN 650 MG RE SUPP: 650.0000 mg | Freq: Four times a day (QID) | RECTAL | Status: DC | PRN

## 2020-05-10 MED ORDER — ROCURONIUM BROMIDE 10 MG/ML (PF) SYRINGE
PREFILLED_SYRINGE | INTRAVENOUS | Status: AC
  Filled 2020-05-10: qty 10

## 2020-05-10 MED ORDER — LACTATED RINGERS IV SOLN: INTRAVENOUS | Status: DC | PRN

## 2020-05-10 MED ORDER — LIDOCAINE 2% (20 MG/ML) 5 ML SYRINGE
INTRAMUSCULAR | Status: AC
  Filled 2020-05-10: qty 5

## 2020-05-10 MED ORDER — DIPHENHYDRAMINE HCL 50 MG/ML IJ SOLN: 25.0000 mg | Freq: Four times a day (QID) | INTRAMUSCULAR | Status: DC | PRN

## 2020-05-10 MED ORDER — ONDANSETRON HCL 4 MG/2ML IJ SOLN
INTRAMUSCULAR | Status: DC | PRN
  Administered 2020-05-10: 4 mg via INTRAVENOUS

## 2020-05-10 MED ORDER — METOPROLOL TARTRATE 5 MG/5ML IV SOLN: 5.0000 mg | Freq: Four times a day (QID) | INTRAVENOUS | Status: DC | PRN

## 2020-05-10 MED ORDER — FENTANYL CITRATE (PF) 100 MCG/2ML IJ SOLN
INTRAMUSCULAR | Status: DC | PRN
  Administered 2020-05-10: 50 ug via INTRAVENOUS
  Administered 2020-05-10: 100 ug via INTRAVENOUS
  Administered 2020-05-10: 50 ug via INTRAVENOUS

## 2020-05-10 MED ORDER — PROPOFOL 10 MG/ML IV BOLUS
INTRAVENOUS | Status: DC | PRN
  Administered 2020-05-10: 200 mg via INTRAVENOUS

## 2020-05-10 MED ORDER — 0.9 % SODIUM CHLORIDE (POUR BTL) OPTIME
TOPICAL | Status: DC | PRN
  Administered 2020-05-10: 1000 mL

## 2020-05-10 MED ORDER — ACETAMINOPHEN 10 MG/ML IV SOLN: 1000.0000 mg | Freq: Once | INTRAVENOUS | Status: DC | PRN

## 2020-05-10 MED ORDER — DEXAMETHASONE SODIUM PHOSPHATE 10 MG/ML IJ SOLN
INTRAMUSCULAR | Status: DC | PRN
  Administered 2020-05-10: 4 mg via INTRAVENOUS

## 2020-05-10 MED ORDER — ONDANSETRON HCL 4 MG/2ML IJ SOLN
4.0000 mg | Freq: Once | INTRAMUSCULAR | Status: AC
  Administered 2020-05-10: 4 mg via INTRAVENOUS
  Filled 2020-05-10: qty 2

## 2020-05-10 MED ORDER — BUPIVACAINE-EPINEPHRINE 0.25% -1:200000 IJ SOLN
INTRAMUSCULAR | Status: DC | PRN
  Administered 2020-05-10: 50 mL

## 2020-05-10 MED ORDER — POLYETHYLENE GLYCOL 3350 17 G PO PACK: 17.0000 g | PACK | Freq: Every day | ORAL | Status: DC | PRN

## 2020-05-10 MED ORDER — ACETAMINOPHEN 500 MG PO TABS
1000.0000 mg | ORAL_TABLET | ORAL | Status: AC
  Administered 2020-05-10: 1000 mg via ORAL
  Filled 2020-05-10: qty 2

## 2020-05-10 MED ORDER — OXYCODONE HCL 5 MG PO TABS
5.0000 mg | ORAL_TABLET | Freq: Once | ORAL | Status: AC | PRN
Start: 2020-05-10 — End: 2020-05-10
  Administered 2020-05-10: 5 mg via ORAL

## 2020-05-10 MED ORDER — SCOPOLAMINE 1 MG/3DAYS TD PT72
MEDICATED_PATCH | TRANSDERMAL | Status: AC
  Administered 2020-05-10: 1.5 mg via TRANSDERMAL
  Filled 2020-05-10: qty 1

## 2020-05-10 MED ORDER — ALBUTEROL SULFATE HFA 108 (90 BASE) MCG/ACT IN AERS: 2.0000 | INHALATION_SPRAY | Freq: Four times a day (QID) | RESPIRATORY_TRACT | Status: DC | PRN

## 2020-05-10 MED ORDER — FENTANYL CITRATE (PF) 100 MCG/2ML IJ SOLN
25.0000 ug | INTRAMUSCULAR | Status: DC | PRN
Start: 2020-05-10 — End: 2020-05-11

## 2020-05-10 MED ORDER — SODIUM CHLORIDE 0.9 % IV BOLUS
500.0000 mL | Freq: Once | INTRAVENOUS | Status: AC
  Administered 2020-05-10: 500 mL via INTRAVENOUS

## 2020-05-10 MED ORDER — SODIUM CHLORIDE 0.9 % IV SOLN: INTRAVENOUS | Status: DC

## 2020-05-10 MED ORDER — ACETAMINOPHEN 325 MG PO TABS: 650.0000 mg | ORAL_TABLET | Freq: Four times a day (QID) | ORAL | Status: DC | PRN

## 2020-05-10 MED ORDER — METHOCARBAMOL 500 MG PO TABS: 500.0000 mg | ORAL_TABLET | Freq: Four times a day (QID) | ORAL | Status: DC | PRN

## 2020-05-10 MED ORDER — MIDAZOLAM HCL 5 MG/5ML IJ SOLN
INTRAMUSCULAR | Status: DC | PRN
  Administered 2020-05-10: 2 mg via INTRAVENOUS

## 2020-05-10 MED ORDER — SODIUM CHLORIDE 0.9 % IV SOLN
2.0000 g | INTRAVENOUS | Status: DC
  Administered 2020-05-10: 2 g via INTRAVENOUS
  Filled 2020-05-10: qty 20

## 2020-05-10 MED ORDER — ATORVASTATIN CALCIUM 10 MG PO TABS: 10.0000 mg | ORAL_TABLET | Freq: Every day | ORAL | Status: DC

## 2020-05-10 MED ORDER — DEXAMETHASONE SODIUM PHOSPHATE 10 MG/ML IJ SOLN
INTRAMUSCULAR | Status: AC
  Filled 2020-05-10: qty 1

## 2020-05-10 MED ORDER — GABAPENTIN 300 MG PO CAPS
300.0000 mg | ORAL_CAPSULE | ORAL | Status: AC
  Administered 2020-05-10: 300 mg via ORAL
  Filled 2020-05-10: qty 1

## 2020-05-10 MED ORDER — DIPHENHYDRAMINE HCL 25 MG PO CAPS: 25.0000 mg | ORAL_CAPSULE | Freq: Four times a day (QID) | ORAL | Status: DC | PRN

## 2020-05-10 MED ORDER — TRAMADOL HCL 50 MG PO TABS: 50.0000 mg | ORAL_TABLET | Freq: Four times a day (QID) | ORAL | 0 refills | Status: DC | PRN

## 2020-05-10 MED ORDER — PANTOPRAZOLE SODIUM 40 MG IV SOLR: 40.0000 mg | Freq: Every day | INTRAVENOUS | Status: DC

## 2020-05-10 MED ORDER — FENTANYL CITRATE (PF) 250 MCG/5ML IJ SOLN
INTRAMUSCULAR | Status: AC
  Filled 2020-05-10: qty 5

## 2020-05-10 MED ORDER — MORPHINE SULFATE (PF) 2 MG/ML IV SOLN: 2.0000 mg | INTRAVENOUS | Status: DC | PRN

## 2020-05-10 MED ORDER — ONDANSETRON 4 MG PO TBDP: 4.0000 mg | ORAL_TABLET | Freq: Four times a day (QID) | ORAL | Status: DC | PRN

## 2020-05-10 MED ORDER — ONDANSETRON HCL 4 MG/2ML IJ SOLN
INTRAMUSCULAR | Status: AC
  Filled 2020-05-10: qty 2

## 2020-05-10 MED ORDER — INSULIN ASPART 100 UNIT/ML ~~LOC~~ SOLN
0.0000 [IU] | Freq: Three times a day (TID) | SUBCUTANEOUS | Status: DC
  Filled 2020-05-10: qty 0.15

## 2020-05-10 MED ORDER — FLUTICASONE-UMECLIDIN-VILANT 100-62.5-25 MCG/INH IN AEPB: 1.0000 | INHALATION_SPRAY | Freq: Every day | RESPIRATORY_TRACT | Status: DC

## 2020-05-10 MED ORDER — ACETAMINOPHEN 325 MG PO TABS: 325.0000 mg | ORAL_TABLET | ORAL | Status: DC | PRN

## 2020-05-10 MED ORDER — LORAZEPAM 1 MG PO TABS: 1.0000 mg | ORAL_TABLET | Freq: Three times a day (TID) | ORAL | Status: DC | PRN

## 2020-05-10 MED ORDER — ROCURONIUM BROMIDE 10 MG/ML (PF) SYRINGE
PREFILLED_SYRINGE | INTRAVENOUS | Status: DC | PRN
  Administered 2020-05-10: 100 mg via INTRAVENOUS

## 2020-05-10 MED ORDER — FENTANYL CITRATE (PF) 100 MCG/2ML IJ SOLN
100.0000 ug | Freq: Once | INTRAMUSCULAR | Status: AC
  Administered 2020-05-10: 100 ug via INTRAVENOUS
  Filled 2020-05-10: qty 2

## 2020-05-10 MED ORDER — LEVOCETIRIZINE DIHYDROCHLORIDE 5 MG PO TABS: 5.0000 mg | ORAL_TABLET | Freq: Every evening | ORAL | Status: DC

## 2020-05-10 MED ORDER — OXYCODONE HCL 5 MG PO TABS
ORAL_TABLET | ORAL | Status: AC
  Filled 2020-05-10: qty 1

## 2020-05-10 MED ORDER — MIDAZOLAM HCL 2 MG/2ML IJ SOLN
INTRAMUSCULAR | Status: AC
  Filled 2020-05-10: qty 2

## 2020-05-10 MED ORDER — ONDANSETRON HCL 4 MG/2ML IJ SOLN: 4.0000 mg | Freq: Four times a day (QID) | INTRAMUSCULAR | Status: DC | PRN

## 2020-05-10 MED ORDER — INSULIN ASPART 100 UNIT/ML ~~LOC~~ SOLN
0.0000 [IU] | Freq: Every day | SUBCUTANEOUS | Status: DC
  Filled 2020-05-10: qty 0.05

## 2020-05-10 MED ORDER — ESCITALOPRAM OXALATE 10 MG PO TABS: 10.0000 mg | ORAL_TABLET | Freq: Every day | ORAL | Status: DC

## 2020-05-10 MED ORDER — OXYCODONE HCL 5 MG PO TABS: 5.0000 mg | ORAL_TABLET | ORAL | Status: DC | PRN

## 2020-05-10 MED ORDER — THYROID 60 MG PO TABS: 180.0000 mg | ORAL_TABLET | Freq: Every day | ORAL | Status: DC

## 2020-05-10 MED ORDER — AMISULPRIDE (ANTIEMETIC) 5 MG/2ML IV SOLN: 10.0000 mg | Freq: Once | INTRAVENOUS | Status: DC | PRN

## 2020-05-10 MED ORDER — ONDANSETRON HCL 4 MG PO TABS
4.0000 mg | ORAL_TABLET | Freq: Four times a day (QID) | ORAL | 0 refills | Status: DC
Start: 2020-05-10 — End: 2020-10-18

## 2020-05-10 MED ORDER — PROMETHAZINE HCL 25 MG/ML IJ SOLN: 6.2500 mg | INTRAMUSCULAR | Status: DC | PRN

## 2020-05-10 SURGICAL SUPPLY — 44 items
APPLIER CLIP ROT 10 11.4 M/L (STAPLE) ×2
CABLE HIGH FREQUENCY MONO STRZ (ELECTRODE) ×2 IMPLANT
CATH URET 5FR 28IN OPEN ENDED (CATHETERS) ×2 IMPLANT
CHLORAPREP W/TINT 26 (MISCELLANEOUS) ×2 IMPLANT
CLIP APPLIE ROT 10 11.4 M/L (STAPLE) ×1 IMPLANT
COVER MAYO STAND STRL (DRAPES) IMPLANT
COVER SURGICAL LIGHT HANDLE (MISCELLANEOUS) ×2 IMPLANT
COVER WAND RF STERILE (DRAPES) IMPLANT
DECANTER SPIKE VIAL GLASS SM (MISCELLANEOUS) ×2 IMPLANT
DERMABOND ADVANCED (GAUZE/BANDAGES/DRESSINGS) ×1
DERMABOND ADVANCED .7 DNX12 (GAUZE/BANDAGES/DRESSINGS) ×1 IMPLANT
DRAPE C-ARM 42X120 X-RAY (DRAPES) ×2 IMPLANT
DRSG TELFA 3X8 NADH (GAUZE/BANDAGES/DRESSINGS) ×2 IMPLANT
ELECT PENCIL ROCKER SW 15FT (MISCELLANEOUS) ×2 IMPLANT
ELECT REM PT RETURN 15FT ADLT (MISCELLANEOUS) ×2 IMPLANT
ENDOLOOP SUT PDS II  0 18 (SUTURE) ×2
ENDOLOOP SUT PDS II 0 18 (SUTURE) ×1 IMPLANT
GLOVE SRG 8 PF TXTR STRL LF DI (GLOVE) ×1 IMPLANT
GLOVE SURG ENC MOIS LTX SZ7.5 (GLOVE) ×2 IMPLANT
GLOVE SURG UNDER POLY LF SZ8 (GLOVE) ×2
GOWN STRL REUS W/TWL LRG LVL3 (GOWN DISPOSABLE) ×2 IMPLANT
GOWN STRL REUS W/TWL XL LVL3 (GOWN DISPOSABLE) ×4 IMPLANT
GRASPER SUT TROCAR 14GX15 (MISCELLANEOUS) ×2 IMPLANT
HEMOSTAT SNOW SURGICEL 2X4 (HEMOSTASIS) IMPLANT
IRRIG SUCT STRYKERFLOW 2 WTIP (MISCELLANEOUS) ×2
IRRIGATION SUCT STRKRFLW 2 WTP (MISCELLANEOUS) ×1 IMPLANT
IV CATH 14GX2 1/4 (CATHETERS) ×2 IMPLANT
KIT BASIN OR (CUSTOM PROCEDURE TRAY) ×2 IMPLANT
KIT TURNOVER KIT A (KITS) ×2 IMPLANT
NEEDLE BIOPSY 14X6 SOFT TISS (NEEDLE) ×2 IMPLANT
NEEDLE INSUFFLATION 14GA 120MM (NEEDLE) ×2 IMPLANT
PENCIL SMOKE EVACUATOR (MISCELLANEOUS) IMPLANT
POUCH RETRIEVAL ECOSAC 10 (ENDOMECHANICALS) ×1 IMPLANT
POUCH RETRIEVAL ECOSAC 10MM (ENDOMECHANICALS) ×2
SCISSORS LAP 5X35 DISP (ENDOMECHANICALS) ×2 IMPLANT
SET TUBE SMOKE EVAC HIGH FLOW (TUBING) ×2 IMPLANT
SLEEVE XCEL OPT CAN 5 100 (ENDOMECHANICALS) ×4 IMPLANT
STOPCOCK 4 WAY LG BORE MALE ST (IV SETS) ×2 IMPLANT
SUT MNCRL AB 4-0 PS2 18 (SUTURE) ×2 IMPLANT
TOWEL OR 17X26 10 PK STRL BLUE (TOWEL DISPOSABLE) ×2 IMPLANT
TOWEL OR NON WOVEN STRL DISP B (DISPOSABLE) ×2 IMPLANT
TRAY LAPAROSCOPIC (CUSTOM PROCEDURE TRAY) ×2 IMPLANT
TROCAR BLADELESS OPT 5 100 (ENDOMECHANICALS) ×2 IMPLANT
TROCAR XCEL 12X100 BLDLESS (ENDOMECHANICALS) ×2 IMPLANT

## 2020-05-10 NOTE — ED Notes (Signed)
Short Stay tech here for the patient- ready for the or

## 2020-05-10 NOTE — Anesthesia Procedure Notes (Signed)
Procedure Name: Intubation Date/Time: 05/10/2020 2:07 PM Performed by: Mitzie Na, CRNA Pre-anesthesia Checklist: Patient identified, Emergency Drugs available, Suction available and Patient being monitored Patient Re-evaluated:Patient Re-evaluated prior to induction Oxygen Delivery Method: Circle system utilized Preoxygenation: Pre-oxygenation with 100% oxygen Induction Type: IV induction Ventilation: Mask ventilation without difficulty Laryngoscope Size: Mac and 4 Grade View: Grade I Tube type: Oral Tube size: 7.0 mm Number of attempts: 1 Airway Equipment and Method: Stylet Placement Confirmation: ETT inserted through vocal cords under direct vision,  positive ETCO2 and breath sounds checked- equal and bilateral Secured at: 22 cm Tube secured with: Tape Dental Injury: Teeth and Oropharynx as per pre-operative assessment

## 2020-05-10 NOTE — Op Note (Signed)
Patient: Teresa Castaneda (09-09-1970, [id number])  Date of Surgery: 05/10/2020   Preoperative Diagnosis: Cholecystitis   Postoperative Diagnosis: Cholecystitis, liver cirrhosis  Surgical Procedure:  1. LAPAROSCOPIC CHOLECYSTECTOMY WITH INTRAOPERATIVE CHOLANGIOGRAM  2. LIVER BIOPSY  Operative Team Members:  Surgeon(s) and Role:    * Kacey Dysert, Nickola Major, MD - Primary   Wonda Cerise, MD - [facility] Resident Assistant  Anesthesiologist: Tonna Boehringer, MD CRNA: Victoriano Lain, CRNA; Mitzie Na, CRNA   Anesthesia: General   Fluids:  Total I/O In: 600.5 [IV TJQZESPQZ:300.7] Out: -   Complications: None  Drains:  none   Specimen:  ID Type Source Tests Collected by Time Destination  1 : Gallbladder Tissue PATH Gallbladder SURGICAL PATHOLOGY Raylon Lamson, Nickola Major, MD 05/10/2020 1456   2 : Liver biopsy Tissue PATH Liver tumor biopsy SURGICAL PATHOLOGY Adrielle Polakowski, Nickola Major, MD 05/10/2020 1511      Disposition:  PACU - hemodynamically stable.  Plan of Care: Discharge to home after PACU    Indications for Procedure: Teresa Castaneda is a 50 y.o. female who presented with right upper quadrant abdominal pain.Marland Kitchen  History, physical and imaging was concerning for cholecystitis.  Laparoscopic cholecystectomy was recommended for the patient.  The procedure itself, as well as the risks, benefits and alternatives were discussed with the patient.  Risks discussed included but were not limited to the risk of infection, bleeding, damage to nearby structures, need to convert to open procedure, incisional hernia, bile leak, common bile duct injury and the need for additional procedures or surgeries.  With this discussion complete and all questions answered the patient granted consent to proceed.  Findings: Inflamed gallbladder. Normal cholangiogram.  Liver cirrhosis.  Description of Procedure:   On the date stated above, the patient was taken to the operating room  suite and placed in supine positioning.  Sequential compression devices were placed on the lower extremities to prevent blood clots.  General endotracheal anesthesia was induced. Preoperative antibiotics were given within 30 minutes of incision.  The patient's abdomen was prepped and draped in the usual sterile fashion.  A time-out was completed verifying the correct patient, procedure, positioning and equipment needed for the case.  We began by anesthetizing the skin with local anesthetic and then making a 5 mm incision just below the umbilicus.  We dissected through the subcutaneous tissues to the fascia.  The fascia was grasped and elevated using a Kocher clamp.  A Veress needle was inserted into the abdomen and the abdomen was insufflated to 15 mmHg.  A 5 mm trocar was inserted in this position under optical guidance and then the abdomen was inspected.  There was no trauma to the underlying viscera with initial trocar placement.  Any abnormal findings, other than inflammation in the right upper quadrant, are listed above in the findings section.  Three additional trocars were placed, one 12 mm trocar in the subxiphoid position, one 5 mm trocar in the midline epigastric area and one 76m trocar in the right upper quadrant subcostally.  These were placed under direct vision without any trauma to the underlying viscera.    The patient was then placed in head up, left side down positioning.  The gallbladder was identified and dissected free from its attachments to the omentum allowing the duodenum to fall away.  The infundibulum of the gallbladder was dissected free working laterally to medially.  The cystic duct and cystic artery were dissected free from surrounding connective tissue.  The infundibulum  of the gallbladder was dissected off the cystic plate.  A critical view of safety was obtained with the cystic duct and cystic artery being cleared of connective tissues and clearly the only two structures  entering into the gallbladder with the liver clearly visible behind.  One clip was applied high on the cystic duct.  A small ductotomy was created below this using the endoscopic shears.  A cholangiogram catheter was introduced through the abdominal wall and into the cystic duct through this ductotomy.  The catheter was clipped into position.  The catheter was flushed to ensure no leakage around the clip.  We then removed the laparoscopic instruments and positioned the C-Arm to perform a cholangiogram.  The catheter was flushed with contrast under fluoroscopic visualization and a cholangiogram was obtained.  The cholangiogram visualized the biliary tree from the ampulla up to the first two biliary radicals in the liver.  There were no filling defects identified.  The catheter clearly entered the cystic duct.  There was gradual tapering of the common bile duct down to the ampulla without evidence of stricture or other abnormalities.  Please see the EMR for saved representative images.  With our cholangiogram compete, we moved the c-arm away from the field and returned to laparoscopic surgery.  Clips were then applied to the cystic duct and cystic artery and then these structures were divided.  The gallbladder was dissected off the cystic plate, placed in an endocatch bag and removed from the 12 mm subxiphoid port site.  The clips were inspected and appeared effective.  The cystic plate was inspected and hemostasis was obtained using electrocautery.  A suction irrigator was used to clean the operative field.    Then a core needle biopsy was taken due to the cirrhotic appearance of the liver.  Two passes were taken with the core needle biopsy.  The hook cautery was used to obtain hemostasis at the liver capsule entry point.  There was good hemostasis and the liver biopsy was passed off as a specimen.  Attention was turned to closure.  The 12 mm subxiphoid port site was closed using a 0-vicryl suture on a fascial  suture passer.  The abdomen was desufflated.  The skin was closed using 4-0 monocryl and dermabond.  All sponge and needle counts were correct at the conclusion of the case.    Louanna Raw, MD General, Bariatric, & Minimally Invasive Surgery [facility], [place]

## 2020-05-10 NOTE — ED Triage Notes (Signed)
Complains of RUQ abdominal pain for 3 weeks. PCP aware and suggested GI consult. No consult made yet. Has gallstones.

## 2020-05-10 NOTE — Anesthesia Preprocedure Evaluation (Addendum)
Anesthesia Evaluation  Patient identified by MRN, date of birth, ID band Patient awake    Reviewed: Allergy & Precautions, NPO status , Patient's Chart, lab work & pertinent test results  Airway Mallampati: I       Dental  (+) Edentulous Upper, Dental Advisory Given   Pulmonary asthma , former smoker,    breath sounds clear to auscultation       Cardiovascular hypertension, Pt. on medications  Rhythm:Regular Rate:Normal  Echo (2021): 1. Left ventricular ejection fraction, by estimation, is 65 to 70%. The  left ventricle has normal function. The left ventricle has no regional  wall motion abnormalities. There is mild concentric left ventricular  hypertrophy. Left ventricular diastolic  parameters are consistent with Grade I diastolic dysfunction (impaired  relaxation). Elevated left atrial pressure.  2. Right ventricular systolic function is normal. The right ventricular  size is normal. There is normal pulmonary artery systolic pressure. The  estimated right ventricular systolic pressure is 64.6 mmHg.  3. Left atrial size was mildly dilated.  4. The mitral valve is normal in structure. Mild mitral valve  regurgitation. No evidence of mitral stenosis.  5. The aortic valve is normal in structure. Aortic valve regurgitation is  not visualized. No aortic stenosis is present.  6. The inferior vena cava is normal in size with greater than 50%  respiratory variability, suggesting right atrial pressure of 3 mmHg.    Neuro/Psych  Headaches, PSYCHIATRIC DISORDERS Anxiety Depression    GI/Hepatic Neg liver ROS, GERD  Controlled,  Endo/Other  diabetes, Type 2, Oral Hypoglycemic AgentsHypothyroidism   Renal/GU negative Renal ROS     Musculoskeletal  (+) Arthritis ,   Abdominal (+) + obese,   Peds  Hematology negative hematology ROS (+)   Anesthesia Other Findings   Reproductive/Obstetrics                           Anesthesia Physical Anesthesia Plan  ASA: III  Anesthesia Plan: General   Post-op Pain Management:    Induction: Intravenous  PONV Risk Score and Plan: 4 or greater and Ondansetron, Dexamethasone, Midazolam and Scopolamine patch - Pre-op  Airway Management Planned: Oral ETT  Additional Equipment: None  Intra-op Plan:   Post-operative Plan: Extubation in OR  Informed Consent: I have reviewed the patients History and Physical, chart, labs and discussed the procedure including the risks, benefits and alternatives for the proposed anesthesia with the patient or authorized representative who has indicated his/her understanding and acceptance.     Dental advisory given  Plan Discussed with: CRNA  Anesthesia Plan Comments: (Inhaler used in preop x3  Lab Results      Component                Value               Date                      WBC                      6.5                 05/10/2020                HGB                      13.7  05/10/2020                HCT                      41.3                05/10/2020                MCV                      85.5                05/10/2020                PLT                      126 (L)             05/10/2020            Lab Results      Component                Value               Date                      CREATININE               0.51                05/10/2020                BUN                      8                   05/10/2020                NA                       135                 05/10/2020                K                        3.9                 05/10/2020                CL                       101                 05/10/2020                CO2                      24                  05/10/2020            Glucose 216 )      Anesthesia Quick Evaluation

## 2020-05-10 NOTE — ED Notes (Signed)
Report given to [facility] at [place]

## 2020-05-10 NOTE — Discharge Instructions (Signed)
General Anesthesia, Adult, Care After This sheet gives you information about how to care for yourself after your procedure. Your health care provider may also give you more specific instructions. If you have problems or questions, contact your health care provider. What can I expect after the procedure? After the procedure, the following side effects are common:  Pain or discomfort at the IV site.  Nausea.  Vomiting.  Sore throat.  Trouble concentrating.  Feeling cold or chills.  Feeling weak or tired.  Sleepiness and fatigue.  Soreness and body aches. These side effects can affect parts of the body that were not involved in surgery. Follow these instructions at home: For the time period you were told by your health care provider:  Rest.  Do not participate in activities where you could fall or become injured.  Do not drive or use machinery.  Do not drink alcohol.  Do not take sleeping pills or medicines that cause drowsiness.  Do not make important decisions or sign legal documents.  Do not take care of children on your own.   Eating and drinking  Follow any instructions from your health care provider about eating or drinking restrictions.  When you feel hungry, start by eating small amounts of foods that are soft and easy to digest (bland), such as toast. Gradually return to your regular diet.  Drink enough fluid to keep your urine pale yellow.  If you vomit, rehydrate by drinking water, juice, or clear broth. General instructions  If you have sleep apnea, surgery and certain medicines can increase your risk for breathing problems. Follow instructions from your health care provider about wearing your sleep device: ? Anytime you are sleeping, including during daytime naps. ? While taking prescription pain medicines, sleeping medicines, or medicines that make you drowsy.  Have a responsible adult stay with you for the time you are told. It is important to have  someone help care for you until you are awake and alert.  Return to your normal activities as told by your health care provider. Ask your health care provider what activities are safe for you.  Take over-the-counter and prescription medicines only as told by your health care provider.  If you smoke, do not smoke without supervision.  Keep all follow-up visits as told by your health care provider. This is important. Contact a health care provider if:  You have nausea or vomiting that does not get better with medicine.  You cannot eat or drink without vomiting.  You have pain that does not get better with medicine.  You are unable to pass urine.  You develop a skin rash.  You have a fever.  You have redness around your IV site that gets worse. Get help right away if:  You have difficulty breathing.  You have chest pain.  You have blood in your urine or stool, or you vomit blood. Summary  After the procedure, it is common to have a sore throat or nausea. It is also common to feel tired.  Have a responsible adult stay with you for the time you are told. It is important to have someone help care for you until you are awake and alert.  When you feel hungry, start by eating small amounts of foods that are soft and easy to digest (bland), such as toast. Gradually return to your regular diet.  Drink enough fluid to keep your urine pale yellow.  Return to your normal activities as told by your health care provider.  Ask your health care provider what activities are safe for you. This information is not intended to replace advice given to you by your health care provider. Make sure you discuss any questions you have with your health care provider. Document Revised: 09/18/2019 Document Reviewed: 04/17/2019 Elsevier Patient Education  2021 [employer]  Please arrive at least 30 min before your appointment to complete your check in paperwork.  If  you are unable to arrive 30 min prior to your appointment time we may have to cancel or reschedule you. LAPAROSCOPIC SURGERY: POST OP INSTRUCTIONS Always review your discharge instruction sheet given to you by the facility where your surgery was performed. IF YOU HAVE DISABILITY OR FAMILY LEAVE FORMS, YOU MUST BRING THEM TO THE OFFICE FOR PROCESSING.   DO NOT GIVE THEM TO YOUR DOCTOR.  PAIN CONTROL  1. First take acetaminophen (Tylenol) AND/or ibuprofen (Advil) to control your pain after surgery.  Follow directions on package.  Taking acetaminophen (Tylenol) and/or ibuprofen (Advil) regularly after surgery will help to control your pain and lower the amount of prescription pain medication you may need.  You should not take more than 4,000 mg (4 grams) of acetaminophen (Tylenol) in 24 hours.  You should not take ibuprofen (Advil), aleve, motrin, naprosyn or other NSAIDS if you have a history of stomach ulcers or chronic kidney disease.  2. A prescription for pain medication may be given to you upon discharge.  Take your pain medication as prescribed, if you still have uncontrolled pain after taking acetaminophen (Tylenol) or ibuprofen (Advil). 3. Use ice packs to help control pain. 4. If you need a refill on your pain medication, please contact your pharmacy.  They will contact our office to request authorization. Prescriptions will not be filled after 5pm or on week-ends.  HOME MEDICATIONS 5. Take your usually prescribed medications unless otherwise directed.  DIET 6. You should follow a light diet the first few days after arrival home.  Be sure to include lots of fluids daily. Avoid fatty, fried foods.   CONSTIPATION 7. It is common to experience some constipation after surgery and if you are taking pain medication.  Increasing fluid intake and taking a stool softener (such as Colace) will usually help or prevent this problem from occurring.  A mild laxative (Milk of Magnesia or Miralax) should  be taken according to package instructions if there are no bowel movements after 48 hours.  WOUND/INCISION CARE 8. Most patients will experience some swelling and bruising in the area of the incisions.  Ice packs will help.  Swelling and bruising can take several days to resolve.  9. Unless discharge instructions indicate otherwise, follow guidelines below  a. STERI-STRIPS - you may remove your outer bandages 48 hours after surgery, and you may shower at that time.  You have steri-strips (small skin tapes) in place directly over the incision.  These strips should be left on the skin for 7-10 days.   b. DERMABOND/SKIN GLUE - you may shower in 24 hours.  The glue will flake off over the next 2-3 weeks. 10. Any sutures or staples will be removed at the office during your follow-up visit.  ACTIVITIES 11. You may resume regular (light) daily activities beginning the next day--such as daily self-care, walking, climbing stairs--gradually increasing activities as tolerated.  You may have sexual intercourse when it is comfortable.  Refrain from any heavy lifting or straining until approved by your doctor. a. You may drive when you  are no longer taking prescription pain medication, you can comfortably wear a seatbelt, and you can safely maneuver your car and apply brakes.  FOLLOW-UP 12. You should see your doctor in the office for a follow-up appointment approximately 2-3 weeks after your surgery.  You should have been given your post-op/follow-up appointment when your surgery was scheduled.  If you did not receive a post-op/follow-up appointment, make sure that you call for this appointment within a day or two after you arrive home to insure a convenient appointment time.   WHEN TO CALL YOUR DOCTOR: 1. Fever over 101.0 2. Inability to urinate 3. Continued bleeding from incision. 4. Increased pain, redness, or drainage from the incision. 5. Increasing abdominal pain  The clinic staff is available to  answer your questions during regular business hours.  Please don't hesitate to call and ask to speak to one of the nurses for clinical concerns.  If you have a medical emergency, go to the nearest emergency room or call 911.  A surgeon from [facility] is always on call at the hospital. [address] [place], [place], [place]  [address] ? [address], [place], [place]   [address] [phone] ? [phone] ? FAX [phone]   Gallbladder Eating Plan If you have a gallbladder condition, you may have trouble digesting fats. Eating a low-fat diet can help reduce your symptoms, and may be helpful before and after having surgery to remove your gallbladder (cholecystectomy). Your health care provider may recommend that you work with a diet and nutrition specialist (dietitian) to help you reduce the amount of fat in your diet. What are tips for following this plan? General guidelines  Limit your fat intake to less than 30% of your total daily calories. If you eat around 1,800 calories each day, this is less than 60 grams (g) of fat per day.  Fat is an important part of a healthy diet. Eating a low-fat diet can make it hard to maintain a healthy body weight. Ask your dietitian how much fat, calories, and other nutrients you need each day.  Eat small, frequent meals throughout the day instead of three large meals.  Drink at least 8-10 cups of fluid a day. Drink enough fluid to keep your urine clear or pale yellow.  Limit alcohol intake to no more than 1 drink a day for nonpregnant women and 2 drinks a day for men. One drink equals 12 oz of beer, 5 oz of wine, or 1 oz of hard liquor. Reading food labels  Check Nutrition Facts on food labels for the amount of fat per serving. Choose foods with less than 3 grams of fat per serving.   Shopping  Choose nonfat and low-fat healthy foods. Look for the words "nonfat," "low fat," or "fat free."  Avoid buying processed or prepackaged  foods. Cooking  Cook using low-fat methods, such as baking, broiling, grilling, or boiling.  Cook with small amounts of healthy fats, such as olive oil, grapeseed oil, canola oil, or sunflower oil. What foods are recommended?  All fresh, frozen, or canned fruits and vegetables.  Whole grains.  Low-fat or non-fat (skim) milk and yogurt.  Lean meat, skinless poultry, fish, eggs, and beans.  Low-fat protein supplement powders or drinks.  Spices and herbs. What foods are not recommended?  High-fat foods. These include baked goods, fast food, fatty cuts of meat, ice cream, french toast, sweet rolls, pizza, cheese bread, foods covered with butter, creamy sauces, or cheese.  Fried foods. These include french fries, tempura, battered fish, breaded chicken, fried breads, and sweets.  Foods with strong odors.  Foods that cause bloating and gas. Summary  A low-fat diet can be helpful if you have a gallbladder condition, or before and after gallbladder surgery.  Limit your fat intake to less than 30% of your total daily calories. This is about 60 g of fat if you eat 1,800 calories each day.  Eat small, frequent meals throughout the day instead of three large meals. This information is not intended to replace advice given to you by your health care provider. Make sure you discuss any questions you have with your health care provider. Document Revised: 08/21/2019 Document Reviewed: 08/21/2019 Elsevier Patient Education  2021 [employer].

## 2020-05-10 NOTE — Transfer of Care (Signed)
Immediate Anesthesia Transfer of Care Note  Patient: Teresa Castaneda  Procedure(s) Performed: LAPAROSCOPIC CHOLECYSTECTOMY WITH INTRAOPERATIVE CHOLANGIOGRAM AND LIVER BIOPSY (N/A Abdomen)  Patient Location: PACU  Anesthesia Type:General  Level of Consciousness: drowsy  Airway & Oxygen Therapy: Patient Spontanous Breathing and Patient connected to face mask oxygen  Post-op Assessment: Report given to RN, Post -op Vital signs reviewed and stable and Patient moving all extremities  Post vital signs: Reviewed and stable  Last Vitals:  Vitals Value Taken Time  BP 149/76 05/10/20 1535  Temp    Pulse 103 05/10/20 1537  Resp 19 05/10/20 1537  SpO2 94 % 05/10/20 1537  Vitals shown include unvalidated device data.  Last Pain:  Vitals:   05/10/20 1300  TempSrc: Oral  PainSc: 2       Patients Stated Pain Goal: 2 (02/63/78 5885)  Complications: No complications documented.

## 2020-05-10 NOTE — Anesthesia Postprocedure Evaluation (Signed)
Anesthesia Post Note  Patient: Teresa Castaneda  Procedure(s) Performed: LAPAROSCOPIC CHOLECYSTECTOMY WITH INTRAOPERATIVE CHOLANGIOGRAM AND LIVER BIOPSY (N/A Abdomen)     Patient location during evaluation: PACU Anesthesia Type: General Level of consciousness: awake and alert Pain management: pain level controlled Vital Signs Assessment: post-procedure vital signs reviewed and stable Respiratory status: spontaneous breathing, nonlabored ventilation, respiratory function stable and patient connected to nasal cannula oxygen Cardiovascular status: blood pressure returned to baseline and stable Postop Assessment: no apparent nausea or vomiting Anesthetic complications: no   No complications documented.  Last Vitals:  Vitals:   05/10/20 1300 05/10/20 1545  BP: (!) 122/55 (!) 146/79  Pulse: 81 99  Resp: 18 17  Temp: 36.9 C 37.1 C  SpO2: 94% 94%    Last Pain:  Vitals:   05/10/20 1545  TempSrc:   PainSc: 0-No pain                 Jisell Majer COKER

## 2020-05-10 NOTE — ED Notes (Signed)
Report given to Short Stay-  CHG bath completed.

## 2020-05-10 NOTE — H&P (Signed)
[facility] Surgery Consult Note  Teresa Castaneda 1970/11/22  [id number].    Requesting MD: Blanchie Dessert Chief Complaint/Reason for Consult: cholecystitis   HPI:  Teresa Castaneda is a 50yo female PMH asthma, hx covid pneumonia 08/2019, nocturnal hypoxia on 2L O2 at night, hypothyroidism, HLD, GERD, DM, obesity BMI 42, and anxiety who was transferred from [facility] to [facility] earlier today complaining of RUQ pain. States that the pain began about 3 weeks ago. It has gradually gotten more severe. Worse with PO intake, especially fried food. Associated with nausea vomiting and diarrhea. Denies fever or chills.   In the ED her vital signs were found to be stable. WBC 6.5, LFTs and lipase essentially normal. U/s shows Cholelithiasis without evidence of cholecystitis. General surgery asked to see.  Abdominal surgical history: hysterectomy Anticoagulants: none Former smoker Denies alcohol or illicit drug use Employment: [employer] receiving [employer]   Review of Systems  Constitutional: Negative.  Negative for chills and fever.  Respiratory: Positive for shortness of breath.   Cardiovascular: Negative.   Gastrointestinal: Positive for abdominal pain, diarrhea and nausea.  Genitourinary: Negative for dysuria.  Psychiatric/Behavioral: Negative for substance abuse.  All other systems reviewed and are negative.  All systems reviewed and otherwise negative except for as above  Family History  Problem Relation Age of Onset  . Coronary artery disease Mother   . Diabetes Mother   . COPD Mother   . Coronary artery disease Father   . Hypertension Father     Past Medical History:  Diagnosis Date  . Allergies   . Arthritis   . Asthma   . Back pain    Spinal Issue   . Diabetes mellitus without complication ([place])   . DJD (degenerative joint disease), lumbar   . Thyroid disease     Past Surgical History:  Procedure Laterality Date  . ABDOMINAL HYSTERECTOMY      Social  History:  reports that she quit smoking about 7 months ago. Her smoking use included cigarettes. She started smoking about 35 years ago. She smoked 0.50 packs per day. She has never used smokeless tobacco. She reports that she does not drink alcohol and does not use drugs. Lives at home with her husband.   Allergies:  Allergies  Allergen Reactions  . Clarithromycin Other (See Comments)    Body cramps   . Codeine Nausea And Vomiting    (Not in a hospital admission)   Prior to Admission medications   Medication Sig Start Date End Date Taking? Authorizing Provider  albuterol (VENTOLIN HFA) 108 (90 Base) MCG/ACT inhaler Inhale 2 puffs into the lungs every 6 (six) hours as needed. For shortness of breath and wheezing 09/01/19  Yes Ok Edwards, PA-C  ARMOUR THYROID 180 MG tablet Take 180 mg by mouth daily. 09/07/19  Yes [provider]  atorvastatin (LIPITOR) 10 MG tablet Take 1 tablet (10 mg total) by mouth daily. 12/17/19  Yes Jettie Booze, MD  Azelastine-Fluticasone [facility]) 137-50 MCG/ACT SUSP Place 1 spray into the nose in the morning and at bedtime. 03/10/20  Yes Just, Laurita Quint, FNP  escitalopram (LEXAPRO) 10 MG tablet Take 1 tablet (10 mg total) by mouth daily. 02/12/20  Yes Wendie Agreste, MD  Fluticasone-Umeclidin-Vilant (TRELEGY ELLIPTA) 100-62.5-25 MCG/INH AEPB Inhale 1 puff into the lungs daily. 10/13/19  Yes Freddi Starr, MD  ibuprofen (ADVIL) 600 MG tablet Take 1 tablet (600 mg total) by mouth every 6 (six) hours as needed. 12/26/19  Yes Lamptey, Myrene Galas, MD  levocetirizine (XYZAL) 5 MG tablet Take 5 mg by mouth every evening.   Yes [provider]  metFORMIN (GLUCOPHAGE) 1000 MG tablet Take 1 tablet (1,000 mg total) by mouth 2 (two) times daily with a meal. 03/25/20  Yes Wendie Agreste, MD  omeprazole (PRILOSEC) 40 MG capsule Take 40 mg by mouth daily. 09/25/19  Yes [provider]  ondansetron (ZOFRAN) 4 MG tablet Take 1 tablet (4 mg total) by  mouth every 8 (eight) hours as needed for nausea or vomiting. 04/28/20  Yes Maximiano Coss, NP  furosemide (LASIX) 20 MG tablet Take 0.5 tablets (10 mg total) by mouth daily. 11/17/19   Freddi Starr, MD  LORazepam (ATIVAN) 1 MG tablet Take 1 mg by mouth every 8 (eight) hours as needed for anxiety.    [provider]  montelukast (SINGULAIR) 10 MG tablet Take 10 mg by mouth at bedtime.    [provider]  pseudoephedrine (SUDAFED 12 HOUR) 120 MG 12 hr tablet Take 1 tablet (120 mg total) by mouth 2 (two) times daily. 03/10/20   Just, Laurita Quint, FNP  thyroid (ARMOUR THYROID) 15 MG tablet Take 1 tablet (15 mg total) by mouth daily. For total dose 138m qd. 02/14/20   GWendie Agreste MD  azelastine (ASTELIN) 0.1 % nasal spray Place 2 sprays into both nostrils 2 (two) times daily. 04/23/19 12/26/19  YTasia Catchings Amy V, PA-C    Blood pressure 123/65, pulse 79, temperature 97.7 F (36.5 C), temperature source Oral, resp. rate 20, height 5' 6"  (1.676 m), weight 117.9 kg, SpO2 93 %. Physical Exam: General: pleasant, WD/WN female who is laying in bed in NAD HEENT: head is normocephalic, atraumatic.  Sclera are noninjected.  PERRL.  Ears and nose without any masses or lesions.  Mouth is pink and moist. Dentition fair on bottom. Edentulous upper.  Heart: regular, rate, and rhythm.  Normal s1,s2. No obvious murmurs, gallops, or rubs noted.  Palpable pedal pulses bilaterally  Lungs: CTAB, no wheezes, rhonchi, or rales noted.  Respiratory effort nonlabored. On RA Abd: Soft, protuberant abdomen that appears ND, tenderness of the RUQ without peritonitis. Abdomen is otherwise NT. +BS, no masses, hernias, or organomegaly MS: no BUE/BLE edema, calves soft and nontender Skin: warm and dry with no masses, lesions, or rashes Psych: A&Ox4 with an appropriate affect Neuro: cranial nerves grossly intact, equal strength in BUE/BLE bilaterally, normal speech, thought process intact, moves all extremities.    Results for orders placed or performed during the hospital encounter of 05/10/20 (from the past 48 hour(s))  CBC with Differential     Status: Abnormal   Collection Time: 05/10/20  8:10 AM  Result Value Ref Range   WBC 6.5 4.0 - 10.5 K/uL   RBC 4.83 3.87 - 5.11 MIL/uL   Hemoglobin 13.7 12.0 - 15.0 g/dL   HCT 41.3 36.0 - 46.0 %   MCV 85.5 80.0 - 100.0 fL   MCH 28.4 26.0 - 34.0 pg   MCHC 33.2 30.0 - 36.0 g/dL   RDW 13.2 11.5 - 15.5 %   Platelets 126 (L) 150 - 400 K/uL   nRBC 0.0 0.0 - 0.2 %   Neutrophils Relative % 51 %   Neutro Abs 3.3 1.7 - 7.7 K/uL   Lymphocytes Relative 37 %   Lymphs Abs 2.4 0.7 - 4.0 K/uL   Monocytes Relative 6 %   Monocytes Absolute 0.4 0.1 - 1.0 K/uL   Eosinophils Relative 5 %  Eosinophils Absolute 0.3 0.0 - 0.5 K/uL   Basophils Relative 1 %   Basophils Absolute 0.0 0.0 - 0.1 K/uL   Immature Granulocytes 0 %   Abs Immature Granulocytes 0.01 0.00 - 0.07 K/uL    Comment: Performed at [facility], [address] [place], [place] [address]  Comprehensive metabolic panel     Status: Abnormal   Collection Time: 05/10/20  8:10 AM  Result Value Ref Range   Sodium 135 135 - 145 mmol/L   Potassium 3.9 3.5 - 5.1 mmol/L   Chloride 101 98 - 111 mmol/L   CO2 24 22 - 32 mmol/L   Glucose, Bld 216 (H) 70 - 99 mg/dL    Comment: Glucose reference range applies only to samples taken after fasting for at least 8 hours.   BUN 8 6 - 20 mg/dL   Creatinine, Ser 0.51 0.44 - 1.00 mg/dL   Calcium 9.0 8.9 - 10.3 mg/dL   Total Protein 7.4 6.5 - 8.1 g/dL   Albumin 3.2 (L) 3.5 - 5.0 g/dL   AST 47 (H) 15 - 41 U/L   ALT 41 0 - 44 U/L   Alkaline Phosphatase 82 38 - 126 U/L   Total Bilirubin 0.8 0.3 - 1.2 mg/dL   GFR, Estimated >60 >60 mL/min    Comment: (NOTE) Calculated using the CKD-EPI Creatinine Equation (2021)    Anion gap 10 5 - 15    Comment: Performed at [facility], [address] [place], [place] [address]  Lipase, blood     Status: None    Collection Time: 05/10/20  8:10 AM  Result Value Ref Range   Lipase 43 11 - 51 U/L    Comment: Performed at [facility], [address] [place], [place] [address]  Resp Panel by RT-PCR (Flu A&B, Covid) Nasopharyngeal Swab     Status: None   Collection Time: 05/10/20  9:42 AM   Specimen: Nasopharyngeal Swab; Nasopharyngeal(NP) swabs in vial transport medium  Result Value Ref Range   SARS Coronavirus 2 by RT PCR NEGATIVE NEGATIVE    Comment: (NOTE) SARS-CoV-2 target nucleic acids are NOT DETECTED.  The SARS-CoV-2 RNA is generally detectable in upper respiratory specimens during the acute phase of infection. The lowest concentration of SARS-CoV-2 viral copies this assay can detect is 138 copies/mL. A negative result does not preclude SARS-Cov-2 infection and should not be used as the sole basis for treatment or other patient management decisions. A negative result may occur with  improper specimen collection/handling, submission of specimen other than nasopharyngeal swab, presence of viral mutation(s) within the areas targeted by this assay, and inadequate number of viral copies(<138 copies/mL). A negative result must be combined with clinical observations, patient history, and epidemiological information. The expected result is Negative.  Fact Sheet for Patients:  EntrepreneurPulse.com.au  Fact Sheet for Healthcare Providers:  IncredibleEmployment.be  This test is no t yet approved or cleared by the [place] FDA and  has been authorized for detection and/or diagnosis of SARS-CoV-2 by FDA under an Emergency Use Authorization (EUA). This EUA will remain  in effect (meaning this test can be used) for the duration of the COVID-19 declaration under Section 564(b)(1) of the Act, 21 U.S.C.section 360bbb-3(b)(1), unless the authorization is terminated  or revoked sooner.       Influenza A by PCR NEGATIVE NEGATIVE   Influenza B by  PCR NEGATIVE NEGATIVE    Comment: (NOTE) The Xpert Xpress  SARS-CoV-2/FLU/RSV plus assay is intended as an aid in the diagnosis of influenza from Nasopharyngeal swab specimens and should not be used as a sole basis for treatment. Nasal washings and aspirates are unacceptable for Xpert Xpress SARS-CoV-2/FLU/RSV testing.  Fact Sheet for Patients: EntrepreneurPulse.com.au  Fact Sheet for Healthcare Providers: IncredibleEmployment.be  This test is not yet approved or cleared by the [place] FDA and has been authorized for detection and/or diagnosis of SARS-CoV-2 by FDA under an Emergency Use Authorization (EUA). This EUA will remain in effect (meaning this test can be used) for the duration of the COVID-19 declaration under Section 564(b)(1) of the Act, 21 U.S.C. section 360bbb-3(b)(1), unless the authorization is terminated or revoked.  Performed at [facility], [address] [place], [place] [address]    US Abdomen Limited RUQ (LIVER/GB)  Result Date: 05/10/2020 CLINICAL DATA:  Right upper quadrant abdominal pain. EXAM: ULTRASOUND ABDOMEN LIMITED RIGHT UPPER QUADRANT COMPARISON:  April 28, 2020. FINDINGS: Gallbladder: Cholelithiasis is noted without gallbladder wall thickening or pericholecystic fluid. No sonographic Murphy's sign is noted. Common bile duct: Diameter: 3 mm which is within normal limits. Liver: No focal lesion identified. Increased echogenicity of hepatic parenchyma is noted most consistent with hepatic steatosis. Portal vein is patent on color Doppler imaging with normal direction of blood flow towards the liver. Other: None. IMPRESSION: Cholelithiasis without evidence of cholecystitis. Probable hepatic steatosis. Electronically Signed   By: Marijo Conception M.D.   On: 05/10/2020 08:51      Assessment/Plan Asthma Hx covid pneumonia 08/2019 Nocturnal hypoxia on 2L O2 at night Hypothyroidism HLD DM Obesity BMI  42 Anxiety Thrombocytopenia GERD - Patient does report that she has a hx of taken ibuprofen TID for the last 3 years for chronic back pain. She was recently referred to GI by her PCP. She is already on omeprazole at home.    Symptomatic cholelithiasis - Patient with gallstones on imaging but no signs of cholecystitis, WBC WNL and LFTs essentially normal. Persistent abdominal pain despite analgesics. Will admit to observation and plan for laparoscopic cholecystectomy later today.  ID - Rocephin VTE - SCDs, lovenox FEN - IVF, NPO Foley - none Follow up - TBD  Jillyn Ledger, [facility] Surgery 05/10/2020, 12:27 PM Please see Amion for pager number during day hours 7:00am-4:30pm

## 2020-05-10 NOTE — ED Notes (Signed)
Report given to [facility] at [facility].

## 2020-05-10 NOTE — ED Provider Notes (Signed)
[facility] EMERGENCY DEPARTMENT Provider Note   CSN: [id number] Arrival date & time: 05/10/20  7902     History No chief complaint on file.   Teresa Castaneda is a 50 y.o. female.  She has a history of diabetes.  Complaining of right upper quadrant abdominal pain 5 out of 10 intensity at its been there for 3 weeks.  Worse with eating.  Associated with nausea and diarrhea.  Nothing makes it better.  Had an outpatient ultrasound that showed gallstones.  Chronic shortness of breath due to asthma.  Intermittent sharp stabbing chest pains, "Its not my heart."  No fever  The history is provided by the patient.  Abdominal Pain Pain location:  RUQ Pain quality: aching   Pain radiates to:  Does not radiate Pain severity:  Moderate Onset quality:  Gradual Duration:  3 weeks Timing:  Intermittent Progression:  Worsening Chronicity:  New Context: not trauma   Relieved by:  Nothing Worsened by:  Eating Ineffective treatments:  Palpation Associated symptoms: chest pain, diarrhea, nausea and shortness of breath   Associated symptoms: no constipation, no cough, no dysuria, no fever, no hematemesis, no hematochezia, no hematuria, no sore throat and no vomiting        Past Medical History:  Diagnosis Date  . Allergies   . Arthritis   . Asthma   . Back pain    Spinal Issue   . Diabetes mellitus without complication ([place])   . DJD (degenerative joint disease), lumbar   . Thyroid disease     Patient Active Problem List   Diagnosis Date Noted  . Acquired hypothyroidism 04/05/2020  . Weight gain 04/05/2020  . Type 2 diabetes mellitus with hyperglycemia, without long-term current use of insulin ([place]) 04/05/2020  . History of COVID-19 09/23/2019  . Pneumonia due to COVID-19 virus 09/23/2019  . Cough 09/23/2019  . BMI 40.0-44.9, adult ([place]) 07/22/2019  . Facet arthropathy, lumbar 07/22/2019  . Sacroiliitis ([place]) 07/22/2019  . ANXIETY 09/10/2006  . DEPRESSION 09/10/2006  .  ASTHMA 09/10/2006  . HEADACHE 09/10/2006    Past Surgical History:  Procedure Laterality Date  . ABDOMINAL HYSTERECTOMY       OB History   No obstetric history on file.     Family History  Problem Relation Age of Onset  . Coronary artery disease Mother   . Diabetes Mother   . COPD Mother   . Coronary artery disease Father   . Hypertension Father     Social History   Tobacco Use  . Smoking status: Former Smoker    Packs/day: 0.50    Types: Cigarettes    Start date: 01/16/1985    Quit date: 10/08/2019    Years since quitting: 0.5  . Smokeless tobacco: Never Used  Substance Use Topics  . Alcohol use: No  . Drug use: No    Home Medications Prior to Admission medications   Medication Sig Start Date End Date Taking? Authorizing Provider  albuterol (VENTOLIN HFA) 108 (90 Base) MCG/ACT inhaler Inhale 2 puffs into the lungs every 6 (six) hours as needed. For shortness of breath and wheezing 09/01/19   Ok Edwards, PA-C  ARMOUR THYROID 180 MG tablet Take 180 mg by mouth daily. 09/07/19   [provider]  atorvastatin (LIPITOR) 10 MG tablet Take 1 tablet (10 mg total) by mouth daily. 12/17/19   Jettie Booze, MD  Azelastine-Fluticasone [facility]) 137-50 MCG/ACT SUSP Place 1 spray into the nose in the morning and at  bedtime. 03/10/20   Just, Laurita Quint, FNP  escitalopram (LEXAPRO) 10 MG tablet Take 1 tablet (10 mg total) by mouth daily. 02/12/20   Wendie Agreste, MD  Fluticasone-Umeclidin-Vilant (TRELEGY ELLIPTA) 100-62.5-25 MCG/INH AEPB Inhale 1 puff into the lungs daily. 10/13/19   Freddi Starr, MD  furosemide (LASIX) 20 MG tablet Take 0.5 tablets (10 mg total) by mouth daily. 11/17/19   Freddi Starr, MD  ibuprofen (ADVIL) 600 MG tablet Take 1 tablet (600 mg total) by mouth every 6 (six) hours as needed. 12/26/19   Lamptey, Myrene Galas, MD  levocetirizine (XYZAL) 5 MG tablet Take 5 mg by mouth every evening.    [provider]  LORazepam (ATIVAN) 1 MG  tablet Take 1 mg by mouth every 8 (eight) hours as needed for anxiety.    [provider]  metFORMIN (GLUCOPHAGE) 1000 MG tablet Take 1 tablet (1,000 mg total) by mouth 2 (two) times daily with a meal. 03/25/20   Wendie Agreste, MD  montelukast (SINGULAIR) 10 MG tablet Take 10 mg by mouth at bedtime.    [provider]  omeprazole (PRILOSEC) 40 MG capsule Take 40 mg by mouth daily. 09/25/19   [provider]  ondansetron (ZOFRAN) 4 MG tablet Take 1 tablet (4 mg total) by mouth every 8 (eight) hours as needed for nausea or vomiting. 04/28/20   Maximiano Coss, NP  pseudoephedrine (SUDAFED 12 HOUR) 120 MG 12 hr tablet Take 1 tablet (120 mg total) by mouth 2 (two) times daily. 03/10/20   Just, Laurita Quint, FNP  thyroid (ARMOUR THYROID) 15 MG tablet Take 1 tablet (15 mg total) by mouth daily. For total dose 14m qd. 02/14/20   GWendie Agreste MD  azelastine (ASTELIN) 0.1 % nasal spray Place 2 sprays into both nostrils 2 (two) times daily. 04/23/19 12/26/19  [name] PA-C    Allergies    Clarithromycin and Codeine  Review of Systems   Review of Systems  Constitutional: Negative for fever.  HENT: Negative for sore throat.   Eyes: Negative for visual disturbance.  Respiratory: Positive for shortness of breath. Negative for cough.   Cardiovascular: Positive for chest pain.  Gastrointestinal: Positive for abdominal pain, diarrhea and nausea. Negative for constipation, hematemesis, hematochezia and vomiting.  Genitourinary: Negative for dysuria and hematuria.  Musculoskeletal: Negative for neck pain.  Skin: Negative for rash.  Neurological: Negative for headaches.    Physical Exam Updated Vital Signs BP 126/74 (BP Location: Right Arm)   Temp 98.3 F (36.8 C) (Oral)   Resp 16   Ht 5' 6"  (1.676 m)   Wt 117.9 kg   SpO2 96%   BMI 41.97 kg/m   Physical Exam Vitals and nursing note reviewed.  Constitutional:      General: She is not in acute distress.     Appearance: Normal appearance. She is well-developed.  HENT:     Head: Normocephalic and atraumatic.  Eyes:     Conjunctiva/sclera: Conjunctivae normal.  Cardiovascular:     Rate and Rhythm: Normal rate and regular rhythm.     Heart sounds: No murmur heard.   Pulmonary:     Effort: Pulmonary effort is normal. No respiratory distress.     Breath sounds: Normal breath sounds.  Abdominal:     Palpations: Abdomen is soft.     Tenderness: There is abdominal tenderness (ruq). There is no guarding or rebound.  Musculoskeletal:        General: No deformity or signs  of injury. Normal range of motion.     Cervical back: Neck supple.  Skin:    General: Skin is warm and dry.  Neurological:     General: No focal deficit present.     Mental Status: She is alert.     ED Results / Procedures / Treatments   Labs (all labs ordered are listed, but only abnormal results are displayed) Labs Reviewed  CBC WITH DIFFERENTIAL/PLATELET - Abnormal; Notable for the following components:      Result Value   Platelets 126 (*)    All other components within normal limits  COMPREHENSIVE METABOLIC PANEL - Abnormal; Notable for the following components:   Glucose, Bld 216 (*)    Albumin 3.2 (*)    AST 47 (*)    All other components within normal limits  RESP PANEL BY RT-PCR (FLU A&B, COVID) ARPGX2  LIPASE, BLOOD  URINALYSIS, ROUTINE W REFLEX MICROSCOPIC    EKG None  Radiology US Abdomen Limited RUQ (LIVER/GB)  Result Date: 05/10/2020 CLINICAL DATA:  Right upper quadrant abdominal pain. EXAM: ULTRASOUND ABDOMEN LIMITED RIGHT UPPER QUADRANT COMPARISON:  April 28, 2020. FINDINGS: Gallbladder: Cholelithiasis is noted without gallbladder wall thickening or pericholecystic fluid. No sonographic Murphy's sign is noted. Common bile duct: Diameter: 3 mm which is within normal limits. Liver: No focal lesion identified. Increased echogenicity of hepatic parenchyma is noted most consistent with hepatic  steatosis. Portal vein is patent on color Doppler imaging with normal direction of blood flow towards the liver. Other: None. IMPRESSION: Cholelithiasis without evidence of cholecystitis. Probable hepatic steatosis. Electronically Signed   By: Marijo Conception M.D.   On: 05/10/2020 08:51    Procedures Procedures   Medications Ordered in ED Medications  fentaNYL (SUBLIMAZE) injection 100 mcg (has no administration in time range)  ondansetron (ZOFRAN) injection 4 mg (has no administration in time range)  sodium chloride 0.9 % bolus 500 mL (has no administration in time range)    ED Course  I have reviewed the triage vital signs and the nursing notes.  Pertinent labs & imaging results that were available during my care of the patient were reviewed by me and considered in my medical decision making (see chart for details).  Clinical Course as of 05/10/20 1031  Mon May 10, 2020  0828 Normal white count, platelets low similar to priors [MB]  8469 Discussed with general surgery Dr. Thermon Leyland -he was in the OR and we discussed via the scrub nurse.  He said to send the patient to the ED unless we thought the office was appropriate.  Patient states she has had 3 weeks of constant pain and would like to be seen in the ED for possible operation.  Discussed with Dr. Maryan Rued ED physician who accepts the patient in transfer. [MB]    Clinical Course User Index [MB] Hayden Rasmussen, MD   MDM Rules/Calculators/A&P                         This patient complains of right upper quadrant abdominal pain; this involves an extensive number of treatment Options and is a complaint that carries with it a high risk of complications and Morbidity. The differential includes biliary colic, cholecystitis, gastritis, peptic ulcer disease, hepatitis, colitis  I ordered, reviewed and interpreted labs, which included CBC with normal white count normal hemoglobin, platelets low stable from priors, CBC with elevated  glucose, AST mildly elevated, COVID testing negative, lipase normal I ordered medication  IV fluids IV pain and nausea medication I ordered imaging studies which included right upper quadrant ultrasound and I independently    visualized and interpreted imaging which showed cholelithiasis no cholecystitis Previous records obtained and reviewed in epic including prior ultrasound I consulted general surgery Dr. Thermon Leyland And discussed lab and imaging findings  Critical Interventions: None  After the interventions stated above, I reevaluated the patient and found patient to be hemodynamically stable.  Offered her outpatient follow-up versus general surgery consultation at [facility].  Patient elects to go to [facility] for evaluation by general surgery.   Final Clinical Impression(s) / ED Diagnoses Final diagnoses:  RUQ abdominal pain  Symptomatic cholelithiasis  Diarrhea, unspecified type    Rx / DC Orders ED Discharge Orders    None       Hayden Rasmussen, MD 05/10/20 1035

## 2020-05-10 NOTE — ED Notes (Signed)
Received the patient from [facility].  The patient is alert and oriented.  Awaiting surgical consult.  Patient is made aware of NPO status and the need for a urine speciment.

## 2020-05-11 ENCOUNTER — Encounter (HOSPITAL_COMMUNITY): Payer: Self-pay | Admitting: Surgery

## 2020-05-11 ENCOUNTER — Telehealth: Payer: Self-pay | Admitting: Family Medicine

## 2020-05-11 ENCOUNTER — Encounter: Payer: Self-pay | Admitting: Gastroenterology

## 2020-05-11 ENCOUNTER — Other Ambulatory Visit (INDEPENDENT_AMBULATORY_CARE_PROVIDER_SITE_OTHER): Payer: Self-pay

## 2020-05-11 DIAGNOSIS — J329 Chronic sinusitis, unspecified: Secondary | ICD-10-CM

## 2020-05-11 NOTE — Progress Notes (Signed)
Ct scan

## 2020-05-11 NOTE — Telephone Encounter (Signed)
I do not see a specified time for follow-up on her discharge summary.  I think a week to 10 days would be reasonable.  As far as ibuprofen, I would recommend she discuss that with her surgical office as I do not see anything specific on her discharge summary other than tramadol.  Let me know if there are further questions.

## 2020-05-11 NOTE — Telephone Encounter (Signed)
Pt called in stating that she has gallbladder surgery yesterday. She wanted to know when she needs to F/UP with Dr. Carlota Raspberry or with Delfino Lovett. She also wanted to know could she take ibuprofen she said that she isn't suppose to take tylenol.   Please advise   Pt can be reached at the home #

## 2020-05-11 NOTE — Discharge Summary (Signed)
Patient ID: Teresa Castaneda [id number] 29-Jun-1970 50 y.o.  Admit date: 05/10/2020 Discharge date: 05/10/2020  Admitting Diagnosis: Symptomatic cholelithiasis  Discharge Diagnosis Cholecystitis Liver Cirrhosis  Patient Active Problem List   Diagnosis Date Noted  . Symptomatic cholelithiasis 05/10/2020  . Acquired hypothyroidism 04/05/2020  . Weight gain 04/05/2020  . Type 2 diabetes mellitus with hyperglycemia, without long-term current use of insulin ([place]) 04/05/2020  . History of COVID-19 09/23/2019  . Pneumonia due to COVID-19 virus 09/23/2019  . Cough 09/23/2019  . BMI 40.0-44.9, adult ([place]) 07/22/2019  . Facet arthropathy, lumbar 07/22/2019  . Sacroiliitis ([place]) 07/22/2019  . ANXIETY 09/10/2006  . DEPRESSION 09/10/2006  . ASTHMA 09/10/2006  . HEADACHE 09/10/2006    Consultants None   H&P: Teresa Castaneda is a 50yo female PMH asthma, hx covid pneumonia 08/2019, nocturnal hypoxia on 2L O2 at night, hypothyroidism, HLD, GERD, DM, obesity BMI 42, and anxiety who was transferred from [facility] to [facility] earlier today complaining of RUQ pain. States that the pain began about 3 weeks ago. It has gradually gotten more severe. Worse with PO intake, especially fried food. Associated with nausea vomiting and diarrhea. Denies fever or chills.   In the ED her vital signs were found to be stable. WBC 6.5, LFTs and lipase essentially normal. U/s shows Cholelithiasis without evidence of cholecystitis. General surgery asked to see.  Abdominal surgical history: hysterectomy Anticoagulants: none Former smoker Denies alcohol or illicit drug use Employment: [employer]   Procedures Dr. Thermon Leyland - Laparoscopic Cholecystectomy with IOC, Liver Bx - 05/10/2020  Hospital Course:  The patient was admitted and underwent a laparoscopic cholecystectomy with IOC. IOC with patent biliary system. The patient tolerated the procedure well.  Patient was doing well  in PACU with pain well controlled and felt stable for discharge home.  The patient was stable for DC home at this time with appropriate follow up made.   Allergies as of 05/10/2020      Reactions   Clarithromycin Other (See Comments)   Body cramps    Codeine Nausea And Vomiting      Medication List    STOP taking these medications   Advil Cold/Sinus 30-200 MG Tabs Generic drug: Pseudoephedrine-Ibuprofen   furosemide 20 MG tablet Commonly known as: Lasix   ibuprofen 600 MG tablet Commonly known as: ADVIL   montelukast 10 MG tablet Commonly known as: SINGULAIR   pseudoephedrine 120 MG 12 hr tablet Commonly known as: Sudafed 12 Hour     TAKE these medications   albuterol 108 (90 Base) MCG/ACT inhaler Commonly known as: VENTOLIN HFA Inhale 2 puffs into the lungs every 6 (six) hours as needed. For shortness of breath and wheezing   amoxicillin-clavulanate 875-125 MG tablet Commonly known as: AUGMENTIN Take 1 tablet by mouth 2 (two) times daily. 14 DS   Armour Thyroid 180 MG tablet Generic drug: thyroid Take 180 mg by mouth daily. What changed: Another medication with the same name was removed. Continue taking this medication, and follow the directions you see here.   atorvastatin 10 MG tablet Commonly known as: LIPITOR Take 1 tablet (10 mg total) by mouth daily.   Azelastine-Fluticasone 137-50 MCG/ACT Susp Commonly known as: Dymista Place 1 spray into the nose in the morning and at bedtime.   escitalopram 10 MG tablet Commonly known as: Lexapro Take 1 tablet (10 mg total) by mouth daily.   levocetirizine 5 MG tablet Commonly known as: XYZAL Take 5 mg by mouth  every evening.   LORazepam 1 MG tablet Commonly known as: ATIVAN Take 1 mg by mouth every 8 (eight) hours as needed for anxiety.   metFORMIN 1000 MG tablet Commonly known as: GLUCOPHAGE Take 1 tablet (1,000 mg total) by mouth 2 (two) times daily with a meal. What changed: how much to take   omeprazole  40 MG capsule Commonly known as: PRILOSEC Take 40 mg by mouth daily.   ondansetron 4 MG tablet Commonly known as: ZOFRAN Take 1 tablet (4 mg total) by mouth every 6 (six) hours. What changed:   when to take this  reasons to take this   traMADol 50 MG tablet Commonly known as: Ultram Take 1 tablet (50 mg total) by mouth every 6 (six) hours as needed.   Trelegy Ellipta 100-62.5-25 MCG/INH Aepb Generic drug: Fluticasone-Umeclidin-Vilant Inhale 1 puff into the lungs daily.         Follow-up Information    Surgery, [place]. Go on 06/01/2020.   Specialty: General Surgery Why: 06/01/20 at 8:45 AM. Please arrive 30 minutes prior to your appointment for paperwork. Please bring a copy of your photo ID and insurance card.  Contact information: [address] [place]  [address] [phone]        Wendie Agreste, MD.   Specialties: Family Medicine, Sports Medicine Contact information: [place] [address] [phone]               Signed: Alferd Apa, [facility] 05/11/2020, 7:48 AM Please see Amion for pager number during day hours 7:00am-4:30pm

## 2020-05-13 NOTE — Telephone Encounter (Signed)
Called pt and discussed recommendation and pt reports she wants to wait on follow up she is monitoring sugars and will call if needs to schedule

## 2020-05-20 ENCOUNTER — Other Ambulatory Visit: Payer: Self-pay

## 2020-05-20 ENCOUNTER — Ambulatory Visit
Admission: RE | Admit: 2020-05-20 | Discharge: 2020-05-20 | Disposition: A | Discharge status: Home / Self Care | Payer: Commercial Managed Care - PPO | Source: Ambulatory Visit | Attending: Otolaryngology | Admitting: Otolaryngology

## 2020-05-20 DIAGNOSIS — J329 Chronic sinusitis, unspecified: Secondary | ICD-10-CM

## 2020-06-02 ENCOUNTER — Other Ambulatory Visit: Payer: Self-pay

## 2020-06-02 ENCOUNTER — Ambulatory Visit (INDEPENDENT_AMBULATORY_CARE_PROVIDER_SITE_OTHER): Payer: Commercial Managed Care - PPO | Admitting: Gastroenterology

## 2020-06-02 ENCOUNTER — Other Ambulatory Visit (INDEPENDENT_AMBULATORY_CARE_PROVIDER_SITE_OTHER): Payer: Commercial Managed Care - PPO

## 2020-06-02 ENCOUNTER — Encounter: Payer: Self-pay | Admitting: Gastroenterology

## 2020-06-02 VITALS — BP 110/74 | HR 91 | Ht 66.0 in | Wt 265.0 lb

## 2020-06-02 DIAGNOSIS — R1011 Right upper quadrant pain: Secondary | ICD-10-CM

## 2020-06-02 DIAGNOSIS — K580 Irritable bowel syndrome with diarrhea: Secondary | ICD-10-CM | POA: Diagnosis not present

## 2020-06-02 DIAGNOSIS — K219 Gastro-esophageal reflux disease without esophagitis: Secondary | ICD-10-CM

## 2020-06-02 DIAGNOSIS — K7581 Nonalcoholic steatohepatitis (NASH): Secondary | ICD-10-CM

## 2020-06-02 LAB — COMPREHENSIVE METABOLIC PANEL
ALT: 35 U/L (ref 0–35)
AST: 43 U/L — ABNORMAL HIGH (ref 0–37)
Albumin: 3.5 g/dL (ref 3.5–5.2)
Alkaline Phosphatase: 88 U/L (ref 39–117)
BUN: 6 mg/dL (ref 6–23)
CO2: 24 mEq/L (ref 19–32)
Calcium: 8.7 mg/dL (ref 8.4–10.5)
Chloride: 103 mEq/L (ref 96–112)
Creatinine, Ser: 0.44 mg/dL (ref 0.40–1.20)
GFR: 114.31 mL/min (ref >60.00)
Glucose, Bld: 170 mg/dL — ABNORMAL HIGH (ref 70–99)
Potassium: 4.1 mEq/L (ref 3.5–5.1)
Sodium: 136 mEq/L (ref 135–145)
Total Bilirubin: 1 mg/dL (ref 0.2–1.2)
Total Protein: 6.9 g/dL (ref 6.0–8.3)

## 2020-06-02 LAB — CBC WITH DIFFERENTIAL/PLATELET
Basophils Absolute: 0.1 10*3/uL (ref 0.0–0.1)
Basophils Relative: 1.1 % (ref 0.0–3.0)
Eosinophils Absolute: 0.4 10*3/uL (ref 0.0–0.7)
Eosinophils Relative: 5.4 % — ABNORMAL HIGH (ref 0.0–5.0)
HCT: 39 % (ref 36.0–46.0)
Hemoglobin: 13.1 g/dL (ref 12.0–15.0)
Lymphocytes Relative: 36.6 % (ref 12.0–46.0)
Lymphs Abs: 2.6 10*3/uL (ref 0.7–4.0)
MCHC: 33.6 g/dL (ref 30.0–36.0)
MCV: 83.4 fl (ref 78.0–100.0)
Monocytes Absolute: 0.4 10*3/uL (ref 0.1–1.0)
Monocytes Relative: 5.1 % (ref 3.0–12.0)
Neutro Abs: 3.7 10*3/uL (ref 1.4–7.7)
Neutrophils Relative %: 51.8 % (ref 43.0–77.0)
Platelets: 121 10*3/uL — ABNORMAL LOW (ref 150.0–400.0)
RBC: 4.68 Mil/uL (ref 3.87–5.11)
RDW: 14.4 % (ref 11.5–15.5)
WBC: 7.1 10*3/uL (ref 4.0–10.5)

## 2020-06-02 LAB — PROTIME-INR
INR: 1.2 ratio — ABNORMAL HIGH (ref 0.8–1.0)
Prothrombin Time: 13.4 s — ABNORMAL HIGH (ref 9.6–13.1)

## 2020-06-02 LAB — AMMONIA: Ammonia: 55 umol/L — ABNORMAL HIGH (ref 11–35)

## 2020-06-02 LAB — GAMMA GT: GGT: 72 U/L — ABNORMAL HIGH (ref 7–51)

## 2020-06-02 NOTE — Progress Notes (Signed)
Chief Complaint: Abnormal liver bx  Referring Provider:  Wendie Agreste, MD      ASSESSMENT AND PLAN;   #1. GERD with occ RUQ pain   #2. IBS-D  #3. 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 Neg acute hep panel. No ETOH.  #4. H/O biliary colic s/p lap chole with neg IOC and liver Bx 05/10/2020.  Plan: -Weight loss -Weight loss -Weight loss. -Start exercising like walking 30 min/day.  Then increase gradually. -Record wt every day and bring it along the at the next FU visit. -Change diet to more grilled foods rather than fried foods. Can try Mediterranean diet. -Stop drinking sodas.  She has been trying.  Avoid foods with high fructose corn syrup. -Check CBC, CMP, autoimmune hepatitis panel (AMA, ASMA), ANA, iron studies, S. ceruloplasmin, A1AT, celiac screen, and GGT. -Check ammonia, AFP and PT INR. -Check anti-HAV total Ab and HBsAb.  If neg, would recommend vaccination for Hep A/B. -EGD/colon for eval of EV/colorectal cancer screening. -Can have up to 2 cups of coffee per day. -Vitamin E 400 IU po QD. -Best possible control of DM and hypercholesterolemia. -FU in 12 weeks.  Aim is to reduce 6 lbs over next 12 weeks. At FU, would require CT Abdo/pelvis as well. If unable, would refer her to weight loss clinic. -Low-salt diet.     HPI:    Teresa Castaneda is a 50 y.o. female  With DM, obesity, DJD, HLD, anxiety  Had Biliary colic (RUQ pain) , underwent lap chole with neg IOC and liver Bx 05/10/2020. Liver Bx reviewed at [facility] showed steatohepatitis with stage IV cirrhosis.  She has H/O low platelets x about a year.  No nausea, vomiting, regurgitation, odynophagia or dysphagia. She would normally have diarrhea- has 2-3 softer BMs per day, worst after meals without nocturnal symptoms for several years.  This is associated with some lower abdominal bloating and crampy pain which gets better with defecation.  No constipation. No melena or hematochezia. No  unintentional weight loss. No abdominal pain currently.  She does give history of heartburn and indigestion.  Still has minimal RUQ abdo pain even after lap chole.  She does have FH of NASH cirrhosis (mom).  Unfortunately, her mom has passed away due to complications from diabetes.  H/O itching,   No skin lesions, easy bruisability, intake of OTC meds including diet pills, herbal medications, anabolic steroids or Tylenol. There is no H/O blood transfusions, IVDA. No jaundice, dark urine or pale stools. No alcohol abuse.  No chocolates, chewing gums, artificial sweeteners and candy. No NSAIDs.  She would drink 4 Dr. Samson Frederic per day.  Has reduced it to 2/day.  Additional GI history: Op note from lap chole 05/10/2020-reviewed Louanna Raw, MD General, Bariatric, & Minimally Invasive Surgery [facility], [place]) -Cirrhotic appearing liver. Bx-as above. -IOC negative -No ascites or any obvious varices  Past Medical History:  Diagnosis Date  . Allergies   . Anxiety   . Arthritis   . Asthma   . Back pain    Spinal Issue   . Diabetes mellitus without complication ([place])   . DJD (degenerative joint disease), lumbar   . Elevated cholesterol   . Gallstone   . Obesity   . Thyroid disease     Past Surgical History:  Procedure Laterality Date  . ABDOMINAL HYSTERECTOMY    . CHOLECYSTECTOMY N/A 05/10/2020   Procedure: LAPAROSCOPIC CHOLECYSTECTOMY WITH INTRAOPERATIVE CHOLANGIOGRAM AND LIVER BIOPSY;  Surgeon: Felicie Morn,  MD;  Location: [facility] ORS;  Service: General;  Laterality: N/A;  . LIVER BIOPSY      Family History  Problem Relation Age of Onset  . Coronary artery disease Mother   . Diabetes Mother   . COPD Mother   . Coronary artery disease Father   . Hypertension Father   . Colon cancer Neg Hx   . Esophageal cancer Neg Hx     Social History   Tobacco Use  . Smoking status: Former Smoker    Packs/day: 0.50    Types: Cigarettes    Start date: 01/16/1985     Quit date: 10/08/2019    Years since quitting: 0.6  . Smokeless tobacco: Never Used  Vaping Use  . Vaping Use: Never used  Substance Use Topics  . Alcohol use: No  . Drug use: No    Current Outpatient Medications  Medication Sig Dispense Refill  . albuterol (VENTOLIN HFA) 108 (90 Base) MCG/ACT inhaler Inhale 2 puffs into the lungs every 6 (six) hours as needed. For shortness of breath and wheezing 18 g 0  . ARMOUR THYROID 180 MG tablet Take 180 mg by mouth daily.    Marland Kitchen atorvastatin (LIPITOR) 10 MG tablet Take 1 tablet (10 mg total) by mouth daily. 90 tablet 3  . Azelastine-Fluticasone (DYMISTA) 137-50 MCG/ACT SUSP Place 1 spray into the nose in the morning and at bedtime. 23 g 6  . escitalopram (LEXAPRO) 10 MG tablet Take 1 tablet (10 mg total) by mouth daily. 30 tablet 2  . Fluticasone-Umeclidin-Vilant (TRELEGY ELLIPTA) 100-62.5-25 MCG/INH AEPB Inhale 1 puff into the lungs daily. 1 each 6  . ibuprofen (ADVIL) 600 MG tablet Take 600 mg by mouth as needed.    Marland Kitchen levocetirizine (XYZAL) 5 MG tablet Take 5 mg by mouth every evening.    Marland Kitchen LORazepam (ATIVAN) 1 MG tablet Take 1 mg by mouth every 8 (eight) hours as needed for anxiety.    . metFORMIN (GLUCOPHAGE) 1000 MG tablet Take 1 tablet (1,000 mg total) by mouth 2 (two) times daily with a meal. (Patient taking differently: Take 500 mg by mouth 2 (two) times daily with a meal.) 180 tablet 3  . omeprazole (PRILOSEC) 40 MG capsule Take 40 mg by mouth daily.    . ondansetron (ZOFRAN) 4 MG tablet Take 1 tablet (4 mg total) by mouth every 6 (six) hours. 12 tablet 0  . traMADol (ULTRAM) 50 MG tablet Take 1 tablet (50 mg total) by mouth every 6 (six) hours as needed. 20 tablet 0   No current facility-administered medications for this visit.    Allergies  Allergen Reactions  . Clarithromycin Other (See Comments)    Body cramps   . Codeine Nausea And Vomiting    Review of Systems:  Constitutional: Denies fever, chills, diaphoresis, appetite  change and has fatigue.  HEENT: Has allergies Respiratory: Denies SOB, DOE, has occ cough, No chest tightness,  and wheezing.   Cardiovascular: Denies chest pain, palpitations and leg swelling.  Genitourinary: Denies dysuria, urgency, frequency, hematuria, flank pain and difficulty urinating.  Musculoskeletal: Denies myalgias, has back pain, No joint swelling, arthralgias and gait problem.  Skin: No rash.  Neurological: Denies dizziness, seizures, syncope, weakness, light-headedness, numbness and has headaches.  Hematological: Denies adenopathy. Easy bruising, personal or family bleeding history  Psychiatric/Behavioral: No anxiety or depression. Has sleeping problems     Physical Exam:    BP 110/74   Pulse 91   Ht 5' 6"  (1.676 m)  Wt 265 lb (120.2 kg)   BMI 42.77 kg/m  Wt Readings from Last 3 Encounters:  06/02/20 265 lb (120.2 kg)  05/10/20 260 lb (117.9 kg)  04/05/20 262 lb (118.8 kg)   Constitutional:  Well-developed, in no acute distress. Psychiatric: Normal mood and affect. Behavior is normal. HEENT: Pupils normal.  Conjunctivae are normal. No scleral icterus. Neck supple.  Cardiovascular: Normal rate, regular rhythm. No edema Pulmonary/chest: Effort normal and breath sounds normal. No wheezing, rales or rhonchi. Abdominal: Soft, nondistended. Nontender. Bowel sounds active throughout. There are no masses palpable. No hepatomegaly.  No ascites. Rectal: Deferred Neurological: Alert and oriented to person place and time. Skin: Skin is warm and dry. No rashes noted. Extremities-no edema  Data Reviewed: I have personally reviewed following labs and imaging studies  CBC: CBC Latest Ref Rng & Units 05/10/2020 05/04/2020 03/25/2020  WBC 4.0 - 10.5 K/uL 6.5 5.7 7.8  Hemoglobin 12.0 - 15.0 g/dL 13.7 13.3 13.7  Hematocrit 36.0 - 46.0 % 41.3 40.1 39.9  Platelets 150 - 400 K/uL 126(L) 123.0(L) 124(L)    CMP: CMP Latest Ref Rng & Units 05/10/2020 05/04/2020 01/28/2020  Glucose 70  - 99 mg/dL 216(H) 237(H) -  BUN 6 - 20 mg/dL 8 7 -  Creatinine 0.44 - 1.00 mg/dL 0.51 0.50 -  Sodium 135 - 145 mmol/L 135 133(L) -  Potassium 3.5 - 5.1 mmol/L 3.9 4.5 -  Chloride 98 - 111 mmol/L 101 101 -  CO2 22 - 32 mmol/L 24 26 -  Calcium 8.9 - 10.3 mg/dL 9.0 9.0 -  Total Protein 6.5 - 8.1 g/dL 7.4 7.1 7.2  Total Bilirubin 0.3 - 1.2 mg/dL 0.8 0.8 0.6  Alkaline Phos 38 - 126 U/L 82 83 100  AST 15 - 41 U/L 47(H) 47(H) 72(H)  ALT 0 - 44 U/L 41 39(H) 82(H)     Radiology Studies: DG Cholangiogram Operative  Result Date: 05/10/2020 CLINICAL DATA:  Cholelithiasis EXAM: INTRAOPERATIVE CHOLANGIOGRAM TECHNIQUE: Cholangiographic images from the C-arm fluoroscopic device were submitted for interpretation post-operatively. Please see the procedural report for the amount of contrast and the fluoroscopy time utilized. COMPARISON:  05/10/2020 ultrasound FINDINGS: Intraoperative cholangiogram performed during laparoscopic procedure. The cystic duct, biliary confluence, common hepatic duct, and common bile duct are all patent. Contrast easily drains into the duodenum. No dilatation, obstruction, stricture, or large filling defect. IMPRESSION: Patent biliary system. Electronically Signed   By: Jerilynn Mages.  Shick M.D.   On: 05/10/2020 15:50   CT Maxillofacial WO CM  Result Date: 05/21/2020 CLINICAL DATA:  Chronic sinusitis EXAM: CT MAXILLOFACIAL WITHOUT CONTRAST TECHNIQUE: Multidetector CT images of the paranasal sinuses were obtained using the standard protocol without intravenous contrast. COMPARISON:  None. FINDINGS: Paranasal sinuses: Frontal: Mild mucosal edema in the frontal sinus recess bilaterally. Ethmoid: Extensive mucosal edema bilaterally Maxillary: Extensive mucosal edema on the right with associated bony thickening. Mild mucosal edema left maxillary sinus. Sphenoid: Mild mucosal edema bilaterally. Mastoid: Clear bilaterally.  Middle ear clear. Right ostiomeatal unit: Occluded Left ostiomeatal unit:  Occluded Nasal passages: Patent. Mild septal deviation. Anatomy: Large concha bullosa left middle turbinate filled with fluid Keros type 2 olfactory recess Limited intracranial imaging negative Negative orbit bilaterally IMPRESSION: Mucosal edema throughout the paranasal sinuses with obstruction of the ostiomeatal complex bilaterally. No air-fluid level. Electronically Signed   By: Franchot Gallo M.D.   On: 05/21/2020 15:17   US Abdomen Limited RUQ (LIVER/GB)  Result Date: 05/10/2020 CLINICAL DATA:  Right upper quadrant abdominal pain. EXAM: ULTRASOUND ABDOMEN  LIMITED RIGHT UPPER QUADRANT COMPARISON:  April 28, 2020. FINDINGS: Gallbladder: Cholelithiasis is noted without gallbladder wall thickening or pericholecystic fluid. No sonographic Murphy's sign is noted. Common bile duct: Diameter: 3 mm which is within normal limits. Liver: No focal lesion identified. Increased echogenicity of hepatic parenchyma is noted most consistent with hepatic steatosis. Portal vein is patent on color Doppler imaging with normal direction of blood flow towards the liver. Other: None. IMPRESSION: Cholelithiasis without evidence of cholecystitis. Probable hepatic steatosis. Electronically Signed   By: Marijo Conception M.D.   On: 05/10/2020 08:51      Carmell Austria, MD 06/02/2020, 9:38 AM  Cc: Wendie Agreste, MD

## 2020-06-02 NOTE — Patient Instructions (Addendum)
If you are age 49 or older, your body mass index should be between 23-30. Your Body mass index is 42.77 kg/m. If this is out of the aforementioned range listed, please consider follow up with your Primary Care Provider.  If you are age 25 or younger, your body mass index should be between 19-25. Your Body mass index is 42.77 kg/m. If this is out of the aformentioned range listed, please consider follow up with your Primary Care Provider.   You have been scheduled for an endoscopy and colonoscopy. Please follow the written instructions given to you at your visit today. Please pick up your prep supplies at the pharmacy within the next 1-3 days. If you use inhalers (even only as needed), please bring them with you on the day of your procedure.  Please go to the lab on the 2nd floor [address] before you leave the office today.   Record weight daily. Please try to lose 6lbs in the next 12 weeks by exercising 30 minutes a day. Try a mediterranean diet (more grilled foods)  Can do vitamin e 400UI daily  Follow up in 12 weeks by call our office to schedule an appointment  Call with any questions or concerns.   Thank you,  Dr. Jackquline Denmark

## 2020-06-03 ENCOUNTER — Other Ambulatory Visit: Payer: Self-pay

## 2020-06-03 DIAGNOSIS — R772 Abnormality of alphafetoprotein: Secondary | ICD-10-CM

## 2020-06-03 DIAGNOSIS — K760 Fatty (change of) liver, not elsewhere classified: Secondary | ICD-10-CM

## 2020-06-03 NOTE — Progress Notes (Signed)
Please inform the patient. AFP 6.5 She is not immune to hepatitis A/B Celiac is pending Ammonia is slightly elevated Plan: -Triphasic CT liver (abdo) (RE: elevated AFP, R/O HCC) -Vaccine for hepatitis A/B -Also start rifaximin 550 mg p.o. twice daily.  Please get cards/coupons -Repeat AFP in 3 months. Send report to family physician

## 2020-06-04 ENCOUNTER — Other Ambulatory Visit: Payer: Self-pay

## 2020-06-04 LAB — CELIAC PANEL 10
Antigliadin Abs, IgA: 7 units (ref 0–19)
Endomysial IgA: NEGATIVE
Gliadin IgG: 2 units (ref 0–19)
IgA/Immunoglobulin A, Serum: 674 mg/dL — ABNORMAL HIGH (ref 87–352)
Tissue Transglut Ab: <2 U/mL (ref 0–5)
Transglutaminase IgA: <2 U/mL (ref 0–3)

## 2020-06-04 MED ORDER — RIFAXIMIN 550 MG PO TABS: 550.0000 mg | ORAL_TABLET | Freq: Two times a day (BID) | ORAL | 3 refills | Status: DC

## 2020-06-07 ENCOUNTER — Ambulatory Visit (INDEPENDENT_AMBULATORY_CARE_PROVIDER_SITE_OTHER)
Admission: RE | Admit: 2020-06-07 | Discharge: 2020-06-07 | Disposition: A | Discharge status: Home / Self Care | Payer: Commercial Managed Care - PPO | Source: Ambulatory Visit | Attending: Gastroenterology | Admitting: Gastroenterology

## 2020-06-07 ENCOUNTER — Other Ambulatory Visit: Payer: Self-pay

## 2020-06-07 ENCOUNTER — Ambulatory Visit (INDEPENDENT_AMBULATORY_CARE_PROVIDER_SITE_OTHER): Payer: Commercial Managed Care - PPO | Admitting: Otolaryngology

## 2020-06-07 DIAGNOSIS — K760 Fatty (change of) liver, not elsewhere classified: Secondary | ICD-10-CM

## 2020-06-07 DIAGNOSIS — J322 Chronic ethmoidal sinusitis: Secondary | ICD-10-CM | POA: Diagnosis not present

## 2020-06-07 DIAGNOSIS — J320 Chronic maxillary sinusitis: Secondary | ICD-10-CM

## 2020-06-07 LAB — IRON,TIBC AND FERRITIN PANEL
%SAT: 24 % (calc) (ref 16–45)
Ferritin: 16 ng/mL (ref 16–232)
Iron: 105 ug/dL (ref 40–190)
TIBC: 445 mcg/dL (calc) (ref 250–450)

## 2020-06-07 LAB — ALPHA-1-ANTITRYPSIN: A-1 Antitrypsin, Ser: 165 mg/dL (ref 83–199)

## 2020-06-07 LAB — HEPATITIS A ANTIBODY, TOTAL: Hepatitis A AB,Total: NONREACTIVE

## 2020-06-07 LAB — CERULOPLASMIN: Ceruloplasmin: 27 mg/dL (ref 18–53)

## 2020-06-07 LAB — ANA: Anti Nuclear Antibody (ANA): POSITIVE — AB

## 2020-06-07 LAB — HEPATITIS B SURFACE ANTIBODY,QUALITATIVE: Hep B S Ab: NONREACTIVE

## 2020-06-07 LAB — ANTI-NUCLEAR AB-TITER (ANA TITER): ANA Titer 1: 1:40 {titer} — ABNORMAL HIGH

## 2020-06-07 LAB — MITOCHONDRIAL ANTIBODIES: Mitochondrial M2 Ab, IgG: <=20 U

## 2020-06-07 LAB — AFP TUMOR MARKER: AFP-Tumor Marker: 6.5 ng/mL — ABNORMAL HIGH

## 2020-06-07 LAB — ANTI-SMOOTH MUSCLE ANTIBODY, IGG: Actin (Smooth Muscle) Antibody (IGG): <20 U (ref <20)

## 2020-06-07 MED ORDER — IOHEXOL 300 MG/ML  SOLN
100.0000 mL | Freq: Once | INTRAMUSCULAR | Status: AC | PRN
  Administered 2020-06-07: 100 mL via INTRAVENOUS

## 2020-06-07 NOTE — Progress Notes (Addendum)
HPI: Teresa Castaneda is a 50 y.o. female who returns today for evaluation of chronic sinus issues.  She just recently completed 2-week course of Augmentin and had a follow-up CT scan.  She presents today to review the results of the CT scan.  She has just intermittent nasal congestion that alternates from side to side.  But she complains of recurrent sinus infections where she gets pressure between her eyes. She was recently diagnosed with some liver problems and it presently having this worked up and is getting ready to take antibiotics for her liver according to the patient. I reviewed the CT scan with the patient in the office today..  This showed opacification of the ethmoid regions bilaterally with partial opacification of the maxillary sinus especially on the right side compared to the left.  The frontal sinuses are relatively clear although she has some congestion of the nasal frontal region.  The sphenoid sinuses were clear. She is presently using Nasacort and saline rinses.  She does better when she takes antibiotics but then the symptoms return.  Past Medical History:  Diagnosis Date  . Allergies   . Anxiety   . Arthritis   . Asthma   . Back pain    Spinal Issue   . Diabetes mellitus without complication ([place])   . DJD (degenerative joint disease), lumbar   . Elevated cholesterol   . Gallstone   . Obesity   . Thyroid disease    Past Surgical History:  Procedure Laterality Date  . ABDOMINAL HYSTERECTOMY  02/21/2003  . CHOLECYSTECTOMY N/A 05/10/2020   Procedure: LAPAROSCOPIC CHOLECYSTECTOMY WITH INTRAOPERATIVE CHOLANGIOGRAM AND LIVER BIOPSY;  Surgeon: Felicie Morn, MD;  Location: [facility];  Service: General;  Laterality: N/A;  . LIVER BIOPSY     Social History   Socioeconomic History  . Marital status: Single    Spouse name: Not on file  . Number of children: Not on file  . Years of education: Not on file  . Highest education level: Not on file  Occupational History   . Not on file  Tobacco Use  . Smoking status: Former Smoker    Packs/day: 0.50    Types: Cigarettes    Start date: 01/16/1985    Quit date: 10/08/2019    Years since quitting: 0.6  . Smokeless tobacco: Never Used  Vaping Use  . Vaping Use: Never used  Substance and Sexual Activity  . Alcohol use: No  . Drug use: No  . Sexual activity: Not on file  Other Topics Concern  . Not on file  Social History Narrative  . Not on file   Social Determinants of Health   Financial Resource Strain: Not on file  Food Insecurity: Not on file  Transportation Needs: Not on file  Physical Activity: Not on file  Stress: Not on file  Social Connections: Not on file   Family History  Problem Relation Age of Onset  . Coronary artery disease Mother   . Diabetes Mother   . COPD Mother   . Coronary artery disease Father   . Hypertension Father   . Colon cancer Neg Hx   . Esophageal cancer Neg Hx    Allergies  Allergen Reactions  . Clarithromycin Other (See Comments)    Body cramps   . Codeine Nausea And Vomiting   Prior to Admission medications   Medication Sig Start Date End Date Taking? Authorizing Provider  albuterol (VENTOLIN HFA) 108 (90 Base) MCG/ACT inhaler Inhale 2 puffs into  the lungs every 6 (six) hours as needed. For shortness of breath and wheezing 09/01/19   Ok Edwards, PA-C  ARMOUR THYROID 180 MG tablet Take 180 mg by mouth daily. 09/07/19   [provider]  atorvastatin (LIPITOR) 10 MG tablet Take 1 tablet (10 mg total) by mouth daily. 12/17/19   Jettie Booze, MD  Azelastine-Fluticasone [facility]) 137-50 MCG/ACT SUSP Place 1 spray into the nose in the morning and at bedtime. 03/10/20   Just, Laurita Quint, FNP  escitalopram (LEXAPRO) 10 MG tablet Take 1 tablet (10 mg total) by mouth daily. 02/12/20   Wendie Agreste, MD  Fluticasone-Umeclidin-Vilant (TRELEGY ELLIPTA) 100-62.5-25 MCG/INH AEPB Inhale 1 puff into the lungs daily. 10/13/19   Freddi Starr, MD  ibuprofen  (ADVIL) 600 MG tablet Take 600 mg by mouth as needed.    [provider]  levocetirizine (XYZAL) 5 MG tablet Take 5 mg by mouth every evening.    [provider]  LORazepam (ATIVAN) 1 MG tablet Take 1 mg by mouth every 8 (eight) hours as needed for anxiety.    [provider]  metFORMIN (GLUCOPHAGE) 1000 MG tablet Take 1 tablet (1,000 mg total) by mouth 2 (two) times daily with a meal. Patient taking differently: Take 500 mg by mouth 2 (two) times daily with a meal. 03/25/20   Wendie Agreste, MD  omeprazole (PRILOSEC) 40 MG capsule Take 40 mg by mouth daily. 09/25/19   [provider]  ondansetron (ZOFRAN) 4 MG tablet Take 1 tablet (4 mg total) by mouth every 6 (six) hours. 05/10/20   Maczis, Barth Kirks, PA-C  rifaximin (XIFAXAN) 550 MG TABS tablet Take 1 tablet (550 mg total) by mouth 2 (two) times daily. 06/04/20   Jackquline Denmark, MD  traMADol (ULTRAM) 50 MG tablet Take 1 tablet (50 mg total) by mouth every 6 (six) hours as needed. 05/10/20 05/10/21  Maczis, Barth Kirks, PA-C  azelastine (ASTELIN) 0.1 % nasal spray Place 2 sprays into both nostrils 2 (two) times daily. 04/23/19 12/26/19  [name], Amy V, PA-C     Positive ROS: Otherwise negative  All other systems have been reviewed and were otherwise negative with the exception of those mentioned in the HPI and as above.  Physical Exam: Constitutional: Alert, well-appearing, no acute distress Ears: External ears without lesions or tenderness. Ear canals are clear bilaterally with intact, clear TMs.  Nasal: External nose without lesions. Septum slightly deviated to the right.  The middle meatus regions were actually fairly clear today with no obvious mucopurulent discharge noted.  No polyps noted.. Oral: Lips and gums without lesions. Tongue and palate mucosa without lesions. Posterior oropharynx clear. Neck: No palpable adenopathy or masses Respiratory: Breathing comfortably  Skin: No facial/neck lesions or rash  noted.  She had an audiogram performed today that demonstrated normal hearing in the low and mid frequencies with a minimal high-frequency bilateral sensorineural hearing loss..  She had type A tympanograms bilaterally.  SRT's were 15 dB bilaterally.  Procedures  Assessment: Chronic ethmoid and maxillary sinus disease. History of recurrent acute sinus infections.  Plan: I discussed with her concerning surgical options to help improve the recurrent sinus issues she has had for years. She is interested in possibly having surgery but would like to have the liver problem cleared up first and I agree with this. In the meantime recommended regular use of the Nasacort 2 sprays each nostril at night as well as use of saline irrigations as needed and gave  her a sample of a Nettie pot to use. I also prescribed Biaxin 500 mg twice daily for 10 days if she develops any acute sinus issues as she has used this in the past with good success. If she elects to have surgery she will require fusion guided endoscopic sinus surgery with bilateral total ethmoidectomy and bilateral maxillary ostia enlargement along with inferior turbinate reductions. She will continue with medical therapy and call us back when she decides to have surgery performed.   Radene Journey, MD

## 2020-06-09 ENCOUNTER — Other Ambulatory Visit: Payer: Self-pay

## 2020-06-09 ENCOUNTER — Other Ambulatory Visit: Payer: Self-pay | Admitting: Family Medicine

## 2020-06-09 ENCOUNTER — Ambulatory Visit: Payer: Commercial Managed Care - PPO | Admitting: Family Medicine

## 2020-06-09 VITALS — BP 128/76 | HR 95 | Temp 98.2°F | Resp 16 | Ht 66.0 in | Wt 267.2 lb

## 2020-06-09 DIAGNOSIS — Z23 Encounter for immunization: Secondary | ICD-10-CM | POA: Diagnosis not present

## 2020-06-09 DIAGNOSIS — E1140 Type 2 diabetes mellitus with diabetic neuropathy, unspecified: Secondary | ICD-10-CM

## 2020-06-09 DIAGNOSIS — K219 Gastro-esophageal reflux disease without esophagitis: Secondary | ICD-10-CM

## 2020-06-09 DIAGNOSIS — K7581 Nonalcoholic steatohepatitis (NASH): Secondary | ICD-10-CM | POA: Diagnosis not present

## 2020-06-09 DIAGNOSIS — J4540 Moderate persistent asthma, uncomplicated: Secondary | ICD-10-CM

## 2020-06-09 DIAGNOSIS — I700 Atherosclerosis of aorta: Secondary | ICD-10-CM

## 2020-06-09 DIAGNOSIS — K7460 Unspecified cirrhosis of liver: Secondary | ICD-10-CM

## 2020-06-09 DIAGNOSIS — E1165 Type 2 diabetes mellitus with hyperglycemia: Secondary | ICD-10-CM | POA: Diagnosis not present

## 2020-06-09 LAB — GLUCOSE, POCT (MANUAL RESULT ENTRY): POC Glucose: 182 mg/dl — AB (ref 70–99)

## 2020-06-09 LAB — POCT GLYCOSYLATED HEMOGLOBIN (HGB A1C): Hemoglobin A1C: 8.1 % — AB (ref 4.0–5.6)

## 2020-06-09 MED ORDER — METFORMIN HCL 850 MG PO TABS: 850.0000 mg | ORAL_TABLET | Freq: Two times a day (BID) | ORAL | 3 refills | Status: DC

## 2020-06-09 MED ORDER — OMEPRAZOLE 40 MG PO CPDR: 40.0000 mg | DELAYED_RELEASE_CAPSULE | Freq: Every day | ORAL | 1 refills | Status: DC

## 2020-06-09 MED ORDER — ATORVASTATIN CALCIUM 10 MG PO TABS: 10.0000 mg | ORAL_TABLET | Freq: Every day | ORAL | 3 refills | Status: DC

## 2020-06-09 MED ORDER — TRELEGY ELLIPTA 100-62.5-25 MCG/INH IN AEPB: 1.0000 | INHALATION_SPRAY | Freq: Every day | RESPIRATORY_TRACT | 6 refills | Status: DC

## 2020-06-09 MED ORDER — GABAPENTIN 100 MG PO CAPS: 100.0000 mg | ORAL_CAPSULE | Freq: Three times a day (TID) | ORAL | 1 refills | Status: DC

## 2020-06-09 MED ORDER — ALBUTEROL SULFATE HFA 108 (90 BASE) MCG/ACT IN AERS: 2.0000 | INHALATION_SPRAY | Freq: Four times a day (QID) | RESPIRATORY_TRACT | 0 refills | Status: DC | PRN

## 2020-06-09 NOTE — Progress Notes (Signed)
Subjective:  Patient ID: Teresa Castaneda, female    DOB: [birth year]  Age: 50 y.o. MRN: [id number]  CC:  Chief Complaint  Patient presents with  . Diabetes    Pt in need of a few refills today but main concern is burning in the bottom of her feet especially when bare she describes this as a pins and needle feeling.   Marland Kitchen Referral    Pt has liver issues and Dr Lyndel Safe told her she needs Hep A/B vaccine and asked that we do this for her if not able then can return to GI office.  General check in about condition.   . Medication Refill    Pt also requesting a refill on her maintenance medications albuterol, lipator, flonase, and prilosec, notes she will make standart follow up given current concerns.     HPI Teresa Castaneda presents for   Diabetes: Complicated by hyperglycemia.  Last visit March.  Elevated A1c in January, metformin was increased from 500 mg daily to twice daily.  Home readings in the mid to high 100s at times.  Metformin was increased to 1000 mg twice daily with potential side effects discussed.- in office test lower - option of remaining on 541m BID - remained on 5076m No new side effects.  Has endocrinologist, but for treatment of thyroid- appt June 24th.  Home readings: Fasting: 200's  Postprandial: under 150. Lowest 128.  17-237 on most recent bloodwork since April.  Complains of some dysesthesia/burning pains in the bottom of her feet. Noticed past month.  Microalbumin: normal ratio in January  Optho, foot exam, pneumovax:  Due for pneumovax. Up to date.  Had JandJ vaccine. Plans on covid booster soon.   Lab Results  Component Value Date   HGBA1C 8.1 (A) 06/09/2020   HGBA1C 9.8 (H) 02/12/2020   Lab Results  Component Value Date   LDLCALC 97 01/28/2020   CREATININE 0.44 06/02/2020   Symptomatic cholelithiasis, GERD, NASH cirrhosis Admitted April 25 with laparoscopic cholecystectomy and intraoperative cholangiogram and liver biopsy.  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  Negative acute hepatitis panel and no alcohol.  Gastroenterology visit May 18 with multiple labs obtained.  Negative hep a antibody and hep B antibody, recommended hep B/a vaccine.  Recommended 6 pound weight loss over the next 12 weeks.  Plan for repeat CT abdomen/pelvis at that visit and possible weight loss clinic eval if unable to lose 6 pounds. She did have an elevated AFP and ammonia level CT liver obtained.  Ordered rifaximin 550 mg twice daily repeat AFP in 3 months. CT abdomen pelvis noted from today.  Morphologic features of liver compatible with cirrhosis, no suspicious liver lesions identified.  Splenomegaly, aortic atherosclerosis. She continues to take Prilosec for GERD.  Needs refill. Working ok. Endoscopy planned in July.   Asthma: appt noted with pulmonary 1/25 - moderate persistent asthma. Continued on Trelegy ellipta and O2 at night. Needs refill albuterol - about 2 times per week. Stable on this dose.   Aortic atherosclerosis,  Hyperlipidemia: Lipitor 10 mg daily, last LDL in January 97. Not fasting today.  Lab Results  Component Value Date   CHOL 158 01/28/2020   HDL 43 01/28/2020   LDLCALC 97 01/28/2020   TRIG 100 01/28/2020   CHOLHDL 3.7 01/28/2020   Lab Results  Component Value Date   ALT 35 06/02/2020   AST 43 (H) 06/02/2020   ALKPHOS 88 06/02/2020   BILITOT 1.0 06/02/2020  History Patient Active Problem List   Diagnosis Date Noted  . Symptomatic cholelithiasis 05/10/2020  . Acquired hypothyroidism 04/05/2020  . Weight gain 04/05/2020  . Type 2 diabetes mellitus with hyperglycemia, without long-term current use of insulin ([place]) 04/05/2020  . History of COVID-19 09/23/2019  . Pneumonia due to COVID-19 virus 09/23/2019  . Cough 09/23/2019  . BMI 40.0-44.9, adult ([place]) 07/22/2019  . Facet arthropathy, lumbar 07/22/2019  . Sacroiliitis ([place]) 07/22/2019  . ANXIETY 09/10/2006  . DEPRESSION 09/10/2006  . ASTHMA  09/10/2006  . HEADACHE 09/10/2006   Past Medical History:  Diagnosis Date  . Allergies   . Anxiety   . Arthritis   . Asthma   . Back pain    Spinal Issue   . Diabetes mellitus without complication ([place])   . DJD (degenerative joint disease), lumbar   . Elevated cholesterol   . Gallstone   . Obesity   . Thyroid disease    Past Surgical History:  Procedure Laterality Date  . ABDOMINAL HYSTERECTOMY  02/21/2003  . CHOLECYSTECTOMY N/A 05/10/2020   Procedure: LAPAROSCOPIC CHOLECYSTECTOMY WITH INTRAOPERATIVE CHOLANGIOGRAM AND LIVER BIOPSY;  Surgeon: Felicie Morn, MD;  Location: [facility];  Service: General;  Laterality: N/A;  . LIVER BIOPSY     Allergies  Allergen Reactions  . Clarithromycin Other (See Comments)    Body cramps   . Codeine Nausea And Vomiting   Prior to Admission medications   Medication Sig Start Date End Date Taking? Authorizing Provider  albuterol (VENTOLIN HFA) 108 (90 Base) MCG/ACT inhaler Inhale 2 puffs into the lungs every 6 (six) hours as needed. For shortness of breath and wheezing 09/01/19   Ok Edwards, PA-C  ARMOUR THYROID 180 MG tablet Take 180 mg by mouth daily. 09/07/19   [provider]  atorvastatin (LIPITOR) 10 MG tablet Take 1 tablet (10 mg total) by mouth daily. 12/17/19   Jettie Booze, MD  Azelastine-Fluticasone [facility]) 137-50 MCG/ACT SUSP Place 1 spray into the nose in the morning and at bedtime. 03/10/20   Just, Laurita Quint, FNP  escitalopram (LEXAPRO) 10 MG tablet Take 1 tablet (10 mg total) by mouth daily. 02/12/20   Wendie Agreste, MD  Fluticasone-Umeclidin-Vilant (TRELEGY ELLIPTA) 100-62.5-25 MCG/INH AEPB Inhale 1 puff into the lungs daily. 10/13/19   Freddi Starr, MD  ibuprofen (ADVIL) 600 MG tablet Take 600 mg by mouth as needed.    [provider]  levocetirizine (XYZAL) 5 MG tablet Take 5 mg by mouth every evening.    [provider]  LORazepam (ATIVAN) 1 MG tablet Take 1 mg by mouth every 8 (eight)  hours as needed for anxiety.    [provider]  metFORMIN (GLUCOPHAGE) 1000 MG tablet Take 1 tablet (1,000 mg total) by mouth 2 (two) times daily with a meal. Patient taking differently: Take 500 mg by mouth 2 (two) times daily with a meal. 03/25/20   Wendie Agreste, MD  omeprazole (PRILOSEC) 40 MG capsule Take 40 mg by mouth daily. 09/25/19   [provider]  ondansetron (ZOFRAN) 4 MG tablet Take 1 tablet (4 mg total) by mouth every 6 (six) hours. 05/10/20   Maczis, Barth Kirks, PA-C  rifaximin (XIFAXAN) 550 MG TABS tablet Take 1 tablet (550 mg total) by mouth 2 (two) times daily. 06/04/20   Jackquline Denmark, MD  traMADol (ULTRAM) 50 MG tablet Take 1 tablet (50 mg total) by mouth every 6 (six) hours as needed. 05/10/20 05/10/21  Maczis, Barth Kirks, PA-C  azelastine (ASTELIN) 0.1 % nasal spray Place 2 sprays into both nostrils 2 (two) times daily. 04/23/19 12/26/19  Ok Edwards, PA-C   Social History   Socioeconomic History  . Marital status: Single    Spouse name: Not on file  . Number of children: Not on file  . Years of education: Not on file  . Highest education level: Not on file  Occupational History  . Not on file  Tobacco Use  . Smoking status: Former Smoker    Packs/day: 0.50    Types: Cigarettes    Start date: 01/16/1985    Quit date: 10/08/2019    Years since quitting: 0.6  . Smokeless tobacco: Never Used  Vaping Use  . Vaping Use: Never used  Substance and Sexual Activity  . Alcohol use: No  . Drug use: No  . Sexual activity: Not on file  Other Topics Concern  . Not on file  Social History Narrative  . Not on file   Social Determinants of Health   Financial Resource Strain: Not on file  Food Insecurity: Not on file  Transportation Needs: Not on file  Physical Activity: Not on file  Stress: Not on file  Social Connections: Not on file  Intimate Partner Violence: Not on file    Review of Systems Per HPI  Objective:   Vitals:   06/09/20 1124  BP:  128/76  Pulse: 95  Resp: 16  Temp: 98.2 F (36.8 C)  TempSrc: Temporal  SpO2: 96%  Weight: 267 lb 3.2 oz (121.2 kg)  Height: 5' 6"  (1.676 m)   Immunization History  Administered Date(s) Administered  . Hep A / Hep B 06/09/2020  . Janssen (J&J) SARS-COV-2 Vaccination 04/26/2019  . Pneumococcal Polysaccharide-23 06/09/2020  . Td 02/12/2020     Physical Exam Vitals reviewed.  Constitutional:      Appearance: She is well-developed.  HENT:     Head: Normocephalic and atraumatic.  Eyes:     Conjunctiva/sclera: Conjunctivae normal.     Pupils: Pupils are equal, round, and reactive to light.  Neck:     Vascular: No carotid bruit.  Cardiovascular:     Rate and Rhythm: Normal rate and regular rhythm.     Heart sounds: Normal heart sounds.  Pulmonary:     Effort: Pulmonary effort is normal.     Breath sounds: Normal breath sounds. No wheezing.  Abdominal:     Palpations: Abdomen is soft. There is no pulsatile mass.  Skin:    General: Skin is warm and dry.  Neurological:     Mental Status: She is alert and oriented to person, place, and time.     Comments: Dysesthesias of plantar feet bilaterally, no focal bony tenderness or dorsal symptoms.  Psychiatric:        Behavior: Behavior normal.     Results for orders placed or performed in visit on 06/09/20  POCT glucose (manual entry)  Result Value Ref Range   POC Glucose 182 (A) 70 - 99 mg/dl  POCT glycosylated hemoglobin (Hb A1C)  Result Value Ref Range   Hemoglobin A1C 8.1 (A) 4.0 - 5.6 %   HbA1c POC (<> result, manual entry)     HbA1c, POC (prediabetic range)     HbA1c, POC (controlled diabetic range)        Assessment & Plan:  LEGNA MAUSOLF is a 50 y.o. female . Type 2 diabetes mellitus with hyperglycemia, without long-term current use of insulin ([place]) - Plan: metFORMIN (GLUCOPHAGE)  850 MG tablet, POCT glucose (manual entry), POCT glycosylated hemoglobin (Hb A1C), CANCELED: Hemoglobin A1c Type 2 diabetes  mellitus with diabetic neuropathy, without long-term current use of insulin ([facility]) - Plan: metFORMIN (GLUCOPHAGE) 850 MG tablet, gabapentin (NEURONTIN) 100 MG capsule, POCT glucose (manual entry), POCT glycosylated hemoglobin (Hb A1C), CANCELED: Hemoglobin A1c   -Improved but still decreased control, change metformin to 850 mg twice daily, but second agent may be needed.  Home monitoring, keep follow-up with endocrinology  -Suspected diabetic neuropathy.  Start gabapentin initially 100 mg daily, increasing up to 3 times daily as needed.  Recheck 1 month.  Potential side effects discussed  Moderate persistent asthma without complication - Plan: albuterol (VENTOLIN HFA) 108 (90 Base) MCG/ACT inhaler, Fluticasone-Umeclidin-Vilant (TRELEGY ELLIPTA) 100-62.5-25 MCG/INH AEPB  -Stable with current regimen, continue Trelegy, albuterol, and follow-up with pulmonary as planned.  Liver cirrhosis secondary to NASH [facility])  -Continue follow-up with gastroenterology.  Gastroesophageal reflux disease, unspecified whether esophagitis present - Plan: omeprazole (PRILOSEC) 40 MG capsule  -Stable with Prilosec, refilled.  Aortic atherosclerosis ([place]) - Plan: atorvastatin (LIPITOR) 10 MG tablet  -Currently on statin, not fasting at this visit.  Fasting labs next visit.  Need for hepatitis vaccination - Plan: Hepatitis A hepatitis B combined vaccine IM  Need for pneumococcal vaccination - Plan: Pneumococcal polysaccharide vaccine 23-valent greater than or equal to 2yo subcutaneous/IM given   Meds ordered this encounter  Medications  . albuterol (VENTOLIN HFA) 108 (90 Base) MCG/ACT inhaler    Sig: Inhale 2 puffs into the lungs every 6 (six) hours as needed. For shortness of breath and wheezing    Dispense:  18 g    Refill:  0  . atorvastatin (LIPITOR) 10 MG tablet    Sig: Take 1 tablet (10 mg total) by mouth daily.    Dispense:  90 tablet    Refill:  3  . Fluticasone-Umeclidin-Vilant (TRELEGY ELLIPTA)  100-62.5-25 MCG/INH AEPB    Sig: Inhale 1 puff into the lungs daily.    Dispense:  1 each    Refill:  6  . omeprazole (PRILOSEC) 40 MG capsule    Sig: Take 1 capsule (40 mg total) by mouth daily.    Dispense:  90 capsule    Refill:  1  . metFORMIN (GLUCOPHAGE) 850 MG tablet    Sig: Take 1 tablet (850 mg total) by mouth 2 (two) times daily with a meal.    Dispense:  60 tablet    Refill:  3  . gabapentin (NEURONTIN) 100 MG capsule    Sig: Take 1 capsule (100 mg total) by mouth 3 (three) times daily. Initially start once per day, increast to BID after 1 week if tolerated then tid if needed.    Dispense:  90 capsule    Refill:  1   Patient Instructions  Keep follow up with specialists including gastroenterology and lung specialist.  Increase metformin to 89m BID for now. I will check A1c.  Try gabapentin once per day initally, increase up to 3 times per day if needed - recheck in 1 month.  We will recheck labs and cholesterol next visit.   Return to the clinic or go to the nearest emergency room if any of your symptoms worsen or new symptoms occur.      Signed, JMerri Ray MD [facility]

## 2020-06-09 NOTE — Patient Instructions (Addendum)
Keep follow up with specialists including gastroenterology and lung specialist.  Increase metformin to 845m BID for now. I will check A1c.  Try gabapentin once per day initally, increase up to 3 times per day if needed - recheck in 1 month.  We will recheck labs and cholesterol next visit.   Return to the clinic or go to the nearest emergency room if any of your symptoms worsen or new symptoms occur.

## 2020-06-10 ENCOUNTER — Encounter: Payer: Self-pay | Admitting: Family Medicine

## 2020-06-10 ENCOUNTER — Ambulatory Visit (INDEPENDENT_AMBULATORY_CARE_PROVIDER_SITE_OTHER): Payer: Commercial Managed Care - PPO | Admitting: Otolaryngology

## 2020-06-10 LAB — SURGICAL PATHOLOGY

## 2020-06-17 ENCOUNTER — Encounter (HOSPITAL_COMMUNITY): Payer: Self-pay

## 2020-06-22 ENCOUNTER — Ambulatory Visit: Payer: Commercial Managed Care - PPO | Admitting: Registered Nurse

## 2020-06-22 ENCOUNTER — Encounter: Payer: Self-pay | Admitting: Registered Nurse

## 2020-06-22 ENCOUNTER — Telehealth: Payer: Self-pay

## 2020-06-22 ENCOUNTER — Other Ambulatory Visit: Payer: Self-pay

## 2020-06-22 VITALS — BP 126/62 | HR 87 | Temp 98.4°F | Resp 18 | Ht 66.0 in | Wt 261.8 lb

## 2020-06-22 DIAGNOSIS — M549 Dorsalgia, unspecified: Secondary | ICD-10-CM | POA: Diagnosis not present

## 2020-06-22 LAB — CBC
HCT: 37.5 % (ref 36.0–46.0)
Hemoglobin: 12.4 g/dL (ref 12.0–15.0)
MCHC: 33.1 g/dL (ref 30.0–36.0)
MCV: 83.5 fl (ref 78.0–100.0)
Platelets: 117 10*3/uL — ABNORMAL LOW (ref 150.0–400.0)
RBC: 4.49 Mil/uL (ref 3.87–5.11)
RDW: 14.7 % (ref 11.5–15.5)
WBC: 6.4 10*3/uL (ref 4.0–10.5)

## 2020-06-22 MED ORDER — PHENAZOPYRIDINE HCL 200 MG PO TABS: 200.0000 mg | ORAL_TABLET | Freq: Three times a day (TID) | ORAL | 0 refills | Status: DC | PRN

## 2020-06-22 MED ORDER — OXYCODONE HCL 5 MG PO CAPS: 5.0000 mg | ORAL_CAPSULE | Freq: Three times a day (TID) | ORAL | 0 refills | Status: DC | PRN

## 2020-06-22 MED ORDER — TAMSULOSIN HCL 0.4 MG PO CAPS: 0.4000 mg | ORAL_CAPSULE | Freq: Every day | ORAL | 3 refills | Status: DC

## 2020-06-22 NOTE — Progress Notes (Signed)
Acute Office Visit  Subjective:    Patient ID: Teresa Castaneda, female    DOB: 11-03-1970, 50 y.o.   MRN: [id number]  Chief Complaint  Patient presents with  . Back Pain    Patient states she has been having some side pain and back pain since Friday but it has now got worse. Patient tried tramadol with no relief. She is unable to void at this time due to already going .    HPI Patient is in today for acute CVA pain  Onset Saturday Started feeling like muscle spasm Located at R CVA Now feels deeper, more intense, stabbing pain Has tried tramadol without relief  Did have CT abd pelvis w contrast on 06/07/20 that did not note nephrolithiasis, no renal abnormalities. Did recently have emergency cholecystectomy, liver biopsy that confirmed cirrhosis without suspicious lesion  No fevers, chills, or fatigue Denies urinary symptoms of note No vaginal symptoms GI: stable given recent cholecystectomy.  No other symptoms.  Past Medical History:  Diagnosis Date  . Allergies   . Anxiety   . Arthritis   . Asthma   . Back pain    Spinal Issue   . Diabetes mellitus without complication ([place])   . DJD (degenerative joint disease), lumbar   . Elevated cholesterol   . Gallstone   . Obesity   . Thyroid disease     Past Surgical History:  Procedure Laterality Date  . ABDOMINAL HYSTERECTOMY  02/21/2003  . CHOLECYSTECTOMY N/A 05/10/2020   Procedure: LAPAROSCOPIC CHOLECYSTECTOMY WITH INTRAOPERATIVE CHOLANGIOGRAM AND LIVER BIOPSY;  Surgeon: Felicie Morn, MD;  Location: [facility];  Service: General;  Laterality: N/A;  . LIVER BIOPSY      Family History  Problem Relation Age of Onset  . Coronary artery disease Mother   . Diabetes Mother   . COPD Mother   . Coronary artery disease Father   . Hypertension Father   . Colon cancer Neg Hx   . Esophageal cancer Neg Hx     Social History   Socioeconomic History  . Marital status: Single    Spouse name: Not on file  . Number  of children: Not on file  . Years of education: Not on file  . Highest education level: Not on file  Occupational History  . Not on file  Tobacco Use  . Smoking status: Former Smoker    Packs/day: 0.50    Types: Cigarettes    Start date: 01/16/1985    Quit date: 10/08/2019    Years since quitting: 0.7  . Smokeless tobacco: Never Used  Vaping Use  . Vaping Use: Never used  Substance and Sexual Activity  . Alcohol use: No  . Drug use: No  . Sexual activity: Not on file  Other Topics Concern  . Not on file  Social History Narrative  . Not on file   Social Determinants of Health   Financial Resource Strain: Not on file  Food Insecurity: Not on file  Transportation Needs: Not on file  Physical Activity: Not on file  Stress: Not on file  Social Connections: Not on file  Intimate Partner Violence: Not on file    Outpatient Medications Prior to Visit  Medication Sig Dispense Refill  . albuterol (VENTOLIN HFA) 108 (90 Base) MCG/ACT inhaler Inhale 2 puffs into the lungs every 6 (six) hours as needed. For shortness of breath and wheezing 18 g 0  . ARMOUR THYROID 180 MG tablet Take 180 mg by mouth daily.    Marland Kitchen  atorvastatin (LIPITOR) 10 MG tablet Take 1 tablet (10 mg total) by mouth daily. 90 tablet 3  . Azelastine-Fluticasone (DYMISTA) 137-50 MCG/ACT SUSP Place 1 spray into the nose in the morning and at bedtime. 23 g 6  . escitalopram (LEXAPRO) 10 MG tablet Take 1 tablet (10 mg total) by mouth daily. 30 tablet 2  . Fluticasone-Umeclidin-Vilant (TRELEGY ELLIPTA) 100-62.5-25 MCG/INH AEPB Inhale 1 puff into the lungs daily. 1 each 6  . gabapentin (NEURONTIN) 100 MG capsule Take 1 capsule (100 mg total) by mouth 3 (three) times daily. Initially start once per day, increast to BID after 1 week if tolerated then tid if needed. 90 capsule 1  . ibuprofen (ADVIL) 600 MG tablet Take 600 mg by mouth as needed.    Marland Kitchen levocetirizine (XYZAL) 5 MG tablet Take 5 mg by mouth every evening.    Marland Kitchen  LORazepam (ATIVAN) 1 MG tablet Take 1 mg by mouth every 8 (eight) hours as needed for anxiety.    . metFORMIN (GLUCOPHAGE) 850 MG tablet Take 1 tablet (850 mg total) by mouth 2 (two) times daily with a meal. 60 tablet 3  . omeprazole (PRILOSEC) 40 MG capsule Take 1 capsule (40 mg total) by mouth daily. 90 capsule 1  . ondansetron (ZOFRAN) 4 MG tablet Take 1 tablet (4 mg total) by mouth every 6 (six) hours. 12 tablet 0  . rifaximin (XIFAXAN) 550 MG TABS tablet Take 1 tablet (550 mg total) by mouth 2 (two) times daily. 60 tablet 3  . traMADol (ULTRAM) 50 MG tablet Take 1 tablet (50 mg total) by mouth every 6 (six) hours as needed. 20 tablet 0   No facility-administered medications prior to visit.    Allergies  Allergen Reactions  . Clarithromycin Other (See Comments)    Body cramps   . Codeine Nausea And Vomiting    Review of Systems  Constitutional: Negative.   HENT: Negative.   Eyes: Negative.   Respiratory: Negative.   Cardiovascular: Negative.   Gastrointestinal: Negative.   Endocrine: Negative.   Genitourinary: Negative.   Musculoskeletal: Positive for back pain.  Skin: Negative.   Allergic/Immunologic: Negative.   Neurological: Negative.   Hematological: Negative.   Psychiatric/Behavioral: Negative.   All other systems reviewed and are negative.      Objective:    Physical Exam Vitals and nursing note reviewed.  Constitutional:      General: She is not in acute distress.    Appearance: Normal appearance. She is not ill-appearing, toxic-appearing or diaphoretic.  Cardiovascular:     Rate and Rhythm: Normal rate and regular rhythm.     Pulses: Normal pulses.     Heart sounds: Normal heart sounds. No murmur heard. No friction rub. No gallop.   Pulmonary:     Effort: Pulmonary effort is normal. No respiratory distress.     Breath sounds: Normal breath sounds. No stridor. No wheezing, rhonchi or rales.  Chest:     Chest wall: No tenderness.  Abdominal:      General: Abdomen is flat.     Palpations: Abdomen is soft.     Tenderness: There is right CVA tenderness. There is no left CVA tenderness.  Skin:    General: Skin is warm and dry.     Capillary Refill: Capillary refill takes less than 2 seconds.  Neurological:     General: No focal deficit present.     Mental Status: She is alert and oriented to person, place, and time. Mental status is at  baseline.  Psychiatric:        Mood and Affect: Mood normal.        Behavior: Behavior normal.        Thought Content: Thought content normal.        Judgment: Judgment normal.     BP 126/62   Pulse 87   Temp 98.4 F (36.9 C) (Temporal)   Resp 18   Ht 5' 6"  (1.676 m)   Wt 261 lb 12.8 oz (118.8 kg)   SpO2 99%   BMI 42.26 kg/m  Wt Readings from Last 3 Encounters:  06/22/20 261 lb 12.8 oz (118.8 kg)  06/09/20 267 lb 3.2 oz (121.2 kg)  06/02/20 265 lb (120.2 kg)    There are no preventive care reminders to display for this patient.  There are no preventive care reminders to display for this patient.   Lab Results  Component Value Date   TSH <0.01 Repeated and verified X2. (L) 05/04/2020   Lab Results  Component Value Date   WBC 7.1 06/02/2020   HGB 13.1 06/02/2020   HCT 39.0 06/02/2020   MCV 83.4 06/02/2020   PLT 121.0 (L) 06/02/2020   Lab Results  Component Value Date   NA 136 06/02/2020   K 4.1 06/02/2020   CO2 24 06/02/2020   GLUCOSE 170 (H) 06/02/2020   BUN 6 06/02/2020   CREATININE 0.44 06/02/2020   BILITOT 1.0 06/02/2020   ALKPHOS 88 06/02/2020   AST 43 (H) 06/02/2020   ALT 35 06/02/2020   PROT 6.9 06/02/2020   ALBUMIN 3.5 06/02/2020   CALCIUM 8.7 06/02/2020   ANIONGAP 10 05/10/2020   GFR 114.31 06/02/2020   Lab Results  Component Value Date   CHOL 158 01/28/2020   Lab Results  Component Value Date   HDL 43 01/28/2020   Lab Results  Component Value Date   LDLCALC 97 01/28/2020   Lab Results  Component Value Date   TRIG 100 01/28/2020   Lab  Results  Component Value Date   CHOLHDL 3.7 01/28/2020   Lab Results  Component Value Date   HGBA1C 8.1 (A) 06/09/2020       Assessment & Plan:   Problem List Items Addressed This Visit   None   Visit Diagnoses    Back pain, unspecified back location, unspecified back pain laterality, unspecified chronicity    -  Primary   Relevant Medications   oxycodone (OXY-IR) 5 MG capsule   Other Relevant Orders   POCT URINALYSIS DIP (CLINITEK)   Urine Culture   CBC   Comprehensive metabolic panel   Costovertebral angle pain       Relevant Medications   oxycodone (OXY-IR) 5 MG capsule   phenazopyridine (PYRIDIUM) 200 MG tablet   tamsulosin (FLOMAX) 0.4 MG CAPS capsule   Other Relevant Orders   CBC   Comprehensive metabolic panel       Meds ordered this encounter  Medications  . oxycodone (OXY-IR) 5 MG capsule    Sig: Take 1 capsule (5 mg total) by mouth every 8 (eight) hours as needed.    Dispense:  30 capsule    Refill:  0    Order Specific Question:   Supervising Provider    Answer:   Carlota Raspberry, JEFFREY R [[id number]]  . phenazopyridine (PYRIDIUM) 200 MG tablet    Sig: Take 1 tablet (200 mg total) by mouth 3 (three) times daily as needed for pain.    Dispense:  10 tablet    Refill:  0    Order Specific Question:   Supervising Provider    Answer:   Carlota Raspberry, JEFFREY R [[id number]]  . tamsulosin (FLOMAX) 0.4 MG CAPS capsule    Sig: Take 1 capsule (0.4 mg total) by mouth daily.    Dispense:  30 capsule    Refill:  3    Order Specific Question:   Supervising Provider    Answer:   Carlota Raspberry, JEFFREY R [[id number]]   PLAN  Giving many symptoms typical of nephrolithiasis. Will treat as such briefly with close consideration for muscle spasm.  Work note for the rest of the week  Low threshold for ER follow up given recent surgery. No evidence of peritonitis today.   CBC and CMET to monitor liver and renal function, look at WBC for signs of infection  Oxycodone, azo, and flomax at hepatic dose  adjustment  POCT UA and Urine culture collected  Patient encouraged to call clinic with any questions, comments, or concerns.  Maximiano Coss, NP    Addendum: CMA misplaced urine poct UA results. Unable to document. Will rely on culture and add on urinalysis.  Kathrin Ruddy, NP

## 2020-06-22 NOTE — Patient Instructions (Signed)
Ms. Moralez -   Good to see you, sorry you're not feeling well.  Urinalysis will be done on site. Urine culture will be sent to lab. This may take a few days.  CBC and CMET will monitor for renal changes or signs of infection. These should be back by the end of the day today.  Oxycodone, flomax, and azo for pain. These are all as needed. If they're not helping, feel free to stop  Consideration for muscle spasm still very much there - I'll keep you in the loop on the results, you keep me in the loop on your symptoms.  Hope you feel better soon,  Denice Paradise

## 2020-06-22 NOTE — Telephone Encounter (Signed)
Please schedule pt for office visit due to new symptoms.   Pt called in complaining of lower abdominal and back pain, pain with wiping after urination, symptoms starting Friday. Pain 6/10 Drinking fluids and urinating normally denies burning with urination, chest pain, fever, SOB or blood in urine, no other symptoms at this time.

## 2020-06-23 ENCOUNTER — Encounter: Payer: Self-pay | Admitting: Registered Nurse

## 2020-06-23 DIAGNOSIS — K7460 Unspecified cirrhosis of liver: Secondary | ICD-10-CM | POA: Insufficient documentation

## 2020-06-23 LAB — COMPREHENSIVE METABOLIC PANEL
ALT: 57 U/L — ABNORMAL HIGH (ref 0–35)
AST: 73 U/L — ABNORMAL HIGH (ref 0–37)
Albumin: 3.4 g/dL — ABNORMAL LOW (ref 3.5–5.2)
Alkaline Phosphatase: 86 U/L (ref 39–117)
BUN: 7 mg/dL (ref 6–23)
CO2: 24 mEq/L (ref 19–32)
Calcium: 9 mg/dL (ref 8.4–10.5)
Chloride: 101 mEq/L (ref 96–112)
Creatinine, Ser: 0.47 mg/dL (ref 0.40–1.20)
GFR: 112.46 mL/min (ref >60.00)
Glucose, Bld: 165 mg/dL — ABNORMAL HIGH (ref 70–99)
Potassium: 3.9 mEq/L (ref 3.5–5.1)
Sodium: 134 mEq/L — ABNORMAL LOW (ref 135–145)
Total Bilirubin: 0.5 mg/dL (ref 0.2–1.2)
Total Protein: 6.8 g/dL (ref 6.0–8.3)

## 2020-06-24 LAB — URINE CULTURE
MICRO NUMBER:: 11980442
SPECIMEN QUALITY:: ADEQUATE

## 2020-06-28 ENCOUNTER — Telehealth: Payer: Self-pay

## 2020-06-28 MED ORDER — LACTULOSE 10 GM/15ML PO SOLN: 20.0000 g | Freq: Every day | ORAL | 2 refills | Status: DC

## 2020-06-28 NOTE — Telephone Encounter (Signed)
LVM  Labs for 6-7 was reviewed and they are normal due the fact of her liver cirrhosis. She can call back with any questions and she needs to keep her procedure date.

## 2020-06-28 NOTE — Telephone Encounter (Signed)
LVM for patient to call back regarding medication. PA was denied for xifaxian and patient will start lactulose for now. Patient assistance was mailed about 2 weeks ago and patient was made aware of that. Medication sent to the pharmacy.Verbal order of lactulose 30cc (ml) take daily to titrate to 1-2 softer bm daily

## 2020-06-28 NOTE — Telephone Encounter (Addendum)
Fyi, Pt returned call and was given your message, She did not have further questions.

## 2020-06-28 NOTE — Telephone Encounter (Signed)
Noted  

## 2020-06-29 ENCOUNTER — Other Ambulatory Visit: Payer: Self-pay

## 2020-06-29 ENCOUNTER — Ambulatory Visit (INDEPENDENT_AMBULATORY_CARE_PROVIDER_SITE_OTHER): Payer: Commercial Managed Care - PPO | Admitting: Sports Medicine

## 2020-06-29 VITALS — BP 115/57 | Ht 66.0 in | Wt 256.0 lb

## 2020-06-29 DIAGNOSIS — G8929 Other chronic pain: Secondary | ICD-10-CM | POA: Diagnosis not present

## 2020-06-29 DIAGNOSIS — M545 Low back pain, unspecified: Secondary | ICD-10-CM

## 2020-06-29 DIAGNOSIS — M4807 Spinal stenosis, lumbosacral region: Secondary | ICD-10-CM | POA: Diagnosis not present

## 2020-06-30 ENCOUNTER — Encounter: Payer: Self-pay | Admitting: Sports Medicine

## 2020-06-30 NOTE — Progress Notes (Signed)
Patient ID: Teresa Castaneda, female   DOB: 06-17-1970, 50 y.o.   MRN: [id number]  [name] presents today requesting a lumbar spine MRI.  An x-ray of her lumbar spine done a year ago showed some mild disc space loss from L3-S1, most pronounced at L5-S1.  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  She has undergone 2 SI joint injections at [facility] imaging.  The last injection was performed in February and provided her with a few weeks of relief.  Her pain currently is diffuse in her low back.  Worse with activity but also present at rest.  She does occasionally endorse numbness, tingling, and weakness in both legs.  In addition to the injections, she is also been treated with some home exercises.  Given her persistent symptoms I do think it reasonable to proceed with an MRI specifically to rule out spinal stenosis.  We know that part of her symptoms are originating from her SI joint but the numbness, tingling, and weakness in her legs could be the result of stenosis.  Phone follow-up with MRI results when available.  We will delineate further treatment based on those findings.

## 2020-07-01 ENCOUNTER — Telehealth: Payer: Self-pay | Admitting: Gastroenterology

## 2020-07-01 NOTE — Telephone Encounter (Signed)
Patient called states she was given Lactulose 10gm\15 medication and it is is giving her diarrhea and she is requesting to speak with someone.

## 2020-07-02 NOTE — Telephone Encounter (Signed)
Teresa Castaneda pt with Cirrhosis calling and states she has been put on Lactulose 30cc at night. She states when she first takes it she has about 10 diarrhea stools and has had some incontinence and her bottom is raw. Pt wants to know if perhaps she can take a smaller amount of the lactulose. Dr. Carlean Purl as DOD please advise.

## 2020-07-02 NOTE — Telephone Encounter (Signed)
Med list says she is taking it twice a day.  If she is I would reduce it to 1 tablespoon daily.  If she is taking only 1 tablespoon a day and having 10 stools I would stop it altogether and regroup with Dr. Lyndel Safe when he is back.

## 2020-07-02 NOTE — Telephone Encounter (Signed)
Spoke with pt and she states she was only taking it once a day. She has already stopped taking it because she could not sleep or work with the diarrhea. Dr. Lyndel Safe see note below and advise regarding lactulose.

## 2020-07-04 ENCOUNTER — Ambulatory Visit
Admission: RE | Admit: 2020-07-04 | Discharge: 2020-07-04 | Disposition: A | Discharge status: Home / Self Care | Payer: Commercial Managed Care - PPO | Source: Ambulatory Visit | Attending: Sports Medicine | Admitting: Sports Medicine

## 2020-07-04 DIAGNOSIS — G8929 Other chronic pain: Secondary | ICD-10-CM

## 2020-07-04 DIAGNOSIS — M4807 Spinal stenosis, lumbosacral region: Secondary | ICD-10-CM

## 2020-07-06 NOTE — Telephone Encounter (Signed)
Told patient that I would resubmit her PA for xifaxan and she has mailed in her patient's assistant program forms and as of now it is not yet received per the company.

## 2020-07-06 NOTE — Telephone Encounter (Signed)
Lets hold off on lactulose since she is having significant diarrhea RG

## 2020-07-06 NOTE — Telephone Encounter (Signed)
Pt notified via mychart

## 2020-07-06 NOTE — Telephone Encounter (Signed)
Patient said she stopped taking the medication on Friday 07-02-2020 due to the  complications that the medication caused her.

## 2020-07-07 ENCOUNTER — Telehealth: Payer: Self-pay | Admitting: Sports Medicine

## 2020-07-07 NOTE — Telephone Encounter (Signed)
I spoke with Teresa Castaneda on the phone today after reviewing the MRI scan of her lumbar spine.  It is an unremarkable study.  Therefore, I believe her pain is all originating from her SI joint.  She has had 2 previous injections the last one being in February of this year.  Although I did discuss referral to Dr. Lynann Bologna to discuss possible surgical options, I doubt she is a surgical candidate at this point in time due to her ongoing health issues.  Therefore, we will schedule a repeat right SI joint injection at [facility] imaging in hopes that it will provide her with a few more months of relief while she gets healthier.  She is in agreement with that plan.

## 2020-07-08 ENCOUNTER — Other Ambulatory Visit: Payer: Self-pay | Admitting: Sports Medicine

## 2020-07-08 ENCOUNTER — Other Ambulatory Visit: Payer: Self-pay

## 2020-07-08 DIAGNOSIS — M461 Sacroiliitis, not elsewhere classified: Secondary | ICD-10-CM

## 2020-07-08 NOTE — Telephone Encounter (Signed)
Medication xifaxan has been approved through insurance. Patient will pick up and start taking

## 2020-07-09 ENCOUNTER — Encounter: Payer: Self-pay | Admitting: Family Medicine

## 2020-07-09 ENCOUNTER — Ambulatory Visit: Payer: Commercial Managed Care - PPO | Admitting: Family Medicine

## 2020-07-09 ENCOUNTER — Ambulatory Visit: Payer: Commercial Managed Care - PPO | Admitting: Internal Medicine

## 2020-07-09 ENCOUNTER — Other Ambulatory Visit: Payer: Self-pay

## 2020-07-09 VITALS — BP 126/82 | HR 83 | Ht 66.0 in | Wt 261.8 lb

## 2020-07-09 VITALS — BP 130/80 | HR 76 | Temp 98.2°F | Resp 16 | Ht 66.0 in | Wt 261.2 lb

## 2020-07-09 DIAGNOSIS — E1140 Type 2 diabetes mellitus with diabetic neuropathy, unspecified: Secondary | ICD-10-CM

## 2020-07-09 DIAGNOSIS — E039 Hypothyroidism, unspecified: Secondary | ICD-10-CM

## 2020-07-09 DIAGNOSIS — Z23 Encounter for immunization: Secondary | ICD-10-CM

## 2020-07-09 DIAGNOSIS — F411 Generalized anxiety disorder: Secondary | ICD-10-CM

## 2020-07-09 DIAGNOSIS — E1165 Type 2 diabetes mellitus with hyperglycemia: Secondary | ICD-10-CM

## 2020-07-09 DIAGNOSIS — F5104 Psychophysiologic insomnia: Secondary | ICD-10-CM

## 2020-07-09 LAB — TSH: TSH: 0.55 u[IU]/mL (ref 0.35–4.50)

## 2020-07-09 MED ORDER — DAPAGLIFLOZIN PROPANEDIOL 5 MG PO TABS: 5.0000 mg | ORAL_TABLET | Freq: Every day | ORAL | 6 refills | Status: DC

## 2020-07-09 MED ORDER — ESCITALOPRAM OXALATE 20 MG PO TABS
20.0000 mg | ORAL_TABLET | Freq: Every day | ORAL | 1 refills | Status: DC
Start: 2020-07-09 — End: 2020-10-18

## 2020-07-09 NOTE — Patient Instructions (Addendum)
Can try 2 gabapentin in the evening, 1 in the morning and let me know how that is doing for nerve symptoms in next few weeks.   See info on sleep below. Ok to try low dose melatonin (223mg up to 1 mg initially) over the counter. We can try higher dose Lexapro for now as well. Virtual visit in 1 month to follow up, but let me know if there are questions sooner.    Third hepatitis B vaccine and second hepatitis A vaccine in 5 months.  Insomnia Insomnia is a sleep disorder that makes it difficult to fall asleep or stay asleep. Insomnia can cause fatigue, low energy, difficulty concentrating, moodswings, and poor performance at work or school. There are three different ways to classify insomnia: Difficulty falling asleep. Difficulty staying asleep. Waking up too early in the morning. Any type of insomnia can be long-term (chronic) or short-term (acute). Both are common. Short-term insomnia usually lasts for three months or less. Chronic insomnia occurs at least three times a week for longer than threemonths. What are the causes? Insomnia may be caused by another condition, situation, or substance, such as: Anxiety. Certain medicines. Gastroesophageal reflux disease (GERD) or other gastrointestinal conditions. Asthma or other breathing conditions. Restless legs syndrome, sleep apnea, or other sleep disorders. Chronic pain. Menopause. Stroke. Abuse of alcohol, tobacco, or illegal drugs. Mental health conditions, such as depression. Caffeine. Neurological disorders, such as Alzheimer's disease. An overactive thyroid (hyperthyroidism). Sometimes, the cause of insomnia may not be known. What increases the risk? Risk factors for insomnia include: Gender. Women are affected more often than men. Age. Insomnia is more common as you get older. Stress. Lack of exercise. Irregular work schedule or working night shifts. Traveling between different time zones. Certain medical and mental health  conditions. What are the signs or symptoms? If you have insomnia, the main symptom is having trouble falling asleep or having trouble staying asleep. This may lead to other symptoms, such as: Feeling fatigued or having low energy. Feeling nervous about going to sleep. Not feeling rested in the morning. Having trouble concentrating. Feeling irritable, anxious, or depressed. How is this diagnosed? This condition may be diagnosed based on: Your symptoms and medical history. Your health care provider may ask about: Your sleep habits. Any medical conditions you have. Your mental health. A physical exam. How is this treated? Treatment for insomnia depends on the cause. Treatment may focus on treating an underlying condition that is causing insomnia. Treatment may also include: Medicines to help you sleep. Counseling or therapy. Lifestyle adjustments to help you sleep better. Follow these instructions at home: Eating and drinking  Limit or avoid alcohol, caffeinated beverages, and cigarettes, especially close to bedtime. These can disrupt your sleep. Do not eat a large meal or eat spicy foods right before bedtime. This can lead to digestive discomfort that can make it hard for you to sleep.  Sleep habits  Keep a sleep diary to help you and your health care provider figure out what could be causing your insomnia. Write down: When you sleep. When you wake up during the night. How well you sleep. How rested you feel the next day. Any side effects of medicines you are taking. What you eat and drink. Make your bedroom a dark, comfortable place where it is easy to fall asleep. Put up shades or blackout curtains to block light from outside. Use a white noise machine to block noise. Keep the temperature cool. Limit screen use before bedtime. This  includes: Watching TV. Using your smartphone, tablet, or computer. Stick to a routine that includes going to bed and waking up at the same times  every day and night. This can help you fall asleep faster. Consider making a quiet activity, such as reading, part of your nighttime routine. Try to avoid taking naps during the day so that you sleep better at night. Get out of bed if you are still awake after 15 minutes of trying to sleep. Keep the lights down, but try reading or doing a quiet activity. When you feel sleepy, go back to bed.  General instructions Take over-the-counter and prescription medicines only as told by your health care provider. Exercise regularly, as told by your health care provider. Avoid exercise starting several hours before bedtime. Use relaxation techniques to manage stress. Ask your health care provider to suggest some techniques that may work well for you. These may include: Breathing exercises. Routines to release muscle tension. Visualizing peaceful scenes. Make sure that you drive carefully. Avoid driving if you feel very sleepy. Keep all follow-up visits as told by your health care provider. This is important. Contact a health care provider if: You are tired throughout the day. You have trouble in your daily routine due to sleepiness. You continue to have sleep problems, or your sleep problems get worse. Get help right away if: You have serious thoughts about hurting yourself or someone else. If you ever feel like you may hurt yourself or others, or have thoughts about taking your own life, get help right away. You can go to your nearest emergency department or call: Your local emergency services (911 in the [place] A suicide crisis helpline, such as the [place] at (725)283-8251. This is open 24 hours a day. Summary Insomnia is a sleep disorder that makes it difficult to fall asleep or stay asleep. Insomnia can be long-term (chronic) or short-term (acute). Treatment for insomnia depends on the cause. Treatment may focus on treating an underlying condition that is causing  insomnia. Keep a sleep diary to help you and your health care provider figure out what could be causing your insomnia. This information is not intended to replace advice given to you by your health care provider. Make sure you discuss any questions you have with your healthcare provider. Document Revised: 11/13/2019 Document Reviewed: 11/13/2019 Elsevier Patient Education  2022 [employer].

## 2020-07-09 NOTE — Patient Instructions (Addendum)
-   Continue Metformin 850 mg, 1 tablet with Breakfast and Supper  - Start Farxiga 5 mg daily  - Continue Armour thyroid 180 mg daily

## 2020-07-09 NOTE — Progress Notes (Signed)
Subjective:  Patient ID: Teresa Castaneda, female    DOB: 26-Apr-1970  Age: 50 y.o. MRN: [id number]  CC:  Chief Complaint  Patient presents with   Diabetes    Pt was started on Farxiga 5 mg by endocrinology, pt was started on Gabapentin last visit for neuropathy reports about same this visit, endo will begin taking over care.    Hyperthyroidism    Pt endo will being care for hyperthyroidism     HPI Teresa Castaneda presents for   Diabetes: Complicated by hyperglycemia and suspected diabetic neuropathy.  Previously treated with metformin 500 mg, increased to 850 mg twice daily.  Added gabapentin at her May 25 visit.  Has endocrinologist, follow-up of diabetes as well as hypothyroidism with endocrinology.  Wilder Glade was added at endocrinology today. Tolerating 836m dose.  Still some dysesthesias, was notified about neuropathy today. Taking gabapentin 1041mBID. Most symptoms in evening. 2nd dose after supper. Trouble sleeping - waking up with cramps in feet. No new sedation with gabapentin.  Sleep test in October 2021 - AHI 2/9. No CPAP. Oxygen at night.   Lab Results  Component Value Date   HGBA1C 8.1 (A) 06/09/2020   HGBA1C 9.8 (H) 02/12/2020   Lab Results  Component Value Date   LDLCALC 97 01/28/2020   CREATININE 0.47 06/22/2020   Hypothyroidism Discussed today with endocrinology.  Note reviewed. Preference for levothyroxine instead of Armour Thyroid due to stability of T4 content and levothyroxine as well as physiologic levels of T4-T3.  Patient decided to remain on Armour Thyroid, continued on 180 mg daily.   Immunization History  Administered Date(s) Administered   Hep A / Hep B 06/09/2020   Hepb-cpg 07/09/2020   Janssen (J&J) SARS-COV-2 Vaccination 04/26/2019   Pneumococcal Polysaccharide-23 06/09/2020   Td 02/12/2020   Due for 2nd hep B vaccine today. 3rd in 5 months with Hep A.   Depression/Anxiety.  More anxiety than depression. Frustrated by health issues.  She  checked into counseling - costprohibitive.  On lexapro 103mInitially more helpful. More anxious since more liver issues. More irritable.  No SI/HI.    [facility] 07/09/2020 06/09/2020 03/10/2020 02/12/2020  Decreased Interest 0 1 0 0  Down, Depressed, Hopeless 1 1 0 1  PHQ - 2 Score 1 2 0 1  Altered sleeping 2 3 - -  Tired, decreased energy 2 3 - -  Change in appetite 1 1 - -  Feeling bad or failure about yourself  0 0 - -  Trouble concentrating 1 1 - -  Moving slowly or fidgety/restless 0 1 - -  Suicidal thoughts 0 0 - -  PHQ-9 Score 7 11 - -      History Patient Active Problem List   Diagnosis Date Noted   Cirrhosis of liver ([facility]/08/2020   Symptomatic cholelithiasis 05/10/2020   Acquired hypothyroidism 04/05/2020   Weight gain 04/05/2020   Type 2 diabetes mellitus with hyperglycemia, without long-term current use of insulin ([place]3/21/2022   History of COVID-19 09/23/2019   Pneumonia due to COVID-19 virus 09/23/2019   Cough 09/23/2019   BMI 40.0-44.9, adult ([facility] [facility]7/06/2019   Facet arthropathy, lumbar 07/22/2019   Sacroiliitis ([facility]/06/2019   ANXIETY 09/10/2006   DEPRESSION 09/10/2006   ASTHMA 09/10/2006   HEADACHE 09/10/2006   Past Medical History:  Diagnosis Date   Allergies    Anxiety    Arthritis    Asthma    Back pain    Spinal  Issue    Diabetes mellitus without complication (HCC)    DJD (degenerative joint disease), lumbar    Elevated cholesterol    Gallstone    Obesity    Thyroid disease    Past Surgical History:  Procedure Laterality Date   ABDOMINAL HYSTERECTOMY  02/21/2003   CHOLECYSTECTOMY N/A 05/10/2020   Procedure: LAPAROSCOPIC CHOLECYSTECTOMY WITH INTRAOPERATIVE CHOLANGIOGRAM AND LIVER BIOPSY;  Surgeon: Felicie Morn, MD;  Location: [facility];  Service: General;  Laterality: N/A;   LIVER BIOPSY     Allergies  Allergen Reactions   Clarithromycin Other (See Comments)    Body cramps    Codeine Nausea And Vomiting    Prior to Admission medications   Medication Sig Start Date End Date Taking? Authorizing Provider  albuterol (VENTOLIN HFA) 108 (90 Base) MCG/ACT inhaler Inhale 2 puffs into the lungs every 6 (six) hours as needed. For shortness of breath and wheezing 06/09/20   Wendie Agreste, MD  ARMOUR THYROID 180 MG tablet Take 180 mg by mouth daily. 09/07/19   [provider]  atorvastatin (LIPITOR) 10 MG tablet Take 1 tablet (10 mg total) by mouth daily. 06/09/20   Wendie Agreste, MD  Azelastine-Fluticasone [facility]) 137-50 MCG/ACT SUSP Place 1 spray into the nose in the morning and at bedtime. 03/10/20   Just, Laurita Quint, FNP  dapagliflozin propanediol (FARXIGA) 5 MG TABS tablet Take 1 tablet (5 mg total) by mouth daily before breakfast. 07/09/20   Shamleffer, Melanie Crazier, MD  escitalopram (LEXAPRO) 10 MG tablet Take 1 tablet (10 mg total) by mouth daily. 02/12/20   Wendie Agreste, MD  Fluticasone-Umeclidin-Vilant (TRELEGY ELLIPTA) 100-62.5-25 MCG/INH AEPB Inhale 1 puff into the lungs daily. 06/09/20   Wendie Agreste, MD  gabapentin (NEURONTIN) 100 MG capsule Take 1 capsule (100 mg total) by mouth 3 (three) times daily. Initially start once per day, increast to BID after 1 week if tolerated then tid if needed. 06/09/20   Wendie Agreste, MD  ibuprofen (ADVIL) 600 MG tablet Take 600 mg by mouth as needed.    [provider]  lactulose (CHRONULAC) 10 GM/15ML solution Take 30 mLs (20 g total) by mouth daily. Please titrate to 1-2 softer bowel movements a day 06/28/20   Jackquline Denmark, MD  levocetirizine (XYZAL) 5 MG tablet Take 5 mg by mouth every evening.    [provider]  LORazepam (ATIVAN) 1 MG tablet Take 1 mg by mouth every 8 (eight) hours as needed for anxiety.    [provider]  metFORMIN (GLUCOPHAGE) 850 MG tablet Take 1 tablet (850 mg total) by mouth 2 (two) times daily with a meal. 06/09/20   Wendie Agreste, MD  omeprazole (PRILOSEC) 40 MG capsule Take 1  capsule (40 mg total) by mouth daily. 06/09/20   Wendie Agreste, MD  ondansetron (ZOFRAN) 4 MG tablet Take 1 tablet (4 mg total) by mouth every 6 (six) hours. 05/10/20   Maczis, Barth Kirks, PA-C  oxycodone (OXY-IR) 5 MG capsule Take 1 capsule (5 mg total) by mouth every 8 (eight) hours as needed. 06/22/20   Maximiano Coss, NP  phenazopyridine (PYRIDIUM) 200 MG tablet Take 1 tablet (200 mg total) by mouth 3 (three) times daily as needed for pain. 06/22/20   Maximiano Coss, NP  rifaximin (XIFAXAN) 550 MG TABS tablet Take 1 tablet (550 mg total) by mouth 2 (two) times daily. 06/04/20   Jackquline Denmark, MD  tamsulosin (FLOMAX) 0.4 MG CAPS capsule Take 1 capsule (0.4  mg total) by mouth daily. 06/22/20   Maximiano Coss, NP  traMADol (ULTRAM) 50 MG tablet Take 1 tablet (50 mg total) by mouth every 6 (six) hours as needed. 05/10/20 05/10/21  Maczis, Barth Kirks, PA-C  azelastine (ASTELIN) 0.1 % nasal spray Place 2 sprays into both nostrils 2 (two) times daily. 04/23/19 12/26/19  Ok Edwards, PA-C   Social History   Socioeconomic History   Marital status: Single    Spouse name: Not on file   Number of children: Not on file   Years of education: Not on file   Highest education level: Not on file  Occupational History   Not on file  Tobacco Use   Smoking status: Former    Packs/day: 0.50    Pack years: 0.00    Types: Cigarettes    Start date: 01/16/1985    Quit date: 10/08/2019    Years since quitting: 0.7   Smokeless tobacco: Never  Vaping Use   Vaping Use: Never used  Substance and Sexual Activity   Alcohol use: No   Drug use: No   Sexual activity: Not on file  Other Topics Concern   Not on file  Social History Narrative   Not on file   Social Determinants of Health   Financial Resource Strain: Not on file  Food Insecurity: Not on file  Transportation Needs: Not on file  Physical Activity: Not on file  Stress: Not on file  Social Connections: Not on file  Intimate Partner Violence: Not on file     Review of Systems Per HPI.   Objective:   Vitals:   07/09/20 1059  BP: 130/80  Pulse: 76  Resp: 16  Temp: 98.2 F (36.8 C)  TempSrc: Temporal  SpO2: 97%  Weight: 261 lb 3.2 oz (118.5 kg)  Height: 5' 6"  (1.676 m)     Physical Exam Vitals reviewed.  Constitutional:      Appearance: Normal appearance. She is well-developed. She is obese.  HENT:     Head: Normocephalic and atraumatic.  Eyes:     Conjunctiva/sclera: Conjunctivae normal.     Pupils: Pupils are equal, round, and reactive to light.  Neck:     Vascular: No carotid bruit.  Cardiovascular:     Rate and Rhythm: Normal rate and regular rhythm.     Heart sounds: Normal heart sounds.  Pulmonary:     Effort: Pulmonary effort is normal.     Breath sounds: Normal breath sounds.  Abdominal:     Palpations: Abdomen is soft. There is no pulsatile mass.     Tenderness: There is no abdominal tenderness.  Musculoskeletal:     Right lower leg: No edema.     Left lower leg: No edema.  Skin:    General: Skin is warm and dry.  Neurological:     Mental Status: She is alert and oriented to person, place, and time.  Psychiatric:        Mood and Affect: Mood normal.        Behavior: Behavior normal.        Thought Content: Thought content normal.     33 minutes spent during visit, including chart review, counseling and assimilation of information, exam, discussion of plan, and chart completion.    Assessment & Plan:  Teresa Castaneda is a 50 y.o. female . Type 2 diabetes mellitus with diabetic neuropathy, without long-term current use of insulin (HCC) Type 2 diabetes mellitus with hyperglycemia, without long-term current use of  insulin ([place])  -On new medication Farxiga from endocrinology.  We will try higher dosing of gabapentin at bedtime to see if that may be more effective.  Continue follow-up with endocrinology for diabetes.  Update on gabapentin effectiveness in 1 month.  Potential side effects  discussed.  Need for hepatitis B vaccination - Plan: Heplisav-B (HepB-CPG) Vaccine  Psychophysiological insomnia - Plan: escitalopram (LEXAPRO) 20 MG tablet Anxiety state - Plan: escitalopram (LEXAPRO) 20 MG tablet  -Melatonin discussed for sleep.  Suspect component of adjustment disorder with her health history but with increased anxiety/irritability we will try higher dose of Lexapro at 20 mg daily.  Update by video visit 1 month.  Sooner if needed.  Meds ordered this encounter  Medications   escitalopram (LEXAPRO) 20 MG tablet    Sig: Take 1 tablet (20 mg total) by mouth daily.    Dispense:  90 tablet    Refill:  1   Patient Instructions  Can try 2 gabapentin in the evening, 1 in the morning and let me know how that is doing for nerve symptoms in next few weeks.   See info on sleep below. Ok to try low dose melatonin (252mg up to 1 mg initially) over the counter. We can try higher dose Lexapro for now as well. Virtual visit in 1 month to follow up, but let me know if there are questions sooner.    Third hepatitis B vaccine and second hepatitis A vaccine in 5 months.  Insomnia Insomnia is a sleep disorder that makes it difficult to fall asleep or stay asleep. Insomnia can cause fatigue, low energy, difficulty concentrating, moodswings, and poor performance at work or school. There are three different ways to classify insomnia: Difficulty falling asleep. Difficulty staying asleep. Waking up too early in the morning. Any type of insomnia can be long-term (chronic) or short-term (acute). Both are common. Short-term insomnia usually lasts for three months or less. Chronic insomnia occurs at least three times a week for longer than threemonths. What are the causes? Insomnia may be caused by another condition, situation, or substance, such as: Anxiety. Certain medicines. Gastroesophageal reflux disease (GERD) or other gastrointestinal conditions. Asthma or other breathing  conditions. Restless legs syndrome, sleep apnea, or other sleep disorders. Chronic pain. Menopause. Stroke. Abuse of alcohol, tobacco, or illegal drugs. Mental health conditions, such as depression. Caffeine. Neurological disorders, such as Alzheimer's disease. An overactive thyroid (hyperthyroidism). Sometimes, the cause of insomnia may not be known. What increases the risk? Risk factors for insomnia include: Gender. Women are affected more often than men. Age. Insomnia is more common as you get older. Stress. Lack of exercise. Irregular work schedule or working night shifts. Traveling between different time zones. Certain medical and mental health conditions. What are the signs or symptoms? If you have insomnia, the main symptom is having trouble falling asleep or having trouble staying asleep. This may lead to other symptoms, such as: Feeling fatigued or having low energy. Feeling nervous about going to sleep. Not feeling rested in the morning. Having trouble concentrating. Feeling irritable, anxious, or depressed. How is this diagnosed? This condition may be diagnosed based on: Your symptoms and medical history. Your health care provider may ask about: Your sleep habits. Any medical conditions you have. Your mental health. A physical exam. How is this treated? Treatment for insomnia depends on the cause. Treatment may focus on treating an underlying condition that is causing insomnia. Treatment may also include: Medicines to help you sleep. Counseling or therapy.  Lifestyle adjustments to help you sleep better. Follow these instructions at home: Eating and drinking  Limit or avoid alcohol, caffeinated beverages, and cigarettes, especially close to bedtime. These can disrupt your sleep. Do not eat a large meal or eat spicy foods right before bedtime. This can lead to digestive discomfort that can make it hard for you to sleep.  Sleep habits  Keep a sleep diary to help  you and your health care provider figure out what could be causing your insomnia. Write down: When you sleep. When you wake up during the night. How well you sleep. How rested you feel the next day. Any side effects of medicines you are taking. What you eat and drink. Make your bedroom a dark, comfortable place where it is easy to fall asleep. Put up shades or blackout curtains to block light from outside. Use a white noise machine to block noise. Keep the temperature cool. Limit screen use before bedtime. This includes: Watching TV. Using your smartphone, tablet, or computer. Stick to a routine that includes going to bed and waking up at the same times every day and night. This can help you fall asleep faster. Consider making a quiet activity, such as reading, part of your nighttime routine. Try to avoid taking naps during the day so that you sleep better at night. Get out of bed if you are still awake after 15 minutes of trying to sleep. Keep the lights down, but try reading or doing a quiet activity. When you feel sleepy, go back to bed.  General instructions Take over-the-counter and prescription medicines only as told by your health care provider. Exercise regularly, as told by your health care provider. Avoid exercise starting several hours before bedtime. Use relaxation techniques to manage stress. Ask your health care provider to suggest some techniques that may work well for you. These may include: Breathing exercises. Routines to release muscle tension. Visualizing peaceful scenes. Make sure that you drive carefully. Avoid driving if you feel very sleepy. Keep all follow-up visits as told by your health care provider. This is important. Contact a health care provider if: You are tired throughout the day. You have trouble in your daily routine due to sleepiness. You continue to have sleep problems, or your sleep problems get worse. Get help right away if: You have serious  thoughts about hurting yourself or someone else. If you ever feel like you may hurt yourself or others, or have thoughts about taking your own life, get help right away. You can go to your nearest emergency department or call: Your local emergency services (911 in the [place] A suicide crisis helpline, such as the [facility] at 365-646-0117. This is open 24 hours a day. Summary Insomnia is a sleep disorder that makes it difficult to fall asleep or stay asleep. Insomnia can be long-term (chronic) or short-term (acute). Treatment for insomnia depends on the cause. Treatment may focus on treating an underlying condition that is causing insomnia. Keep a sleep diary to help you and your health care provider figure out what could be causing your insomnia. This information is not intended to replace advice given to you by your health care provider. Make sure you discuss any questions you have with your healthcare provider. Document Revised: 11/13/2019 Document Reviewed: 11/13/2019 Elsevier Patient Education  2022 [place],   Merri Ray, MD [place] 07/09/20 11:55 AM

## 2020-07-09 NOTE — Progress Notes (Signed)
Name: Teresa Castaneda  Age/ Sex: 50 y.o., female   MRN/ DOB: [id number], 12/19/1970     PCP: Wendie Agreste, MD   Reason for Endocrinology Evaluation: Type 2 Diabetes Mellitus  Initial Endocrine Consultative Visit: 04/05/2020    PATIENT IDENTIFIER: [name] is a 50 y.o. female with a past medical history of T2DM and Hypothyroidism. The patient has followed with Endocrinology clinic since 04/05/2020 for consultative assistance with management of her diabetes.  DIABETIC HISTORY:  Teresa Castaneda was diagnosed with DM in 08/2019, Metformin started 08/2019. Her hemoglobin A1c has ranged from 8.1% in 2022, peaking at 9.8% in 2022. Has mild upset stomach since increasing metformin but its transient     THYROID HISTORY: She has been diagnosed with hypothyroidism ~ 5 yrs ago No prior sx or radiation to the neck She was on Levothyroxine in he past but that didn't  work   Pt is on Armour thyroid 180 mg daily and 15 mg daily , we stopped 50 mg of Armour Thyroid which normalized her TSH.  SUBJECTIVE:   During the last visit (04/05/2020): A1c 9.8% she opted not to add anything to the metformin as she had just started it  Today (07/09/2020): Teresa Castaneda  is here for a follow up on diabetes management. She checks her blood sugars occasionally . The patient has not had hypoglycemic episodes since the last clinic visit  Weight stable  Has occasional diarrhea  Has tightness the legs , probably due to gabapentin   Recent diagnosis of cirrhosis   HOME DIABETES REGIMEN:  Metformin 1000 mg BID - taking 850 mg BID  Armour thyroid 180 mg daily    Statin: Yes ACE-I/ARB: No Prior Diabetic Education: No   METER DOWNLOAD SUMMARY:  73  - 254 mg/dL     DIABETIC COMPLICATIONS: Microvascular complications:   Denies: CKD, retinopathy, neuropathy Last Eye Exam: Completed 03/2020  Macrovascular complications:   Denies: CAD, CVA, PVD   HISTORY:  Past Medical History:  Past Medical  History:  Diagnosis Date   Allergies    Anxiety    Arthritis    Asthma    Back pain    Spinal Issue    Diabetes mellitus without complication (HCC)    DJD (degenerative joint disease), lumbar    Elevated cholesterol    Gallstone    Obesity    Thyroid disease    Past Surgical History:  Past Surgical History:  Procedure Laterality Date   ABDOMINAL HYSTERECTOMY  02/21/2003   CHOLECYSTECTOMY N/A 05/10/2020   Procedure: LAPAROSCOPIC CHOLECYSTECTOMY WITH INTRAOPERATIVE CHOLANGIOGRAM AND LIVER BIOPSY;  Surgeon: Felicie Morn, MD;  Location: [facility];  Service: General;  Laterality: N/A;   LIVER BIOPSY     Social History:  reports that she quit smoking about 9 months ago. Her smoking use included cigarettes. She started smoking about 35 years ago. She smoked an average of 0.50 packs per day. She has never used smokeless tobacco. She reports that she does not drink alcohol and does not use drugs. Family History:  Family History  Problem Relation Age of Onset   Coronary artery disease Mother    Diabetes Mother    COPD Mother    Coronary artery disease Father    Hypertension Father    Colon cancer Neg Hx    Esophageal cancer Neg Hx      HOME MEDICATIONS: Allergies as of 07/09/2020       Reactions   Clarithromycin Other (See Comments)  Body cramps    Codeine Nausea And Vomiting        Medication List        Accurate as of July 09, 2020  8:07 AM. If you have any questions, ask your nurse or doctor.          albuterol 108 (90 Base) MCG/ACT inhaler Commonly known as: VENTOLIN HFA Inhale 2 puffs into the lungs every 6 (six) hours as needed. For shortness of breath and wheezing   Armour Thyroid 180 MG tablet Generic drug: thyroid Take 180 mg by mouth daily.   atorvastatin 10 MG tablet Commonly known as: LIPITOR Take 1 tablet (10 mg total) by mouth daily.   Azelastine-Fluticasone 137-50 MCG/ACT Susp Commonly known as: Dymista Place 1 spray into the nose in  the morning and at bedtime.   escitalopram 10 MG tablet Commonly known as: Lexapro Take 1 tablet (10 mg total) by mouth daily.   gabapentin 100 MG capsule Commonly known as: NEURONTIN Take 1 capsule (100 mg total) by mouth 3 (three) times daily. Initially start once per day, increast to BID after 1 week if tolerated then tid if needed.   ibuprofen 600 MG tablet Commonly known as: ADVIL Take 600 mg by mouth as needed.   lactulose 10 GM/15ML solution Commonly known as: CHRONULAC Take 30 mLs (20 g total) by mouth daily. Please titrate to 1-2 softer bowel movements a day   levocetirizine 5 MG tablet Commonly known as: XYZAL Take 5 mg by mouth every evening.   LORazepam 1 MG tablet Commonly known as: ATIVAN Take 1 mg by mouth every 8 (eight) hours as needed for anxiety.   metFORMIN 850 MG tablet Commonly known as: Glucophage Take 1 tablet (850 mg total) by mouth 2 (two) times daily with a meal.   omeprazole 40 MG capsule Commonly known as: PRILOSEC Take 1 capsule (40 mg total) by mouth daily.   ondansetron 4 MG tablet Commonly known as: ZOFRAN Take 1 tablet (4 mg total) by mouth every 6 (six) hours.   oxycodone 5 MG capsule Commonly known as: OXY-IR Take 1 capsule (5 mg total) by mouth every 8 (eight) hours as needed.   phenazopyridine 200 MG tablet Commonly known as: Pyridium Take 1 tablet (200 mg total) by mouth 3 (three) times daily as needed for pain.   rifaximin 550 MG Tabs tablet Commonly known as: XIFAXAN Take 1 tablet (550 mg total) by mouth 2 (two) times daily.   tamsulosin 0.4 MG Caps capsule Commonly known as: FLOMAX Take 1 capsule (0.4 mg total) by mouth daily.   traMADol 50 MG tablet Commonly known as: Ultram Take 1 tablet (50 mg total) by mouth every 6 (six) hours as needed.   Trelegy Ellipta 100-62.5-25 MCG/INH Aepb Generic drug: Fluticasone-Umeclidin-Vilant Inhale 1 puff into the lungs daily.         OBJECTIVE:   Vital Signs: There were  no vitals taken for this visit.  Wt Readings from Last 3 Encounters:  06/29/20 256 lb (116.1 kg)  06/22/20 261 lb 12.8 oz (118.8 kg)  06/09/20 267 lb 3.2 oz (121.2 kg)     Exam: General: Pt appears well and is in NAD  Neck: General: Supple without adenopathy. Thyroid:  No goiter or nodules appreciated.   Lungs: Clear with good BS bilat with no rales, rhonchi, or wheezes  Heart: RRR with normal S1 and S2 and no gallops; no murmurs; no rub  Extremities: No pretibial edema.   Neuro: MS is good  with appropriate affect, pt is alert and Ox3     DATA REVIEWED: Results for Teresa, Castaneda (MRN [id number]) as of 07/09/2020 17:00  Ref. Range 07/09/2020 09:42  TSH Latest Ref Range: 0.35 - 4.50 uIU/mL 0.55   Lab Results  Component Value Date   HGBA1C 8.1 (A) 06/09/2020   HGBA1C 9.8 (H) 02/12/2020   Lab Results  Component Value Date   LDLCALC 97 01/28/2020   CREATININE 0.47 06/22/2020   Lab Results  Component Value Date   MICRALBCREAT 11 02/12/2020     Lab Results  Component Value Date   CHOL 158 01/28/2020   HDL 43 01/28/2020   LDLCALC 97 01/28/2020   TRIG 100 01/28/2020   CHOLHDL 3.7 01/28/2020      Results for Teresa Castaneda, Teresa Castaneda (MRN [id number]) as of 07/09/2020 09:04  Ref. Range 04/12/2020 08:04  Cortisol (Ur), Free Latest Ref Range: 4.0 - 50.0 mcg/24 h 9.3     ASSESSMENT / PLAN / RECOMMENDATIONS:   1) Type 2 Diabetes Mellitus, suboptimally controlled, With out complications - Most recent A1c of 8.1 %. Goal A1c < 7.0 %.    -Her A1c is down from 9.8% -She understands that the goal is less than 7.0% I have recommended add-on therapy with SGLT2 inhibitors, we discussed the cardiovascular benefits as well as the risk of genital infections    MEDICATIONS: Continue Metformin 850 mg, 1 tablet with Breakfast and Supper  Start Farxiga 5 mg daily   EDUCATION / INSTRUCTIONS: BG monitoring instructions: Patient is instructed to check her blood sugars 1 times a day,  fasting. Call [facility] if: BG persistently < 70  I reviewed the Rule of 15 for the treatment of hypoglycemia in detail with the patient. Literature supplied.    2) Diabetic complications:  Eye: Does not have known diabetic retinopathy.  Neuro/ Feet: Does not have known diabetic peripheral neuropathy .  Renal: Patient does not have known baseline CKD. She   is not on an ACEI/ARB at present.    3) Hypothyroidism:   -  I have advised the patient in the past for my preference to go with Levothyroxine rather then armour thyroid, due to more stability with T4 content in levothyroxine and also armour thyroid has non-physiologic levels of T4:T3, but she has opted to continue on Armour Thyroid -Repeat TSH is normal we will continue current dose   Medication  Continue armour thyroid 180 mg daily     F/U in 3 months   Signed electronically by: Mack Guise, MD  [facility] End[facility]in[facility]  [address] [place], [place] [address] Phone: [phone] FAX: [phone]   CC: Wendie Agreste, MD [address] [address] Phone: [phone]  Fax: [phone]  Return to Endocrinology clinic as below: Future Appointments  Date Time Provider [place]  07/09/2020  9:10 AM Toddrick Sanna, Melanie Crazier, MD [facility] None  07/09/2020 11:00 AM Wendie Agreste, MD [facility]  07/12/2020  1:30 PM GI-315 DG C-ARM RM 3 GI-315DG GI-[facility] W. WE  07/27/2020  2:30 PM Jackquline Denmark, MD [facility]

## 2020-07-12 ENCOUNTER — Other Ambulatory Visit: Payer: Self-pay

## 2020-07-12 ENCOUNTER — Ambulatory Visit
Admission: RE | Admit: 2020-07-12 | Discharge: 2020-07-12 | Disposition: A | Discharge status: Home / Self Care | Payer: Commercial Managed Care - PPO | Source: Ambulatory Visit | Attending: Sports Medicine | Admitting: Sports Medicine

## 2020-07-12 DIAGNOSIS — M461 Sacroiliitis, not elsewhere classified: Secondary | ICD-10-CM

## 2020-07-12 MED ORDER — METHYLPREDNISOLONE ACETATE 40 MG/ML INJ SUSP (RADIOLOG
80.0000 mg | Freq: Once | INTRAMUSCULAR | Status: AC
  Administered 2020-07-12: 80 mg via INTRA_ARTICULAR

## 2020-07-12 MED ORDER — IOPAMIDOL (ISOVUE-M 200) INJECTION 41%
1.0000 mL | Freq: Once | INTRAMUSCULAR | Status: AC
  Administered 2020-07-12: 1 mL via INTRA_ARTICULAR

## 2020-07-12 NOTE — Discharge Instructions (Signed)

## 2020-07-27 ENCOUNTER — Encounter: Payer: Self-pay | Admitting: Gastroenterology

## 2020-07-27 ENCOUNTER — Ambulatory Visit (AMBULATORY_SURGERY_CENTER): Payer: Commercial Managed Care - PPO | Admitting: Gastroenterology

## 2020-07-27 ENCOUNTER — Other Ambulatory Visit: Payer: Self-pay

## 2020-07-27 VITALS — BP 113/66 | HR 96 | Temp 97.1°F | Resp 25 | Ht 66.0 in | Wt 265.0 lb

## 2020-07-27 DIAGNOSIS — K3189 Other diseases of stomach and duodenum: Secondary | ICD-10-CM | POA: Diagnosis not present

## 2020-07-27 DIAGNOSIS — K2100 Gastro-esophageal reflux disease with esophagitis, without bleeding: Secondary | ICD-10-CM | POA: Diagnosis not present

## 2020-07-27 DIAGNOSIS — D122 Benign neoplasm of ascending colon: Secondary | ICD-10-CM

## 2020-07-27 DIAGNOSIS — R197 Diarrhea, unspecified: Secondary | ICD-10-CM

## 2020-07-27 DIAGNOSIS — K635 Polyp of colon: Secondary | ICD-10-CM | POA: Diagnosis not present

## 2020-07-27 DIAGNOSIS — K7581 Nonalcoholic steatohepatitis (NASH): Secondary | ICD-10-CM | POA: Diagnosis not present

## 2020-07-27 DIAGNOSIS — D125 Benign neoplasm of sigmoid colon: Secondary | ICD-10-CM

## 2020-07-27 DIAGNOSIS — R1011 Right upper quadrant pain: Secondary | ICD-10-CM

## 2020-07-27 DIAGNOSIS — K621 Rectal polyp: Secondary | ICD-10-CM | POA: Diagnosis not present

## 2020-07-27 DIAGNOSIS — K219 Gastro-esophageal reflux disease without esophagitis: Secondary | ICD-10-CM

## 2020-07-27 DIAGNOSIS — D128 Benign neoplasm of rectum: Secondary | ICD-10-CM

## 2020-07-27 DIAGNOSIS — K641 Second degree hemorrhoids: Secondary | ICD-10-CM

## 2020-07-27 MED ORDER — SODIUM CHLORIDE 0.9 % IV SOLN: 500.0000 mL | Freq: Once | INTRAVENOUS | Status: DC

## 2020-07-27 NOTE — Progress Notes (Signed)
Report to PACU, RN, vss, BBS= Clear.  

## 2020-07-27 NOTE — Patient Instructions (Signed)
Please read handouts provided. Continue present medications. Await pathology results.   YOU HAD AN ENDOSCOPIC PROCEDURE TODAY AT [facility]:   Refer to the procedure report that was given to you for any specific questions about what was found during the examination.  If the procedure report does not answer your questions, please call your gastroenterologist to clarify.  If you requested that your care partner not be given the details of your procedure findings, then the procedure report has been included in a sealed envelope for you to review at your convenience later.  YOU SHOULD EXPECT: Some feelings of bloating in the abdomen. Passage of more gas than usual.  Walking can help get rid of the air that was put into your GI tract during the procedure and reduce the bloating. If you had a lower endoscopy (such as a colonoscopy or flexible sigmoidoscopy) you may notice spotting of blood in your stool or on the toilet paper. If you underwent a bowel prep for your procedure, you may not have a normal bowel movement for a few days.  Please Note:  You might notice some irritation and congestion in your nose or some drainage.  This is from the oxygen used during your procedure.  There is no need for concern and it should clear up in a day or so.  SYMPTOMS TO REPORT IMMEDIATELY:  Following lower endoscopy (colonoscopy or flexible sigmoidoscopy):  Excessive amounts of blood in the stool  Significant tenderness or worsening of abdominal pains  Swelling of the abdomen that is new, acute  Fever of 100F or higher  Following upper endoscopy (EGD)  Vomiting of blood or coffee ground material  New chest pain or pain under the shoulder blades  Painful or persistently difficult swallowing  New shortness of breath  Fever of 100F or higher  Black, tarry-looking stools  For urgent or emergent issues, a gastroenterologist can be reached at any hour by calling [phone]. Do not use  MyChart messaging for urgent concerns.    DIET:  We do recommend a small meal at first, but then you may proceed to your regular diet.  Drink plenty of fluids but you should avoid alcoholic beverages for 24 hours.  ACTIVITY:  You should plan to take it easy for the rest of today and you should NOT DRIVE or use heavy machinery until tomorrow (because of the sedation medicines used during the test).    FOLLOW UP: Our staff will call the number listed on your records 48-72 hours following your procedure to check on you and address any questions or concerns that you may have regarding the information given to you following your procedure. If we do not reach you, we will leave a message.  We will attempt to reach you two times.  During this call, we will ask if you have developed any symptoms of COVID 19. If you develop any symptoms (ie: fever, flu-like symptoms, shortness of breath, cough etc.) before then, please call [phone].  If you test positive for Covid 19 in the 2 weeks post procedure, please call and report this information to [place].    If any biopsies were taken you will be contacted by phone or by letter within the next 1-3 weeks.  Please call us at [phone] if you have not heard about the biopsies in 3 weeks.    SIGNATURES/CONFIDENTIALITY: You and/or your care partner have signed paperwork which will be entered into your electronic medical record.  These signatures  attest to the fact that that the information above on your After Visit Summary has been reviewed and is understood.  Full responsibility of the confidentiality of this discharge information lies with you and/or your care-partner.

## 2020-07-27 NOTE — Op Note (Signed)
[facility] Patient Name: Teresa Castaneda Procedure Date: 07/27/2020 2:57 PM MRN: [id number] Endoscopist: Jackquline Denmark , MD Age: 50 Referring MD:  Date of Birth: October 06, 1970 Gender: Female Account #: [id number] Procedure:                Upper GI endoscopy Indications:              GERD. Recently diagnosed liver cirrhosis with                            thrombocytopenia (plt 117K). Rule out esophageal                            varices. Medicines:                Monitored Anesthesia Care Procedure:                Pre-Anesthesia Assessment:                           - Prior to the procedure, a History and Physical                            was performed, and patient medications and                            allergies were reviewed. The patient's tolerance of                            previous anesthesia was also reviewed. The risks                            and benefits of the procedure and the sedation                            options and risks were discussed with the patient.                            All questions were answered, and informed consent                            was obtained. Prior Anticoagulants: The patient has                            taken no previous anticoagulant or antiplatelet                            agents. ASA Grade Assessment: III - A patient with                            severe systemic disease. After reviewing the risks                            and benefits, the patient was deemed in  satisfactory condition to undergo the procedure.                           After obtaining informed consent, the endoscope was                            passed under direct vision. Throughout the                            procedure, the patient's blood pressure, pulse, and                            oxygen saturations were monitored continuously. The                            GIF [id number] #[id number] was introduced through the                             mouth, and advanced to the second part of duodenum.                            The upper GI endoscopy was accomplished without                            difficulty. The patient tolerated the procedure                            well. Scope In: Scope Out: Findings:                 The examined esophagus was normal. No esophageal                            varices.                           The Z-line was regular and was found 35 cm from the                            incisors with salmon-colored mucosa projecting 1 cm                            above the GE junction. Biopsies were taken with a                            cold forceps for histology to r/o Barrett's                            esophagus, directed by NBI.                           Diffuse minimal inflammation characterized by                            erythema was found in the entire examined stomach.  Biopsies were taken with a cold forceps for                            histology. No fundal varices.                           The examined duodenum was normal. Biopsies for                            histology were taken with a cold forceps for                            evaluation of celiac disease. Complications:            No immediate complications. Estimated Blood Loss:     Estimated blood loss: none. Impression:               - Mild gastritis.                           - Salmon-colored mucosa projecting 1 cm above GE                            junction. Biopsied to rule out Barrett's esophagus.                           - No esophageal varices. Recommendation:           - Patient has a contact number available for                            emergencies. The signs and symptoms of potential                            delayed complications were discussed with the                            patient. Return to normal activities tomorrow.                            Written  discharge instructions were provided to the                            patient.                           - Resume previous diet.                           - Continue present medications.                           - Await pathology results.                           - The findings and recommendations were discussed  with the patient's family. Jackquline Denmark, MD 07/27/2020 3:39:32 PM This report has been signed electronically.

## 2020-07-27 NOTE — Progress Notes (Signed)
Medical history reviewed. VS assessed by [facility]

## 2020-07-27 NOTE — Progress Notes (Signed)
Called to room to assist during endoscopic procedure.  Patient ID and intended procedure confirmed with present staff. Received instructions for my participation in the procedure from the performing physician.  

## 2020-07-27 NOTE — Op Note (Signed)
[place] Patient Name: Teresa Castaneda Procedure Date: 07/27/2020 2:55 PM MRN: [id number] Endoscopist: Jackquline Denmark , MD Age: 50 Referring MD:  Date of Birth: 1970/07/13 Gender: Female Account #: [id number] Procedure:                Colonoscopy Indications:              Screening for colorectal malignant neoplasm. H/O                            diarrhea. Medicines:                Monitored Anesthesia Care Procedure:                Pre-Anesthesia Assessment:                           - Prior to the procedure, a History and Physical                            was performed, and patient medications and                            allergies were reviewed. The patient's tolerance of                            previous anesthesia was also reviewed. The risks                            and benefits of the procedure and the sedation                            options and risks were discussed with the patient.                            All questions were answered, and informed consent                            was obtained. Prior Anticoagulants: The patient has                            taken no previous anticoagulant or antiplatelet                            agents. ASA Grade Assessment: III - A patient with                            severe systemic disease. After reviewing the risks                            and benefits, the patient was deemed in                            satisfactory condition to undergo the procedure.  After obtaining informed consent, the colonoscope                            was passed under direct vision. Throughout the                            procedure, the patient's blood pressure, pulse, and                            oxygen saturations were monitored continuously. The                            CF [id number] #[id number] was introduced through the anus                            and advanced to the 2 cm into the ileum. The                             colonoscopy was performed without difficulty. The                            patient tolerated the procedure well. The quality                            of the bowel preparation was good. The terminal                            ileum, ileocecal valve, appendiceal orifice, and                            rectum were photographed. Scope In: 3:12:19 PM Scope Out: 3:33:15 PM Scope Withdrawal Time: 0 hours 17 minutes 0 seconds  Total Procedure Duration: 0 hours 20 minutes 56 seconds  Findings:                 A 6 mm polyp was found in the proximal ascending                            colon. The polyp was sessile. The polyp was removed                            with a cold snare. Resection and retrieval were                            complete.                           Four sessile polyps were found in the mid rectum                            and distal sigmoid colon. The polyps were 6 to 8 mm                            in  size. These polyps were removed with a cold                            snare. Resection and retrieval were complete.                           The colon (entire examined portion) appeared                            normal. Biopsies for histology were taken with a                            cold forceps from the entire colon for evaluation                            of microscopic colitis.                           Non-bleeding internal hemorrhoids were found during                            retroflexion and during perianal exam. The                            hemorrhoids were small and Grade II (internal                            hemorrhoids that prolapse but reduce spontaneously).                           The terminal ileum appeared normal.                           The exam was otherwise without abnormality on                            direct and retroflexion views. Complications:            No immediate complications. Estimated Blood Loss:      Estimated blood loss: none. Impression:               - One 6 mm polyp in the proximal ascending colon,                            removed with a cold snare. Resected and retrieved.                           - Four 6 to 8 mm polyps in the mid rectum and in                            the distal sigmoid colon, removed with a cold                            snare. Resected and retrieved.                           -  Non-bleeding internal hemorrhoids.                           - The examined portion of the ileum was normal.                           - The examination was otherwise normal on direct                            and retroflexion views. Recommendation:           - Patient has a contact number available for                            emergencies. The signs and symptoms of potential                            delayed complications were discussed with the                            patient. Return to normal activities tomorrow.                            Written discharge instructions were provided to the                            patient.                           - Resume previous diet.                           - Continue present medications.                           - Await pathology results.                           - Repeat colonoscopy for surveillance based on                            pathology results.                           - The findings and recommendations were discussed                            with the patient's family. Jackquline Denmark, MD 07/27/2020 3:43:19 PM This report has been signed electronically.

## 2020-07-28 ENCOUNTER — Telehealth: Payer: Self-pay | Admitting: Gastroenterology

## 2020-07-28 NOTE — Telephone Encounter (Signed)
Pt c/o severe diarrhea after colonoscopy. She was having this prior to procedure as well. She is supposed to go back to work tomorrow but wanted to know if she could take anything in the meantime until the bx results come back for the diarrhea. Will notify Dr. Lyndel Safe.

## 2020-07-28 NOTE — Telephone Encounter (Signed)
Inbound call from pt requesting a call back stating she is experiencing severe diarrhea. Please advise. Thanks

## 2020-07-28 NOTE — Telephone Encounter (Signed)
Dr Lyndel Safe please advise per message below. Pt had Upper Endo 7/12

## 2020-07-29 ENCOUNTER — Other Ambulatory Visit: Payer: Self-pay

## 2020-07-29 ENCOUNTER — Telehealth: Payer: Self-pay | Admitting: *Deleted

## 2020-07-29 ENCOUNTER — Telehealth: Payer: Self-pay | Admitting: Gastroenterology

## 2020-07-29 DIAGNOSIS — R197 Diarrhea, unspecified: Secondary | ICD-10-CM

## 2020-07-29 MED ORDER — DIPHENOXYLATE-ATROPINE 2.5-0.025 MG PO TABS: 1.0000 | ORAL_TABLET | Freq: Three times a day (TID) | ORAL | 0 refills | Status: DC | PRN

## 2020-07-29 NOTE — Telephone Encounter (Signed)
See my chart message

## 2020-07-29 NOTE — Telephone Encounter (Signed)
Just to document -Lomotil 1 tablet p.o. 3 times daily PRN #30 -GI pathogens, stool for calprotectin Let us know how she is in 3 days RG

## 2020-07-29 NOTE — Telephone Encounter (Signed)
  Follow up Call-  Call back number 07/27/2020  Post procedure Call Back phone  # [phone]  Permission to leave phone message Yes  Some recent data might be hidden     Patient questions:  Do you have a fever, pain , or abdominal swelling? No. Pain Score  0 *  Have you tolerated food without any problems? Yes.    Have you been able to return to your normal activities? Yes.    Do you have any questions about your discharge instructions: Diet   No. Medications  No. Follow up visit  No.  Do you have questions or concerns about your Care? No.  Actions: * If pain score is 4 or above: No action needed, pain <4.  Have you developed a fever since your procedure? no  2.   Have you had an respiratory symptoms (SOB or cough) since your procedure? no  3.   Have you tested positive for COVID 19 since your procedure no  4.   Have you had any family members/close contacts diagnosed with the COVID 19 since your procedure?  no   If yes to any of these questions please route to Joylene John, RN and Joella Prince, RN

## 2020-07-29 NOTE — Telephone Encounter (Signed)
Inbound call from pt requesting a call back stating that she is experiencing severe diarrhea and can not return to work and wants to know if there is anything she can take. Please advise. Thanks.

## 2020-07-29 NOTE — Telephone Encounter (Signed)
On follow up call this morning, pt states that she continued to have diarrhea throughout the night.  She states that she has urgency and sometimes doesn't make it to the bathroom.  She is asking to take something for imodium but is unsure because of her "liver issues".  She is also requesting a work note because she has missed most of this week due to her constant diarrhea.  Dr. Lyndel Safe please advise.

## 2020-07-30 ENCOUNTER — Other Ambulatory Visit: Payer: Commercial Managed Care - PPO

## 2020-07-30 DIAGNOSIS — R197 Diarrhea, unspecified: Secondary | ICD-10-CM

## 2020-08-04 LAB — GI PROFILE, STOOL, PCR

## 2020-08-04 LAB — CALPROTECTIN, FECAL: Calprotectin, Fecal: <16 ug/g (ref 0–120)

## 2020-08-07 ENCOUNTER — Encounter: Payer: Self-pay | Admitting: Gastroenterology

## 2020-08-07 NOTE — Telephone Encounter (Signed)
Bx- all neg except for 1 precancerous polyp You will be receiving a letter RG

## 2020-08-11 ENCOUNTER — Other Ambulatory Visit: Payer: Self-pay | Admitting: Family Medicine

## 2020-08-11 ENCOUNTER — Other Ambulatory Visit: Payer: Self-pay | Admitting: Gastroenterology

## 2020-08-11 DIAGNOSIS — M47816 Spondylosis without myelopathy or radiculopathy, lumbar region: Secondary | ICD-10-CM

## 2020-08-12 ENCOUNTER — Other Ambulatory Visit: Payer: Self-pay

## 2020-08-12 DIAGNOSIS — E1140 Type 2 diabetes mellitus with diabetic neuropathy, unspecified: Secondary | ICD-10-CM

## 2020-08-12 MED ORDER — GABAPENTIN 100 MG PO CAPS: 100.0000 mg | ORAL_CAPSULE | Freq: Three times a day (TID) | ORAL | 1 refills | Status: DC

## 2020-08-12 NOTE — Telephone Encounter (Signed)
Refilled based on last OV plan.

## 2020-08-12 NOTE — Telephone Encounter (Signed)
Patient is requesting a refill of the following medications: Requested Prescriptions   Pending Prescriptions Disp Refills   gabapentin (NEURONTIN) 100 MG capsule 90 capsule 1    Sig: Take 1 capsule (100 mg total) by mouth 3 (three) times daily. Initially start once per day, increast to BID after 1 week if tolerated then tid if needed.    Date of patient request: 08/12/2020  Last office visit: 07/09/20 Date of last refill: 07/09/20 Last refill amount: 90 Follow up time period per chart: 1 month

## 2020-08-25 ENCOUNTER — Encounter: Payer: Self-pay | Admitting: Gastroenterology

## 2020-08-25 ENCOUNTER — Ambulatory Visit (INDEPENDENT_AMBULATORY_CARE_PROVIDER_SITE_OTHER): Payer: Commercial Managed Care - PPO | Admitting: Gastroenterology

## 2020-08-25 ENCOUNTER — Other Ambulatory Visit: Payer: Self-pay

## 2020-08-25 VITALS — BP 136/84 | HR 85 | Ht 66.0 in | Wt 246.0 lb

## 2020-08-25 DIAGNOSIS — K7581 Nonalcoholic steatohepatitis (NASH): Secondary | ICD-10-CM | POA: Diagnosis not present

## 2020-08-25 DIAGNOSIS — R197 Diarrhea, unspecified: Secondary | ICD-10-CM

## 2020-08-25 DIAGNOSIS — K219 Gastro-esophageal reflux disease without esophagitis: Secondary | ICD-10-CM | POA: Diagnosis not present

## 2020-08-25 MED ORDER — CHOLESTYRAMINE 4 G PO PACK: 4.0000 g | PACK | Freq: Every day | ORAL | 11 refills | Status: DC

## 2020-08-25 MED ORDER — DIPHENOXYLATE-ATROPINE 2.5-0.025 MG PO TABS: 1.0000 | ORAL_TABLET | Freq: Every day | ORAL | 0 refills | Status: DC | PRN

## 2020-08-25 MED ORDER — PANTOPRAZOLE SODIUM 40 MG PO TBEC: 40.0000 mg | DELAYED_RELEASE_TABLET | Freq: Every day | ORAL | 11 refills | Status: DC

## 2020-08-25 NOTE — Progress Notes (Signed)
Chief Complaint: FU  Referring Provider:  Wendie Agreste, MD      ASSESSMENT AND PLAN;   #1. GERD with occ RUQ pain   #2. IBS-D with Post chole diarrhea. Neg SB Bx for celiac, neg random colon Bx for microscopic colitis. ?  Due to medicines like metformin, omeprazole  #3. 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 Neg extensive work-up. No ETOH.  AFP was borderline elevated.  Neg CT Abdo/pelvis for any liver masses 05/2020  #4. H/O biliary colic s/p lap chole with neg IOC and liver Bx 05/10/2020.  Plan: -Trial of Cholestramine 4g po QD, 2 hours before or after rest of the medications. -Stop lactulose -Change omeprazole to protonix 22m po qd -Lomotil 1 tab po qd prn #45. -Weight loss -FU in 3 months.  Earlier, if still with problems.  At follow-up, repeat alpha-fetoprotein. -She will let [place]ore[place] how she is in 2 weeks.     HPI:    Teresa DOMBROSKYis a 50y.o. female  With DM, obesity, DJD, HLD, anxiety  Had Biliary colic (RUQ pain) , underwent lap chole with neg IOC and liver Bx 05/10/2020. [facility]  She has H/O low platelets x about a year.  No nausea, vomiting, regurgitation, odynophagia or dysphagia.   Her main complaint today is that of diarrhea-normally would have 2-3 BMs per day.  However lately has been having 8-10 watery bowel movements per day.  She denies having any significant nocturnal symptoms however.  She has stopped taking lactulose but continues to have diarrhea.  Her stool studies were negative for GI pathogens and calprotectin.    Per patient, diarrhea has been occurring before she started on metformin.  She does give history of heartburn and indigestion.  Still has minimal RUQ abdo pain even after lap chole.  She does have FH of NASH cirrhosis (mom).  Unfortunately, her mom has passed away due to complications from diabetes.  H/O itching,   No skin lesions, easy  bruisability, intake of OTC meds including diet pills, herbal medications, anabolic steroids or Tylenol. There is no H/O blood transfusions, IVDA. No jaundice, dark urine or pale stools. No alcohol abuse.  No chocolates, chewing gums, artificial sweeteners and candy. No NSAIDs.  She would drink 4 Dr. PSamson Fredericper day.  Has reduced it to 2/day.  She has been able to reduce weight Wt Readings from Last 3 Encounters:  08/25/20 246 lb (111.6 kg)  07/27/20 265 lb (120.2 kg)  07/09/20 261 lb 3.2 oz (118.5 kg)      Past GI procedures:  Colon 07/2020 - One 6 mm polyp in the proximal ascending colon, removed with a cold snare. Resected and retrieved. Bx- TA - Four 6 to 8 mm polyps in the mid rectum and in the distal sigmoid colon, removed with a cold snare. Resected and retrieved. Bx-hyperplastic. - Non-bleeding internal hemorrhoids. - The examined portion of the ileum was normal. - The examination was otherwise normal on direct and retroflexion views. - Negative random colonic biopsies.  EGD 07/2020 - Mild gastritis. - Salmon-colored mucosa projecting 1 cm above GE junction. Bx- neg for Barrett's. - No esophageal varices. - Negative small bowel biopsies for celiac disease.  CT Abdo/pelvis with contrast 06/09/2020 1. Morphologic features of the liver compatible with cirrhosis. No suspicious liver lesions identified. 2. Splenomegaly. 3. Aortic atherosclerosis.  Additional liver work-up -AFP 6.5 10/15/2020 -ANA 1:40 -Neg AMA, celiac  serology, iron studies, alpha-1 antitrypsin, ASMA -Ammonia 55 -Not immune to hepatitis A/B, s/p vaccines    Additional GI history: Op note from lap chole 05/10/2020-reviewed Louanna Raw, MD General, Bariatric, & Minimally Invasive Surgery [facility], [place]) -Cirrhotic appearing liver. Bx-as above. -IOC negative -No ascites or any obvious varices  Past Medical History:  Diagnosis Date   Allergies    Anxiety    Arthritis    Asthma     Back pain    Spinal Issue    Diabetes mellitus without complication (HCC)    DJD (degenerative joint disease), lumbar    Elevated cholesterol    Gallstone    GERD (gastroesophageal reflux disease)    Neuromuscular disorder (HCC)    Obesity    Oxygen deficiency    Thyroid disease     Past Surgical History:  Procedure Laterality Date   ABDOMINAL HYSTERECTOMY  02/21/2003   CHOLECYSTECTOMY N/A 05/10/2020   Procedure: LAPAROSCOPIC CHOLECYSTECTOMY WITH INTRAOPERATIVE CHOLANGIOGRAM AND LIVER BIOPSY;  Surgeon: Felicie Morn, MD;  Location: [facility];  Service: General;  Laterality: N/A;   LIVER BIOPSY      Family History  Problem Relation Age of Onset   Coronary artery disease Mother    Diabetes Mother    COPD Mother    Coronary artery disease Father    Hypertension Father    Colon cancer Neg Hx    Esophageal cancer Neg Hx     Social History   Tobacco Use   Smoking status: Former    Packs/day: 0.50    Types: Cigarettes    Start date: 01/16/1985    Quit date: 10/08/2019    Years since quitting: 0.8   Smokeless tobacco: Never  Vaping Use   Vaping Use: Never used  Substance Use Topics   Alcohol use: No   Drug use: No    Current Outpatient Medications  Medication Sig Dispense Refill   albuterol (VENTOLIN HFA) 108 (90 Base) MCG/ACT inhaler Inhale 2 puffs into the lungs every 6 (six) hours as needed. For shortness of breath and wheezing 18 g 0   ARMOUR THYROID 180 MG tablet Take 180 mg by mouth daily.     atorvastatin (LIPITOR) 10 MG tablet Take 1 tablet (10 mg total) by mouth daily. 90 tablet 3   Azelastine-Fluticasone (DYMISTA) 137-50 MCG/ACT SUSP Place 1 spray into the nose in the morning and at bedtime. 23 g 6   dapagliflozin propanediol (FARXIGA) 5 MG TABS tablet Take 1 tablet (5 mg total) by mouth daily before breakfast. 30 tablet 6   diphenoxylate-atropine (LOMOTIL) 2.5-0.025 MG tablet Take 1 tablet by mouth 3 (three) times daily as needed for diarrhea or loose  stools. 30 tablet 0   escitalopram (LEXAPRO) 20 MG tablet Take 1 tablet (20 mg total) by mouth daily. 90 tablet 1   Fluticasone-Umeclidin-Vilant (TRELEGY ELLIPTA) 100-62.5-25 MCG/INH AEPB Inhale 1 puff into the lungs daily. 1 each 6   gabapentin (NEURONTIN) 100 MG capsule Take 1 capsule (100 mg total) by mouth 3 (three) times daily. 1 po Qam, 2 po Qpm. 90 capsule 1   ibuprofen (ADVIL) 600 MG tablet Take 600 mg by mouth as needed.     lactulose (CHRONULAC) 10 GM/15ML solution Take 30 mLs (20 g total) by mouth daily. Please titrate to 1-2 softer bowel movements a day 900 mL 2   levocetirizine (XYZAL) 5 MG tablet Take 5 mg by mouth every evening.     lidocaine (LIDODERM) 5 % USE 1 PATCH  EXTERNALLY ONCE DAILY REMOVE  AND  DISCARD  PATCH  WITH  12  HOURS  OR  AS  DIRECTED  BY  MD 30 patch 0   LORazepam (ATIVAN) 1 MG tablet Take 1 mg by mouth every 8 (eight) hours as needed for anxiety.     metFORMIN (GLUCOPHAGE) 850 MG tablet Take 1 tablet (850 mg total) by mouth 2 (two) times daily with a meal. 60 tablet 3   omeprazole (PRILOSEC) 40 MG capsule Take 1 capsule (40 mg total) by mouth daily. 90 capsule 1   ondansetron (ZOFRAN) 4 MG tablet Take 1 tablet (4 mg total) by mouth every 6 (six) hours. 12 tablet 0   rifaximin (XIFAXAN) 550 MG TABS tablet Take 1 tablet (550 mg total) by mouth 2 (two) times daily. 60 tablet 3   tamsulosin (FLOMAX) 0.4 MG CAPS capsule Take 1 capsule (0.4 mg total) by mouth daily. 30 capsule 3   No current facility-administered medications for this visit.    Allergies  Allergen Reactions   Clarithromycin Other (See Comments)    Body cramps    Codeine Nausea And Vomiting    Review of Systems:  Constitutional: Denies fever, chills, diaphoresis, appetite change and has fatigue.  HEENT: Has allergies Respiratory: Denies SOB, DOE, has occ cough, No chest tightness,  and wheezing.   Cardiovascular: Denies chest pain, palpitations and leg swelling.  Genitourinary: Denies dysuria,  urgency, frequency, hematuria, flank pain and difficulty urinating.  Musculoskeletal: Denies myalgias, has back pain, No joint swelling, arthralgias and gait problem.  Skin: No rash.  Neurological: Denies dizziness, seizures, syncope, weakness, light-headedness, numbness and has headaches.  Hematological: Denies adenopathy. Easy bruising, personal or family bleeding history  Psychiatric/Behavioral: No anxiety or depression. Has sleeping problems     Physical Exam:    BP 136/84   Pulse 85   Ht _0  (1.676 m)   Wt 246 lb (111.6 kg)   SpO2 95%   BMI 39.71 kg/m  Wt Readings from Last 3 Encounters:  08/25/20 246 lb (111.6 kg)  07/27/20 265 lb (120.2 kg)  07/09/20 261 lb 3.2 oz (118.5 kg)   Constitutional:  Well-developed, in no acute distress. Psychiatric: Normal mood and affect. Behavior is normal. HEENT: Pupils normal.  Conjunctivae are normal. No scleral icterus. Neck supple.  Cardiovascular: Normal rate, regular rhythm. No edema Pulmonary/chest: Effort normal and breath sounds normal. No wheezing, rales or rhonchi. Abdominal: Soft, nondistended. Nontender. Bowel sounds active throughout. There are no masses palpable. No hepatomegaly.  No ascites. Rectal: Deferred Neurological: Alert and oriented to person place and time. Skin: Skin is warm and dry. No rashes noted. Extremities-no edema  Data Reviewed: I have personally reviewed following labs and imaging studies  CBC: CBC Latest Ref Rng & Units 06/22/2020 06/02/2020 05/10/2020  WBC 4.0 - 10.5 K/uL 6.4 7.1 6.5  Hemoglobin 12.0 - 15.0 g/dL 12.4 13.1 13.7  Hematocrit 36.0 - 46.0 % 37.5 39.0 41.3  Platelets 150.0 - 400.0 K/uL 117.0(L) 121.0(L) 126(L)    CMP: CMP Latest Ref Rng & Units 06/22/2020 06/02/2020 05/10/2020  Glucose 70 - 99 mg/dL 165(H) 170(H) 216(H)  BUN 6 - 23 mg/dL _1 Creatinine 0.40 - 1.20 mg/dL 0.47 0.44 0.51  Sodium 135 - 145 mEq/L 134(L) 136 135  Potassium 3.5 - 5.1 mEq/L 3.9 4.1 3.9  Chloride 96 - 112  mEq/L 101 103 101  CO2 19 - 32 mEq/L _2 Calcium 8.4 - 10.5 mg/dL 9.0 8.7 9.0  Total Protein 6.0 - 8.3 g/dL 6.8 6.9 7.4  Total Bilirubin 0.2 - 1.2 mg/dL 0.5 1.0 0.8  Alkaline Phos 39 - 117 U/L 86 88 82  AST 0 - 37 U/L 73(H) 43(H) 47(H)  ALT 0 - 35 U/L 57(H) 35 41   Hepatic Function Latest Ref Rng & Units 06/22/2020 06/02/2020 05/10/2020  Total Protein 6.0 - 8.3 g/dL 6.8 6.9 7.4  Albumin 3.5 - 5.2 g/dL 3.4(L) 3.5 3.2(L)  AST 0 - 37 U/L 73(H) 43(H) 47(H)  ALT 0 - 35 U/L 57(H) 35 41  Alk Phosphatase 39 - 117 U/L 86 88 82  Total Bilirubin 0.2 - 1.2 mg/dL 0.5 1.0 0.8  Bilirubin, Direct 0.00 - 0.40 mg/dL - - -     Radiology Studies: No results found.     Carmell Austria, MD 08/25/2020, 3:41 PM  Cc: Wendie Agreste, MD

## 2020-08-25 NOTE — Patient Instructions (Signed)
If you are age 49 or older, your body mass index should be between 23-30. Your Body mass index is 39.71 kg/m. If this is out of the aforementioned range listed, please consider follow up with your Primary Care Provider.  If you are age 28 or younger, your body mass index should be between 19-25. Your Body mass index is 39.71 kg/m. If this is out of the aformentioned range listed, please consider follow up with your Primary Care Provider.   __________________________________________________________  The [facility] providers would like to encourage you to use [facility] to communicate with providers for non-urgent requests or questions.  Due to long hold times on the telephone, sending your provider a message by [facility] may be a faster and more efficient way to get a response.  Please allow 48 business hours for a response.  Please remember that this is for non-urgent requests.   We have sent the following medications to your pharmacy for you to pick up at your convenience: Lomotil Cholestyramine Protonix   Continue to try to lose weight.   Thank you,  Dr. Jackquline Denmark

## 2020-09-30 ENCOUNTER — Encounter (HOSPITAL_COMMUNITY): Payer: Self-pay

## 2020-09-30 ENCOUNTER — Ambulatory Visit (HOSPITAL_COMMUNITY)
Admission: EM | Admit: 2020-09-30 | Discharge: 2020-09-30 | Disposition: A | Discharge status: Home / Self Care | Payer: Commercial Managed Care - PPO | Attending: Family Medicine | Admitting: Family Medicine

## 2020-09-30 ENCOUNTER — Telehealth: Payer: Commercial Managed Care - PPO | Admitting: Registered Nurse

## 2020-09-30 ENCOUNTER — Other Ambulatory Visit: Payer: Self-pay

## 2020-09-30 DIAGNOSIS — H66005 Acute suppurative otitis media without spontaneous rupture of ear drum, recurrent, left ear: Secondary | ICD-10-CM | POA: Diagnosis not present

## 2020-09-30 DIAGNOSIS — Z20822 Contact with and (suspected) exposure to covid-19: Secondary | ICD-10-CM | POA: Diagnosis not present

## 2020-09-30 DIAGNOSIS — J069 Acute upper respiratory infection, unspecified: Secondary | ICD-10-CM | POA: Insufficient documentation

## 2020-09-30 LAB — SARS CORONAVIRUS 2 (TAT 6-24 HRS): SARS Coronavirus 2: NEGATIVE

## 2020-09-30 MED ORDER — AMOXICILLIN-POT CLAVULANATE 875-125 MG PO TABS: 1.0000 | ORAL_TABLET | Freq: Two times a day (BID) | ORAL | 0 refills | Status: DC

## 2020-09-30 NOTE — ED Provider Notes (Signed)
[facility]    CSN: [id number] Arrival date & time: 09/30/20  0808      History   Chief Complaint Chief Complaint  Patient presents with   Sore Throat   Ear Pain    HPI Teresa Castaneda is a 50 y.o. female.   Patient presenting today with 2-day history of sore, scratchy throat, thick green nasal congestion, facial pain and pressure, sharp constant left ear pain, muffled hearing, headache, malaise/fatigue.  Denies fever, chills, body aches, chest pain, shortness of breath, cough.  Has a history of asthma, seasonal allergies with recurrent sinusitis and ear infections, history of pneumonia.  Currently taking Advil Cold and Sinus with minimal relief.  No known sick contacts recently.   Past Medical History:  Diagnosis Date   Allergies    Anxiety    Arthritis    Asthma    Back pain    Spinal Issue    Diabetes mellitus without complication (HCC)    DJD (degenerative joint disease), lumbar    Elevated cholesterol    Gallstone    GERD (gastroesophageal reflux disease)    Neuromuscular disorder ([place])    Obesity    Oxygen deficiency    Thyroid disease     Patient Active Problem List   Diagnosis Date Noted   Cirrhosis of liver ([place]) 06/23/2020   Symptomatic cholelithiasis 05/10/2020   Acquired hypothyroidism 04/05/2020   Weight gain 04/05/2020   Type 2 diabetes mellitus with hyperglycemia, without long-term current use of insulin ([place]) 04/05/2020   History of COVID-19 09/23/2019   Pneumonia due to COVID-19 virus 09/23/2019   Cough 09/23/2019   BMI 40.0-44.9, adult ([place]) 07/22/2019   Facet arthropathy, lumbar 07/22/2019   Sacroiliitis ([place]) 07/22/2019   ANXIETY 09/10/2006   DEPRESSION 09/10/2006   ASTHMA 09/10/2006   HEADACHE 09/10/2006    Past Surgical History:  Procedure Laterality Date   ABDOMINAL HYSTERECTOMY  02/21/2003   CHOLECYSTECTOMY N/A 05/10/2020   Procedure: LAPAROSCOPIC CHOLECYSTECTOMY WITH INTRAOPERATIVE CHOLANGIOGRAM AND LIVER BIOPSY;   Surgeon: Felicie Morn, MD;  Location: [facility];  Service: General;  Laterality: N/A;   LIVER BIOPSY      OB History   No obstetric history on file.      Home Medications    Prior to Admission medications   Medication Sig Start Date End Date Taking? Authorizing Provider  amoxicillin-clavulanate (AUGMENTIN) 875-125 MG tablet Take 1 tablet by mouth every 12 (twelve) hours. 09/30/20  Yes Volney American, PA-C  albuterol (VENTOLIN HFA) 108 (90 Base) MCG/ACT inhaler Inhale 2 puffs into the lungs every 6 (six) hours as needed. For shortness of breath and wheezing 06/09/20   Wendie Agreste, MD  ARMOUR THYROID 180 MG tablet Take 180 mg by mouth daily. 09/07/19   [provider]  atorvastatin (LIPITOR) 10 MG tablet Take 1 tablet (10 mg total) by mouth daily. 06/09/20   Wendie Agreste, MD  Azelastine-Fluticasone [facility]) 137-50 MCG/ACT SUSP Place 1 spray into the nose in the morning and at bedtime. 03/10/20   Just, Laurita Quint, FNP  cholestyramine (QUESTRAN) 4 g packet Take 1 packet (4 g total) by mouth daily. 2 hours before or after all other medications 08/25/20   Jackquline Denmark, MD  dapagliflozin propanediol (FARXIGA) 5 MG TABS tablet Take 1 tablet (5 mg total) by mouth daily before breakfast. 07/09/20   Shamleffer, Melanie Crazier, MD  diphenoxylate-atropine (LOMOTIL) 2.5-0.025 MG tablet Take 1 tablet by mouth daily as needed for diarrhea or loose stools.  08/25/20   Jackquline Denmark, MD  escitalopram (LEXAPRO) 20 MG tablet Take 1 tablet (20 mg total) by mouth daily. 07/09/20   Wendie Agreste, MD  Fluticasone-Umeclidin-Vilant (TRELEGY ELLIPTA) 100-62.5-25 MCG/INH AEPB Inhale 1 puff into the lungs daily. 06/09/20   Wendie Agreste, MD  gabapentin (NEURONTIN) 100 MG capsule Take 1 capsule (100 mg total) by mouth 3 (three) times daily. 1 po Qam, 2 po Qpm. 08/12/20   Wendie Agreste, MD  ibuprofen (ADVIL) 600 MG tablet Take 600 mg by mouth as needed.    [provider]   lactulose (CHRONULAC) 10 GM/15ML solution Take 30 mLs (20 g total) by mouth daily. Please titrate to 1-2 softer bowel movements a day 06/28/20   Jackquline Denmark, MD  levocetirizine (XYZAL) 5 MG tablet Take 5 mg by mouth every evening.    [provider]  lidocaine (LIDODERM) 5 % USE 1 PATCH EXTERNALLY ONCE DAILY REMOVE  AND  DISCARD  PATCH  WITH  12  HOURS  OR  AS  DIRECTED  BY  MD 08/11/20   Wendie Agreste, MD  LORazepam (ATIVAN) 1 MG tablet Take 1 mg by mouth every 8 (eight) hours as needed for anxiety.    [provider]  metFORMIN (GLUCOPHAGE) 850 MG tablet Take 1 tablet (850 mg total) by mouth 2 (two) times daily with a meal. 06/09/20   Wendie Agreste, MD  omeprazole (PRILOSEC) 40 MG capsule Take 1 capsule (40 mg total) by mouth daily. 06/09/20   Wendie Agreste, MD  ondansetron (ZOFRAN) 4 MG tablet Take 1 tablet (4 mg total) by mouth every 6 (six) hours. 05/10/20   Maczis, Barth Kirks, PA-C  pantoprazole (PROTONIX) 40 MG tablet Take 1 tablet (40 mg total) by mouth daily. 08/25/20   Jackquline Denmark, MD  rifaximin (XIFAXAN) 550 MG TABS tablet Take 1 tablet (550 mg total) by mouth 2 (two) times daily. 06/04/20   Jackquline Denmark, MD  tamsulosin (FLOMAX) 0.4 MG CAPS capsule Take 1 capsule (0.4 mg total) by mouth daily. 06/22/20   Maximiano Coss, NP  azelastine (ASTELIN) 0.1 % nasal spray Place 2 sprays into both nostrils 2 (two) times daily. 04/23/19 12/26/19  Ok Edwards, PA-C    Family History Family History  Problem Relation Age of Onset   Coronary artery disease Mother    Diabetes Mother    COPD Mother    Coronary artery disease Father    Hypertension Father    Colon cancer Neg Hx    Esophageal cancer Neg Hx     Social History Social History   Tobacco Use   Smoking status: Former    Packs/day: 0.50    Types: Cigarettes    Start date: 01/16/1985    Quit date: 10/08/2019    Years since quitting: 0.9   Smokeless tobacco: Never  Vaping Use   Vaping Use: Never used   Substance Use Topics   Alcohol use: No   Drug use: No     Allergies   Clarithromycin and Codeine   Review of Systems Review of Systems Per HPI  Physical Exam Triage Vital Signs ED Triage Vitals  Enc Vitals Group     BP 09/30/20 0831 126/85     Pulse Rate 09/30/20 0831 95     Resp 09/30/20 0831 19     Temp 09/30/20 0831 97.9 F (36.6 C)     Temp Source 09/30/20 0831 Oral     SpO2 09/30/20 0831 99 %  Weight --      Height --      Head Circumference --      Peak Flow --      Pain Score 09/30/20 0829 6     Pain Loc --      Pain Edu? --      Excl. in [place]? --    No data found.  Updated Vital Signs BP 126/85   Pulse 95   Temp 97.9 F (36.6 C) (Oral)   Resp 19   SpO2 99%   Visual Acuity Right Eye Distance:   Left Eye Distance:   Bilateral Distance:    Right Eye Near:   Left Eye Near:    Bilateral Near:     Physical Exam Vitals and nursing note reviewed.  Constitutional:      Appearance: Normal appearance. She is not ill-appearing.  HENT:     Head: Atraumatic.     Right Ear: Tympanic membrane and external ear normal.     Ears:     Comments: Left TM significantly erythematous, injected, edematous    Nose: Rhinorrhea present.     Mouth/Throat:     Mouth: Mucous membranes are moist.     Pharynx: Posterior oropharyngeal erythema present.  Eyes:     Extraocular Movements: Extraocular movements intact.     Conjunctiva/sclera: Conjunctivae normal.  Cardiovascular:     Rate and Rhythm: Normal rate and regular rhythm.     Heart sounds: Normal heart sounds.  Pulmonary:     Effort: Pulmonary effort is normal.     Breath sounds: Normal breath sounds. No wheezing or rales.  Musculoskeletal:        General: Normal range of motion.     Cervical back: Normal range of motion and neck supple.  Skin:    General: Skin is warm and dry.  Neurological:     Mental Status: She is alert and oriented to person, place, and time.  Psychiatric:        Mood and Affect:  Mood normal.        Thought Content: Thought content normal.        Judgment: Judgment normal.     UC Treatments / Results  Labs (all labs ordered are listed, but only abnormal results are displayed) Labs Reviewed  SARS CORONAVIRUS 2 (TAT 6-24 HRS)    EKG   Radiology No results found.  Procedures Procedures (including critical care time)  Medications Ordered in UC Medications - No data to display  Initial Impression / Assessment and Plan / UC Course  I have reviewed the triage vital signs and the nursing notes.  Pertinent labs & imaging results that were available during my care of the patient were reviewed by me and considered in my medical decision making (see chart for details).     Vital signs reassuring today, patient appearing in no acute distress.  We will treat with Augmentin for otitis media, COVID PCR pending.  Suspect viral illness underlying that progressed into otitis.  Continue over-the-counter cold and cough medications, supportive home care.  Strict return precautions given for acutely worsening symptoms.  Work note given with Journalist, newspaper.  Final Clinical Impressions(s) / UC Diagnoses   Final diagnoses:  Viral URI with cough  Recurrent acute suppurative otitis media without spontaneous rupture of left tympanic membrane   Discharge Instructions   None    ED Prescriptions     Medication Sig Dispense Auth. Provider   amoxicillin-clavulanate (AUGMENTIN) 875-125 MG tablet Take  1 tablet by mouth every 12 (twelve) hours. 14 tablet Volney American, [place]      PDMP not reviewed this encounter.   Merrie Roof [place], [place] 09/30/20 [phone]

## 2020-09-30 NOTE — ED Triage Notes (Signed)
Pt presents with cough, nasal drainage, sore throat and ear pain X 2 days.   Pt states she woke up feeling worse this morning.

## 2020-10-06 ENCOUNTER — Other Ambulatory Visit: Payer: Self-pay

## 2020-10-06 ENCOUNTER — Encounter (HOSPITAL_BASED_OUTPATIENT_CLINIC_OR_DEPARTMENT_OTHER): Payer: Self-pay | Admitting: Otolaryngology

## 2020-10-08 ENCOUNTER — Encounter (HOSPITAL_BASED_OUTPATIENT_CLINIC_OR_DEPARTMENT_OTHER)
Admission: RE | Admit: 2020-10-08 | Discharge: 2020-10-08 | Disposition: A | Discharge status: Home / Self Care | Payer: Commercial Managed Care - PPO | Source: Ambulatory Visit | Attending: Otolaryngology | Admitting: Otolaryngology

## 2020-10-08 DIAGNOSIS — Z01812 Encounter for preprocedural laboratory examination: Secondary | ICD-10-CM | POA: Diagnosis present

## 2020-10-08 LAB — BASIC METABOLIC PANEL
Anion gap: 8 (ref 5–15)
BUN: 5 mg/dL — ABNORMAL LOW (ref 6–20)
CO2: 22 mmol/L (ref 22–32)
Calcium: 8.4 mg/dL — ABNORMAL LOW (ref 8.9–10.3)
Chloride: 103 mmol/L (ref 98–111)
Creatinine, Ser: 0.64 mg/dL (ref 0.44–1.00)
GFR, Estimated: >60 mL/min (ref >60)
Glucose, Bld: 254 mg/dL — ABNORMAL HIGH (ref 70–99)
Potassium: 4.3 mmol/L (ref 3.5–5.1)
Sodium: 133 mmol/L — ABNORMAL LOW (ref 135–145)

## 2020-10-08 LAB — CBC
HCT: 42.6 % (ref 36.0–46.0)
Hemoglobin: 13.7 g/dL (ref 12.0–15.0)
MCH: 26.4 pg (ref 26.0–34.0)
MCHC: 32.2 g/dL (ref 30.0–36.0)
MCV: 82.1 fL (ref 80.0–100.0)
Platelets: 118 10*3/uL — ABNORMAL LOW (ref 150–400)
RBC: 5.19 MIL/uL — ABNORMAL HIGH (ref 3.87–5.11)
RDW: 15.1 % (ref 11.5–15.5)
WBC: 12.5 10*3/uL — ABNORMAL HIGH (ref 4.0–10.5)
nRBC: 0 % (ref 0.0–0.2)

## 2020-10-11 NOTE — Progress Notes (Signed)
Recent labs reviewed by Dr. Elgie Congo, ok to proceed as planned with surgery at [facility].  Also notified Dr. Pollie Friar office of elevated WBC.

## 2020-10-12 ENCOUNTER — Ambulatory Visit (INDEPENDENT_AMBULATORY_CARE_PROVIDER_SITE_OTHER): Payer: Self-pay | Admitting: Otolaryngology

## 2020-10-12 DIAGNOSIS — J322 Chronic ethmoidal sinusitis: Secondary | ICD-10-CM

## 2020-10-12 NOTE — H&P (View-Only) (Signed)
PREOPERATIVE H&P  Chief Complaint: Chronic sinus infections  HPI: Teresa Castaneda is a 50 y.o. female who presents for evaluation of chronic sinus infections that she has had for years.  She has been on repeated rounds of antibiotics.  Most recent CT scan performed demonstrated opacification of ethmoid sinuses bilaterally as well as the maxillary sinuses worse on the right side.  She had minimal septal deformity.  Sphenoid sinuses were clear and the frontal sinuses were relatively clear.  She has alternating nasal obstruction from side to side and is taken to the operating room at this time for functional endoscopic sinus surgery and bilateral inferior turbinate reductions.  Past Medical History:  Diagnosis Date   Allergies    Anxiety    Arthritis    Asthma    Back pain    Spinal Issue    Diabetes mellitus without complication (HCC)    DJD (degenerative joint disease), lumbar    Elevated cholesterol    Gallstone    GERD (gastroesophageal reflux disease)    Liver cirrhosis secondary to NASH (HCC)    Neuromuscular disorder (HCC)    Obesity    Oxygen deficiency    Thyroid disease    Past Surgical History:  Procedure Laterality Date   ABDOMINAL HYSTERECTOMY  02/21/2003   CHOLECYSTECTOMY N/A 05/10/2020   Procedure: LAPAROSCOPIC CHOLECYSTECTOMY WITH INTRAOPERATIVE CHOLANGIOGRAM AND LIVER BIOPSY;  Surgeon: Felicie Morn, MD;  Location: [facility];  Service: General;  Laterality: N/A;   LIVER BIOPSY     TONSILLECTOMY     Social History   Socioeconomic History   Marital status: Single    Spouse name: Not on file   Number of children: Not on file   Years of education: Not on file   Highest education level: Not on file  Occupational History   Not on file  Tobacco Use   Smoking status: Former    Packs/day: 0.50    Types: Cigarettes    Start date: 01/16/1985    Quit date: 10/08/2019    Years since quitting: 1.0   Smokeless tobacco: Never  Vaping Use   Vaping Use: Never used   Substance and Sexual Activity   Alcohol use: No   Drug use: No   Sexual activity: Not on file  Other Topics Concern   Not on file  Social History Narrative   Not on file   Social Determinants of Health   Financial Resource Strain: Not on file  Food Insecurity: Not on file  Transportation Needs: Not on file  Physical Activity: Not on file  Stress: Not on file  Social Connections: Not on file   Family History  Problem Relation Age of Onset   Coronary artery disease Mother    Diabetes Mother    COPD Mother    Coronary artery disease Father    Hypertension Father    Colon cancer Neg Hx    Esophageal cancer Neg Hx    Allergies  Allergen Reactions   Clarithromycin Other (See Comments)    Body cramps    Codeine Nausea And Vomiting   Prior to Admission medications   Medication Sig Start Date End Date Taking? Authorizing Provider  albuterol (VENTOLIN HFA) 108 (90 Base) MCG/ACT inhaler Inhale 2 puffs into the lungs every 6 (six) hours as needed. For shortness of breath and wheezing 06/09/20   Wendie Agreste, MD  amoxicillin-clavulanate (AUGMENTIN) 875-125 MG tablet Take 1 tablet by mouth every 12 (twelve) hours. 09/30/20   Merrie Roof  Benjamine Mola, PA-C  ARMOUR THYROID 180 MG tablet Take 180 mg by mouth daily. 09/07/19   [provider]  atorvastatin (LIPITOR) 10 MG tablet Take 1 tablet (10 mg total) by mouth daily. 06/09/20   Wendie Agreste, MD  Azelastine-Fluticasone [facility]) 137-50 MCG/ACT SUSP Place 1 spray into the nose in the morning and at bedtime. 03/10/20   Just, Laurita Quint, FNP  cholestyramine (QUESTRAN) 4 g packet Take 1 packet (4 g total) by mouth daily. 2 hours before or after all other medications 08/25/20   Jackquline Denmark, MD  dapagliflozin propanediol (FARXIGA) 5 MG TABS tablet Take 1 tablet (5 mg total) by mouth daily before breakfast. 07/09/20   Shamleffer, Melanie Crazier, MD  diphenoxylate-atropine (LOMOTIL) 2.5-0.025 MG tablet Take 1 tablet by mouth daily  as needed for diarrhea or loose stools. 08/25/20   Jackquline Denmark, MD  escitalopram (LEXAPRO) 20 MG tablet Take 1 tablet (20 mg total) by mouth daily. 07/09/20   Wendie Agreste, MD  Fluticasone-Umeclidin-Vilant (TRELEGY ELLIPTA) 100-62.5-25 MCG/INH AEPB Inhale 1 puff into the lungs daily. 06/09/20   Wendie Agreste, MD  gabapentin (NEURONTIN) 100 MG capsule Take 1 capsule (100 mg total) by mouth 3 (three) times daily. 1 po Qam, 2 po Qpm. Patient taking differently: Take 300 mg by mouth 3 (three) times daily. 1 po Qam, 2 po Qpm. 08/12/20   Wendie Agreste, MD  ibuprofen (ADVIL) 600 MG tablet Take 600 mg by mouth as needed.    [provider]  levocetirizine (XYZAL) 5 MG tablet Take 5 mg by mouth every evening.    [provider]  lidocaine (LIDODERM) 5 % USE 1 PATCH EXTERNALLY ONCE DAILY REMOVE  AND  DISCARD  PATCH  WITH  12  HOURS  OR  AS  DIRECTED  BY  MD 08/11/20   Wendie Agreste, MD  LORazepam (ATIVAN) 1 MG tablet Take 1 mg by mouth every 8 (eight) hours as needed for anxiety.    [provider]  metFORMIN (GLUCOPHAGE) 850 MG tablet Take 1 tablet (850 mg total) by mouth 2 (two) times daily with a meal. 06/09/20   Wendie Agreste, MD  ondansetron (ZOFRAN) 4 MG tablet Take 1 tablet (4 mg total) by mouth every 6 (six) hours. 05/10/20   Maczis, Barth Kirks, PA-C  pantoprazole (PROTONIX) 40 MG tablet Take 1 tablet (40 mg total) by mouth daily. 08/25/20   Jackquline Denmark, MD  rifaximin (XIFAXAN) 550 MG TABS tablet Take 1 tablet (550 mg total) by mouth 2 (two) times daily. 06/04/20   Jackquline Denmark, MD  azelastine (ASTELIN) 0.1 % nasal spray Place 2 sprays into both nostrils 2 (two) times daily. 04/23/19 12/26/19  [name], Amy V, PA-C     Positive ROS: Otherwise negative  All other systems have been reviewed and were otherwise negative with the exception of those mentioned in the HPI and as above.  Physical Exam: There were no vitals filed for this visit.  General: Alert, no  acute distress Oral: Normal oral mucosa and tonsils Nasal: Moderate mucosal edema within the nasal cavity with large inferior turbinates.  Moderate mucosal edema of the middle meatus area but no polyps noted. Neck: No palpable adenopathy or thyroid nodules Ear: Ear canal is clear with normal appearing TMs Cardiovascular: Regular rate and rhythm, no murmur.  Respiratory: Clear to auscultation Neurologic: Alert and oriented x 3   Assessment/Plan: Chronic sinus opacification with turbinate hypertrophy and nasal obstruction.  Plan for functional endoscopic sinus surgery  with bilateral total ethmoidectomies and maxillary ostia enlargement along with bilateral inferior turbinate reductions.   Melony Overly, MD 10/12/2020 9:53 AM

## 2020-10-12 NOTE — H&P (Signed)
PREOPERATIVE H&P  Chief Complaint: Chronic sinus infections  HPI: Teresa Castaneda is a 50 y.o. female who presents for evaluation of chronic sinus infections that she has had for years.  She has been on repeated rounds of antibiotics.  Most recent CT scan performed demonstrated opacification of ethmoid sinuses bilaterally as well as the maxillary sinuses worse on the right side.  She had minimal septal deformity.  Sphenoid sinuses were clear and the frontal sinuses were relatively clear.  She has alternating nasal obstruction from side to side and is taken to the operating room at this time for functional endoscopic sinus surgery and bilateral inferior turbinate reductions.  Past Medical History:  Diagnosis Date   Allergies    Anxiety    Arthritis    Asthma    Back pain    Spinal Issue    Diabetes mellitus without complication (HCC)    DJD (degenerative joint disease), lumbar    Elevated cholesterol    Gallstone    GERD (gastroesophageal reflux disease)    Liver cirrhosis secondary to NASH (HCC)    Neuromuscular disorder (HCC)    Obesity    Oxygen deficiency    Thyroid disease    Past Surgical History:  Procedure Laterality Date   ABDOMINAL HYSTERECTOMY  02/21/2003   CHOLECYSTECTOMY N/A 05/10/2020   Procedure: LAPAROSCOPIC CHOLECYSTECTOMY WITH INTRAOPERATIVE CHOLANGIOGRAM AND LIVER BIOPSY;  Surgeon: Felicie Morn, MD;  Location: [facility];  Service: General;  Laterality: N/A;   LIVER BIOPSY     TONSILLECTOMY     Social History   Socioeconomic History   Marital status: Single    Spouse name: Not on file   Number of children: Not on file   Years of education: Not on file   Highest education level: Not on file  Occupational History   Not on file  Tobacco Use   Smoking status: Former    Packs/day: 0.50    Types: Cigarettes    Start date: 01/16/1985    Quit date: 10/08/2019    Years since quitting: 1.0   Smokeless tobacco: Never  Vaping Use   Vaping Use: Never used   Substance and Sexual Activity   Alcohol use: No   Drug use: No   Sexual activity: Not on file  Other Topics Concern   Not on file  Social History Narrative   Not on file   Social Determinants of Health   Financial Resource Strain: Not on file  Food Insecurity: Not on file  Transportation Needs: Not on file  Physical Activity: Not on file  Stress: Not on file  Social Connections: Not on file   Family History  Problem Relation Age of Onset   Coronary artery disease Mother    Diabetes Mother    COPD Mother    Coronary artery disease Father    Hypertension Father    Colon cancer Neg Hx    Esophageal cancer Neg Hx    Allergies  Allergen Reactions   Clarithromycin Other (See Comments)    Body cramps    Codeine Nausea And Vomiting   Prior to Admission medications   Medication Sig Start Date End Date Taking? Authorizing Provider  albuterol (VENTOLIN HFA) 108 (90 Base) MCG/ACT inhaler Inhale 2 puffs into the lungs every 6 (six) hours as needed. For shortness of breath and wheezing 06/09/20   Wendie Agreste, MD  amoxicillin-clavulanate (AUGMENTIN) 875-125 MG tablet Take 1 tablet by mouth every 12 (twelve) hours. 09/30/20   Merrie Roof  Benjamine Mola, PA-C  ARMOUR THYROID 180 MG tablet Take 180 mg by mouth daily. 09/07/19   [provider]  atorvastatin (LIPITOR) 10 MG tablet Take 1 tablet (10 mg total) by mouth daily. 06/09/20   Wendie Agreste, MD  Azelastine-Fluticasone [facility]) 137-50 MCG/ACT SUSP Place 1 spray into the nose in the morning and at bedtime. 03/10/20   Just, Laurita Quint, FNP  cholestyramine (QUESTRAN) 4 g packet Take 1 packet (4 g total) by mouth daily. 2 hours before or after all other medications 08/25/20   Jackquline Denmark, MD  dapagliflozin propanediol (FARXIGA) 5 MG TABS tablet Take 1 tablet (5 mg total) by mouth daily before breakfast. 07/09/20   Shamleffer, Melanie Crazier, MD  diphenoxylate-atropine (LOMOTIL) 2.5-0.025 MG tablet Take 1 tablet by mouth daily  as needed for diarrhea or loose stools. 08/25/20   Jackquline Denmark, MD  escitalopram (LEXAPRO) 20 MG tablet Take 1 tablet (20 mg total) by mouth daily. 07/09/20   Wendie Agreste, MD  Fluticasone-Umeclidin-Vilant (TRELEGY ELLIPTA) 100-62.5-25 MCG/INH AEPB Inhale 1 puff into the lungs daily. 06/09/20   Wendie Agreste, MD  gabapentin (NEURONTIN) 100 MG capsule Take 1 capsule (100 mg total) by mouth 3 (three) times daily. 1 po Qam, 2 po Qpm. Patient taking differently: Take 300 mg by mouth 3 (three) times daily. 1 po Qam, 2 po Qpm. 08/12/20   Wendie Agreste, MD  ibuprofen (ADVIL) 600 MG tablet Take 600 mg by mouth as needed.    [provider]  levocetirizine (XYZAL) 5 MG tablet Take 5 mg by mouth every evening.    [provider]  lidocaine (LIDODERM) 5 % USE 1 PATCH EXTERNALLY ONCE DAILY REMOVE  AND  DISCARD  PATCH  WITH  12  HOURS  OR  AS  DIRECTED  BY  MD 08/11/20   Wendie Agreste, MD  LORazepam (ATIVAN) 1 MG tablet Take 1 mg by mouth every 8 (eight) hours as needed for anxiety.    [provider]  metFORMIN (GLUCOPHAGE) 850 MG tablet Take 1 tablet (850 mg total) by mouth 2 (two) times daily with a meal. 06/09/20   Wendie Agreste, MD  ondansetron (ZOFRAN) 4 MG tablet Take 1 tablet (4 mg total) by mouth every 6 (six) hours. 05/10/20   Maczis, Barth Kirks, PA-C  pantoprazole (PROTONIX) 40 MG tablet Take 1 tablet (40 mg total) by mouth daily. 08/25/20   Jackquline Denmark, MD  rifaximin (XIFAXAN) 550 MG TABS tablet Take 1 tablet (550 mg total) by mouth 2 (two) times daily. 06/04/20   Jackquline Denmark, MD  azelastine (ASTELIN) 0.1 % nasal spray Place 2 sprays into both nostrils 2 (two) times daily. 04/23/19 12/26/19  [name], Amy V, PA-C     Positive ROS: Otherwise negative  All other systems have been reviewed and were otherwise negative with the exception of those mentioned in the HPI and as above.  Physical Exam: There were no vitals filed for this visit.  General: Alert, no  acute distress Oral: Normal oral mucosa and tonsils Nasal: Moderate mucosal edema within the nasal cavity with large inferior turbinates.  Moderate mucosal edema of the middle meatus area but no polyps noted. Neck: No palpable adenopathy or thyroid nodules Ear: Ear canal is clear with normal appearing TMs Cardiovascular: Regular rate and rhythm, no murmur.  Respiratory: Clear to auscultation Neurologic: Alert and oriented x 3   Assessment/Plan: Chronic sinus opacification with turbinate hypertrophy and nasal obstruction.  Plan for functional endoscopic sinus surgery  with bilateral total ethmoidectomies and maxillary ostia enlargement along with bilateral inferior turbinate reductions.   Melony Overly, MD 10/12/2020 9:53 AM

## 2020-10-13 ENCOUNTER — Other Ambulatory Visit: Payer: Self-pay

## 2020-10-13 ENCOUNTER — Ambulatory Visit (HOSPITAL_BASED_OUTPATIENT_CLINIC_OR_DEPARTMENT_OTHER): Payer: Commercial Managed Care - PPO | Admitting: Anesthesiology

## 2020-10-13 ENCOUNTER — Encounter (HOSPITAL_BASED_OUTPATIENT_CLINIC_OR_DEPARTMENT_OTHER)
Admission: RE | Disposition: A | Discharge status: Home / Self Care | Payer: Self-pay | Source: Ambulatory Visit | Attending: Otolaryngology

## 2020-10-13 ENCOUNTER — Encounter (HOSPITAL_BASED_OUTPATIENT_CLINIC_OR_DEPARTMENT_OTHER): Payer: Self-pay | Admitting: Otolaryngology

## 2020-10-13 ENCOUNTER — Ambulatory Visit (HOSPITAL_BASED_OUTPATIENT_CLINIC_OR_DEPARTMENT_OTHER)
Admission: RE | Admit: 2020-10-13 | Discharge: 2020-10-13 | Disposition: A | Discharge status: Home / Self Care | Payer: Commercial Managed Care - PPO | Source: Ambulatory Visit | Attending: Otolaryngology | Admitting: Otolaryngology

## 2020-10-13 DIAGNOSIS — Z833 Family history of diabetes mellitus: Secondary | ICD-10-CM | POA: Insufficient documentation

## 2020-10-13 DIAGNOSIS — Z87891 Personal history of nicotine dependence: Secondary | ICD-10-CM | POA: Insufficient documentation

## 2020-10-13 DIAGNOSIS — Z791 Long term (current) use of non-steroidal anti-inflammatories (NSAID): Secondary | ICD-10-CM | POA: Diagnosis not present

## 2020-10-13 DIAGNOSIS — Z8249 Family history of ischemic heart disease and other diseases of the circulatory system: Secondary | ICD-10-CM | POA: Diagnosis not present

## 2020-10-13 DIAGNOSIS — J320 Chronic maxillary sinusitis: Secondary | ICD-10-CM | POA: Diagnosis not present

## 2020-10-13 DIAGNOSIS — J329 Chronic sinusitis, unspecified: Secondary | ICD-10-CM | POA: Insufficient documentation

## 2020-10-13 DIAGNOSIS — J342 Deviated nasal septum: Secondary | ICD-10-CM | POA: Diagnosis not present

## 2020-10-13 DIAGNOSIS — J3489 Other specified disorders of nose and nasal sinuses: Secondary | ICD-10-CM | POA: Insufficient documentation

## 2020-10-13 DIAGNOSIS — E119 Type 2 diabetes mellitus without complications: Secondary | ICD-10-CM | POA: Insufficient documentation

## 2020-10-13 DIAGNOSIS — Z885 Allergy status to narcotic agent status: Secondary | ICD-10-CM | POA: Diagnosis not present

## 2020-10-13 DIAGNOSIS — J322 Chronic ethmoidal sinusitis: Secondary | ICD-10-CM

## 2020-10-13 DIAGNOSIS — Z7984 Long term (current) use of oral hypoglycemic drugs: Secondary | ICD-10-CM | POA: Diagnosis not present

## 2020-10-13 DIAGNOSIS — Z825 Family history of asthma and other chronic lower respiratory diseases: Secondary | ICD-10-CM | POA: Diagnosis not present

## 2020-10-13 DIAGNOSIS — J343 Hypertrophy of nasal turbinates: Secondary | ICD-10-CM | POA: Diagnosis not present

## 2020-10-13 HISTORY — PX: NASAL SINUS SURGERY: SHX719

## 2020-10-13 HISTORY — PX: ETHMOIDECTOMY: SHX5197

## 2020-10-13 HISTORY — PX: ENDOSCOPIC TURBINATE REDUCTION: SHX6489

## 2020-10-13 HISTORY — DX: Nonalcoholic steatohepatitis (NASH): K75.81

## 2020-10-13 HISTORY — DX: Unspecified cirrhosis of liver: K74.60

## 2020-10-13 LAB — GLUCOSE, CAPILLARY
Glucose-Capillary: 203 mg/dL — ABNORMAL HIGH (ref 70–99)
Glucose-Capillary: 263 mg/dL — ABNORMAL HIGH (ref 70–99)

## 2020-10-13 SURGERY — SINUS SURGERY, ENDOSCOPIC, USING FRAMELESS STEREOTAXY
Anesthesia: General | Site: Nose

## 2020-10-13 MED ORDER — SODIUM CHLORIDE 0.9 % IV SOLN
INTRAVENOUS | Status: AC | PRN
  Administered 2020-10-13: 250 mL

## 2020-10-13 MED ORDER — FENTANYL CITRATE (PF) 100 MCG/2ML IJ SOLN
INTRAMUSCULAR | Status: AC
  Filled 2020-10-13: qty 2

## 2020-10-13 MED ORDER — PROPOFOL 10 MG/ML IV BOLUS
INTRAVENOUS | Status: DC | PRN
Start: 2020-10-13 — End: 2020-10-13
  Administered 2020-10-13: 200 mg via INTRAVENOUS

## 2020-10-13 MED ORDER — SUGAMMADEX SODIUM 200 MG/2ML IV SOLN
INTRAVENOUS | Status: DC | PRN
  Administered 2020-10-13: 200 mg via INTRAVENOUS

## 2020-10-13 MED ORDER — FENTANYL CITRATE (PF) 100 MCG/2ML IJ SOLN
INTRAMUSCULAR | Status: DC | PRN
  Administered 2020-10-13: 100 ug via INTRAVENOUS
  Administered 2020-10-13: 50 ug via INTRAVENOUS

## 2020-10-13 MED ORDER — CHLORHEXIDINE GLUCONATE CLOTH 2 % EX PADS: 6.0000 | MEDICATED_PAD | Freq: Once | CUTANEOUS | Status: DC

## 2020-10-13 MED ORDER — MUPIROCIN 2 % EX OINT
TOPICAL_OINTMENT | CUTANEOUS | Status: DC | PRN
  Administered 2020-10-13: 1 via TOPICAL

## 2020-10-13 MED ORDER — MIDAZOLAM HCL 2 MG/2ML IJ SOLN
INTRAMUSCULAR | Status: AC
  Filled 2020-10-13: qty 2

## 2020-10-13 MED ORDER — AMISULPRIDE (ANTIEMETIC) 5 MG/2ML IV SOLN: 10.0000 mg | Freq: Once | INTRAVENOUS | Status: DC | PRN

## 2020-10-13 MED ORDER — ONDANSETRON HCL 4 MG/2ML IJ SOLN
INTRAMUSCULAR | Status: DC | PRN
  Administered 2020-10-13: 4 mg via INTRAVENOUS

## 2020-10-13 MED ORDER — SILVER NITRATE-POT NITRATE 75-25 % EX MISC
CUTANEOUS | Status: AC
  Filled 2020-10-13: qty 10

## 2020-10-13 MED ORDER — EPINEPHRINE PF 1 MG/ML IJ SOLN
INTRAMUSCULAR | Status: AC
  Filled 2020-10-13: qty 2

## 2020-10-13 MED ORDER — CEFAZOLIN SODIUM-DEXTROSE 2-4 GM/100ML-% IV SOLN
INTRAVENOUS | Status: AC
  Filled 2020-10-13: qty 100

## 2020-10-13 MED ORDER — OXYMETAZOLINE HCL 0.05 % NA SOLN
NASAL | Status: DC | PRN
  Administered 2020-10-13: 1 via TOPICAL

## 2020-10-13 MED ORDER — FENTANYL CITRATE (PF) 100 MCG/2ML IJ SOLN: 25.0000 ug | INTRAMUSCULAR | Status: DC | PRN

## 2020-10-13 MED ORDER — ROCURONIUM BROMIDE 100 MG/10ML IV SOLN
INTRAVENOUS | Status: DC | PRN
  Administered 2020-10-13: 20 mg via INTRAVENOUS
  Administered 2020-10-13: 60 mg via INTRAVENOUS

## 2020-10-13 MED ORDER — SILVER NITRATE-POT NITRATE 75-25 % EX MISC
CUTANEOUS | Status: DC | PRN
  Administered 2020-10-13: 1 via TOPICAL

## 2020-10-13 MED ORDER — IBUPROFEN 200 MG PO TABS
ORAL_TABLET | ORAL | Status: AC
  Filled 2020-10-13: qty 1

## 2020-10-13 MED ORDER — IPRATROPIUM-ALBUTEROL 0.5-2.5 (3) MG/3ML IN SOLN
3.0000 mL | RESPIRATORY_TRACT | Status: DC
  Administered 2020-10-13: 3 mL via RESPIRATORY_TRACT

## 2020-10-13 MED ORDER — MIDAZOLAM HCL 5 MG/5ML IJ SOLN
INTRAMUSCULAR | Status: DC | PRN
  Administered 2020-10-13: 2 mg via INTRAVENOUS

## 2020-10-13 MED ORDER — EPINEPHRINE PF 1 MG/ML IJ SOLN
INTRAMUSCULAR | Status: DC | PRN
  Administered 2020-10-13: 1 mg

## 2020-10-13 MED ORDER — ACETAMINOPHEN 500 MG PO TABS
ORAL_TABLET | ORAL | Status: AC
  Filled 2020-10-13: qty 2

## 2020-10-13 MED ORDER — LACTATED RINGERS IV SOLN: INTRAVENOUS | Status: DC

## 2020-10-13 MED ORDER — IPRATROPIUM-ALBUTEROL 0.5-2.5 (3) MG/3ML IN SOLN
RESPIRATORY_TRACT | Status: AC
  Filled 2020-10-13: qty 3

## 2020-10-13 MED ORDER — IBUPROFEN 600 MG PO TABS
600.0000 mg | ORAL_TABLET | Freq: Once | ORAL | Status: AC
  Administered 2020-10-13: 600 mg via ORAL

## 2020-10-13 MED ORDER — DEXAMETHASONE SODIUM PHOSPHATE 4 MG/ML IJ SOLN
INTRAMUSCULAR | Status: DC | PRN
  Administered 2020-10-13: 4 mg via INTRAVENOUS

## 2020-10-13 MED ORDER — ACETAMINOPHEN 500 MG PO TABS: 1000.0000 mg | ORAL_TABLET | Freq: Once | ORAL | Status: DC

## 2020-10-13 MED ORDER — LIDOCAINE HCL (CARDIAC) PF 100 MG/5ML IV SOSY
PREFILLED_SYRINGE | INTRAVENOUS | Status: DC | PRN
  Administered 2020-10-13: 60 mg via INTRAVENOUS

## 2020-10-13 MED ORDER — CEPHALEXIN 500 MG PO CAPS: 500.0000 mg | ORAL_CAPSULE | Freq: Two times a day (BID) | ORAL | 0 refills | Status: DC

## 2020-10-13 MED ORDER — CEFAZOLIN SODIUM-DEXTROSE 2-4 GM/100ML-% IV SOLN
2.0000 g | INTRAVENOUS | Status: AC
  Administered 2020-10-13: 2 g via INTRAVENOUS

## 2020-10-13 MED ORDER — LIDOCAINE-EPINEPHRINE 1 %-1:100000 IJ SOLN
INTRAMUSCULAR | Status: DC | PRN
  Administered 2020-10-13: 11 mL

## 2020-10-13 SURGICAL SUPPLY — 61 items
ATTRACTOMAT 16X20 MAGNETIC DRP (DRAPES) IMPLANT
BLADE INF TURB ROT M4 2 5PK (BLADE) ×4 IMPLANT
BLADE RAD40 ROTATE 4M 4 5PK (BLADE) IMPLANT
BLADE RAD60 ROTATE M4 4 5PK (BLADE) IMPLANT
BLADE ROTATE RAD 12 4 M4 (BLADE) IMPLANT
BLADE ROTATE RAD 40 4 M4 (BLADE) IMPLANT
BLADE ROTATE TRICUT 4X13 M4 (BLADE) ×4 IMPLANT
BLADE TRICUT ROTATE M4 4 5PK (BLADE) IMPLANT
BUR HS RAD FRONTAL 3 (BURR) IMPLANT
CANISTER SUC SOCK COL 7IN (MISCELLANEOUS) ×4 IMPLANT
CANISTER SUCT 1200ML W/VALVE (MISCELLANEOUS) ×4 IMPLANT
CLEANER CAUTERY TIP 5X5 PAD (MISCELLANEOUS) IMPLANT
COAGULATOR SUCT 8FR VV (MISCELLANEOUS) ×4 IMPLANT
DECANTER SPIKE VIAL GLASS SM (MISCELLANEOUS) IMPLANT
DRAPE SURG 17X23 STRL (DRAPES) ×4 IMPLANT
DRESSING NASAL KENNEDY 3.5X.9 (MISCELLANEOUS) IMPLANT
DRSG CURAD 3X16 NADH (PACKING) IMPLANT
DRSG NASAL KENNEDY 3.5X.9 (MISCELLANEOUS)
DRSG NASAL KENNEDY LMNT 8CM (GAUZE/BANDAGES/DRESSINGS) IMPLANT
DRSG NASOPORE 8CM (GAUZE/BANDAGES/DRESSINGS) ×4 IMPLANT
DRSG TELFA 3X8 NADH (GAUZE/BANDAGES/DRESSINGS) IMPLANT
ELECT COATED BLADE 2.86 ST (ELECTRODE) IMPLANT
ELECT NEEDLE BLADE 2-5/6 (NEEDLE) IMPLANT
ELECT REM PT RETURN 9FT ADLT (ELECTROSURGICAL) ×4
ELECTRODE REM PT RTRN 9FT ADLT (ELECTROSURGICAL) ×3 IMPLANT
GLOVE SURG MICRO LTX SZ7.5 (GLOVE) ×4 IMPLANT
GLOVE SURG POLYISO LF SZ8 (GLOVE) ×4 IMPLANT
GOWN STRL REUS W/ TWL LRG LVL3 (GOWN DISPOSABLE) IMPLANT
GOWN STRL REUS W/ TWL XL LVL3 (GOWN DISPOSABLE) ×3 IMPLANT
GOWN STRL REUS W/TWL 2XL LVL3 (GOWN DISPOSABLE) ×4 IMPLANT
GOWN STRL REUS W/TWL LRG LVL3 (GOWN DISPOSABLE)
GOWN STRL REUS W/TWL XL LVL3 (GOWN DISPOSABLE) ×4
HEMOSTAT ARISTA ABSORB 3G PWDR (HEMOSTASIS) ×4 IMPLANT
HEMOSTAT SURGICEL .5X2 ABSORB (HEMOSTASIS) IMPLANT
HEMOSTAT SURGICEL 2X14 (HEMOSTASIS) IMPLANT
IV NS 1000ML (IV SOLUTION)
IV NS 1000ML BAXH (IV SOLUTION) IMPLANT
IV NS 500ML (IV SOLUTION) ×4
IV NS 500ML BAXH (IV SOLUTION) ×3 IMPLANT
NEEDLE PRECISIONGLIDE 27X1.5 (NEEDLE) ×4 IMPLANT
NEEDLE SPNL 25GX3.5 QUINCKE BL (NEEDLE) IMPLANT
NS IRRIG 1000ML POUR BTL (IV SOLUTION) ×4 IMPLANT
PACK BASIN DAY SURGERY FS (CUSTOM PROCEDURE TRAY) ×4 IMPLANT
PACK ENT DAY SURGERY (CUSTOM PROCEDURE TRAY) ×4 IMPLANT
PAD CLEANER CAUTERY TIP 5X5 (MISCELLANEOUS)
PATTIES SURGICAL .5 X3 (DISPOSABLE) ×8 IMPLANT
PENCIL SMOKE EVACUATOR (MISCELLANEOUS) IMPLANT
SLEEVE SCD COMPRESS KNEE MED (STOCKING) ×4 IMPLANT
SOL ANTI FOG 6CC (MISCELLANEOUS) ×3 IMPLANT
SOLUTION ANTI FOG 6CC (MISCELLANEOUS) ×1
SPONGE GAUZE 2X2 8PLY STRL LF (GAUZE/BANDAGES/DRESSINGS) ×4 IMPLANT
SUT CHROMIC 4 0 PS 2 18 (SUTURE) IMPLANT
SUT SILK 2 0 PERMA HAND 18 BK (SUTURE) IMPLANT
SYR 3ML 18GX1 1/2 (SYRINGE) IMPLANT
TOWEL GREEN STERILE FF (TOWEL DISPOSABLE) ×8 IMPLANT
TRACKER ENT INSTRUMENT (MISCELLANEOUS) ×4 IMPLANT
TRACKER ENT PATIENT (MISCELLANEOUS) ×4 IMPLANT
TRAY DSU PREP LF (CUSTOM PROCEDURE TRAY) ×4 IMPLANT
TUBE CONNECTING 20X1/4 (TUBING) ×4 IMPLANT
TUBING STRAIGHTSHOT EPS 5PK (TUBING) ×4 IMPLANT
YANKAUER SUCT BULB TIP NO VENT (SUCTIONS) ×4 IMPLANT

## 2020-10-13 NOTE — Anesthesia Preprocedure Evaluation (Signed)
Anesthesia Evaluation  Patient identified by MRN, date of birth, ID band Patient awake    Reviewed: Allergy & Precautions, NPO status , Patient's Chart, lab work & pertinent test results  Airway Mallampati: II  TM Distance: >3 FB Neck ROM: Full    Dental   Pulmonary asthma , former smoker,    breath sounds clear to auscultation       Cardiovascular negative cardio ROS   Rhythm:Regular Rate:Normal     Neuro/Psych  Neuromuscular disease    GI/Hepatic GERD  ,(+) Hepatitis -  Endo/Other  diabetes, Type 2Hypothyroidism   Renal/GU negative Renal ROS     Musculoskeletal  (+) Arthritis ,   Abdominal   Peds  Hematology negative hematology ROS (+)   Anesthesia Other Findings   Reproductive/Obstetrics                             Lab Results  Component Value Date   WBC 12.5 (H) 10/08/2020   HGB 13.7 10/08/2020   HCT 42.6 10/08/2020   MCV 82.1 10/08/2020   PLT 118 (L) 10/08/2020   Lab Results  Component Value Date   CREATININE 0.64 10/08/2020   BUN 5 (L) 10/08/2020   NA 133 (L) 10/08/2020   K 4.3 10/08/2020   CL 103 10/08/2020   CO2 22 10/08/2020    Anesthesia Physical Anesthesia Plan  ASA: 3  Anesthesia Plan: General   Post-op Pain Management:    Induction: Intravenous  PONV Risk Score and Plan: 3 and Midazolam, Dexamethasone, Ondansetron and Treatment may vary due to age or medical condition  Airway Management Planned: Oral ETT  Additional Equipment: None  Intra-op Plan:   Post-operative Plan: Extubation in OR  Informed Consent: I have reviewed the patients History and Physical, chart, labs and discussed the procedure including the risks, benefits and alternatives for the proposed anesthesia with the patient or authorized representative who has indicated his/her understanding and acceptance.     Dental advisory given  Plan Discussed with:   Anesthesia Plan Comments:          Anesthesia Quick Evaluation

## 2020-10-13 NOTE — Interval H&P Note (Signed)
History and Physical Interval Note:  10/13/2020 1:11 PM  Teresa Castaneda  has presented today for surgery, with the diagnosis of CHRONIC SINUSITIS.  The various methods of treatment have been discussed with the patient and family. After consideration of risks, benefits and other options for treatment, the patient has consented to  Procedure(s): ENDOSCOPIC SINUS SURGERY (N/A) SINUS ENDO WITH FUSION (N/A) ETHMOIDECTOMY (Bilateral) MAXILLARY OSTIA ENLARGEMENT (Bilateral) as a surgical intervention.  The patient's history has been reviewed, patient examined, no change in status, stable for surgery.  I have reviewed the patient's chart and labs.  Questions were answered to the patient's satisfaction.     Melony Overly

## 2020-10-13 NOTE — Anesthesia Postprocedure Evaluation (Signed)
Anesthesia Post Note  Patient: Teresa Castaneda  Procedure(s) Performed: SINUS ENDOSCOPY WITH STEALTH NAVIGATION (Bilateral: Nose) ENDOSCOPIC BILATERAL INFERIOR TURBINATE REDUCTIONS (Bilateral: Nose) BILATERAL TOTAL ETHMOIDECTOMY AND MAXILLARY OSTIA ENLARGEMENTS (Bilateral: Nose)     Patient location during evaluation: PACU Anesthesia Type: General Level of consciousness: awake and alert and oriented Pain management: pain level controlled Vital Signs Assessment: post-procedure vital signs reviewed and stable Respiratory status: spontaneous breathing, nonlabored ventilation and respiratory function stable Cardiovascular status: blood pressure returned to baseline and stable Postop Assessment: no apparent nausea or vomiting Anesthetic complications: no   No notable events documented.  Last Vitals:  Vitals:   10/13/20 1615 10/13/20 1630  BP: (!) 158/77 (!) 149/76  Pulse: (!) 104 98  Resp: (!) 23 20  Temp: 36.9 C   SpO2: 91% 90%    Last Pain:  Vitals:   10/13/20 1630  TempSrc:   PainSc: 4                  Carolene Gitto A.

## 2020-10-13 NOTE — Transfer of Care (Signed)
Immediate Anesthesia Transfer of Care Note  Patient: Teresa Castaneda  Procedure(s) Performed: SINUS ENDOSCOPY WITH STEALTH NAVIGATION (Bilateral: Nose) ENDOSCOPIC BILATERAL INFERIOR TURBINATE REDUCTIONS (Bilateral: Nose) BILATERAL TOTAL ETHMOIDECTOMY AND MAXILLARY OSTIA ENLARGEMENTS (Bilateral: Nose)  Patient Location: PACU  Anesthesia Type:General  Level of Consciousness: awake, alert , oriented and patient cooperative  Airway & Oxygen Therapy: Patient Spontanous Breathing and Patient connected to face mask oxygen  Post-op Assessment: Report given to RN and Post -op Vital signs reviewed and stable  Post vital signs: Reviewed and stable  Last Vitals:  Vitals Value Taken Time  BP 188/94 10/13/20 1600  Temp    Pulse 114 10/13/20 1603  Resp 24 10/13/20 1603  SpO2 91 % 10/13/20 1603  Vitals shown include unvalidated device data.  Last Pain:  Vitals:   10/13/20 1229  TempSrc: Oral  PainSc: 3          Complications: No notable events documented.

## 2020-10-13 NOTE — Brief Op Note (Signed)
10/13/2020  3:49 PM  PATIENT:  Teresa Castaneda  50 y.o. female  PRE-OPERATIVE DIAGNOSIS:  CHRONIC SINUSITIS  POST-OPERATIVE DIAGNOSIS:  CHRONIC SINUSITIS, NASAL OBSTRUCTION  PROCEDURE:  Procedure(s): SINUS ENDOSCOPY WITH STEALTH NAVIGATION (Bilateral) ENDOSCOPIC BILATERAL INFERIOR TURBINATE REDUCTIONS (Bilateral) BILATERAL TOTAL ETHMOIDECTOMY AND MAXILLARY OSTIA ENLARGEMENTS (Bilateral)  SURGEON:  Surgeon(s) and Role:    Rozetta Nunnery, MD - Primary  PHYSICIAN ASSISTANT:   ASSISTANTS: none   ANESTHESIA:   general  EBL:  350 mL   BLOOD ADMINISTERED:none  DRAINS: none   LOCAL MEDICATIONS USED:  XYLOCAINE   SPECIMEN:  No Specimen  DISPOSITION OF SPECIMEN:  N/A  COUNTS:  YES  TOURNIQUET:  * No tourniquets in log *  DICTATION: .Other Dictation: Dictation Number [id number]  PLAN OF CARE: Discharge to home after PACU  PATIENT DISPOSITION:  PACU - hemodynamically stable.   Delay start of Pharmacological VTE agent (>24hrs) due to surgical blood loss or risk of bleeding: yes

## 2020-10-13 NOTE — Anesthesia Procedure Notes (Signed)
Procedure Name: Intubation Date/Time: 10/13/2020 1:21 PM Performed by: Signe Colt, CRNA Pre-anesthesia Checklist: Patient identified, Emergency Drugs available, Suction available and Patient being monitored Patient Re-evaluated:Patient Re-evaluated prior to induction Oxygen Delivery Method: Circle system utilized Preoxygenation: Pre-oxygenation with 100% oxygen Induction Type: IV induction Ventilation: Mask ventilation without difficulty Laryngoscope Size: Mac and 3 Grade View: Grade I Tube type: Oral Tube size: 7.0 mm Number of attempts: 1 Airway Equipment and Method: Stylet and Oral airway Placement Confirmation: ETT inserted through vocal cords under direct vision, positive ETCO2 and breath sounds checked- equal and bilateral Secured at: 21 cm Tube secured with: Tape Dental Injury: Teeth and Oropharynx as per pre-operative assessment

## 2020-10-13 NOTE — Op Note (Signed)
NAME: Teresa Castaneda, Teresa Castaneda MEDICAL RECORD NO: [id number] ACCOUNT NO: [id number] DATE OF BIRTH: [birth year] FACILITY: [facility] LOCATION: [facility] PHYSICIAN: Leonides Sake. Lucia Gaskins, MD  Operative Report   DATE OF PROCEDURE: 10/13/2020  PREOPERATIVE DIAGNOSES:  History of recurrent chronic sinus disease with chronic nasal obstruction and turbinate hypertrophy.  POSTOPERATIVE DIAGNOSES:  History of recurrent chronic sinus disease with chronic nasal obstruction and turbinate hypertrophy.  OPERATION PERFORMED:  Navigation guided functional endoscopic sinus surgery with bilateral total ethmoidectomies and bilateral maxillary ostia enlargement.  Bilateral inferior turbinate reductions with Medtronic turbinate blade.  SURGEON:  Melony Overly, MD  ANESTHESIA:  General endotracheal.  COMPLICATIONS:  None.  ESTIMATED BLOOD LOSS:  350 mL.  BRIEF CLINICAL NOTE:  The patient is a 50 year old female who has had chronic nasal obstruction despite use of nasal steroid spray.  She has also had frequent sinus infections and has taken several rounds of antibiotics in the past.  Recent CT scan  performed 5 months ago demonstrated opacification of the ethmoid area and partial opacification of the nasal frontal area with minimal opacification of the maxillary sinuses.  The frontal sinuses were relatively clear and the sphenoid sinuses were clear.   She is taken to the operating room at this time for functional endoscopic sinus surgery with bilateral ethmoidectomies and bilateral maxillary ostia enlargements with inferior turbinate reductions.  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  DESCRIPTION OF PROCEDURE:  After adequate endotracheal anesthesia, the nose was then further prepped with Betadine solution.  The Fusion guidance system was calibrated.  Nose was packed with Afrin pledgets and the middle meatus was injected with   Xylocaine with epinephrine bilaterally as was the inferior turbinates.  Of note, she had fairly extensive bleeding on maneuver of the middle turbinate with chronic inflammation and moderate swelling.  This occurred throughout the case despite use of  Afrin as well as pledgets soaked in adrenaline.  First, the right middle meatus region was approached.  The uncinate process was incised and removed and then using the microdebrider, anterior and posterior ethmoid areas were opened up on guidance.  The  maxillary ostia was opened up and was enlarged with back cutting forceps.  Curved suction placed within the maxillary sinus revealed minimal mucus within the maxillary sinus, although she did have moderate polypoid disease within the ethmoid area  throughout.  Pledgets were soaked in Afrin and adrenaline, were placed for hemostasis.  Next, the left side was approached.  On the left side, the patient had a large concha bullosa and the medial half of the concha bullosa was removed in addition to  some of the inferior middle turbinate.  The ethmoid area was opened up with the microdebrider anteriorly and posteriorly.  The nasal frontal area was opened up with a microdebrider.  The maxillary sinus was entered and was enlarged with a backbiting  forceps.  Curved suction was placed within the maxillary sinus on the left side and again the left maxillary sinus was relatively clear with minimal mucus within the maxillary sinus.  Again noted was polypoid disease within the ethmoid and concha  bullosa.  This completed the sinus portion of the procedure.  The patient had diffuse bleeding throughout the procedure that was controlled partially with Afrin and adrenaline packing.  Of note, the patient had a posterior septal spur on the left side  and nothing was done to this.  However, on movement of the septum with the nasal speculum, the patient had some bleeding from the septum that was controlled with silver nitrate.   After finishing the sinus procedure, inferior turbinate reductions were  performed with the Medtronic turbinate blade.  The inferior turbinates were hydrated with saline and Xylocaine with epinephrine and then Medtronic turbinate blade was used to provide submucosal reduction of both inferior turbinates and the inferior  turbinates were then outfractured and the anterior turbinate was cauterized with silver nitrate to help stop any bleeding from the entrance of the Medtronic turbinate blade.  Following completion of the case, Nasopore packing soaked in mupirocin ointment  was placed within the middle meatus bilaterally.  No packing was placed within the nasal cavity.  After placement of Afrin pledgets, there was really minimal bleeding and also Arista hemostatic powder was placed within the nasal cavity bilaterally.   Oropharynx was cleaned with suction.  A nasogastric tube was placed in the stomach and no blood was noted within the stomach.  This completed the procedure.  The patient was awoken from anesthesia and transferred to recovery room postop doing well.  She  had a little bit of bleeding from her nose postoperatively.  DISPOSITION:  The patient was discharged home later this morning on Keflex 500 mg b.i.d. for the next 10 days, Tylenol and ibuprofen p.r.n. pain.  She was instructed to start saline nasal irrigations tomorrow and to elevate head of bed and apply cold  compress to the nose to help reduce bleeding tonight.  She will follow up in my office in 5 days for recheck and cleaning the nose.   SHW D: 10/13/2020 4:00:23 pm T: 10/13/2020 11:27:00 pm  JOB: [id number]/ [id number]

## 2020-10-13 NOTE — Discharge Instructions (Addendum)
Elevate head of bed and apply cool compress to nose to reduce swelling and bleeding Start Keflex 500 mg bid for the next 10 days tonight Ibuprofen prn pain Start saline nasal rinses tomorrow  Can use Afrin nasal spray if you have much bleeding   Post Anesthesia Home Care Instructions  Activity: Get plenty of rest for the remainder of the day. A responsible individual must stay with you for 24 hours following the procedure.  For the next 24 hours, DO NOT: -Drive a car -Paediatric nurse -Drink alcoholic beverages -Take any medication unless instructed by your physician -Make any legal decisions or sign important papers.  Meals: Start with liquid foods such as gelatin or soup. Progress to regular foods as tolerated. Avoid greasy, spicy, heavy foods. If nausea and/or vomiting occur, drink only clear liquids until the nausea and/or vomiting subsides. Call your physician if vomiting continues.  Special Instructions/Symptoms: Your throat may feel dry or sore from the anesthesia or the breathing tube placed in your throat during surgery. If this causes discomfort, gargle with warm salt water. The discomfort should disappear within 24 hours.  If you had a scopolamine patch placed behind your ear for the management of post- operative nausea and/or vomiting:  1. The medication in the patch is effective for 72 hours, after which it should be removed.  Wrap patch in a tissue and discard in the trash. Wash hands thoroughly with soap and water. 2. You may remove the patch earlier than 72 hours if you experience unpleasant side effects which may include dry mouth, dizziness or visual disturbances. 3. Avoid touching the patch. Wash your hands with soap and water after contact with the patch.

## 2020-10-14 ENCOUNTER — Encounter (HOSPITAL_BASED_OUTPATIENT_CLINIC_OR_DEPARTMENT_OTHER): Payer: Self-pay | Admitting: Otolaryngology

## 2020-10-15 ENCOUNTER — Other Ambulatory Visit: Payer: Self-pay

## 2020-10-15 ENCOUNTER — Encounter: Payer: Self-pay | Admitting: Internal Medicine

## 2020-10-15 ENCOUNTER — Other Ambulatory Visit: Payer: Self-pay | Admitting: Family Medicine

## 2020-10-15 ENCOUNTER — Ambulatory Visit (INDEPENDENT_AMBULATORY_CARE_PROVIDER_SITE_OTHER): Payer: Commercial Managed Care - PPO | Admitting: Internal Medicine

## 2020-10-15 VITALS — BP 124/86 | HR 88 | Ht 66.0 in | Wt 251.0 lb

## 2020-10-15 DIAGNOSIS — E039 Hypothyroidism, unspecified: Secondary | ICD-10-CM

## 2020-10-15 DIAGNOSIS — E1165 Type 2 diabetes mellitus with hyperglycemia: Secondary | ICD-10-CM | POA: Diagnosis not present

## 2020-10-15 DIAGNOSIS — E1140 Type 2 diabetes mellitus with diabetic neuropathy, unspecified: Secondary | ICD-10-CM

## 2020-10-15 LAB — POCT GLYCOSYLATED HEMOGLOBIN (HGB A1C): Hemoglobin A1C: 8.1 % — AB (ref 4.0–5.6)

## 2020-10-15 MED ORDER — DAPAGLIFLOZIN PROPANEDIOL 10 MG PO TABS: 10.0000 mg | ORAL_TABLET | Freq: Every day | ORAL | 3 refills | Status: DC

## 2020-10-15 MED ORDER — ARMOUR THYROID 180 MG PO TABS: 180.0000 mg | ORAL_TABLET | Freq: Every day | ORAL | 3 refills | Status: DC

## 2020-10-15 NOTE — Patient Instructions (Signed)
-   Continue Metformin 850 mg, 1 tablet with Breakfast and Supper  - Increase Farxiga 10 mg daily  - Restart Armour thyroid 180 mg daily

## 2020-10-15 NOTE — Progress Notes (Signed)
Name: Teresa Castaneda  Age/ Sex: 50 y.o., female   MRN/ DOB: [id number], 24-Feb-1970     PCP: Wendie Agreste, MD   Reason for Endocrinology Evaluation: Type 2 Diabetes Mellitus  Initial Endocrine Consultative Visit: 04/05/2020    PATIENT IDENTIFIER: Teresa Castaneda is a 50 y.o. female with a past medical history of T2DM, cirrhosis  and Hypothyroidism. The patient has followed with Endocrinology clinic since 04/05/2020 for consultative assistance with management of her diabetes.  DIABETIC HISTORY:  Teresa Castaneda was diagnosed with DM in 08/2019, Metformin started 08/2019. Her hemoglobin A1c has ranged from 8.1% in 2022, peaking at 9.8% in 2022. Has mild upset stomach since increasing metformin but its transient     THYROID HISTORY: She has been diagnosed with hypothyroidism ~ 5 yrs ago No prior sx or radiation to the neck She was on Levothyroxine in he past but that didn't  work   Pt is on Armour thyroid 180 mg daily and 15 mg daily , we stopped 50 mg of Armour Thyroid which normalized her TSH.  SUBJECTIVE:   During the last visit (07/09/2020): A1c 8.1 % Started Farxiga and continued Metformin and continued Armour thyroid   Today (10/15/2020): Teresa Castaneda  is here for a follow up on diabetes and hypothyroid management. She checks her blood sugars occasionally . The patient has not had hypoglycemic episodes since the last clinic visit   Has been getting intraarticular injection in the foot due to plantar fasciitis and back pain  Weight stable  Had recent sinus sx  Left foot fracture 4th and 5th metatarsal , in a left foot boot   Denies nausea, vomiting or diarrhea   Recent diagnosis of cirrhosis    Has been out of NP thyroid for 2 weeks      HOME DIABETES REGIMEN:  Metformin 850 mg BID  Farxiga 5 mg daily  Armour thyroid 180 mg daily    Statin: Yes ACE-I/ARB: No Prior Diabetic Education: No   METER DOWNLOAD SUMMARY:  Checks per day 1.1  Average 223  Range 128  -337 mg/dL    DIABETIC COMPLICATIONS: Microvascular complications:   Denies: CKD, retinopathy, neuropathy Last Eye Exam: Completed 03/2020  Macrovascular complications:   Denies: CAD, CVA, PVD   HISTORY:  Past Medical History:  Past Medical History:  Diagnosis Date   Allergies    Anxiety    Arthritis    Asthma    Back pain    Spinal Issue    Diabetes mellitus without complication (HCC)    DJD (degenerative joint disease), lumbar    Elevated cholesterol    Gallstone    GERD (gastroesophageal reflux disease)    Liver cirrhosis secondary to NASH ([place])    Neuromuscular disorder ([place])    Obesity    Oxygen deficiency    Thyroid disease    Past Surgical History:  Past Surgical History:  Procedure Laterality Date   ABDOMINAL HYSTERECTOMY  02/21/2003   CHOLECYSTECTOMY N/A 05/10/2020   Procedure: LAPAROSCOPIC CHOLECYSTECTOMY WITH INTRAOPERATIVE CHOLANGIOGRAM AND LIVER BIOPSY;  Surgeon: Felicie Morn, MD;  Location: [facility];  Service: General;  Laterality: N/A;   ENDOSCOPIC TURBINATE REDUCTION Bilateral 10/13/2020   Procedure: ENDOSCOPIC BILATERAL INFERIOR TURBINATE REDUCTIONS;  Surgeon: Rozetta Nunnery, MD;  Location: [facility];  Service: ENT;  Laterality: Bilateral;   ETHMOIDECTOMY Bilateral 10/13/2020   Procedure: BILATERAL TOTAL ETHMOIDECTOMY AND MAXILLARY OSTIA ENLARGEMENTS;  Surgeon: Rozetta Nunnery, MD;  Location: [facility];  Service: ENT;  Laterality: Bilateral;   LIVER BIOPSY     NASAL SINUS SURGERY Bilateral 10/13/2020   Procedure: SINUS ENDOSCOPY WITH STEALTH NAVIGATION;  Surgeon: Rozetta Nunnery, MD;  Location: [facility];  Service: ENT;  Laterality: Bilateral;   TONSILLECTOMY     Social History:  reports that she quit smoking about a year ago. Her smoking use included cigarettes. She started smoking about 35 years ago. She smoked an average of .5 packs per day. She has never used smokeless tobacco.  She reports that she does not drink alcohol and does not use drugs. Family History:  Family History  Problem Relation Age of Onset   Coronary artery disease Mother    Diabetes Mother    COPD Mother    Coronary artery disease Father    Hypertension Father    Colon cancer Neg Hx    Esophageal cancer Neg Hx      HOME MEDICATIONS: Allergies as of 10/15/2020       Reactions   Clarithromycin Other (See Comments)   Body cramps    Codeine Nausea And Vomiting        Medication List        Accurate as of October 15, 2020  9:13 AM. If you have any questions, ask your nurse or doctor.          albuterol 108 (90 Base) MCG/ACT inhaler Commonly known as: VENTOLIN HFA Inhale 2 puffs into the lungs every 6 (six) hours as needed. For shortness of breath and wheezing   Armour Thyroid 180 MG tablet Generic drug: thyroid Take 180 mg by mouth daily.   atorvastatin 10 MG tablet Commonly known as: LIPITOR Take 1 tablet (10 mg total) by mouth daily.   Azelastine-Fluticasone 137-50 MCG/ACT Susp Commonly known as: Dymista Place 1 spray into the nose in the morning and at bedtime.   cephALEXin 500 MG capsule Commonly known as: Keflex Take 1 capsule (500 mg total) by mouth 2 (two) times daily. 1 tab twice per day for the next 10 days   cholestyramine 4 g packet Commonly known as: Questran Take 1 packet (4 g total) by mouth daily. 2 hours before or after all other medications   dapagliflozin propanediol 5 MG Tabs tablet Commonly known as: Farxiga Take 1 tablet (5 mg total) by mouth daily before breakfast.   diphenoxylate-atropine 2.5-0.025 MG tablet Commonly known as: Lomotil Take 1 tablet by mouth daily as needed for diarrhea or loose stools.   escitalopram 20 MG tablet Commonly known as: Lexapro Take 1 tablet (20 mg total) by mouth daily.   gabapentin 100 MG capsule Commonly known as: NEURONTIN Take 1 capsule (100 mg total) by mouth 3 (three) times daily. 1 po Qam, 2 po  Qpm. What changed: how much to take   ibuprofen 600 MG tablet Commonly known as: ADVIL Take 600 mg by mouth as needed.   levocetirizine 5 MG tablet Commonly known as: XYZAL Take 5 mg by mouth every evening.   lidocaine 5 % Commonly known as: LIDODERM USE 1 PATCH EXTERNALLY ONCE DAILY REMOVE  AND  DISCARD  PATCH  WITH  12  HOURS  OR  AS  DIRECTED  BY  MD   LORazepam 1 MG tablet Commonly known as: ATIVAN Take 1 mg by mouth every 8 (eight) hours as needed for anxiety.   metFORMIN 850 MG tablet Commonly known as: Glucophage Take 1 tablet (850 mg total) by mouth 2 (two) times daily with a  meal.   ondansetron 4 MG tablet Commonly known as: ZOFRAN Take 1 tablet (4 mg total) by mouth every 6 (six) hours.   pantoprazole 40 MG tablet Commonly known as: Protonix Take 1 tablet (40 mg total) by mouth daily.   rifaximin 550 MG Tabs tablet Commonly known as: XIFAXAN Take 1 tablet (550 mg total) by mouth 2 (two) times daily.   Trelegy Ellipta 100-62.5-25 MCG/INH Aepb Generic drug: Fluticasone-Umeclidin-Vilant Inhale 1 puff into the lungs daily.         OBJECTIVE:   Vital Signs: BP 124/86 (BP Location: Left Arm, Patient Position: Sitting, Cuff Size: Large)   Pulse 88   Ht 5' 6"  (1.676 m)   Wt 251 lb (113.9 kg)   SpO2 99%   BMI 40.51 kg/m   Wt Readings from Last 3 Encounters:  10/15/20 251 lb (113.9 kg)  10/13/20 251 lb (113.9 kg)  08/25/20 246 lb (111.6 kg)     Exam: General: Pt appears well and is in NAD  Neck: General: Supple without adenopathy. Thyroid:  No goiter or nodules appreciated.   Lungs: Clear with good BS bilat with no rales, rhonchi, or wheezes  Heart: RRR   Extremities: No pretibial edema on the right, has a left foot boot  Neuro: MS is good with appropriate affect, pt is alert and Ox3     DATA REVIEWED: Results for LAURIANA, DENES (MRN [id number]) as of 07/09/2020 17:00  Ref. Range 07/09/2020 09:42  TSH Latest Ref Range: 0.35 - 4.50 uIU/mL 0.55    Lab Results  Component Value Date   HGBA1C 8.1 (A) 06/09/2020   HGBA1C 9.8 (H) 02/12/2020   Lab Results  Component Value Date   LDLCALC 97 01/28/2020   CREATININE 0.64 10/08/2020   Lab Results  Component Value Date   MICRALBCREAT 11 02/12/2020     Lab Results  Component Value Date   CHOL 158 01/28/2020   HDL 43 01/28/2020   LDLCALC 97 01/28/2020   TRIG 100 01/28/2020   CHOLHDL 3.7 01/28/2020      Results for EBONEY, CLAYBROOK (MRN [id number]) as of 07/09/2020 09:04  Ref. Range 04/12/2020 08:04  Cortisol (Ur), Free Latest Ref Range: 4.0 - 50.0 mcg/24 h 9.3     ASSESSMENT / PLAN / RECOMMENDATIONS:   1) Type 2 Diabetes Mellitus, suboptimally controlled, With out complications - Most recent A1c of 8.1 %. Goal A1c < 7.0 %.    -Her A1c is stable, she attributes hyperglycemia due to multiple steroid injections  -She is tolerating farxiga without side effects, will increase - Intolerant to higher doses of Metformin      MEDICATIONS: Continue Metformin 850 mg, 1 tablet with Breakfast and Supper  Increase  Farxiga 10  mg daily   EDUCATION / INSTRUCTIONS: BG monitoring instructions: Patient is instructed to check her blood sugars 1 times a day, fasting. Call [facility] if: BG persistently < 70  I reviewed the Rule of 15 for the treatment of hypoglycemia in detail with the patient. Literature supplied.    2) Diabetic complications:  Eye: Does not have known diabetic retinopathy.  Neuro/ Feet: Does not have known diabetic peripheral neuropathy .  Renal: Patient does not have known baseline CKD. She   is not on an ACEI/ARB at present.    3) Hypothyroidism:   -  I have advised the patient in the past for my preference to go with Levothyroxine rather then armour thyroid, due to more stability with T4 content  in levothyroxine and also armour thyroid has non-physiologic levels of T4:T3, but she has opted to continue on Armour Thyroid -She has been out for 2  weeks, pt cautioned against myxedema coma with uncontrolled hypothyroidism  - A refill has been sent to today     Medication  Continue armour thyroid 180 mg daily     F/U in 3 months   Signed electronically by: Mack Guise, MD  [facility]  [facility] [address] [place], [place] [address] Phone: [phone] FAX: [phone]   CC: Wendie Agreste, MD [address] [place] [address] Phone: [phone]  Fax: [phone]  Return to Endocrinology clinic as below: Future Appointments  Date Time Provider [place]  10/18/2020  3:40 PM Wendie Agreste, MD [facility]  12/06/2020 10:20 AM Jettie Booze, MD [facility]

## 2020-10-18 ENCOUNTER — Other Ambulatory Visit: Payer: Self-pay

## 2020-10-18 ENCOUNTER — Ambulatory Visit (INDEPENDENT_AMBULATORY_CARE_PROVIDER_SITE_OTHER): Payer: Commercial Managed Care - PPO | Admitting: Family Medicine

## 2020-10-18 VITALS — BP 128/70 | HR 89 | Temp 98.3°F | Resp 16 | Ht 66.0 in | Wt 248.8 lb

## 2020-10-18 DIAGNOSIS — E1140 Type 2 diabetes mellitus with diabetic neuropathy, unspecified: Secondary | ICD-10-CM | POA: Diagnosis not present

## 2020-10-18 DIAGNOSIS — F411 Generalized anxiety disorder: Secondary | ICD-10-CM

## 2020-10-18 DIAGNOSIS — E1165 Type 2 diabetes mellitus with hyperglycemia: Secondary | ICD-10-CM | POA: Diagnosis not present

## 2020-10-18 DIAGNOSIS — F5104 Psychophysiologic insomnia: Secondary | ICD-10-CM

## 2020-10-18 DIAGNOSIS — I700 Atherosclerosis of aorta: Secondary | ICD-10-CM

## 2020-10-18 MED ORDER — ATORVASTATIN CALCIUM 10 MG PO TABS: 10.0000 mg | ORAL_TABLET | Freq: Every day | ORAL | 3 refills | Status: DC

## 2020-10-18 MED ORDER — METFORMIN HCL 850 MG PO TABS: 850.0000 mg | ORAL_TABLET | Freq: Two times a day (BID) | ORAL | 3 refills | Status: DC

## 2020-10-18 MED ORDER — DULOXETINE HCL 30 MG PO CPEP: 30.0000 mg | ORAL_CAPSULE | Freq: Every day | ORAL | 1 refills | Status: DC

## 2020-10-18 MED ORDER — HYDROXYZINE HCL 10 MG PO TABS: 10.0000 mg | ORAL_TABLET | Freq: Three times a day (TID) | ORAL | 0 refills | Status: DC | PRN

## 2020-10-18 NOTE — Patient Instructions (Addendum)
We can try different med for anxiety and depression.  Decrease lexapro to 60m per day for 1 week while you start cymbalta at 382mper day for 1 week. Then stop lexapro and increase cymbalta to 2 pills per day.  Hyrdroxyzine at bedtime initially to help sleep - can be used for anxiety if needed during the day.   Follow up in 6 weeks. Can repeat labs then if needed.   Managing Anxiety, Adult After being diagnosed with an anxiety disorder, you may be relieved to know why you have felt or behaved a certain way. You may also feel overwhelmed about the treatment ahead and what it will mean for your life. With care and support, you can manage this condition and recover from it. How to manage lifestyle changes Managing stress and anxiety Stress is your body's reaction to life changes and events, both good and bad. Most stress will last just a few hours, but stress can be ongoing and can lead to more than just stress. Although stress can play a major role in anxiety, it is not the same as anxiety. Stress is usually caused by something external, such as a deadline, test, or competition. Stress normally passes after the triggering event has ended.  Anxiety is caused by something internal, such as imagining a terrible outcome or worrying that something will go wrong that will devastate you. Anxiety often does not go away even after the triggering event is over, and it can become long-term (chronic) worry. It is important to understand the differences between stress and anxiety and to manage your stress effectively so that it does not lead to an anxious response. Talk with your health care provider or a counselor to learn more about reducing anxiety and stress. He or she may suggest tension reduction techniques, such as: Music therapy. This can include creating or listening to music that you enjoy and that inspires you. Mindfulness-based meditation. This involves being aware of your normal breaths while not trying  to control your breathing. It can be done while sitting or walking. Centering prayer. This involves focusing on a word, phrase, or sacred image that means something to you and brings you peace. Deep breathing. To do this, expand your stomach and inhale slowly through your nose. Hold your breath for 3-5 seconds. Then exhale slowly, letting your stomach muscles relax. Self-talk. This involves identifying thought patterns that lead to anxiety reactions and changing those patterns. Muscle relaxation. This involves tensing muscles and then relaxing them. Choose a tension reduction technique that suits your lifestyle and personality. These techniques take time and practice. Set aside 5-15 minutes a day to do them. Therapists can offer counseling and training in these techniques. The training to help with anxiety may be covered by some insurance plans. Other things you can do to manage stress and anxiety include: Keeping a stress/anxiety diary. This can help you learn what triggers your reaction and then learn ways to manage your response. Thinking about how you react to certain situations. You may not be able to control everything, but you can control your response. Making time for activities that help you relax and not feeling guilty about spending your time in this way. Visual imagery and yoga can help you stay calm and relax.  Medicines Medicines can help ease symptoms. Medicines for anxiety include: Anti-anxiety drugs. Antidepressants. Medicines are often used as a primary treatment for anxiety disorder. Medicines will be prescribed by a health care provider. When used together, medicines, psychotherapy, and  tension reduction techniques may be the most effective treatment. Relationships Relationships can play a big part in helping you recover. Try to spend more time connecting with trusted friends and family members. Consider going to couples counseling, taking family education classes, or going to  family therapy. Therapy can help you and others better understand your condition. How to recognize changes in your anxiety Everyone responds differently to treatment for anxiety. Recovery from anxiety happens when symptoms decrease and stop interfering with your daily activities at home or work. This may mean that you will start to: Have better concentration and focus. Worry will interfere less in your daily thinking. Sleep better. Be less irritable. Have more energy. Have improved memory. It is important to recognize when your condition is getting worse. Contact your health care provider if your symptoms interfere with home or work and you feel like your condition is not improving. Follow these instructions at home: Activity Exercise. Most adults should do the following: Exercise for at least 150 minutes each week. The exercise should increase your heart rate and make you sweat (moderate-intensity exercise). Strengthening exercises at least twice a week. Get the right amount and quality of sleep. Most adults need 7-9 hours of sleep each night. Lifestyle  Eat a healthy diet that includes plenty of vegetables, fruits, whole grains, low-fat dairy products, and lean protein. Do not eat a lot of foods that are high in solid fats, added sugars, or salt. Make choices that simplify your life. Do not use any products that contain nicotine or tobacco, such as cigarettes, e-cigarettes, and chewing tobacco. If you need help quitting, ask your health care provider. Avoid caffeine, alcohol, and certain over-the-counter cold medicines. These may make you feel worse. Ask your pharmacist which medicines to avoid. General instructions Take over-the-counter and prescription medicines only as told by your health care provider. Keep all follow-up visits as told by your health care provider. This is important. Where to find support You can get help and support from these sources: Self-help groups. Online and  [employer]. A trusted spiritual leader. Couples counseling. Family education classes. Family therapy. Where to find more information You may find that joining a support group helps you deal with your anxiety. The following sources can help you locate counselors or support groups near you: [place]: www.mentalhealth[place].net Anxiety and Depression Association of [place] (ADAA): https://www.clark.net/ National Alliance on Mental Illness (NAMI): www.nami.org Contact a health care provider if you: Have a hard time staying focused or finishing daily tasks. Spend many hours a day feeling worried about everyday life. Become exhausted by worry. Start to have headaches, feel tense, or have nausea. Urinate more than normal. Have diarrhea. Get help right away if you have: A racing heart and shortness of breath. Thoughts of hurting yourself or others. If you ever feel like you may hurt yourself or others, or have thoughts about taking your own life, get help right away. You can go to your nearest emergency department or call: Your local emergency services (911 in the [place] A suicide crisis helpline, such as the [place] at (775)029-0149. This is open 24 hours a day. Summary Taking steps to learn and use tension reduction techniques can help calm you and help prevent triggering an anxiety reaction. When used together, medicines, psychotherapy, and tension reduction techniques may be the most effective treatment. Family, friends, and partners can play a big part in helping you recover from an anxiety disorder. This information is not intended to  replace advice given to you by your health care provider. Make sure you discuss any questions you have with your health care provider. Document Revised: 06/04/2018 Document Reviewed: 06/04/2018 [employer] Patient Education  [place].

## 2020-10-18 NOTE — Progress Notes (Signed)
Subjective:  Patient ID: Teresa Castaneda, female    DOB: 1970/03/02  Age: 50 y.o. MRN: [id number]  CC:  Chief Complaint  Patient presents with   Anxiety    Pt in need of refill lexapro, though she feels it is not working very well.    Diabetes    Needs refill metformin,    Hyperlipidemia    Pt due for statin refill     HPI [name] presents for   Anxiety/depression: Last discussed in June, felt like more anxiety and depression at that time, was frustrated by her health issues.  Counseling was unfortunately cost prohibitive.  Lexapro 10 mg daily at that time, initially more helpful but more anxiety since increased liver issues with irritability at that time.  Dosage was increased to 20 mg daily, plan for 1 month follow-up.  Also discussed melatonin for sleep. Does not feel like higher dose of lexapro made a difference. Overwhelmed with health issues, one thing happens after another. Broke foot, sinus surgery. Neuropathy with diabetes, pinched nerve in foot. Unable to go through counseling at this time.  No relief with melatonin. Needs something stringer to sleep.  Increased irritability in mid afternoon.  No personal or FH of Bipolar disorder. Not taking the ativan, but has if needed.   She is followed by endocrinology for diabetes.needs refill of metformin.  Lab Results  Component Value Date   HGBA1C 8.1 (A) 10/15/2020   Hyperlipidemia: Lipitor 10 mg daily, stable lipids in January. Lab Results  Component Value Date   CHOL 158 01/28/2020   HDL 43 01/28/2020   LDLCALC 97 01/28/2020   TRIG 100 01/28/2020   CHOLHDL 3.7 01/28/2020   Lab Results  Component Value Date   ALT 57 (H) 06/22/2020   AST 73 (H) 06/22/2020   ALKPHOS 86 06/22/2020   BILITOT 0.5 06/22/2020     History Patient Active Problem List   Diagnosis Date Noted   Cirrhosis of liver ([place]) 06/23/2020   Symptomatic cholelithiasis 05/10/2020   Acquired hypothyroidism 04/05/2020   Weight gain  04/05/2020   Type 2 diabetes mellitus with hyperglycemia, without long-term current use of insulin ([place]) 04/05/2020   History of COVID-19 09/23/2019   Pneumonia due to COVID-19 virus 09/23/2019   Cough 09/23/2019   BMI 40.0-44.9, adult ([place]) 07/22/2019   Facet arthropathy, lumbar 07/22/2019   Sacroiliitis ([place]) 07/22/2019   ANXIETY 09/10/2006   DEPRESSION 09/10/2006   ASTHMA 09/10/2006   HEADACHE 09/10/2006   Past Medical History:  Diagnosis Date   Allergies    Anxiety    Arthritis    Asthma    Back pain    Spinal Issue    Diabetes mellitus without complication (HCC)    DJD (degenerative joint disease), lumbar    Elevated cholesterol    Gallstone    GERD (gastroesophageal reflux disease)    Liver cirrhosis secondary to NASH ([place])    Neuromuscular disorder ([place])    Obesity    Oxygen deficiency    Thyroid disease    Past Surgical History:  Procedure Laterality Date   ABDOMINAL HYSTERECTOMY  02/21/2003   CHOLECYSTECTOMY N/A 05/10/2020   Procedure: LAPAROSCOPIC CHOLECYSTECTOMY WITH INTRAOPERATIVE CHOLANGIOGRAM AND LIVER BIOPSY;  Surgeon: Felicie Morn, MD;  Location: [facility];  Service: General;  Laterality: N/A;   ENDOSCOPIC TURBINATE REDUCTION Bilateral 10/13/2020   Procedure: ENDOSCOPIC BILATERAL INFERIOR TURBINATE REDUCTIONS;  Surgeon: Rozetta Nunnery, MD;  Location: [facility];  Service: ENT;  Laterality: Bilateral;  ETHMOIDECTOMY Bilateral 10/13/2020   Procedure: BILATERAL TOTAL ETHMOIDECTOMY AND MAXILLARY OSTIA ENLARGEMENTS;  Surgeon: Rozetta Nunnery, MD;  Location: [facility];  Service: ENT;  Laterality: Bilateral;   LIVER BIOPSY     NASAL SINUS SURGERY Bilateral 10/13/2020   Procedure: SINUS ENDOSCOPY WITH STEALTH NAVIGATION;  Surgeon: Rozetta Nunnery, MD;  Location: [facility];  Service: ENT;  Laterality: Bilateral;   TONSILLECTOMY     Allergies  Allergen Reactions   Clarithromycin Other (See  Comments)    Body cramps    Codeine Nausea And Vomiting   Prior to Admission medications   Medication Sig Start Date End Date Taking? Authorizing Provider  albuterol (VENTOLIN HFA) 108 (90 Base) MCG/ACT inhaler Inhale 2 puffs into the lungs every 6 (six) hours as needed. For shortness of breath and wheezing 06/09/20  Yes Wendie Agreste, MD  ARMOUR THYROID 180 MG tablet Take 1 tablet (180 mg total) by mouth daily. 10/15/20  Yes Shamleffer, Melanie Crazier, MD  atorvastatin (LIPITOR) 10 MG tablet Take 1 tablet (10 mg total) by mouth daily. 06/09/20  Yes Wendie Agreste, MD  cephALEXin (KEFLEX) 500 MG capsule Take 1 capsule (500 mg total) by mouth 2 (two) times daily. 1 tab twice per day for the next 10 days 10/13/20  Yes Rozetta Nunnery, MD  dapagliflozin propanediol (FARXIGA) 10 MG TABS tablet Take 1 tablet (10 mg total) by mouth daily before breakfast. Patient taking differently: Take 20 mg by mouth daily before breakfast. 10/15/20  Yes Shamleffer, Melanie Crazier, MD  diphenoxylate-atropine (LOMOTIL) 2.5-0.025 MG tablet Take 1 tablet by mouth daily as needed for diarrhea or loose stools. 08/25/20  Yes Jackquline Denmark, MD  escitalopram (LEXAPRO) 20 MG tablet Take 1 tablet (20 mg total) by mouth daily. 07/09/20  Yes Wendie Agreste, MD  Fluticasone-Umeclidin-Vilant (TRELEGY ELLIPTA) 100-62.5-25 MCG/INH AEPB Inhale 1 puff into the lungs daily. 06/09/20  Yes Wendie Agreste, MD  gabapentin (NEURONTIN) 100 MG capsule Take 1 capsule (100 mg total) by mouth 3 (three) times daily. 1 po Qam, 2 po Qpm. Patient taking differently: Take 300 mg by mouth 3 (three) times daily. 1 po Qam, 2 po Qpm. 08/12/20  Yes Wendie Agreste, MD  ibuprofen (ADVIL) 600 MG tablet Take 600 mg by mouth as needed.   Yes [provider]  levocetirizine (XYZAL) 5 MG tablet Take 5 mg by mouth every evening.   Yes [provider]  lidocaine (LIDODERM) 5 % USE 1 PATCH EXTERNALLY ONCE DAILY REMOVE  AND  DISCARD   PATCH  WITH  12  HOURS  OR  AS  DIRECTED  BY  MD 08/11/20  Yes Wendie Agreste, MD  LORazepam (ATIVAN) 1 MG tablet Take 1 mg by mouth every 8 (eight) hours as needed for anxiety.   Yes [provider]  metFORMIN (GLUCOPHAGE) 850 MG tablet Take 1 tablet (850 mg total) by mouth 2 (two) times daily with a meal. 06/09/20  Yes Wendie Agreste, MD  pantoprazole (PROTONIX) 40 MG tablet Take 1 tablet (40 mg total) by mouth daily. 08/25/20  Yes Jackquline Denmark, MD  rifaximin (XIFAXAN) 550 MG TABS tablet Take 1 tablet (550 mg total) by mouth 2 (two) times daily. 06/04/20  Yes Jackquline Denmark, MD  Azelastine-Fluticasone [facility]) 137-50 MCG/ACT SUSP Place 1 spray into the nose in the morning and at bedtime. Patient not taking: Reported on 10/18/2020 03/10/20   Just, Laurita Quint, FNP  cholestyramine (QUESTRAN) 4 g packet  Take 1 packet (4 g total) by mouth daily. 2 hours before or after all other medications Patient not taking: Reported on 10/18/2020 08/25/20   Jackquline Denmark, MD  ondansetron (ZOFRAN) 4 MG tablet Take 1 tablet (4 mg total) by mouth every 6 (six) hours. Patient not taking: Reported on 10/18/2020 05/10/20   Maczis, Barth Kirks, PA-C  azelastine (ASTELIN) 0.1 % nasal spray Place 2 sprays into both nostrils 2 (two) times daily. 04/23/19 12/26/19  Ok Edwards, PA-C   Social History   Socioeconomic History   Marital status: Single    Spouse name: Not on file   Number of children: Not on file   Years of education: Not on file   Highest education level: Not on file  Occupational History   Not on file  Tobacco Use   Smoking status: Former    Packs/day: 0.50    Types: Cigarettes    Start date: 01/16/1985    Quit date: 10/08/2019    Years since quitting: 1.0   Smokeless tobacco: Never  Vaping Use   Vaping Use: Never used  Substance and Sexual Activity   Alcohol use: No   Drug use: No   Sexual activity: Not on file  Other Topics Concern   Not on file  Social History Narrative   Not on file    Social Determinants of Health   Financial Resource Strain: Not on file  Food Insecurity: Not on file  Transportation Needs: Not on file  Physical Activity: Not on file  Stress: Not on file  Social Connections: Not on file  Intimate Partner Violence: Not on file    Review of Systems Per HPI.   Objective:   Vitals:   10/18/20 1530  BP: 128/70  Pulse: 89  Resp: 16  Temp: 98.3 F (36.8 C)  TempSrc: Temporal  SpO2: 96%  Weight: 248 lb 12.8 oz (112.9 kg)  Height: 5' 6"  (1.676 m)     Physical Exam Vitals reviewed.  Constitutional:      General: She is not in acute distress.    Appearance: Normal appearance. She is well-developed.  HENT:     Head: Normocephalic and atraumatic.  Cardiovascular:     Rate and Rhythm: Normal rate.  Pulmonary:     Effort: Pulmonary effort is normal.  Neurological:     Mental Status: She is alert and oriented to person, place, and time.  Psychiatric:        Mood and Affect: Mood normal.       Assessment & Plan:  Teresa Castaneda is a 50 y.o. female . Anxiety state - Plan: hydrOXYzine (ATARAX/VISTARIL) 10 MG tablet, DULoxetine (CYMBALTA) 30 MG capsule Psychophysiological insomnia - Plan: hydrOXYzine (ATARAX/VISTARIL) 10 MG tablet  -Worsening anxiety likely in part due to adjustment with recurrent health issues.  Minimal change in symptoms at higher dose of Lexapro, would like to try different med.  Unable to pursue counseling at this time.  With other pain/chronic pain, Cymbalta may be helpful.  -  Start 30 mg dose with cross taper of Lexapro to 10 mg for the next 1 week, then stop Lexapro while increasing Cymbalta to 60 mg daily.  Potential side effects discussed.  Hydroxyzine if needed for sleep or anxiety with potential side effects discussed.  Recheck next 6 to 8 weeks.  Sooner if needed.  Type 2 diabetes mellitus with hyperglycemia, without long-term current use of insulin ([place]) - Plan: metFORMIN (GLUCOPHAGE) 850 MG tablet Type 2  diabetes mellitus  with diabetic neuropathy, without long-term current use of insulin ([place]) - Plan: metFORMIN (GLUCOPHAGE) 850 MG tablet Aortic atherosclerosis ([place]) - Plan: atorvastatin (LIPITOR) 10 MG tablet  -Continue follow-up with endocrinologist for diabetes, refilled metformin and Lipitor for now.  Meds ordered this encounter  Medications   metFORMIN (GLUCOPHAGE) 850 MG tablet    Sig: Take 1 tablet (850 mg total) by mouth 2 (two) times daily with a meal.    Dispense:  60 tablet    Refill:  3   atorvastatin (LIPITOR) 10 MG tablet    Sig: Take 1 tablet (10 mg total) by mouth daily.    Dispense:  90 tablet    Refill:  3   hydrOXYzine (ATARAX/VISTARIL) 10 MG tablet    Sig: Take 1 tablet (10 mg total) by mouth 3 (three) times daily as needed for anxiety (or sleep.).    Dispense:  30 tablet    Refill:  0   DULoxetine (CYMBALTA) 30 MG capsule    Sig: Take 1 capsule (30 mg total) by mouth daily. Start once per day and increase to 2 po qd after 1 week if tolerated.    Dispense:  60 capsule    Refill:  1   Patient Instructions  We can try different med for anxiety and depression.  Decrease lexapro to 77m per day for 1 week while you start cymbalta at 328mper day for 1 week. Then stop lexapro and increase cymbalta to 2 pills per day.  Hyrdroxyzine at bedtime initially to help sleep - can be used for anxiety if needed during the day.   Follow up in 6 weeks. Can repeat labs then if needed.   Managing Anxiety, Adult After being diagnosed with an anxiety disorder, you may be relieved to know why you have felt or behaved a certain way. You may also feel overwhelmed about the treatment ahead and what it will mean for your life. With care and support, you can manage this condition and recover from it. How to manage lifestyle changes Managing stress and anxiety Stress is your body's reaction to life changes and events, both good and bad. Most stress will last just a few hours, but stress can be  ongoing and can lead to more than just stress. Although stress can play a major role in anxiety, it is not the same as anxiety. Stress is usually caused by something external, such as a deadline, test, or competition. Stress normally passes after the triggering event has ended.  Anxiety is caused by something internal, such as imagining a terrible outcome or worrying that something will go wrong that will devastate you. Anxiety often does not go away even after the triggering event is over, and it can become long-term (chronic) worry. It is important to understand the differences between stress and anxiety and to manage your stress effectively so that it does not lead to an anxious response. Talk with your health care provider or a counselor to learn more about reducing anxiety and stress. He or she may suggest tension reduction techniques, such as: Music therapy. This can include creating or listening to music that you enjoy and that inspires you. Mindfulness-based meditation. This involves being aware of your normal breaths while not trying to control your breathing. It can be done while sitting or walking. Centering prayer. This involves focusing on a word, phrase, or sacred image that means something to you and brings you peace. Deep breathing. To do this, expand your stomach and inhale slowly  through your nose. Hold your breath for 3-5 seconds. Then exhale slowly, letting your stomach muscles relax. Self-talk. This involves identifying thought patterns that lead to anxiety reactions and changing those patterns. Muscle relaxation. This involves tensing muscles and then relaxing them. Choose a tension reduction technique that suits your lifestyle and personality. These techniques take time and practice. Set aside 5-15 minutes a day to do them. Therapists can offer counseling and training in these techniques. The training to help with anxiety may be covered by some insurance plans. Other things you can do  to manage stress and anxiety include: Keeping a stress/anxiety diary. This can help you learn what triggers your reaction and then learn ways to manage your response. Thinking about how you react to certain situations. You may not be able to control everything, but you can control your response. Making time for activities that help you relax and not feeling guilty about spending your time in this way. Visual imagery and yoga can help you stay calm and relax.  Medicines Medicines can help ease symptoms. Medicines for anxiety include: Anti-anxiety drugs. Antidepressants. Medicines are often used as a primary treatment for anxiety disorder. Medicines will be prescribed by a health care provider. When used together, medicines, psychotherapy, and tension reduction techniques may be the most effective treatment. Relationships Relationships can play a big part in helping you recover. Try to spend more time connecting with trusted friends and family members. Consider going to couples counseling, taking family education classes, or going to family therapy. Therapy can help you and others better understand your condition. How to recognize changes in your anxiety Everyone responds differently to treatment for anxiety. Recovery from anxiety happens when symptoms decrease and stop interfering with your daily activities at home or work. This may mean that you will start to: Have better concentration and focus. Worry will interfere less in your daily thinking. Sleep better. Be less irritable. Have more energy. Have improved memory. It is important to recognize when your condition is getting worse. Contact your health care provider if your symptoms interfere with home or work and you feel like your condition is not improving. Follow these instructions at home: Activity Exercise. Most adults should do the following: Exercise for at least 150 minutes each week. The exercise should increase your heart rate and  make you sweat (moderate-intensity exercise). Strengthening exercises at least twice a week. Get the right amount and quality of sleep. Most adults need 7-9 hours of sleep each night. Lifestyle  Eat a healthy diet that includes plenty of vegetables, fruits, whole grains, low-fat dairy products, and lean protein. Do not eat a lot of foods that are high in solid fats, added sugars, or salt. Make choices that simplify your life. Do not use any products that contain nicotine or tobacco, such as cigarettes, e-cigarettes, and chewing tobacco. If you need help quitting, ask your health care provider. Avoid caffeine, alcohol, and certain over-the-counter cold medicines. These may make you feel worse. Ask your pharmacist which medicines to avoid. General instructions Take over-the-counter and prescription medicines only as told by your health care provider. Keep all follow-up visits as told by your health care provider. This is important. Where to find support You can get help and support from these sources: Self-help groups. Online and [employer]. A trusted spiritual leader. Couples counseling. Family education classes. Family therapy. Where to find more information You may find that joining a support group helps you deal with your anxiety. The following sources can  help you locate counselors or support groups near you: [place]: www.mentalhealthamerica.net Anxiety and Depression Association of [place] (ADAA): https://www.clark.net/ National Alliance on Mental Illness (NAMI): www.nami.org Contact a health care provider if you: Have a hard time staying focused or finishing daily tasks. Spend many hours a day feeling worried about everyday life. Become exhausted by worry. Start to have headaches, feel tense, or have nausea. Urinate more than normal. Have diarrhea. Get help right away if you have: A racing heart and shortness of breath. Thoughts of hurting yourself or  others. If you ever feel like you may hurt yourself or others, or have thoughts about taking your own life, get help right away. You can go to your nearest emergency department or call: Your local emergency services (911 in the [place] A suicide crisis helpline, such as the [facility] at (218)324-7035. This is open 24 hours a day. Summary Taking steps to learn and use tension reduction techniques can help calm you and help prevent triggering an anxiety reaction. When used together, medicines, psychotherapy, and tension reduction techniques may be the most effective treatment. Family, friends, and partners can play a big part in helping you recover from an anxiety disorder. This information is not intended to replace advice given to you by your health care provider. Make sure you discuss any questions you have with your health care provider. Document Revised: 06/04/2018 Document Reviewed: 06/04/2018 Elsevier Patient Education  2022 [place],   Merri Ray, MD [place] [facility], [facility] 10/18/20 4:09 PM

## 2020-10-19 ENCOUNTER — Ambulatory Visit (INDEPENDENT_AMBULATORY_CARE_PROVIDER_SITE_OTHER): Payer: Commercial Managed Care - PPO | Admitting: Otolaryngology

## 2020-10-19 DIAGNOSIS — Z4889 Encounter for other specified surgical aftercare: Secondary | ICD-10-CM

## 2020-10-19 NOTE — Progress Notes (Signed)
HPI: Teresa Castaneda is a 50 y.o. female who presents 6 days s/p FESS with bilateral total ethmoidectomies, bilateral maxillary ostia enlargement and bilateral inferior turbinate reductions.  She has had a lot of congestion in the nose and has been using saline rinses which are difficult to use because of the congestion.  She is on antibiotic Keflex..   Past Medical History:  Diagnosis Date   Allergies    Anxiety    Arthritis    Asthma    Back pain    Spinal Issue    Diabetes mellitus without complication (HCC)    DJD (degenerative joint disease), lumbar    Elevated cholesterol    Gallstone    GERD (gastroesophageal reflux disease)    Liver cirrhosis secondary to NASH ([place])    Neuromuscular disorder ([place])    Obesity    Oxygen deficiency    Thyroid disease    Past Surgical History:  Procedure Laterality Date   ABDOMINAL HYSTERECTOMY  02/21/2003   CHOLECYSTECTOMY N/A 05/10/2020   Procedure: LAPAROSCOPIC CHOLECYSTECTOMY WITH INTRAOPERATIVE CHOLANGIOGRAM AND LIVER BIOPSY;  Surgeon: Felicie Morn, MD;  [facility];  Service: General;  Laterality: N/A;   ENDOSCOPIC TURBINATE REDUCTION Bilateral 10/13/2020   Procedure: ENDOSCOPIC BILATERAL INFERIOR TURBINATE REDUCTIONS;  Surgeon: Rozetta Nunnery, MD;  Location: [place];  Service: ENT;  Laterality: Bilateral;   ETHMOIDECTOMY Bilateral 10/13/2020   Procedure: BILATERAL TOTAL ETHMOIDECTOMY AND MAXILLARY OSTIA ENLARGEMENTS;  Surgeon: Rozetta Nunnery, MD;  Location: [place];  Service: ENT;  Laterality: Bilateral;   LIVER BIOPSY     NASAL SINUS SURGERY Bilateral 10/13/2020   Procedure: SINUS ENDOSCOPY WITH STEALTH NAVIGATION;  Surgeon: Rozetta Nunnery, MD;  Location: [place];  Service: ENT;  Laterality: Bilateral;   TONSILLECTOMY     Social History   Socioeconomic History   Marital status: Single    Spouse name: Not on file   Number of children: Not on file    Years of education: Not on file   Highest education level: Not on file  Occupational History   Not on file  Tobacco Use   Smoking status: Former    Packs/day: 0.50    Types: Cigarettes    Start date: 01/16/1985    Quit date: 10/08/2019    Years since quitting: 1.0   Smokeless tobacco: Never  Vaping Use   Vaping Use: Never used  Substance and Sexual Activity   Alcohol use: No   Drug use: No   Sexual activity: Not on file  Other Topics Concern   Not on file  Social History Narrative   Not on file   Social Determinants of Health   Financial Resource Strain: Not on file  Food Insecurity: Not on file  Transportation Needs: Not on file  Physical Activity: Not on file  Stress: Not on file  Social Connections: Not on file   Family History  Problem Relation Age of Onset   Coronary artery disease Mother    Diabetes Mother    COPD Mother    Coronary artery disease Father    Hypertension Father    Colon cancer Neg Hx    Esophageal cancer Neg Hx    Allergies  Allergen Reactions   Clarithromycin Other (See Comments)    Body cramps    Codeine Nausea And Vomiting   Prior to Admission medications   Medication Sig Start Date End Date Taking? Authorizing Provider  albuterol (VENTOLIN HFA) 108 (90  Base) MCG/ACT inhaler Inhale 2 puffs into the lungs every 6 (six) hours as needed. For shortness of breath and wheezing 06/09/20   Wendie Agreste, MD  ARMOUR THYROID 180 MG tablet Take 1 tablet (180 mg total) by mouth daily. 10/15/20   Shamleffer, Melanie Crazier, MD  atorvastatin (LIPITOR) 10 MG tablet Take 1 tablet (10 mg total) by mouth daily. 10/18/20   Wendie Agreste, MD  Azelastine-Fluticasone [facility]) 137-50 MCG/ACT SUSP Place 1 spray into the nose in the morning and at bedtime. Patient not taking: Reported on 10/18/2020 03/10/20   Just, Laurita Quint, FNP  cephALEXin (KEFLEX) 500 MG capsule Take 1 capsule (500 mg total) by mouth 2 (two) times daily. 1 tab twice per day for the next  10 days 10/13/20   Rozetta Nunnery, MD  diphenoxylate-atropine (LOMOTIL) 2.5-0.025 MG tablet Take 1 tablet by mouth daily as needed for diarrhea or loose stools. 08/25/20   Jackquline Denmark, MD  DULoxetine (CYMBALTA) 30 MG capsule Take 1 capsule (30 mg total) by mouth daily. Start once per day and increase to 2 po qd after 1 week if tolerated. 10/18/20   Wendie Agreste, MD  Fluticasone-Umeclidin-Vilant (TRELEGY ELLIPTA) 100-62.5-25 MCG/INH AEPB Inhale 1 puff into the lungs daily. 06/09/20   Wendie Agreste, MD  gabapentin (NEURONTIN) 100 MG capsule Take 1 capsule (100 mg total) by mouth 3 (three) times daily. 1 po Qam, 2 po Qpm. Patient taking differently: Take 300 mg by mouth 3 (three) times daily. 1 po Qam, 2 po Qpm. 08/12/20   Wendie Agreste, MD  hydrOXYzine (ATARAX/VISTARIL) 10 MG tablet Take 1 tablet (10 mg total) by mouth 3 (three) times daily as needed for anxiety (or sleep.). 10/18/20   Wendie Agreste, MD  ibuprofen (ADVIL) 600 MG tablet Take 600 mg by mouth as needed.    [provider]  levocetirizine (XYZAL) 5 MG tablet Take 5 mg by mouth every evening.    [provider]  lidocaine (LIDODERM) 5 % USE 1 PATCH EXTERNALLY ONCE DAILY REMOVE  AND  DISCARD  PATCH  WITH  12  HOURS  OR  AS  DIRECTED  BY  MD 08/11/20   Wendie Agreste, MD  LORazepam (ATIVAN) 1 MG tablet Take 1 mg by mouth every 8 (eight) hours as needed for anxiety.    [provider]  metFORMIN (GLUCOPHAGE) 850 MG tablet Take 1 tablet (850 mg total) by mouth 2 (two) times daily with a meal. 10/18/20   Wendie Agreste, MD  pantoprazole (PROTONIX) 40 MG tablet Take 1 tablet (40 mg total) by mouth daily. 08/25/20   Jackquline Denmark, MD  rifaximin (XIFAXAN) 550 MG TABS tablet Take 1 tablet (550 mg total) by mouth 2 (two) times daily. 06/04/20   Jackquline Denmark, MD  azelastine (ASTELIN) 0.1 % nasal spray Place 2 sprays into both nostrils 2 (two) times daily. 04/23/19 12/26/19  Ok Edwards, PA-C     Physical  Exam: She has a lot of swelling on both sides of the nose along the inferior turbinates as well as the middle meatus area.  This area was cleaned with forceps along with some crusting.  She is able to breathe better after cleaning left side a bit better right side.   Assessment: S/p FESS and bilateral inferior turbinate reductions  Plan: Recommended continue use of the saline irrigations as well as the antibiotic.  She will restart her Nasacort 2 sprays each nostril daily and follow-up in 6  to 7 days for recheck and cleaning.   Radene Journey, MD

## 2020-10-20 ENCOUNTER — Ambulatory Visit (INDEPENDENT_AMBULATORY_CARE_PROVIDER_SITE_OTHER): Payer: Commercial Managed Care - PPO | Admitting: Gastroenterology

## 2020-10-20 ENCOUNTER — Encounter: Payer: Self-pay | Admitting: Gastroenterology

## 2020-10-20 ENCOUNTER — Other Ambulatory Visit (INDEPENDENT_AMBULATORY_CARE_PROVIDER_SITE_OTHER): Payer: Commercial Managed Care - PPO

## 2020-10-20 ENCOUNTER — Other Ambulatory Visit: Payer: Self-pay

## 2020-10-20 VITALS — Ht 66.0 in | Wt 251.5 lb

## 2020-10-20 DIAGNOSIS — K7581 Nonalcoholic steatohepatitis (NASH): Secondary | ICD-10-CM

## 2020-10-20 DIAGNOSIS — K219 Gastro-esophageal reflux disease without esophagitis: Secondary | ICD-10-CM

## 2020-10-20 DIAGNOSIS — R1011 Right upper quadrant pain: Secondary | ICD-10-CM

## 2020-10-20 LAB — COMPREHENSIVE METABOLIC PANEL
ALT: 41 U/L — ABNORMAL HIGH (ref 0–35)
AST: 27 U/L (ref 0–37)
Albumin: 3.8 g/dL (ref 3.5–5.2)
Alkaline Phosphatase: 94 U/L (ref 39–117)
BUN: 17 mg/dL (ref 6–23)
CO2: 26 mEq/L (ref 19–32)
Calcium: 9 mg/dL (ref 8.4–10.5)
Chloride: 100 mEq/L (ref 96–112)
Creatinine, Ser: 0.6 mg/dL (ref 0.40–1.20)
GFR: 105.79 mL/min (ref >60.00)
Glucose, Bld: 152 mg/dL — ABNORMAL HIGH (ref 70–99)
Potassium: 4.3 mEq/L (ref 3.5–5.1)
Sodium: 134 mEq/L — ABNORMAL LOW (ref 135–145)
Total Bilirubin: 1 mg/dL (ref 0.2–1.2)
Total Protein: 7 g/dL (ref 6.0–8.3)

## 2020-10-20 LAB — CBC WITH DIFFERENTIAL/PLATELET
Basophils Absolute: 0.1 10*3/uL (ref 0.0–0.1)
Basophils Relative: 0.6 % (ref 0.0–3.0)
Eosinophils Absolute: 0.3 10*3/uL (ref 0.0–0.7)
Eosinophils Relative: 1.9 % (ref 0.0–5.0)
HCT: 39.7 % (ref 36.0–46.0)
Hemoglobin: 12.6 g/dL (ref 12.0–15.0)
Lymphocytes Relative: 23.9 % (ref 12.0–46.0)
Lymphs Abs: 4.1 10*3/uL — ABNORMAL HIGH (ref 0.7–4.0)
MCHC: 31.7 g/dL (ref 30.0–36.0)
MCV: 79.5 fl (ref 78.0–100.0)
Monocytes Absolute: 1 10*3/uL (ref 0.1–1.0)
Monocytes Relative: 5.7 % (ref 3.0–12.0)
Neutro Abs: 11.6 10*3/uL — ABNORMAL HIGH (ref 1.4–7.7)
Neutrophils Relative %: 67.9 % (ref 43.0–77.0)
Platelets: 192 10*3/uL (ref 150.0–400.0)
RBC: 5 Mil/uL (ref 3.87–5.11)
RDW: 16.4 % — ABNORMAL HIGH (ref 11.5–15.5)
WBC: 17.1 10*3/uL — ABNORMAL HIGH (ref 4.0–10.5)

## 2020-10-20 LAB — PROTIME-INR
INR: 1.2 ratio — ABNORMAL HIGH (ref 0.8–1.0)
Prothrombin Time: 12.9 s (ref 9.6–13.1)

## 2020-10-20 MED ORDER — OMEPRAZOLE 40 MG PO CPDR: 40.0000 mg | DELAYED_RELEASE_CAPSULE | Freq: Every day | ORAL | 3 refills | Status: DC

## 2020-10-20 NOTE — Progress Notes (Signed)
Chief Complaint: FU  Referring Provider:  Wendie Agreste, MD      ASSESSMENT AND PLAN;   #1. GERD with occ RUQ pain (likely musculoskeletal)  #2. 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 No EV on EGD 07/2020. No ascites.  -Neg extensive work-up. No ETOH.   -AFP was borderline elevated.  Neg CT Abdo/pelvis for any liver masses 05/2020.   -Has subclinical hepatic encephalopathy.  Could not tolerate lactulose. On rifaximin 550 twice daily.  #3. H/O biliary colic s/p lap chole with neg IOC and liver Bx 05/10/2020.  Plan: -Low salt, normal protein diet -CBC, CMP, PT INR, AFP. -[place] abdo complete. -Continue Rifaxamin 581m po BID -Switch to omeprazole 431mpo QD #90 -Discussed weight loss in detail again. Aim to reduce 6lb over next 12 weeks. -Best possible control for DM. -FU in 6 months.     HPI:    Teresa GRINNELLs a [age].o. female  With DM, obesity, DJD, HLD, anxiety, L foot #, sinus Sx  For follow-up visit.  Doing well from GI standpoint.  She told me that omeprazole worked better than Protonix for reflux.  No further diarrhea ever since she stopped hamburgers and wheat bread.  Minimal right-sided abdominal discomfort.  Unfortunately had left foot fracture and is currently in a boot.  Did lose weight previously.  Had Biliary colic (RUQ pain) , underwent lap chole with neg IOC and liver Bx 05/10/2020. [facility].  She does have FH of NASH cirrhosis (mom).  Unfortunately, her mom has passed away due to complications from diabetes.  Could not tolerate lactulose due to diarrhea.  Denies having any significant change in her mental status.  She continues to be on rifaximin.  No skin lesions, easy bruisability, intake of OTC meds including diet pills, herbal medications, anabolic steroids or Tylenol. There is no H/O blood transfusions, IVDA. No  jaundice, dark urine or pale stools. No alcohol abuse.  No chocolates, chewing gums, artificial sweeteners and candy. No NSAIDs.  She would drink 4 Dr. PeSamson Fredericer day.  Has reduced it to 2/day.  She has been able to reduce weight then gained it back Wt Readings from Last 3 Encounters:  10/20/20 251 lb 8 oz (114.1 kg)  10/18/20 248 lb 12.8 oz (112.9 kg)  10/15/20 251 lb (113.9 kg)      Past GI procedures:  Colon 07/2020 - One 6 mm polyp in the proximal ascending colon, removed with a cold snare. Resected and retrieved. Bx- TA - Four 6 to 8 mm polyps in the mid rectum and in the distal sigmoid colon, removed with a cold snare. Resected and retrieved. Bx-hyperplastic. - Non-bleeding internal hemorrhoids. - The examined portion of the ileum was normal. - The examination was otherwise normal on direct and retroflexion views. - Negative random colonic biopsies.  EGD 07/2020 - Mild gastritis. - Salmon-colored mucosa projecting 1 cm above GE junction. Bx- neg for Barrett's. - No esophageal varices. - Negative small bowel biopsies for celiac disease.  CT Abdo/pelvis with contrast 06/09/2020 1. Morphologic features of the liver compatible with cirrhosis. No suspicious liver lesions identified. 2. Splenomegaly. 3. Aortic atherosclerosis.  Additional liver work-up -AFP 6.5 10/15/2020 -ANA 1:40 -Neg AMA, celiac serology, iron studies, alpha-1 antitrypsin, ASMA -Ammonia 55 -Not immune to hepatitis A/B, s/p vaccines    Additional GI history: Op note from lap chole 05/10/2020-reviewed [facility]) -Cirrhotic appearing liver. Bx-as above. -IOC negative -No ascites or any obvious varices  Past Medical History:  Diagnosis Date   Allergies    Anxiety    Arthritis    Asthma    Back pain    Spinal Issue    Diabetes mellitus without complication (HCC)    DJD (degenerative joint disease),  lumbar    Elevated cholesterol    Gallstone    GERD (gastroesophageal reflux disease)    Liver cirrhosis secondary to NASH ([place])    Neuromuscular disorder ([place])    Obesity    Oxygen deficiency    Thyroid disease     Past Surgical History:  Procedure Laterality Date   ABDOMINAL HYSTERECTOMY  02/21/2003   CHOLECYSTECTOMY N/A 05/10/2020   Procedure: LAPAROSCOPIC CHOLECYSTECTOMY WITH INTRAOPERATIVE CHOLANGIOGRAM AND LIVER BIOPSY;  Surgeon: Felicie Morn, MD;  [facility];  Service: General;  Laterality: N/A;   ENDOSCOPIC TURBINATE REDUCTION Bilateral 10/13/2020   Procedure: ENDOSCOPIC BILATERAL INFERIOR TURBINATE REDUCTIONS;  Surgeon: Rozetta Nunnery, MD;  Location: [place];  Service: ENT;  Laterality: Bilateral;   ETHMOIDECTOMY Bilateral 10/13/2020   Procedure: BILATERAL TOTAL ETHMOIDECTOMY AND MAXILLARY OSTIA ENLARGEMENTS;  Surgeon: Rozetta Nunnery, MD;  Location: [place];  Service: ENT;  Laterality: Bilateral;   LIVER BIOPSY     NASAL SINUS SURGERY Bilateral 10/13/2020   Procedure: SINUS ENDOSCOPY WITH STEALTH NAVIGATION;  Surgeon: Rozetta Nunnery, MD;  Location: [place];  Service: ENT;  Laterality: Bilateral;   TONSILLECTOMY      Family History  Problem Relation Age of Onset   Coronary artery disease Mother    Diabetes Mother    COPD Mother    Coronary artery disease Father    Hypertension Father    Colon cancer Neg Hx    Esophageal cancer Neg Hx     Social History   Tobacco Use   Smoking status: Former    Packs/day: 0.50    Types: Cigarettes    Start date: 01/16/1985    Quit date: 10/08/2019    Years since quitting: 1.0   Smokeless tobacco: Never  Vaping Use   Vaping Use: Never used  Substance Use Topics   Alcohol use: No   Drug use: No    Current Outpatient Medications  Medication Sig Dispense Refill   albuterol (VENTOLIN HFA) 108 (90 Base) MCG/ACT inhaler Inhale 2 puffs into the  lungs every 6 (six) hours as needed. For shortness of breath and wheezing 18 g 0   ARMOUR THYROID 180 MG tablet Take 1 tablet (180 mg total) by mouth daily. 90 tablet 3   atorvastatin (LIPITOR) 10 MG tablet Take 1 tablet (10 mg total) by mouth daily. 90 tablet 3   cephALEXin (KEFLEX) 500 MG capsule Take 1 capsule (500 mg total) by mouth 2 (two) times daily. 1 tab twice per day for the next 10 days 20 capsule 0   DULoxetine (CYMBALTA) 30 MG capsule Take 1 capsule (30 mg total) by mouth daily. Start once per day and increase to 2 po qd after 1 week if tolerated. 60 capsule 1   Fluticasone-Umeclidin-Vilant (TRELEGY ELLIPTA) 100-62.5-25 MCG/INH AEPB Inhale 1 puff into the lungs daily. 1 each 6   gabapentin (NEURONTIN) 100 MG capsule Take 1 capsule (100 mg total) by mouth 3 (three) times daily. 1 po Qam, 2 po Qpm. (Patient taking differently: Take 300 mg by mouth 3 (three) times daily.  1 po Qam, 2 po Qpm.) 90 capsule 1   hydrOXYzine (ATARAX/VISTARIL) 10 MG tablet Take 1 tablet (10 mg total) by mouth 3 (three) times daily as needed for anxiety (or sleep.). 30 tablet 0   ibuprofen (ADVIL) 600 MG tablet Take 600 mg by mouth as needed.     levocetirizine (XYZAL) 5 MG tablet Take 5 mg by mouth every evening.     lidocaine (LIDODERM) 5 % USE 1 PATCH EXTERNALLY ONCE DAILY REMOVE  AND  DISCARD  PATCH  WITH  12  HOURS  OR  AS  DIRECTED  BY  MD 30 patch 0   metFORMIN (GLUCOPHAGE) 850 MG tablet Take 1 tablet (850 mg total) by mouth 2 (two) times daily with a meal. 60 tablet 3   pantoprazole (PROTONIX) 40 MG tablet Take 1 tablet (40 mg total) by mouth daily. 30 tablet 11   rifaximin (XIFAXAN) 550 MG TABS tablet Take 1 tablet (550 mg total) by mouth 2 (two) times daily. 60 tablet 3   No current facility-administered medications for this visit.    Allergies  Allergen Reactions   Clarithromycin Other (See Comments)    Body cramps    Codeine Nausea And Vomiting    Review of Systems:  Constitutional: Denies  fever, chills, diaphoresis, appetite change and has fatigue.      Physical Exam:    Ht 5' 6"  (1.676 m)   Wt 251 lb 8 oz (114.1 kg)   SpO2 96%   BMI 40.59 kg/m  Wt Readings from Last 3 Encounters:  10/20/20 251 lb 8 oz (114.1 kg)  10/18/20 248 lb 12.8 oz (112.9 kg)  10/15/20 251 lb (113.9 kg)   Constitutional:  Well-developed, in no acute distress. Psychiatric: Normal mood and affect. Behavior is normal. HEENT: Pupils normal.  Conjunctivae are normal. No scleral icterus.  Cardiovascular: Normal rate, regular rhythm. No edema Pulmonary/chest: Effort normal and breath sounds normal. No wheezing, rales or rhonchi. Abdominal: Soft, nondistended. Nontender. Bowel sounds active throughout. There are no masses palpable.  Liver palpated 2 cm below costal margin.  No ascites. Rectal: Deferred Neurological: Alert and oriented to person place and time. Skin: Skin is warm and dry. No rashes noted. Extremities-no edema.  Boot over left foot  Data Reviewed: I have personally reviewed following labs and imaging studies  CBC: CBC Latest Ref Rng & Units 10/08/2020 06/22/2020 06/02/2020  WBC 4.0 - 10.5 K/uL 12.5(H) 6.4 7.1  Hemoglobin 12.0 - 15.0 g/dL 13.7 12.4 13.1  Hematocrit 36.0 - 46.0 % 42.6 37.5 39.0  Platelets 150 - 400 K/uL 118(L) 117.0(L) 121.0(L)    CMP: CMP Latest Ref Rng & Units 10/08/2020 06/22/2020 06/02/2020  Glucose 70 - 99 mg/dL 254(H) 165(H) 170(H)  BUN 6 - 20 mg/dL 5(L) 7 6  Creatinine 0.44 - 1.00 mg/dL 0.64 0.47 0.44  Sodium 135 - 145 mmol/L 133(L) 134(L) 136  Potassium 3.5 - 5.1 mmol/L 4.3 3.9 4.1  Chloride 98 - 111 mmol/L 103 101 103  CO2 22 - 32 mmol/L 22 24 24   Calcium 8.9 - 10.3 mg/dL 8.4(L) 9.0 8.7  Total Protein 6.0 - 8.3 g/dL - 6.8 6.9  Total Bilirubin 0.2 - 1.2 mg/dL - 0.5 1.0  Alkaline Phos 39 - 117 U/L - 86 88  AST 0 - 37 U/L - 73(H) 43(H)  ALT 0 - 35 U/L - 57(H) 35   Hepatic Function Latest Ref Rng & Units 06/22/2020 06/02/2020 05/10/2020  Total Protein 6.0 -  8.3 g/dL 6.8 6.9  7.4  Albumin 3.5 - 5.2 g/dL 3.4(L) 3.5 3.2(L)  AST 0 - 37 U/L 73(H) 43(H) 47(H)  ALT 0 - 35 U/L 57(H) 35 41  Alk Phosphatase 39 - 117 U/L 86 88 82  Total Bilirubin 0.2 - 1.2 mg/dL 0.5 1.0 0.8  Bilirubin, Direct 0.00 - 0.40 mg/dL - - -     Radiology Studies: No results found.     Carmell Austria, MD 10/20/2020, 8:37 AM  Cc: Wendie Agreste, MD

## 2020-10-20 NOTE — Patient Instructions (Addendum)
If you are age 49 or older, your body mass index should be between 23-30. Your Body mass index is 40.59 kg/m. If this is out of the aforementioned range listed, please consider follow up with your Primary Care Provider.  If you are age 54 or younger, your body mass index should be between 19-25. Your Body mass index is 40.59 kg/m. If this is out of the aformentioned range listed, please consider follow up with your Primary Care Provider.   __________________________________________________________  The [facility] providers would like to encourage you to use [facility] to communicate with providers for non-urgent requests or questions.  Due to long hold times on the telephone, sending your provider a message by [facility] may be a faster and more efficient way to get a response.  Please allow 48 business hours for a response.  Please remember that this is for non-urgent requests.   Please go to the lab on the 2nd floor [address] before you leave the office today.   We have sent the following medications to your pharmacy for you to pick up at your convenience: Omeprazole  Stop Protonix Continue rifaximin Continue low salt diet  You have been scheduled for an abdominal ultrasound at [place]  on  10-22-2020   at  10am     . Please arrive 15 minutes prior to your appointment for registration. Make certain not to have anything to eat or drink 6 hours prior to your appointment. Should you need to reschedule your appointment, please contact radiology at [phone]. This test typically takes about 30 minutes to perform.  Please call in 6 months to schedule an appointment.  Thank you,  Dr. Jackquline Denmark

## 2020-10-21 LAB — AFP TUMOR MARKER: AFP-Tumor Marker: 5.5 ng/mL

## 2020-10-22 ENCOUNTER — Ambulatory Visit (HOSPITAL_BASED_OUTPATIENT_CLINIC_OR_DEPARTMENT_OTHER)
Admission: RE | Admit: 2020-10-22 | Discharge: 2020-10-22 | Disposition: A | Discharge status: Home / Self Care | Payer: Commercial Managed Care - PPO | Source: Ambulatory Visit | Attending: Gastroenterology | Admitting: Gastroenterology

## 2020-10-22 ENCOUNTER — Other Ambulatory Visit: Payer: Self-pay

## 2020-10-22 DIAGNOSIS — K219 Gastro-esophageal reflux disease without esophagitis: Secondary | ICD-10-CM | POA: Insufficient documentation

## 2020-10-22 DIAGNOSIS — K7581 Nonalcoholic steatohepatitis (NASH): Secondary | ICD-10-CM | POA: Diagnosis present

## 2020-10-22 DIAGNOSIS — R1011 Right upper quadrant pain: Secondary | ICD-10-CM | POA: Diagnosis present

## 2020-10-25 ENCOUNTER — Other Ambulatory Visit: Payer: Self-pay

## 2020-10-25 ENCOUNTER — Ambulatory Visit (INDEPENDENT_AMBULATORY_CARE_PROVIDER_SITE_OTHER): Payer: Commercial Managed Care - PPO | Admitting: Otolaryngology

## 2020-10-25 DIAGNOSIS — Z4889 Encounter for other specified surgical aftercare: Secondary | ICD-10-CM

## 2020-10-25 NOTE — Progress Notes (Signed)
HPI: Teresa Castaneda is a 50 y.o. female who presents 13 days s/p FESS and turbinate reductions.  She is starting to breathe a little bit better.  She has been using the Nasacort at night and saline rinses during the day..   Past Medical History:  Diagnosis Date   Allergies    Anxiety    Arthritis    Asthma    Back pain    Spinal Issue    Diabetes mellitus without complication (HCC)    DJD (degenerative joint disease), lumbar    Elevated cholesterol    Gallstone    GERD (gastroesophageal reflux disease)    Liver cirrhosis secondary to NASH ([place])    Neuromuscular disorder ([place])    Obesity    Oxygen deficiency    Thyroid disease    Past Surgical History:  Procedure Laterality Date   ABDOMINAL HYSTERECTOMY  02/21/2003   CHOLECYSTECTOMY N/A 05/10/2020   Procedure: LAPAROSCOPIC CHOLECYSTECTOMY WITH INTRAOPERATIVE CHOLANGIOGRAM AND LIVER BIOPSY;  Surgeon: Felicie Morn, MD;  [facility];  Service: General;  Laterality: N/A;   ENDOSCOPIC TURBINATE REDUCTION Bilateral 10/13/2020   Procedure: ENDOSCOPIC BILATERAL INFERIOR TURBINATE REDUCTIONS;  Surgeon: Rozetta Nunnery, MD;  Location: [place];  Service: ENT;  Laterality: Bilateral;   ETHMOIDECTOMY Bilateral 10/13/2020   Procedure: BILATERAL TOTAL ETHMOIDECTOMY AND MAXILLARY OSTIA ENLARGEMENTS;  Surgeon: Rozetta Nunnery, MD;  Location: [place];  Service: ENT;  Laterality: Bilateral;   LIVER BIOPSY     NASAL SINUS SURGERY Bilateral 10/13/2020   Procedure: SINUS ENDOSCOPY WITH STEALTH NAVIGATION;  Surgeon: Rozetta Nunnery, MD;  Location: [place];  Service: ENT;  Laterality: Bilateral;   TONSILLECTOMY     Social History   Socioeconomic History   Marital status: Single    Spouse name: Not on file   Number of children: Not on file   Years of education: Not on file   Highest education level: Not on file  Occupational History   Not on file  Tobacco Use    Smoking status: Former    Packs/day: 0.50    Types: Cigarettes    Start date: 01/16/1985    Quit date: 10/08/2019    Years since quitting: 1.0   Smokeless tobacco: Never  Vaping Use   Vaping Use: Never used  Substance and Sexual Activity   Alcohol use: No   Drug use: No   Sexual activity: Not on file  Other Topics Concern   Not on file  Social History Narrative   Not on file   Social Determinants of Health   Financial Resource Strain: Not on file  Food Insecurity: Not on file  Transportation Needs: Not on file  Physical Activity: Not on file  Stress: Not on file  Social Connections: Not on file   Family History  Problem Relation Age of Onset   Coronary artery disease Mother    Diabetes Mother    COPD Mother    Coronary artery disease Father    Hypertension Father    Colon cancer Neg Hx    Esophageal cancer Neg Hx    Allergies  Allergen Reactions   Clarithromycin Other (See Comments)    Body cramps    Codeine Nausea And Vomiting   Prior to Admission medications   Medication Sig Start Date End Date Taking? Authorizing Provider  albuterol (VENTOLIN HFA) 108 (90 Base) MCG/ACT inhaler Inhale 2 puffs into the lungs every 6 (six) hours as needed. For shortness  of breath and wheezing 06/09/20   Wendie Agreste, MD  ARMOUR THYROID 180 MG tablet Take 1 tablet (180 mg total) by mouth daily. 10/15/20   Shamleffer, Melanie Crazier, MD  atorvastatin (LIPITOR) 10 MG tablet Take 1 tablet (10 mg total) by mouth daily. 10/18/20   Wendie Agreste, MD  cephALEXin (KEFLEX) 500 MG capsule Take 1 capsule (500 mg total) by mouth 2 (two) times daily. 1 tab twice per day for the next 10 days 10/13/20   Rozetta Nunnery, MD  DULoxetine (CYMBALTA) 30 MG capsule Take 1 capsule (30 mg total) by mouth daily. Start once per day and increase to 2 po qd after 1 week if tolerated. 10/18/20   Wendie Agreste, MD  Fluticasone-Umeclidin-Vilant (TRELEGY ELLIPTA) 100-62.5-25 MCG/INH AEPB Inhale 1 puff  into the lungs daily. 06/09/20   Wendie Agreste, MD  gabapentin (NEURONTIN) 100 MG capsule Take 1 capsule (100 mg total) by mouth 3 (three) times daily. 1 po Qam, 2 po Qpm. Patient taking differently: Take 300 mg by mouth 3 (three) times daily. 1 po Qam, 2 po Qpm. 08/12/20   Wendie Agreste, MD  hydrOXYzine (ATARAX/VISTARIL) 10 MG tablet Take 1 tablet (10 mg total) by mouth 3 (three) times daily as needed for anxiety (or sleep.). 10/18/20   Wendie Agreste, MD  ibuprofen (ADVIL) 600 MG tablet Take 600 mg by mouth as needed.    [provider]  levocetirizine (XYZAL) 5 MG tablet Take 5 mg by mouth every evening.    [provider]  lidocaine (LIDODERM) 5 % USE 1 PATCH EXTERNALLY ONCE DAILY REMOVE  AND  DISCARD  PATCH  WITH  12  HOURS  OR  AS  DIRECTED  BY  MD 08/11/20   Wendie Agreste, MD  metFORMIN (GLUCOPHAGE) 850 MG tablet Take 1 tablet (850 mg total) by mouth 2 (two) times daily with a meal. 10/18/20   Wendie Agreste, MD  omeprazole (PRILOSEC) 40 MG capsule Take 1 capsule (40 mg total) by mouth daily. 10/20/20   Jackquline Denmark, MD  rifaximin (XIFAXAN) 550 MG TABS tablet Take 1 tablet (550 mg total) by mouth 2 (two) times daily. 06/04/20   Jackquline Denmark, MD  azelastine (ASTELIN) 0.1 % nasal spray Place 2 sprays into both nostrils 2 (two) times daily. 04/23/19 12/26/19  Ok Edwards, PA-C     Physical Exam: Nasal passages are much less swollen today and she is able to move air through the nasal passages.  Still has a little bit of crusting and scabbing along the inferior turbinates that was removed in the office today.  Middle meatus is still edematous with nasal pore packing still in place.  Nasal passages were cleaned in the office today.   Assessment: S/p FESS and turbinate reductions 2 weeks ago.  Plan: She will continue with saline irrigations and Nasacort use at night. She will follow-up in 1 week for recheck and cleaning.   Radene Journey, MD

## 2020-11-01 ENCOUNTER — Ambulatory Visit (INDEPENDENT_AMBULATORY_CARE_PROVIDER_SITE_OTHER): Payer: Commercial Managed Care - PPO | Admitting: Sports Medicine

## 2020-11-01 ENCOUNTER — Ambulatory Visit (INDEPENDENT_AMBULATORY_CARE_PROVIDER_SITE_OTHER): Payer: Commercial Managed Care - PPO | Admitting: Otolaryngology

## 2020-11-01 ENCOUNTER — Other Ambulatory Visit: Payer: Self-pay

## 2020-11-01 VITALS — BP 124/68 | Ht 66.0 in | Wt 250.0 lb

## 2020-11-01 DIAGNOSIS — M461 Sacroiliitis, not elsewhere classified: Secondary | ICD-10-CM

## 2020-11-01 DIAGNOSIS — Z4889 Encounter for other specified surgical aftercare: Secondary | ICD-10-CM

## 2020-11-01 MED ORDER — IBUPROFEN 600 MG PO TABS: 600.0000 mg | ORAL_TABLET | Freq: Two times a day (BID) | ORAL | 0 refills | Status: DC

## 2020-11-01 NOTE — Progress Notes (Signed)
HPI: Teresa Castaneda is a 50 y.o. female who presents 3 weeks s/p FESS with bilateral inferior turbinate reductions.  Her breathing is doing better.  She still gets crusting and scabbing out of both nostrils..   Past Medical History:  Diagnosis Date   Allergies    Anxiety    Arthritis    Asthma    Back pain    Spinal Issue    Diabetes mellitus without complication (HCC)    DJD (degenerative joint disease), lumbar    Elevated cholesterol    Gallstone    GERD (gastroesophageal reflux disease)    Liver cirrhosis secondary to NASH ([place])    Neuromuscular disorder ([place])    Obesity    Oxygen deficiency    Thyroid disease    Past Surgical History:  Procedure Laterality Date   ABDOMINAL HYSTERECTOMY  02/21/2003   CHOLECYSTECTOMY N/A 05/10/2020   Procedure: LAPAROSCOPIC CHOLECYSTECTOMY WITH INTRAOPERATIVE CHOLANGIOGRAM AND LIVER BIOPSY;  Surgeon: Felicie Morn, MD;  Location: [facility];  Service: General;  Laterality: N/A;   ENDOSCOPIC TURBINATE REDUCTION Bilateral 10/13/2020   Procedure: ENDOSCOPIC BILATERAL INFERIOR TURBINATE REDUCTIONS;  Surgeon: Rozetta Nunnery, MD;  Location: [facility];  Service: ENT;  Laterality: Bilateral;   ETHMOIDECTOMY Bilateral 10/13/2020   Procedure: BILATERAL TOTAL ETHMOIDECTOMY AND MAXILLARY OSTIA ENLARGEMENTS;  Surgeon: Rozetta Nunnery, MD;  Location: [facility];  Service: ENT;  Laterality: Bilateral;   LIVER BIOPSY     NASAL SINUS SURGERY Bilateral 10/13/2020   Procedure: SINUS ENDOSCOPY WITH STEALTH NAVIGATION;  Surgeon: Rozetta Nunnery, MD;  Location: [facility];  Service: ENT;  Laterality: Bilateral;   TONSILLECTOMY     Social History   Socioeconomic History   Marital status: Single    Spouse name: Not on file   Number of children: Not on file   Years of education: Not on file   Highest education level: Not on file  Occupational History   Not on file  Tobacco Use   Smoking status:  Former    Packs/day: 0.50    Types: Cigarettes    Start date: 01/16/1985    Quit date: 10/08/2019    Years since quitting: 1.0   Smokeless tobacco: Never  Vaping Use   Vaping Use: Never used  Substance and Sexual Activity   Alcohol use: No   Drug use: No   Sexual activity: Not on file  Other Topics Concern   Not on file  Social History Narrative   Not on file   Social Determinants of Health   Financial Resource Strain: Not on file  Food Insecurity: Not on file  Transportation Needs: Not on file  Physical Activity: Not on file  Stress: Not on file  Social Connections: Not on file   Family History  Problem Relation Age of Onset   Coronary artery disease Mother    Diabetes Mother    COPD Mother    Coronary artery disease Father    Hypertension Father    Colon cancer Neg Hx    Esophageal cancer Neg Hx    Allergies  Allergen Reactions   Clarithromycin Other (See Comments)    Body cramps    Codeine Nausea And Vomiting   Prior to Admission medications   Medication Sig Start Date End Date Taking? Authorizing Provider  albuterol (VENTOLIN HFA) 108 (90 Base) MCG/ACT inhaler Inhale 2 puffs into the lungs every 6 (six) hours as needed. For shortness of breath and wheezing 06/09/20  Wendie Agreste, MD  ARMOUR THYROID 180 MG tablet Take 1 tablet (180 mg total) by mouth daily. 10/15/20   Shamleffer, Melanie Crazier, MD  atorvastatin (LIPITOR) 10 MG tablet Take 1 tablet (10 mg total) by mouth daily. 10/18/20   Wendie Agreste, MD  cephALEXin (KEFLEX) 500 MG capsule Take 1 capsule (500 mg total) by mouth 2 (two) times daily. 1 tab twice per day for the next 10 days 10/13/20   Rozetta Nunnery, MD  DULoxetine (CYMBALTA) 30 MG capsule Take 1 capsule (30 mg total) by mouth daily. Start once per day and increase to 2 po qd after 1 week if tolerated. 10/18/20   Wendie Agreste, MD  Fluticasone-Umeclidin-Vilant (TRELEGY ELLIPTA) 100-62.5-25 MCG/INH AEPB Inhale 1 puff into the lungs  daily. 06/09/20   Wendie Agreste, MD  gabapentin (NEURONTIN) 100 MG capsule Take 1 capsule (100 mg total) by mouth 3 (three) times daily. 1 po Qam, 2 po Qpm. Patient taking differently: Take 300 mg by mouth 3 (three) times daily. 1 po Qam, 2 po Qpm. 08/12/20   Wendie Agreste, MD  hydrOXYzine (ATARAX/VISTARIL) 10 MG tablet Take 1 tablet (10 mg total) by mouth 3 (three) times daily as needed for anxiety (or sleep.). 10/18/20   Wendie Agreste, MD  ibuprofen (ADVIL) 600 MG tablet Take 1 tablet (600 mg total) by mouth in the morning and at bedtime. 11/01/20   Thurman Coyer, DO  levocetirizine (XYZAL) 5 MG tablet Take 5 mg by mouth every evening.    [provider]  lidocaine (LIDODERM) 5 % USE 1 PATCH EXTERNALLY ONCE DAILY REMOVE  AND  DISCARD  PATCH  WITH  12  HOURS  OR  AS  DIRECTED  BY  MD 08/11/20   Wendie Agreste, MD  metFORMIN (GLUCOPHAGE) 850 MG tablet Take 1 tablet (850 mg total) by mouth 2 (two) times daily with a meal. 10/18/20   Wendie Agreste, MD  omeprazole (PRILOSEC) 40 MG capsule Take 1 capsule (40 mg total) by mouth daily. 10/20/20   Jackquline Denmark, MD  rifaximin (XIFAXAN) 550 MG TABS tablet Take 1 tablet (550 mg total) by mouth 2 (two) times daily. 06/04/20   Jackquline Denmark, MD  azelastine (ASTELIN) 0.1 % nasal spray Place 2 sprays into both nostrils 2 (two) times daily. 04/23/19 12/26/19  Ok Edwards, PA-C     Physical Exam: She still has mild to moderate crusting on the inferior turbinates and some packing crusting within the middle meatus.  This was partially removed in the office today.  Nasal passages are doing nicely.   Assessment: S/p FESS with bilateral total ethmoidectomies and bilateral maxillary ostia enlargement and inferior turbinate reductions 3 weeks ago.  Plan: She is doing well. She will continue with the saline rinses 2 or 3 times a day as well as use of nasal steroid spray at night.  She will follow-up in 7 to 10 days for recheck and  cleaning.   Radene Journey, MD

## 2020-11-01 NOTE — Progress Notes (Signed)
Patient ID: Teresa Castaneda, female   DOB: Feb 10, 1970, 49 y.o.   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  She has had 3 or 4 injections and each injection provides her with good pain relief initially.  An MRI of her lumbar spine done in June of this year was unremarkable.  Physical exam was not repeated today.  She is simply asking what next steps in treatment are.  Since she has had a positive response to diagnostic SI joint injections in the past, I recommended a referral to Dr. Lynann Bologna for further work-up and treatment of this area. She is in agreement with that plan.  She will follow-up with me as needed.

## 2020-11-01 NOTE — Patient Instructions (Addendum)
Dr Phylliss Bob 11/15/20 at [address], [place], [place] [address] Phone: [phone]

## 2020-11-04 ENCOUNTER — Other Ambulatory Visit: Payer: Self-pay

## 2020-11-04 ENCOUNTER — Ambulatory Visit (INDEPENDENT_AMBULATORY_CARE_PROVIDER_SITE_OTHER): Payer: Commercial Managed Care - PPO | Admitting: Pulmonary Disease

## 2020-11-04 ENCOUNTER — Encounter: Payer: Self-pay | Admitting: Pulmonary Disease

## 2020-11-04 DIAGNOSIS — J4540 Moderate persistent asthma, uncomplicated: Secondary | ICD-10-CM

## 2020-11-04 MED ORDER — ALBUTEROL SULFATE HFA 108 (90 BASE) MCG/ACT IN AERS: 2.0000 | INHALATION_SPRAY | Freq: Four times a day (QID) | RESPIRATORY_TRACT | 3 refills | Status: DC | PRN

## 2020-11-04 MED ORDER — TRELEGY ELLIPTA 100-62.5-25 MCG/ACT IN AEPB: 1.0000 | INHALATION_SPRAY | Freq: Every day | RESPIRATORY_TRACT | 5 refills | Status: DC

## 2020-11-04 NOTE — Patient Instructions (Signed)
Continue trelegy ellipta 1 puff daily - rinse mouth after each use  Continue to use albuterol as needed

## 2020-11-04 NOTE — Progress Notes (Signed)
Synopsis: Referred in September 2021 for shortness of breath, followed for moderate persistent asthma.   Subjective:   PATIENT ID: Teresa Castaneda GENDER: female DOB: 03/04/1970, MRN: [id number]  Sinus surgery 3 weeks ago Albuterol 1-2x per week NASH cirrhosis First time her breathing has been under control  HPI  Chief Complaint  Patient presents with   Follow-up    F/U for asthma. States she has been doing well since last visit. Had sinus surgery about 3 weeks ago.     Teresa Castaneda is a 50 year old woman, former smoker with asthma, history Covid 19 pneumonia, GERD and nocturnal hypoxemia who returns to pulmonary clinic for follow up of asthma.  She continues to do well from a respiratory standpoint. She had sinus surgery 3 weeks ago and is having sinus drainage which leads to cough. She is using saline sinus rinses and nasonex spray 1 spray per nostril daily. She has close follow up with ENT.   She was recently diagnosed with NASH cirrhosis and is taking rifaximin.   She broke her foot recently and has been inactive due to this otherwise she was working on weight loss prior to this happening.  OV 02/10/20 She reports feeling significantly better since starting Trelegy Ellipta daily. She has used her albuterol inhaler 2 times in the past 2 weeks. She has started using 2L of oxygen at night and has also noticed improvement in her shortness of breath. She still experiences dyspnea with exertion but overall improved since last visit. She saw Dr. Irish Lack of Cardiology on 12/09/19 for her exertional dyspnea and family history of early CAD. She was started on atorvastatin 58m daily for hyperlipidemia. She is taking lasix as needed for shortness of breath and lower extremity edema.  OV 10/2019 Recent labs show an IgE level of 621 and absolute eosinophil count of 400. CBC also indicates low platelets of 122k and 129k over the past two lab draws in October and July. She also had elevated  LFTs in July, AST 41/ALT 58.     Pulmonary function tests show normal spirometry, lung volumes and DLCO and no significant change to bronchodilators.    She had an echo done on 10/20/19 which showed normal EF 633-29% grade I diastolic dysfunction. Normal RV systolic function and RVSP of 27.4 mmHg. Mildly dilated left atrial size.   She had a home sleep study done recently which showed an AHI of 2.9 and 50.5 minutes out of 370 minutes she had oxygen desaturations below 89%.     Past Medical History:  Diagnosis Date   Allergies    Anxiety    Arthritis    Asthma    Back pain    Spinal Issue    Diabetes mellitus without complication (HCC)    DJD (degenerative joint disease), lumbar    Elevated cholesterol    Gallstone    GERD (gastroesophageal reflux disease)    Liver cirrhosis secondary to NASH ([place]    Neuromuscular disorder (HCC)    Obesity    Oxygen deficiency    Thyroid disease      Family History  Problem Relation Age of Onset   Coronary artery disease Mother    Diabetes Mother    COPD Mother    Coronary artery disease Father    Hypertension Father    Colon cancer Neg Hx    Esophageal cancer Neg Hx      Social History   Socioeconomic History   Marital status: Single  Spouse name: Not on file   Number of children: Not on file   Years of education: Not on file   Highest education level: Not on file  Occupational History   Not on file  Tobacco Use   Smoking status: Former    Packs/day: 0.50    Types: Cigarettes    Start date: 01/16/1985    Quit date: 10/08/2019    Years since quitting: 1.0   Smokeless tobacco: Never  Vaping Use   Vaping Use: Never used  Substance and Sexual Activity   Alcohol use: No   Drug use: No   Sexual activity: Not on file  Other Topics Concern   Not on file  Social History Narrative   Not on file   Social Determinants of Health   Financial Resource Strain: Not on file  Food Insecurity: Not on file  Transportation Needs: Not  on file  Physical Activity: Not on file  Stress: Not on file  Social Connections: Not on file  Intimate Partner Violence: Not on file     Allergies  Allergen Reactions   Clarithromycin Other (See Comments)    Body cramps    Codeine Nausea And Vomiting   Tylenol [Acetaminophen] Other (See Comments)    Can not take Tylenol products due to liver     Outpatient Medications Prior to Visit  Medication Sig Dispense Refill   ARMOUR THYROID 180 MG tablet Take 1 tablet (180 mg total) by mouth daily. 90 tablet 3   atorvastatin (LIPITOR) 10 MG tablet Take 1 tablet (10 mg total) by mouth daily. 90 tablet 3   DULoxetine (CYMBALTA) 30 MG capsule Take 1 capsule (30 mg total) by mouth daily. Start once per day and increase to 2 po qd after 1 week if tolerated. 60 capsule 1   gabapentin (NEURONTIN) 100 MG capsule Take 1 capsule (100 mg total) by mouth 3 (three) times daily. 1 po Qam, 2 po Qpm. (Patient taking differently: Take 300 mg by mouth 3 (three) times daily. 1 po Qam, 2 po Qpm.) 90 capsule 1   hydrOXYzine (ATARAX/VISTARIL) 10 MG tablet Take 1 tablet (10 mg total) by mouth 3 (three) times daily as needed for anxiety (or sleep.). 30 tablet 0   ibuprofen (ADVIL) 600 MG tablet Take 1 tablet (600 mg total) by mouth in the morning and at bedtime. 30 tablet 0   levocetirizine (XYZAL) 5 MG tablet Take 5 mg by mouth every evening.     lidocaine (LIDODERM) 5 % USE 1 PATCH EXTERNALLY ONCE DAILY REMOVE  AND  DISCARD  PATCH  WITH  12  HOURS  OR  AS  DIRECTED  BY  MD 30 patch 0   metFORMIN (GLUCOPHAGE) 850 MG tablet Take 1 tablet (850 mg total) by mouth 2 (two) times daily with a meal. 60 tablet 3   omeprazole (PRILOSEC) 40 MG capsule Take 1 capsule (40 mg total) by mouth daily. 90 capsule 3   rifaximin (XIFAXAN) 550 MG TABS tablet Take 1 tablet (550 mg total) by mouth 2 (two) times daily. 60 tablet 3   albuterol (VENTOLIN HFA) 108 (90 Base) MCG/ACT inhaler Inhale 2 puffs into the lungs every 6 (six) hours as  needed. For shortness of breath and wheezing 18 g 0   Fluticasone-Umeclidin-Vilant (TRELEGY ELLIPTA) 100-62.5-25 MCG/INH AEPB Inhale 1 puff into the lungs daily. 1 each 6   cephALEXin (KEFLEX) 500 MG capsule Take 1 capsule (500 mg total) by mouth 2 (two) times daily. 1 tab twice  per day for the next 10 days 20 capsule 0   No facility-administered medications prior to visit.    Review of Systems  Constitutional:  Negative for chills, diaphoresis, fever, malaise/fatigue and weight loss.  HENT:  Negative for congestion, ear pain, nosebleeds, sinus pain and sore throat.   Eyes:  Negative for blurred vision.  Respiratory:  Negative for cough, hemoptysis, sputum production, shortness of breath and wheezing.   Cardiovascular:  Negative for chest pain, palpitations, orthopnea, claudication, leg swelling and PND.  Gastrointestinal:  Negative for abdominal pain, blood in stool, heartburn, nausea and vomiting.  Genitourinary:  Negative for hematuria.  Musculoskeletal:  Negative for joint pain and myalgias.  Skin:  Negative for itching and rash.  Neurological:  Negative for dizziness, weakness and headaches.  Endo/Heme/Allergies:  Does not bruise/bleed easily.  Psychiatric/Behavioral: Negative.     Objective:   Vitals:   11/04/20 0858  BP: 122/74  Pulse: 88  SpO2: 96%  Weight: 248 lb 9.6 oz (112.8 kg)  Height: 5' 6"  (1.676 m)   Physical Exam Constitutional:      General: She is not in acute distress.    Appearance: She is obese.  HENT:     Head: Normocephalic and atraumatic.     Nose: Nose normal.     Mouth/Throat:     Mouth: Mucous membranes are moist.     Pharynx: Oropharynx is clear.  Eyes:     General: No scleral icterus.    Extraocular Movements: Extraocular movements intact.     Conjunctiva/sclera: Conjunctivae normal.     Pupils: Pupils are equal, round, and reactive to light.  Cardiovascular:     Rate and Rhythm: Normal rate and regular rhythm.     Pulses: Normal pulses.      Heart sounds: Normal heart sounds. No murmur heard. Pulmonary:     Effort: Pulmonary effort is normal.     Breath sounds: No decreased air movement. No wheezing, rhonchi or rales.  Musculoskeletal:     Right lower leg: No edema.     Left lower leg: No edema.  Skin:    General: Skin is warm and dry.     Findings: No rash.  Neurological:     General: No focal deficit present.     Mental Status: She is alert and oriented to person, place, and time. Mental status is at baseline.  Psychiatric:        Mood and Affect: Mood normal.        Behavior: Behavior normal.        Thought Content: Thought content normal.        Judgment: Judgment normal.    CBC    Component Value Date/Time   WBC 17.1 (H) 10/20/2020 0910   RBC 5.00 10/20/2020 0910   HGB 12.6 10/20/2020 0910   HGB 13.7 03/25/2020 1520   HCT 39.7 10/20/2020 0910   HCT 39.9 03/25/2020 1520   PLT 192.0 10/20/2020 0910   PLT 124 (L) 03/25/2020 1520   MCV 79.5 10/20/2020 0910   MCV 83 03/25/2020 1520   MCH 26.4 10/08/2020 1510   MCHC 31.7 10/20/2020 0910   RDW 16.4 (H) 10/20/2020 0910   RDW 12.9 03/25/2020 1520   LYMPHSABS 4.1 (H) 10/20/2020 0910   LYMPHSABS 2.8 03/25/2020 1520   MONOABS 1.0 10/20/2020 0910   EOSABS 0.3 10/20/2020 0910   EOSABS 0.2 03/25/2020 1520   BASOSABS 0.1 10/20/2020 0910   BASOSABS 0.0 03/25/2020 1520   BMP Latest Ref Rng &  Units 10/20/2020 10/08/2020 06/22/2020  Glucose 70 - 99 mg/dL 152(H) 254(H) 165(H)  BUN 6 - 23 mg/dL 17 5(L) 7  Creatinine 0.40 - 1.20 mg/dL 0.60 0.64 0.47  BUN/Creat Ratio 9 - 23 - - -  Sodium 135 - 145 mEq/L 134(L) 133(L) 134(L)  Potassium 3.5 - 5.1 mEq/L 4.3 4.3 3.9  Chloride 96 - 112 mEq/L 100 103 101  CO2 19 - 32 mEq/L 26 22 24   Calcium 8.4 - 10.5 mg/dL 9.0 8.4(L) 9.0   Chest imaging:  CXR 09/12/19  Heart size is normal. Mediastinal shadows are normal. Mild patchy infiltrates and or volume loss in the mid and lower lungs. No dense consolidation or lobar collapse.  No effusions. No significant bone finding.  09/23/19 The heart size and mediastinal contours are unchanged. Persistent trace bi basilar airspace opacities. No new focal consolidation. The visualized skeletal structures are unremarkable.  10/03/19: Hazy bibasilar airspace disease. No other focal parenchymal opacity. No pleural effusion or pneumothorax. Stable cardiomediastinal silhouette. No aggressive osseous lesion.    PFT: Post FEV1/FVC: 85 FEV1: 2.36L (77%) FVC: 2.79L (72%) TLC: 4.79L (89%) DLCO 121%  Labs: Reviewed as above  ECHO 10/20/19 1. Left ventricular ejection fraction, by estimation, is 65 to 70%. The  left ventricle has normal function. The left ventricle has no regional  wall motion abnormalities. There is mild concentric left ventricular  hypertrophy. Left ventricular diastolic  parameters are consistent with Grade I diastolic dysfunction (impaired  relaxation). Elevated left atrial pressure.   2. Right ventricular systolic function is normal. The right ventricular  size is normal. There is normal pulmonary artery systolic pressure. The  estimated right ventricular systolic pressure is 70.9 mmHg.   3. Left atrial size was mildly dilated.   4. The mitral valve is normal in structure. Mild mitral valve  regurgitation. No evidence of mitral stenosis.   5. The aortic valve is normal in structure. Aortic valve regurgitation is  not visualized. No aortic stenosis is present.   6. The inferior vena cava is normal in size with greater than 50%  respiratory variability, suggesting right atrial pressure of 3 mmHg.   Abdominal US 11/21/19 1.  Cholelithiasis without evidence of cholecystitis.   2. Diffusely increased liver parenchymal echogenicity which is most commonly seen with hepatic steatosis. There is an ill-defined hypoechoic area adjacent to the gallbladder fossa which is indeterminate but favored to represent focal fatty sparing.  Assessment & Plan:    Moderate persistent asthma without complication - Plan: albuterol (VENTOLIN HFA) 108 (90 Base) MCG/ACT inhaler  Discussion: Teresa Castaneda is a 50 year old woman, former smoker with asthma, history Covid 19 pneumonia, GERD, NASH cirrhosis and nocturnal hypoxemia who returns to pulmonary clinic for follow up.   She is doing well on trelegy ellipta for her asthma and is to continue 2L of oxygen at night. She can continue albuterol as needed.   She is to follow up in 9 months.  Freda Jackson, MD [facility] Office: [phone]   Current Outpatient Medications:    ARMOUR THYROID 180 MG tablet, Take 1 tablet (180 mg total) by mouth daily., Disp: 90 tablet, Rfl: 3   atorvastatin (LIPITOR) 10 MG tablet, Take 1 tablet (10 mg total) by mouth daily., Disp: 90 tablet, Rfl: 3   DULoxetine (CYMBALTA) 30 MG capsule, Take 1 capsule (30 mg total) by mouth daily. Start once per day and increase to 2 po qd after 1 week if tolerated., Disp: 60 capsule, Rfl:  1   Fluticasone-Umeclidin-Vilant (TRELEGY ELLIPTA) 100-62.5-25 MCG/ACT AEPB, Inhale 1 puff into the lungs daily., Disp: 60 each, Rfl: 5   gabapentin (NEURONTIN) 100 MG capsule, Take 1 capsule (100 mg total) by mouth 3 (three) times daily. 1 po Qam, 2 po Qpm. (Patient taking differently: Take 300 mg by mouth 3 (three) times daily. 1 po Qam, 2 po Qpm.), Disp: 90 capsule, Rfl: 1   hydrOXYzine (ATARAX/VISTARIL) 10 MG tablet, Take 1 tablet (10 mg total) by mouth 3 (three) times daily as needed for anxiety (or sleep.)., Disp: 30 tablet, Rfl: 0   ibuprofen (ADVIL) 600 MG tablet, Take 1 tablet (600 mg total) by mouth in the morning and at bedtime., Disp: 30 tablet, Rfl: 0   levocetirizine (XYZAL) 5 MG tablet, Take 5 mg by mouth every evening., Disp: , Rfl:    lidocaine (LIDODERM) 5 %, USE 1 PATCH EXTERNALLY ONCE DAILY REMOVE  AND  DISCARD  PATCH  WITH  12  HOURS  OR  AS  DIRECTED  BY  MD, Disp: 30 patch, Rfl: 0   metFORMIN (GLUCOPHAGE)  850 MG tablet, Take 1 tablet (850 mg total) by mouth 2 (two) times daily with a meal., Disp: 60 tablet, Rfl: 3   omeprazole (PRILOSEC) 40 MG capsule, Take 1 capsule (40 mg total) by mouth daily., Disp: 90 capsule, Rfl: 3   rifaximin (XIFAXAN) 550 MG TABS tablet, Take 1 tablet (550 mg total) by mouth 2 (two) times daily., Disp: 60 tablet, Rfl: 3   albuterol (VENTOLIN HFA) 108 (90 Base) MCG/ACT inhaler, Inhale 2 puffs into the lungs every 6 (six) hours as needed. For shortness of breath and wheezing, Disp: 18 g, Rfl: 3

## 2020-11-08 ENCOUNTER — Other Ambulatory Visit: Payer: Self-pay | Admitting: Gastroenterology

## 2020-11-10 ENCOUNTER — Ambulatory Visit (INDEPENDENT_AMBULATORY_CARE_PROVIDER_SITE_OTHER): Payer: Commercial Managed Care - PPO | Admitting: Otolaryngology

## 2020-11-10 ENCOUNTER — Other Ambulatory Visit: Payer: Self-pay

## 2020-11-10 DIAGNOSIS — Z4889 Encounter for other specified surgical aftercare: Secondary | ICD-10-CM

## 2020-11-10 NOTE — Progress Notes (Signed)
HPI: Teresa Castaneda is a 50 y.o. female who presents 4 weeks s/p FESS with inferior turbinate reductions.  She has been doing well and using the saline rinses twice a day as well as Nasacort at night.  She is got a couple scabs out of her nose.Marland Kitchen   Past Medical History:  Diagnosis Date   Allergies    Anxiety    Arthritis    Asthma    Back pain    Spinal Issue    Diabetes mellitus without complication (HCC)    DJD (degenerative joint disease), lumbar    Elevated cholesterol    Gallstone    GERD (gastroesophageal reflux disease)    Liver cirrhosis secondary to NASH ([place])    Neuromuscular disorder ([place])    Obesity    Oxygen deficiency    Thyroid disease    Past Surgical History:  Procedure Laterality Date   ABDOMINAL HYSTERECTOMY  02/21/2003   CHOLECYSTECTOMY N/A 05/10/2020   Procedure: LAPAROSCOPIC CHOLECYSTECTOMY WITH INTRAOPERATIVE CHOLANGIOGRAM AND LIVER BIOPSY;  Surgeon: Felicie Morn, MD;  Location: [facility];  Service: General;  Laterality: N/A;   ENDOSCOPIC TURBINATE REDUCTION Bilateral 10/13/2020   Procedure: ENDOSCOPIC BILATERAL INFERIOR TURBINATE REDUCTIONS;  Surgeon: Rozetta Nunnery, MD;  Location: [place];  Service: ENT;  Laterality: Bilateral;   ETHMOIDECTOMY Bilateral 10/13/2020   Procedure: BILATERAL TOTAL ETHMOIDECTOMY AND MAXILLARY OSTIA ENLARGEMENTS;  Surgeon: Rozetta Nunnery, MD;  Location: [place];  Service: ENT;  Laterality: Bilateral;   LIVER BIOPSY     NASAL SINUS SURGERY Bilateral 10/13/2020   Procedure: SINUS ENDOSCOPY WITH STEALTH NAVIGATION;  Surgeon: Rozetta Nunnery, MD;  Location: [place];  Service: ENT;  Laterality: Bilateral;   TONSILLECTOMY     Social History   Socioeconomic History   Marital status: Single    Spouse name: Not on file   Number of children: Not on file   Years of education: Not on file   Highest education level: Not on file  Occupational History   Not on  file  Tobacco Use   Smoking status: Former    Packs/day: 0.50    Types: Cigarettes    Start date: 01/16/1985    Quit date: 10/08/2019    Years since quitting: 1.0   Smokeless tobacco: Never  Vaping Use   Vaping Use: Never used  Substance and Sexual Activity   Alcohol use: No   Drug use: No   Sexual activity: Not on file  Other Topics Concern   Not on file  Social History Narrative   Not on file   Social Determinants of Health   Financial Resource Strain: Not on file  Food Insecurity: Not on file  Transportation Needs: Not on file  Physical Activity: Not on file  Stress: Not on file  Social Connections: Not on file   Family History  Problem Relation Age of Onset   Coronary artery disease Mother    Diabetes Mother    COPD Mother    Coronary artery disease Father    Hypertension Father    Colon cancer Neg Hx    Esophageal cancer Neg Hx    Allergies  Allergen Reactions   Clarithromycin Other (See Comments)    Body cramps    Codeine Nausea And Vomiting   Tylenol [Acetaminophen] Other (See Comments)    Can not take Tylenol products due to liver   Prior to Admission medications   Medication Sig Start Date End Date Taking?  Authorizing Provider  albuterol (VENTOLIN HFA) 108 (90 Base) MCG/ACT inhaler Inhale 2 puffs into the lungs every 6 (six) hours as needed. For shortness of breath and wheezing 11/04/20   Freddi Starr, MD  ARMOUR THYROID 180 MG tablet Take 1 tablet (180 mg total) by mouth daily. 10/15/20   Shamleffer, Melanie Crazier, MD  atorvastatin (LIPITOR) 10 MG tablet Take 1 tablet (10 mg total) by mouth daily. 10/18/20   Wendie Agreste, MD  DULoxetine (CYMBALTA) 30 MG capsule Take 1 capsule (30 mg total) by mouth daily. Start once per day and increase to 2 po qd after 1 week if tolerated. 10/18/20   Wendie Agreste, MD  Fluticasone-Umeclidin-Vilant (TRELEGY ELLIPTA) 100-62.5-25 MCG/ACT AEPB Inhale 1 puff into the lungs daily. 11/04/20   Freddi Starr, MD   gabapentin (NEURONTIN) 100 MG capsule Take 1 capsule (100 mg total) by mouth 3 (three) times daily. 1 po Qam, 2 po Qpm. Patient taking differently: Take 300 mg by mouth 3 (three) times daily. 1 po Qam, 2 po Qpm. 08/12/20   Wendie Agreste, MD  hydrOXYzine (ATARAX/VISTARIL) 10 MG tablet Take 1 tablet (10 mg total) by mouth 3 (three) times daily as needed for anxiety (or sleep.). 10/18/20   Wendie Agreste, MD  ibuprofen (ADVIL) 600 MG tablet Take 1 tablet (600 mg total) by mouth in the morning and at bedtime. 11/01/20   Thurman Coyer, DO  levocetirizine (XYZAL) 5 MG tablet Take 5 mg by mouth every evening.    [provider]  lidocaine (LIDODERM) 5 % USE 1 PATCH EXTERNALLY ONCE DAILY REMOVE  AND  DISCARD  PATCH  WITH  12  HOURS  OR  AS  DIRECTED  BY  MD 08/11/20   Wendie Agreste, MD  metFORMIN (GLUCOPHAGE) 850 MG tablet Take 1 tablet (850 mg total) by mouth 2 (two) times daily with a meal. 10/18/20   Wendie Agreste, MD  mometasone (NASONEX) 50 MCG/ACT nasal spray Place 1 spray into the nose daily. 1 spray per nostril daily    [provider]  omeprazole (PRILOSEC) 40 MG capsule Take 1 capsule (40 mg total) by mouth daily. 10/20/20   Jackquline Denmark, MD  XIFAXAN 550 MG TABS tablet Take 1 tablet by mouth twice daily 11/08/20   Jackquline Denmark, MD  azelastine (ASTELIN) 0.1 % nasal spray Place 2 sprays into both nostrils 2 (two) times daily. 04/23/19 12/26/19  Ok Edwards, PA-C     Physical Exam: On exam today she still has moderate scabbing worse on the left side that was removed from the left inferior turbinate and middle turbinate region.  She has small scab on the right side.  She has a slight adhesion between the inferior turbinate and the septum on the right side where she has some crusting.  But this is minimal.  The middle meatus regions are clear bilaterally although she still has a little bit of crusting within the middle meatus.  Nasal passages are clear she is breathing  well.  1 large scab was removed from the left nostril and some small scabs from the right nostril.   Assessment: S/p FESS with inferior turbinate reductions performed 4 weeks ago.  She still has some scabbing and crusting that will last for another week or 2.  Plan: Patient is doing well and breathing much better with no signs of infection. Recommended continue use of the saline rinse for another 2 weeks as well as use of the  Nasacort. Reviewed with her concerning my retirement at the end of the week and that if she has any infections or problems she will have to follow-up with one of the other ENT specialist.   Radene Journey, MD

## 2020-11-11 ENCOUNTER — Ambulatory Visit
Admission: RE | Admit: 2020-11-11 | Discharge: 2020-11-11 | Disposition: A | Discharge status: Home / Self Care | Payer: Commercial Managed Care - PPO | Source: Ambulatory Visit | Attending: Specialist | Admitting: Specialist

## 2020-11-11 ENCOUNTER — Other Ambulatory Visit: Payer: Self-pay | Admitting: Specialist

## 2020-11-11 DIAGNOSIS — R600 Localized edema: Secondary | ICD-10-CM

## 2020-11-11 DIAGNOSIS — L539 Erythematous condition, unspecified: Secondary | ICD-10-CM

## 2020-11-14 ENCOUNTER — Encounter: Payer: Self-pay | Admitting: Internal Medicine

## 2020-11-15 ENCOUNTER — Other Ambulatory Visit: Payer: Self-pay

## 2020-11-15 DIAGNOSIS — E1140 Type 2 diabetes mellitus with diabetic neuropathy, unspecified: Secondary | ICD-10-CM

## 2020-11-15 DIAGNOSIS — E1165 Type 2 diabetes mellitus with hyperglycemia: Secondary | ICD-10-CM

## 2020-11-16 ENCOUNTER — Encounter: Payer: Self-pay | Admitting: Registered Nurse

## 2020-11-16 ENCOUNTER — Ambulatory Visit (INDEPENDENT_AMBULATORY_CARE_PROVIDER_SITE_OTHER): Payer: Commercial Managed Care - PPO | Admitting: Registered Nurse

## 2020-11-16 ENCOUNTER — Other Ambulatory Visit: Payer: Self-pay

## 2020-11-16 ENCOUNTER — Other Ambulatory Visit: Payer: Self-pay | Admitting: Orthopedic Surgery

## 2020-11-16 VITALS — BP 128/68 | HR 99 | Temp 98.2°F | Resp 18 | Ht 66.0 in | Wt 251.4 lb

## 2020-11-16 DIAGNOSIS — R936 Abnormal findings on diagnostic imaging of limbs: Secondary | ICD-10-CM | POA: Diagnosis not present

## 2020-11-16 DIAGNOSIS — M5459 Other low back pain: Secondary | ICD-10-CM

## 2020-11-16 MED ORDER — FUROSEMIDE 20 MG PO TABS: 20.0000 mg | ORAL_TABLET | Freq: Two times a day (BID) | ORAL | 0 refills | Status: DC

## 2020-11-16 MED ORDER — POTASSIUM CHLORIDE ER 10 MEQ PO TBCR: 10.0000 meq | EXTENDED_RELEASE_TABLET | Freq: Every day | ORAL | 0 refills | Status: DC

## 2020-11-16 NOTE — Progress Notes (Signed)
Established Patient Office Visit  Subjective:  Patient ID: Teresa Castaneda, female    DOB: 09/12/1970  Age: 50 y.o. MRN: [id number]  CC:  Chief Complaint  Patient presents with   Leg Swelling    Patient states she is here for some leg swelling in her right leg.Patient states she went to the specialist and its no blood clots but she needs an MRI    HPI Teresa Castaneda presents for R leg swelling  Ongoing. Seen by neuro who gave lasix 30m po qd. Has had limited effect Swelling is uncomfortable No redness or heat.  Had UKoreadoppler RLE on Friday that showed no dVt, but did show 1.2cm lesion in midthigh. Suggested Mri for further characterization. She is interested in having this done.  Otherwise doing well. L foot still in boot, frustrated that she has six more weeks in the boot.   Past Medical History:  Diagnosis Date   Allergies    Anxiety    Arthritis    Asthma    Back pain    Spinal Issue    Diabetes mellitus without complication (HCC)    DJD (degenerative joint disease), lumbar    Elevated cholesterol    Gallstone    GERD (gastroesophageal reflux disease)    Liver cirrhosis secondary to NASH ([place]    Neuromuscular disorder ([place]    Obesity    Oxygen deficiency    Thyroid disease     Past Surgical History:  Procedure Laterality Date   ABDOMINAL HYSTERECTOMY  02/21/2003   CHOLECYSTECTOMY N/A 05/10/2020   Procedure: LAPAROSCOPIC CHOLECYSTECTOMY WITH INTRAOPERATIVE CHOLANGIOGRAM AND LIVER BIOPSY;  Surgeon: SFelicie Morn MD;  [facility];  Service: General;  Laterality: N/A;   ENDOSCOPIC TURBINATE REDUCTION Bilateral 10/13/2020   Procedure: ENDOSCOPIC BILATERAL INFERIOR TURBINATE REDUCTIONS;  Surgeon: NRozetta Nunnery MD;  [place] [facility]  Service: ENT;  Laterality: Bilateral;   ETHMOIDECTOMY Bilateral 10/13/2020   Procedure: BILATERAL TOTAL ETHMOIDECTOMY AND MAXILLARY OSTIA ENLARGEMENTS;  Surgeon: NRozetta Nunnery MD;   [place] [facility]  Service: ENT;  Laterality: Bilateral;   LIVER BIOPSY     NASAL SINUS SURGERY Bilateral 10/13/2020   Procedure: SINUS ENDOSCOPY WITH STEALTH NAVIGATION;  Surgeon: NRozetta Nunnery MD;  [place] [facility]  Service: ENT;  Laterality: Bilateral;   TONSILLECTOMY      Family History  Problem Relation Age of Onset   Coronary artery disease Mother    Diabetes Mother    COPD Mother    Coronary artery disease Father    Hypertension Father    Colon cancer Neg Hx    Esophageal cancer Neg Hx     Social History   Socioeconomic History   Marital status: Single    Spouse name: Not on file   Number of children: Not on file   Years of education: Not on file   Highest education level: Not on file  Occupational History   Not on file  Tobacco Use   Smoking status: Former    Packs/day: 0.50    Types: Cigarettes    Start date: 01/16/1985    Quit date: 10/08/2019    Years since quitting: 1.1   Smokeless tobacco: Never  Vaping Use   Vaping Use: Never used  Substance and Sexual Activity   Alcohol use: No   Drug use: No   Sexual activity: Not on file  Other Topics Concern   Not on file  Social History  Narrative   Not on file   Social Determinants of Health   Financial Resource Strain: Not on file  Food Insecurity: Not on file  Transportation Needs: Not on file  Physical Activity: Not on file  Stress: Not on file  Social Connections: Not on file  Intimate Partner Violence: Not on file    Outpatient Medications Prior to Visit  Medication Sig Dispense Refill   albuterol (VENTOLIN HFA) 108 (90 Base) MCG/ACT inhaler Inhale 2 puffs into the lungs every 6 (six) hours as needed. For shortness of breath and wheezing 18 g 3   ARMOUR THYROID 180 MG tablet Take 1 tablet (180 mg total) by mouth daily. 90 tablet 3   atorvastatin (LIPITOR) 10 MG tablet Take 1 tablet (10 mg total) by mouth daily. 90 tablet 3   DULoxetine (CYMBALTA) 30 MG  capsule Take 1 capsule (30 mg total) by mouth daily. Start once per day and increase to 2 po qd after 1 week if tolerated. 60 capsule 1   Fluticasone-Umeclidin-Vilant (TRELEGY ELLIPTA) 100-62.5-25 MCG/ACT AEPB Inhale 1 puff into the lungs daily. 60 each 5   gabapentin (NEURONTIN) 100 MG capsule Take 1 capsule (100 mg total) by mouth 3 (three) times daily. 1 po Qam, 2 po Qpm. (Patient taking differently: Take 300 mg by mouth 3 (three) times daily. 1 po Qam, 2 po Qpm.) 90 capsule 1   hydrOXYzine (ATARAX/VISTARIL) 10 MG tablet Take 1 tablet (10 mg total) by mouth 3 (three) times daily as needed for anxiety (or sleep.). 30 tablet 0   ibuprofen (ADVIL) 600 MG tablet Take 1 tablet (600 mg total) by mouth in the morning and at bedtime. 30 tablet 0   levocetirizine (XYZAL) 5 MG tablet Take 5 mg by mouth every evening.     lidocaine (LIDODERM) 5 % USE 1 PATCH EXTERNALLY ONCE DAILY REMOVE  AND  DISCARD  PATCH  WITH  12  HOURS  OR  AS  DIRECTED  BY  MD 30 patch 0   metFORMIN (GLUCOPHAGE) 850 MG tablet Take 1 tablet (850 mg total) by mouth 2 (two) times daily with a meal. 60 tablet 3   mometasone (NASONEX) 50 MCG/ACT nasal spray Place 1 spray into the nose daily. 1 spray per nostril daily     omeprazole (PRILOSEC) 40 MG capsule Take 1 capsule (40 mg total) by mouth daily. 90 capsule 3   XIFAXAN 550 MG TABS tablet Take 1 tablet by mouth twice daily 60 tablet 12   furosemide (LASIX) 20 MG tablet Take 20 mg by mouth every morning.     No facility-administered medications prior to visit.    Allergies  Allergen Reactions   Clarithromycin Other (See Comments)    Body cramps    Codeine Nausea And Vomiting   Tylenol [Acetaminophen] Other (See Comments)    Can not take Tylenol products due to liver    ROS Review of Systems  Constitutional: Negative.   HENT: Negative.    Eyes: Negative.   Respiratory: Negative.    Cardiovascular: Negative.   Gastrointestinal: Negative.   Genitourinary: Negative.    Musculoskeletal: Negative.   Skin: Negative.   Neurological: Negative.   Psychiatric/Behavioral: Negative.    All other systems reviewed and are negative.    Objective:    Physical Exam Vitals and nursing note reviewed.  Constitutional:      General: She is not in acute distress.    Appearance: Normal appearance. She is normal weight. She is not ill-appearing, toxic-appearing or  diaphoretic.  Cardiovascular:     Rate and Rhythm: Normal rate and regular rhythm.     Heart sounds: Normal heart sounds. No murmur heard.   No friction rub. No gallop.  Pulmonary:     Effort: Pulmonary effort is normal. No respiratory distress.     Breath sounds: Normal breath sounds. No stridor. No wheezing, rhonchi or rales.  Chest:     Chest wall: No tenderness.  Musculoskeletal:     Right lower leg: Edema (+3 pitting.) present.  Skin:    General: Skin is warm and dry.     Capillary Refill: Capillary refill takes less than 2 seconds. Pulses 1+ in R dorsalis pedis and posterior tibial. Neurological:     General: No focal deficit present.     Mental Status: She is alert and oriented to person, place, and time. Mental status is at baseline.  Psychiatric:        Mood and Affect: Mood normal.        Behavior: Behavior normal.        Thought Content: Thought content normal.        Judgment: Judgment normal.    BP 128/68   Pulse 99   Temp 98.2 F (36.8 C) (Temporal)   Resp 18   Ht 5' 6"  (1.676 m)   Wt 251 lb 6.4 oz (114 kg)   BMI 40.58 kg/m  Wt Readings from Last 3 Encounters:  11/16/20 251 lb 6.4 oz (114 kg)  11/04/20 248 lb 9.6 oz (112.8 kg)  11/01/20 250 lb (113.4 kg)     Health Maintenance Due  Topic Date Due   COVID-19 Vaccine (2 - Booster for Janssen series) 06/21/2019    There are no preventive care reminders to display for this patient.  Lab Results  Component Value Date   TSH 0.55 07/09/2020   Lab Results  Component Value Date   WBC 17.1 (H) 10/20/2020   HGB 12.6  10/20/2020   HCT 39.7 10/20/2020   MCV 79.5 10/20/2020   PLT 192.0 10/20/2020   Lab Results  Component Value Date   NA 134 (L) 10/20/2020   K 4.3 10/20/2020   CO2 26 10/20/2020   GLUCOSE 152 (H) 10/20/2020   BUN 17 10/20/2020   CREATININE 0.60 10/20/2020   BILITOT 1.0 10/20/2020   ALKPHOS 94 10/20/2020   AST 27 10/20/2020   ALT 41 (H) 10/20/2020   PROT 7.0 10/20/2020   ALBUMIN 3.8 10/20/2020   CALCIUM 9.0 10/20/2020   ANIONGAP 8 10/08/2020   GFR 105.79 10/20/2020   Lab Results  Component Value Date   CHOL 158 01/28/2020   Lab Results  Component Value Date   HDL 43 01/28/2020   Lab Results  Component Value Date   LDLCALC 97 01/28/2020   Lab Results  Component Value Date   TRIG 100 01/28/2020   Lab Results  Component Value Date   CHOLHDL 3.7 01/28/2020   Lab Results  Component Value Date   HGBA1C 8.1 (A) 10/15/2020      Assessment & Plan:   Problem List Items Addressed This Visit   None Visit Diagnoses     Abnormal ultrasound of lower extremity    -  Primary   Relevant Medications   furosemide (LASIX) 20 MG tablet   potassium chloride (KLOR-CON 10) 10 MEQ tablet   Other Relevant Orders   MR FEMUR RIGHT W WO CONTRAST       Meds ordered this encounter  Medications   furosemide (  LASIX) 20 MG tablet    Sig: Take 1 tablet (20 mg total) by mouth 2 (two) times daily.    Dispense:  60 tablet    Refill:  0    Order Specific Question:   Supervising Provider    Answer:   Carlota Raspberry, JEFFREY R [[id number]]   potassium chloride (KLOR-CON 10) 10 MEQ tablet    Sig: Take 1 tablet (10 mEq total) by mouth daily.    Dispense:  30 tablet    Refill:  0    Order Specific Question:   Supervising Provider    Answer:   Carlota Raspberry, JEFFREY R [[id number]]    Follow-up: Return if symptoms worsen or fail to improve.   PLAN Pt neurovascularly intact in R foot. Suspect that she is favoring this due to L foot injury leading to edema. Will double lasix for one week and suggest  compression stockings. Reviewed nonpharm including elevation, heat, ice, rest.  Will contact myself or Dr. Carlota Raspberry by Friday if worsening or failing to improve Order MRI to characterize thigh lesion. Patient encouraged to call clinic with any questions, comments, or concerns.  Maximiano Coss, NP

## 2020-11-16 NOTE — Patient Instructions (Addendum)
Ms. Raphael -  Doristine Devoid to see you  Double up lasix for now. Add potassium supplement. If no improvement in 1 week let me know.  Have ordered MRI thigh. I'll let you know how results look.  Thank you  Rich    If you have lab work done today you will be contacted with your lab results within the next 2 weeks.  If you have not heard from [place] then please contact us. The fastest way to get your results is to register for My Chart.   IF you received an x-ray today, you will receive an invoice from [facility]. Please contact [facility] at [phone] with questions or concerns regarding your invoice.   IF you received labwork today, you will receive an invoice from [facility]. Please contact [facility] at [phone] with questions or concerns regarding your invoice.   Our billing staff will not be able to assist you with questions regarding bills from these companies.  You will be contacted with the lab results as soon as they are available. The fastest way to get your results is to activate your My Chart account. Instructions are located on the last page of this paperwork. If you have not heard from [place] regarding the results in 2 weeks, please contact this office.

## 2020-11-22 ENCOUNTER — Encounter: Payer: Self-pay | Admitting: Registered Nurse

## 2020-11-22 ENCOUNTER — Other Ambulatory Visit: Payer: Self-pay | Admitting: Registered Nurse

## 2020-11-22 DIAGNOSIS — M7989 Other specified soft tissue disorders: Secondary | ICD-10-CM

## 2020-11-22 DIAGNOSIS — R936 Abnormal findings on diagnostic imaging of limbs: Secondary | ICD-10-CM

## 2020-11-22 NOTE — Telephone Encounter (Signed)
Will refer to vascular, who should call her in the next few days  Thanks,  Denice Paradise

## 2020-11-30 ENCOUNTER — Ambulatory Visit
Admission: RE | Admit: 2020-11-30 | Discharge: 2020-11-30 | Disposition: A | Discharge status: Home / Self Care | Payer: Commercial Managed Care - PPO | Source: Ambulatory Visit | Attending: Registered Nurse | Admitting: Registered Nurse

## 2020-11-30 DIAGNOSIS — R936 Abnormal findings on diagnostic imaging of limbs: Secondary | ICD-10-CM

## 2020-11-30 MED ORDER — GADOBENATE DIMEGLUMINE 529 MG/ML IV SOLN
20.0000 mL | Freq: Once | INTRAVENOUS | Status: AC | PRN
  Administered 2020-11-30: 20 mL via INTRAVENOUS

## 2020-12-01 ENCOUNTER — Other Ambulatory Visit: Payer: Self-pay | Admitting: Registered Nurse

## 2020-12-01 ENCOUNTER — Other Ambulatory Visit: Payer: Self-pay

## 2020-12-01 ENCOUNTER — Encounter: Payer: Self-pay | Admitting: Registered Nurse

## 2020-12-01 DIAGNOSIS — M7989 Other specified soft tissue disorders: Secondary | ICD-10-CM

## 2020-12-01 DIAGNOSIS — D1720 Benign lipomatous neoplasm of skin and subcutaneous tissue of unspecified limb: Secondary | ICD-10-CM

## 2020-12-01 NOTE — Telephone Encounter (Signed)
Patient would like to know how big the mass in her leg is. Patient is wondering is this what is causing her more pain. Please advise if the patient needs and visit to discuss further.

## 2020-12-02 ENCOUNTER — Other Ambulatory Visit: Payer: Self-pay

## 2020-12-02 ENCOUNTER — Encounter: Payer: Self-pay | Admitting: Family Medicine

## 2020-12-02 ENCOUNTER — Ambulatory Visit (INDEPENDENT_AMBULATORY_CARE_PROVIDER_SITE_OTHER): Payer: Commercial Managed Care - PPO | Admitting: Family Medicine

## 2020-12-02 VITALS — BP 126/74 | HR 78 | Temp 98.3°F | Resp 16 | Ht 69.0 in | Wt 251.0 lb

## 2020-12-02 DIAGNOSIS — F5104 Psychophysiologic insomnia: Secondary | ICD-10-CM

## 2020-12-02 DIAGNOSIS — Z23 Encounter for immunization: Secondary | ICD-10-CM | POA: Diagnosis not present

## 2020-12-02 DIAGNOSIS — F411 Generalized anxiety disorder: Secondary | ICD-10-CM

## 2020-12-02 MED ORDER — DULOXETINE HCL 60 MG PO CPEP
60.0000 mg | ORAL_CAPSULE | Freq: Every day | ORAL | 1 refills | Status: DC
Start: 2020-12-02 — End: 2021-04-06

## 2020-12-02 MED ORDER — HYDROXYZINE HCL 10 MG PO TABS: 10.0000 mg | ORAL_TABLET | Freq: Three times a day (TID) | ORAL | 1 refills | Status: DC | PRN

## 2020-12-02 NOTE — Patient Instructions (Addendum)
Glad to hear that anxiety is better. Send me an update or call in next 3-4 weeks and we have option of 26m total dose if needed at that time.  1-2 hydroxyzine if needed for anxiety or sleep.  Twinrix (hepatitis A and B vaccine given today).  Thanks for coming in today.

## 2020-12-02 NOTE — Progress Notes (Signed)
Subjective:  Patient ID: Teresa Castaneda, female    DOB: 11-03-1970  Age: 50 y.o. MRN: [id number]  CC:  Chief Complaint  Patient presents with   Anxiety    Pt reports meds have helped with anxiety but not sleep at this time    Insomnia    Reports sleep is not much better the hydroxyzine does not make her sleepy     HPI Teresa Castaneda presents for  Anxiety with insomnia Follow-up from October 3.  Minimal change in symptoms on her Lexapro and higher dose of Lexapro and opted for change in medication.  She was unable to pursue counseling at that time.  Anxiety thought to be due to in part due to adjustment with recurrent health issues.  However with pain/chronic pain Cymbalta was started for dual purpose.  Cross tapered with Lexapro for initial week with 30 mg dosing then plan for 60 mg daily.  Hydroxyzine if needed for sleep.  Tolerating current dose of Cymbalta 60 with some improvement in anxiety.  Working better than lexapro. Still with some difficulty with sleep. No n/v or new side effects.  Tried hydroxyzine 10 mg - helps with anxiety flairs. Not sedated on this dose. Once this week.  Has tried at bedtime - no relief of insomnia.    Due for hep B #3.  Immunization History  Administered Date(s) Administered   Hep A / Hep B 06/09/2020   Hepb-cpg 07/09/2020   Janssen (J&J) SARS-COV-2 Vaccination 04/26/2019   Pneumococcal Polysaccharide-23 06/09/2020   Td 02/12/2020     Depression screen PHQ 2/9 12/02/2020 10/18/2020 07/09/2020 06/09/2020 03/10/2020  Decreased Interest 1 2 0 1 0  Down, Depressed, Hopeless 1 1 1 1  0  PHQ - 2 Score 2 3 1 2  0  Altered sleeping 2 3 2 3  -  Tired, decreased energy 1 3 2 3  -  Change in appetite 1 2 1 1  -  Feeling bad or failure about yourself  0 1 0 0 -  Trouble concentrating 1 1 1 1  -  Moving slowly or fidgety/restless 0 2 0 1 -  Suicidal thoughts 0 0 0 0 -  PHQ-9 Score 7 15 7 11  -   GAD 7 : Generalized Anxiety Score 10/18/2020 07/09/2020  06/09/2020 03/25/2020  Nervous, Anxious, on Edge 1 1 1 1   Control/stop worrying 0 0 1 3  Worry too much - different things 1 1 2 2   Trouble relaxing 2 2 2  0  Restless 1 1 1 2   Easily annoyed or irritable 1 1 3 1   Afraid - awful might happen 1 0 1 1  Total GAD 7 Score 7 6 11 10       History Patient Active Problem List   Diagnosis Date Noted   Cirrhosis of liver ([place]) 06/23/2020   Symptomatic cholelithiasis 05/10/2020   Acquired hypothyroidism 04/05/2020   Weight gain 04/05/2020   Type 2 diabetes mellitus with hyperglycemia, without long-term current use of insulin ([place]) 04/05/2020   History of COVID-19 09/23/2019   Pneumonia due to COVID-19 virus 09/23/2019   Cough 09/23/2019   BMI 40.0-44.9, adult ([place]) 07/22/2019   Facet arthropathy, lumbar 07/22/2019   Sacroiliitis ([place]) 07/22/2019   ANXIETY 09/10/2006   DEPRESSION 09/10/2006   ASTHMA 09/10/2006   HEADACHE 09/10/2006   Past Medical History:  Diagnosis Date   Allergies    Anxiety    Arthritis    Asthma    Back pain    Spinal Issue  Diabetes mellitus without complication (HCC)    DJD (degenerative joint disease), lumbar    Elevated cholesterol    Gallstone    GERD (gastroesophageal reflux disease)    Liver cirrhosis secondary to NASH ([place])    Neuromuscular disorder ([place])    Obesity    Oxygen deficiency    Thyroid disease    Past Surgical History:  Procedure Laterality Date   ABDOMINAL HYSTERECTOMY  02/21/2003   CHOLECYSTECTOMY N/A 05/10/2020   Procedure: LAPAROSCOPIC CHOLECYSTECTOMY WITH INTRAOPERATIVE CHOLANGIOGRAM AND LIVER BIOPSY;  Surgeon: Felicie Morn, MD;  Location: [facility];  Service: General;  Laterality: N/A;   ENDOSCOPIC TURBINATE REDUCTION Bilateral 10/13/2020   Procedure: ENDOSCOPIC BILATERAL INFERIOR TURBINATE REDUCTIONS;  Surgeon: Rozetta Nunnery, MD;  Location: [facility];  Service: ENT;  Laterality: Bilateral;   ETHMOIDECTOMY Bilateral 10/13/2020   Procedure:  BILATERAL TOTAL ETHMOIDECTOMY AND MAXILLARY OSTIA ENLARGEMENTS;  Surgeon: Rozetta Nunnery, MD;  Location: [facility];  Service: ENT;  Laterality: Bilateral;   LIVER BIOPSY     NASAL SINUS SURGERY Bilateral 10/13/2020   Procedure: SINUS ENDOSCOPY WITH STEALTH NAVIGATION;  Surgeon: Rozetta Nunnery, MD;  Location: [facility];  Service: ENT;  Laterality: Bilateral;   TONSILLECTOMY     Allergies  Allergen Reactions   Clarithromycin Other (See Comments)    Body cramps    Codeine Nausea And Vomiting   Tylenol [Acetaminophen] Other (See Comments)    Can not take Tylenol products due to liver   Prior to Admission medications   Medication Sig Start Date End Date Taking? Authorizing Provider  albuterol (VENTOLIN HFA) 108 (90 Base) MCG/ACT inhaler Inhale 2 puffs into the lungs every 6 (six) hours as needed. For shortness of breath and wheezing 11/04/20  Yes Freddi Starr, MD  ARMOUR THYROID 180 MG tablet Take 1 tablet (180 mg total) by mouth daily. 10/15/20  Yes Shamleffer, Melanie Crazier, MD  atorvastatin (LIPITOR) 10 MG tablet Take 1 tablet (10 mg total) by mouth daily. 10/18/20  Yes Wendie Agreste, MD  DULoxetine (CYMBALTA) 30 MG capsule Take 1 capsule (30 mg total) by mouth daily. Start once per day and increase to 2 po qd after 1 week if tolerated. 10/18/20  Yes Wendie Agreste, MD  Fluticasone-Umeclidin-Vilant (TRELEGY ELLIPTA) 100-62.5-25 MCG/ACT AEPB Inhale 1 puff into the lungs daily. 11/04/20  Yes Freddi Starr, MD  furosemide (LASIX) 20 MG tablet Take 1 tablet (20 mg total) by mouth 2 (two) times daily. 11/16/20  Yes Maximiano Coss, NP  gabapentin (NEURONTIN) 100 MG capsule Take 1 capsule (100 mg total) by mouth 3 (three) times daily. 1 po Qam, 2 po Qpm. Patient taking differently: Take 300 mg by mouth 3 (three) times daily. 1 po Qam, 2 po Qpm. 08/12/20  Yes Wendie Agreste, MD  hydrOXYzine (ATARAX/VISTARIL) 10 MG tablet Take 1 tablet (10  mg total) by mouth 3 (three) times daily as needed for anxiety (or sleep.). 10/18/20  Yes Wendie Agreste, MD  ibuprofen (ADVIL) 600 MG tablet Take 1 tablet (600 mg total) by mouth in the morning and at bedtime. 11/01/20  Yes Draper, Carlos Levering, DO  levocetirizine (XYZAL) 5 MG tablet Take 5 mg by mouth every evening.   Yes [provider]  lidocaine (LIDODERM) 5 % USE 1 PATCH EXTERNALLY ONCE DAILY REMOVE  AND  DISCARD  PATCH  WITH  12  HOURS  OR  AS  DIRECTED  BY  MD 08/11/20  Yes  Wendie Agreste, MD  metFORMIN (GLUCOPHAGE) 850 MG tablet Take 1 tablet (850 mg total) by mouth 2 (two) times daily with a meal. 10/18/20  Yes Wendie Agreste, MD  mometasone (NASONEX) 50 MCG/ACT nasal spray Place 1 spray into the nose daily. 1 spray per nostril daily   Yes [provider]  omeprazole (PRILOSEC) 40 MG capsule Take 1 capsule (40 mg total) by mouth daily. 10/20/20  Yes Jackquline Denmark, MD  potassium chloride (KLOR-CON 10) 10 MEQ tablet Take 1 tablet (10 mEq total) by mouth daily. 11/16/20  Yes Maximiano Coss, NP  Doreene Nest 550 MG TABS tablet Take 1 tablet by mouth twice daily 11/08/20  Yes Jackquline Denmark, MD  azelastine (ASTELIN) 0.1 % nasal spray Place 2 sprays into both nostrils 2 (two) times daily. 04/23/19 12/26/19  Ok Edwards, PA-C   Social History   Socioeconomic History   Marital status: Single    Spouse name: Not on file   Number of children: Not on file   Years of education: Not on file   Highest education level: Not on file  Occupational History   Not on file  Tobacco Use   Smoking status: Former    Packs/day: 0.50    Types: Cigarettes    Start date: 01/16/1985    Quit date: 10/08/2019    Years since quitting: 1.1   Smokeless tobacco: Never  Vaping Use   Vaping Use: Never used  Substance and Sexual Activity   Alcohol use: No   Drug use: No   Sexual activity: Not on file  Other Topics Concern   Not on file  Social History Narrative   Not on file   Social Determinants  of Health   Financial Resource Strain: Not on file  Food Insecurity: Not on file  Transportation Needs: Not on file  Physical Activity: Not on file  Stress: Not on file  Social Connections: Not on file  Intimate Partner Violence: Not on file    Review of Systems Per HPI.   Objective:   Vitals:   12/02/20 1045  BP: 126/74  Pulse: 78  Resp: 16  Temp: 98.3 F (36.8 C)  TempSrc: Temporal  SpO2: 97%  Weight: 251 lb (113.9 kg)  Height: 5' 9"  (1.753 m)     Physical Exam Constitutional:      General: She is not in acute distress.    Appearance: Normal appearance. She is well-developed.  HENT:     Head: Normocephalic and atraumatic.  Cardiovascular:     Rate and Rhythm: Normal rate.  Pulmonary:     Effort: Pulmonary effort is normal.  Neurological:     Mental Status: She is alert and oriented to person, place, and time.  Psychiatric:        Mood and Affect: Mood normal.    Assessment & Plan:  Teresa Castaneda is a 50 y.o. female . Psychophysiological insomnia - Plan: hydrOXYzine (ATARAX/VISTARIL) 10 MG tablet  Anxiety state - Plan: hydrOXYzine (ATARAX/VISTARIL) 10 MG tablet, DULoxetine (CYMBALTA) 60 MG capsule  Need for hepatitis vaccination - Plan: Hepatitis A hepatitis B combined vaccine IM  Anxiety improved on Cymbalta.  Continue 60 mg dose for now with update next few weeks as option to increase to 90 mg.  Continue hydroxyzine as needed for breakthrough anxiety, higher dosing discussed for sleep if needed, 10 to 20 mg initially but option of 30 mg if needed at bedtime.  Potential side effects discussed.  69-monthfollow-up if doing well.  Hepatitis A and B vaccines given to complete series.  Meds ordered this encounter  Medications   hydrOXYzine (ATARAX/VISTARIL) 10 MG tablet    Sig: Take 1-2 tablets (10-20 mg total) by mouth 3 (three) times daily as needed for anxiety (or sleep.).    Dispense:  90 tablet    Refill:  1   DULoxetine (CYMBALTA) 60 MG capsule     Sig: Take 1 capsule (60 mg total) by mouth daily. Start once per day and increase to 2 po qd after 1 week if tolerated.    Dispense:  90 capsule    Refill:  1   Patient Instructions  Glad to hear that anxiety is better. Send me an update or call in next 3-4 weeks and we have option of 54m total dose if needed at that time.  1-2 hydroxyzine if needed for anxiety or sleep.  Twinrix (hepatitis A and B vaccine given today).  Thanks for coming in today.     Signed,   JMerri Ray MD [facility] 12/02/20 12:05 PM

## 2020-12-03 ENCOUNTER — Other Ambulatory Visit: Payer: Commercial Managed Care - PPO

## 2020-12-03 ENCOUNTER — Ambulatory Visit
Admission: RE | Admit: 2020-12-03 | Discharge: 2020-12-03 | Disposition: A | Discharge status: Home / Self Care | Payer: Commercial Managed Care - PPO | Source: Ambulatory Visit | Attending: Orthopedic Surgery | Admitting: Orthopedic Surgery

## 2020-12-03 DIAGNOSIS — M5459 Other low back pain: Secondary | ICD-10-CM

## 2020-12-05 NOTE — Progress Notes (Signed)
Cardiology Office Note   Date:  12/06/2020   ID:  Teresa Castaneda, DOB 11-06-1970, MRN [id number]  PCP:  Wendie Agreste, MD    No chief complaint on file.  Family h/o CAD  Wt Readings from Last 3 Encounters:  12/06/20 255 lb 12.8 oz (116 kg)  12/02/20 251 lb (113.9 kg)  11/16/20 251 lb 6.4 oz (114 kg)       History of Present Illness: Teresa Castaneda is a 50 y.o. female   who I first saw in Nov 2021 for the evaluation of DOE at the request of Freddi Starr, MD.   She has a h/o asthma since age 3.  She is managed with inhalers.  She got COVID in 8/21 but managed at home.  No antibody treatment or hospital stay.   In 2021, she continued to have DOE, rare palpitations- lasting seconds, and shortlived sharp chest pains.  Walking limited by back pain.  Some walking at work.  No chest pain with walking at work.   Quit smoking since COVID in 8/21.   Heart disease in both parents, mother with stents, DM, COPD- died at 34, father in his early 6s.   Echo 10/21: "Left ventricular ejection fraction, by estimation, is 65 to 70%. The  left ventricle has normal function. The left ventricle has no regional  wall motion abnormalities. There is mild concentric left ventricular  hypertrophy. Left ventricular diastolic  parameters are consistent with Grade I diastolic dysfunction (impaired  relaxation). Elevated left atrial pressure.   2. Right ventricular systolic function is normal. The right ventricular  size is normal. There is normal pulmonary artery systolic pressure. The  estimated right ventricular systolic pressure is 67.8 mmHg.   3. Left atrial size was mildly dilated.   4. The mitral valve is normal in structure. Mild mitral valve  regurgitation. No evidence of mitral stenosis.   5. The aortic valve is normal in structure. Aortic valve regurgitation is  not visualized. No aortic stenosis is present.   6. The inferior vena cava is normal in size with greater than  50%  respiratory variability, suggesting right atrial pressure of 3 mmHg. "   Broke left foot.    Right leg swelling over the past few weeks.  Past Medical History:  Diagnosis Date   Allergies    Anxiety    Arthritis    Asthma    Back pain    Spinal Issue    Diabetes mellitus without complication (HCC)    DJD (degenerative joint disease), lumbar    Elevated cholesterol    Gallstone    GERD (gastroesophageal reflux disease)    Liver cirrhosis secondary to NASH ([place])    Neuromuscular disorder ([place])    Obesity    Oxygen deficiency    Thyroid disease     Past Surgical History:  Procedure Laterality Date   ABDOMINAL HYSTERECTOMY  02/21/2003   CHOLECYSTECTOMY N/A 05/10/2020   Procedure: LAPAROSCOPIC CHOLECYSTECTOMY WITH INTRAOPERATIVE CHOLANGIOGRAM AND LIVER BIOPSY;  Surgeon: Felicie Morn, MD;  [facility];  Service: General;  Laterality: N/A;   ENDOSCOPIC TURBINATE REDUCTION Bilateral 10/13/2020   Procedure: ENDOSCOPIC BILATERAL INFERIOR TURBINATE REDUCTIONS;  Surgeon: Rozetta Nunnery, MD;  Location: [facility];  Service: ENT;  Laterality: Bilateral;   ETHMOIDECTOMY Bilateral 10/13/2020   Procedure: BILATERAL TOTAL ETHMOIDECTOMY AND MAXILLARY OSTIA ENLARGEMENTS;  Surgeon: Rozetta Nunnery, MD;  Location: [facility];  Service: ENT;  Laterality: Bilateral;  LIVER BIOPSY     NASAL SINUS SURGERY Bilateral 10/13/2020   Procedure: SINUS ENDOSCOPY WITH STEALTH NAVIGATION;  Surgeon: Rozetta Nunnery, MD;  Location: [facility];  Service: ENT;  Laterality: Bilateral;   TONSILLECTOMY       Current Outpatient Medications  Medication Sig Dispense Refill   albuterol (VENTOLIN HFA) 108 (90 Base) MCG/ACT inhaler Inhale 2 puffs into the lungs every 6 (six) hours as needed. For shortness of breath and wheezing 18 g 3   ARMOUR THYROID 180 MG tablet Take 1 tablet (180 mg total) by mouth daily. 90 tablet 3   atorvastatin  (LIPITOR) 10 MG tablet Take 1 tablet (10 mg total) by mouth daily. 90 tablet 3   DULoxetine (CYMBALTA) 60 MG capsule Take 1 capsule (60 mg total) by mouth daily. Start once per day and increase to 2 po qd after 1 week if tolerated. 90 capsule 1   FARXIGA 10 MG TABS tablet Take 10 mg by mouth daily.     Fluticasone-Umeclidin-Vilant (TRELEGY ELLIPTA) 100-62.5-25 MCG/ACT AEPB Inhale 1 puff into the lungs daily. 60 each 5   furosemide (LASIX) 20 MG tablet Take 1 tablet (20 mg total) by mouth 2 (two) times daily. 60 tablet 0   gabapentin (NEURONTIN) 100 MG capsule Take 1 capsule (100 mg total) by mouth 3 (three) times daily. 1 po Qam, 2 po Qpm. (Patient taking differently: Take 300 mg by mouth 2 (two) times daily. 1 po Qam, 2 po Qpm.) 90 capsule 1   hydrOXYzine (ATARAX/VISTARIL) 10 MG tablet Take 1-2 tablets (10-20 mg total) by mouth 3 (three) times daily as needed for anxiety (or sleep.). 90 tablet 1   ibuprofen (ADVIL) 600 MG tablet Take 1 tablet (600 mg total) by mouth in the morning and at bedtime. 30 tablet 0   levocetirizine (XYZAL) 5 MG tablet Take 5 mg by mouth every evening.     lidocaine (LIDODERM) 5 % USE 1 PATCH EXTERNALLY ONCE DAILY REMOVE  AND  DISCARD  PATCH  WITH  12  HOURS  OR  AS  DIRECTED  BY  MD 30 patch 0   metFORMIN (GLUCOPHAGE) 850 MG tablet Take 1 tablet (850 mg total) by mouth 2 (two) times daily with a meal. 60 tablet 3   mometasone (NASONEX) 50 MCG/ACT nasal spray Place 1 spray into the nose daily. 1 spray per nostril daily     omeprazole (PRILOSEC) 40 MG capsule Take 1 capsule (40 mg total) by mouth daily. 90 capsule 3   potassium chloride (KLOR-CON 10) 10 MEQ tablet Take 1 tablet (10 mEq total) by mouth daily. 30 tablet 0   XIFAXAN 550 MG TABS tablet Take 1 tablet by mouth twice daily 60 tablet 12   No current facility-administered medications for this visit.    Allergies:   Clarithromycin, Codeine, and Tylenol [acetaminophen]    Social History:  The patient  reports  that she quit smoking about 13 months ago. Her smoking use included cigarettes. She started smoking about 35 years ago. She smoked an average of .5 packs per day. She has never used smokeless tobacco. She reports that she does not drink alcohol and does not use drugs.   Family History:  The patient's family history includes COPD in her mother; Coronary artery disease in her father and mother; Diabetes in her mother; Hypertension in her father.    ROS:  Please see the history of present illness.   Otherwise, review of systems are positive for right  leg swelling.   All other systems are reviewed and negative.    PHYSICAL EXAM: VS:  BP 120/60   Pulse 100   Ht 5' 9"  (1.753 m)   Wt 255 lb 12.8 oz (116 kg)   SpO2 95%   BMI 37.78 kg/m  , BMI Body mass index is 37.78 kg/m. GEN: Well nourished, well developed, in no acute distress HEENT: normal Neck: no JVD, carotid bruits, or masses Cardiac: RRR; no murmurs, rubs, or gallops,Left leg boot; right leg edema 2+ pitting Respiratory:  clear to auscultation bilaterally, normal work of breathing GI: soft, nontender, nondistended, + BS MS: no deformity or atrophy Skin: warm and dry, no rash Neuro:  Strength and sensation are intact Psych: euthymic mood, full affect   EKG:   The ekg ordered 04/2020 demonstrates NSR, no ST changes   Recent Labs: 07/09/2020: TSH 0.55 10/20/2020: ALT 41; BUN 17; Creatinine, Ser 0.60; Hemoglobin 12.6; Platelets 192.0; Potassium 4.3; Sodium 134   Lipid Panel    Component Value Date/Time   CHOL 158 01/28/2020 0826   TRIG 100 01/28/2020 0826   HDL 43 01/28/2020 0826   CHOLHDL 3.7 01/28/2020 0826   LDLCALC 97 01/28/2020 0826     Other studies Reviewed: Additional studies/ records that were reviewed today with results demonstrating: lab reviewed.   ASSESSMENT AND PLAN:  Family h/o early CAD: RF modification.  LE edema: new on the right side.  No DVT per prior ultrasound.  Has appt with [facility]. Now with mass in  the right thigh that may be obstructing flow.  I do not think the swelling is coming from heart failure Hyperlipideima: LDL 97 in Jan 2022.  Continue atorvastatin.  Elevated LFTs: ALT 41 in 2022.  Morbid obesity: WHole food, plant based diet.  Avoid processed foods.  High-fiber intake. Type 2 DM: A1C 8.1 , down from 9.9. Follows with endocrine.   Current medicines are reviewed at length with the patient today.  The patient concerns regarding her medicines were addressed.  The following changes have been made:  No change  Labs/ tests ordered today include:  No orders of the defined types were placed in this encounter.   Recommend 150 minutes/week of aerobic exercise Low fat, low carb, high fiber diet recommended  Disposition:   FU in 1 year   Signed, Larae Grooms, MD  12/06/2020 10:32 AM    [facility] [address], [place], [place]  [address] Phone: [phone]; Fax: [phone]

## 2020-12-06 ENCOUNTER — Ambulatory Visit (INDEPENDENT_AMBULATORY_CARE_PROVIDER_SITE_OTHER): Payer: Commercial Managed Care - PPO | Admitting: Interventional Cardiology

## 2020-12-06 ENCOUNTER — Encounter: Payer: Self-pay | Admitting: Interventional Cardiology

## 2020-12-06 ENCOUNTER — Other Ambulatory Visit: Payer: Self-pay

## 2020-12-06 VITALS — BP 120/60 | HR 100 | Ht 69.0 in | Wt 255.8 lb

## 2020-12-06 DIAGNOSIS — Z8249 Family history of ischemic heart disease and other diseases of the circulatory system: Secondary | ICD-10-CM | POA: Diagnosis not present

## 2020-12-06 DIAGNOSIS — E119 Type 2 diabetes mellitus without complications: Secondary | ICD-10-CM | POA: Diagnosis not present

## 2020-12-06 DIAGNOSIS — E785 Hyperlipidemia, unspecified: Secondary | ICD-10-CM

## 2020-12-06 DIAGNOSIS — R748 Abnormal levels of other serum enzymes: Secondary | ICD-10-CM

## 2020-12-06 NOTE — Patient Instructions (Signed)
Medication Instructions:  Your physician recommends that you continue on your current medications as directed. Please refer to the Current Medication list given to you today.  *If you need a refill on your cardiac medications before your next appointment, please call your pharmacy*   Lab Work: none If you have labs (blood work) drawn today and your tests are completely normal, you will receive your results only by: [place] (if you have MyChart) OR A paper copy in the mail If you have any lab test that is abnormal or we need to change your treatment, we will call you to review the results.   Testing/Procedures: none   Follow-Up: At [facility], you and your health needs are our priority.  As part of our continuing mission to provide you with exceptional heart care, we have created designated Provider Care Teams.  These Care Teams include your primary Cardiologist (physician) and Advanced Practice Providers (APPs -  Physician Assistants and Nurse Practitioners) who all work together to provide you with the care you need, when you need it.  We recommend signing up for the patient portal called "MyChart".  Sign up information is provided on this After Visit Summary.  MyChart is used to connect with patients for Virtual Visits (Telemedicine).  Patients are able to view lab/test results, encounter notes, upcoming appointments, etc.  Non-urgent messages can be sent to your provider as well.   To learn more about what you can do with MyChart, go to [email or web address].    Your next appointment:   12 month(s)  The format for your next appointment:   In Person  Provider:   Larae Grooms, MD     Other Instructions  High-Fiber Eating Plan Fiber, also called dietary fiber, is a type of carbohydrate. It is found foods such as fruits, vegetables, whole grains, and beans. A high-fiber diet can have many health benefits. Your health care provider may recommend a high-fiber diet  to help: Prevent constipation. Fiber can make your bowel movements more regular. Lower your cholesterol. Relieve the following conditions: Inflammation of veins in the anus (hemorrhoids). Inflammation of specific areas of the digestive tract (uncomplicated diverticulosis). A problem of the large intestine, also called the colon, that sometimes causes pain and diarrhea (irritable bowel syndrome, or IBS). Prevent overeating as part of a weight-loss plan. Prevent heart disease, type 2 diabetes, and certain cancers. What are tips for following this plan? Reading food labels  Check the nutrition facts label on food products for the amount of dietary fiber. Choose foods that have 5 grams of fiber or more per serving. The goals for recommended daily fiber intake include: Men (age 18 or younger): 34-38 g. Men (over age 67): 28-34 g. Women (age 56 or younger): 25-28 g. Women (over age 34): 22-25 g. Your daily fiber goal is _____________ g. Shopping Choose whole fruits and vegetables instead of processed forms, such as apple juice or applesauce. Choose a wide variety of high-fiber foods such as avocados, lentils, oats, and kidney beans. Read the nutrition facts label of the foods you choose. Be aware of foods with added fiber. These foods often have high sugar and sodium amounts per serving. Cooking Use whole-grain flour for baking and cooking. Cook with brown rice instead of white rice. Meal planning Start the day with a breakfast that is high in fiber, such as a cereal that contains 5 g of fiber or more per serving. Eat breads and cereals that are made with whole-grain flour instead of  refined flour or white flour. Eat brown rice, bulgur wheat, or millet instead of white rice. Use beans in place of meat in soups, salads, and pasta dishes. Be sure that half of the grains you eat each day are whole grains. General information You can get the recommended daily intake of dietary fiber  by: Eating a variety of fruits, vegetables, grains, nuts, and beans. Taking a fiber supplement if you are not able to take in enough fiber in your diet. It is better to get fiber through food than from a supplement. Gradually increase how much fiber you consume. If you increase your intake of dietary fiber too quickly, you may have bloating, cramping, or gas. Drink plenty of water to help you digest fiber. Choose high-fiber snacks, such as berries, raw vegetables, nuts, and popcorn. What foods should I eat? Fruits Berries. Pears. Apples. Oranges. Avocado. Prunes and raisins. Dried figs. Vegetables Sweet potatoes. Spinach. Kale. Artichokes. Cabbage. Broccoli. Cauliflower. Green peas. Carrots. Squash. Grains Whole-grain breads. Multigrain cereal. Oats and oatmeal. Brown rice. Barley. Bulgur wheat. [place]. Quinoa. Bran muffins. Popcorn. Rye wafer crackers. Meats and other proteins Navy beans, kidney beans, and pinto beans. Soybeans. Split peas. Lentils. Nuts and seeds. Dairy Fiber-fortified yogurt. Beverages Fiber-fortified soy milk. Fiber-fortified orange juice. Other foods Fiber bars. The items listed above may not be a complete list of recommended foods and beverages. Contact a dietitian for more information. What foods should I avoid? Fruits Fruit juice. Cooked, strained fruit. Vegetables Fried potatoes. Canned vegetables. Well-cooked vegetables. Grains White bread. Pasta made with refined flour. White rice. Meats and other proteins Fatty cuts of meat. Fried chicken or fried fish. Dairy Milk. Yogurt. Cream cheese. Sour cream. Fats and oils Butters. Beverages Soft drinks. Other foods Cakes and pastries. The items listed above may not be a complete list of foods and beverages to avoid. Talk with your dietitian about what choices are best for you. Summary Fiber is a type of carbohydrate. It is found in foods such as fruits, vegetables, whole grains, and beans. A high-fiber  diet has many benefits. It can help to prevent constipation, lower blood cholesterol, aid weight loss, and reduce your risk of heart disease, diabetes, and certain cancers. Increase your intake of fiber gradually. Increasing fiber too quickly may cause cramping, bloating, and gas. Drink plenty of water while you increase the amount of fiber you consume. The best sources of fiber include whole fruits and vegetables, whole grains, nuts, seeds, and beans. This information is not intended to replace advice given to you by your health care provider. Make sure you discuss any questions you have with your health care provider. Document Revised: 05/08/2019 Document Reviewed: 05/08/2019 Elsevier Patient Education  2022 [employer].

## 2020-12-12 ENCOUNTER — Other Ambulatory Visit: Payer: Self-pay

## 2020-12-12 ENCOUNTER — Encounter (HOSPITAL_BASED_OUTPATIENT_CLINIC_OR_DEPARTMENT_OTHER): Payer: Self-pay

## 2020-12-12 ENCOUNTER — Emergency Department (HOSPITAL_BASED_OUTPATIENT_CLINIC_OR_DEPARTMENT_OTHER)
Admission: EM | Admit: 2020-12-12 | Discharge: 2020-12-12 | Disposition: A | Discharge status: Home / Self Care | Payer: Commercial Managed Care - PPO | Attending: Emergency Medicine | Admitting: Emergency Medicine

## 2020-12-12 DIAGNOSIS — M7989 Other specified soft tissue disorders: Secondary | ICD-10-CM

## 2020-12-12 DIAGNOSIS — E039 Hypothyroidism, unspecified: Secondary | ICD-10-CM | POA: Diagnosis not present

## 2020-12-12 DIAGNOSIS — Z7951 Long term (current) use of inhaled steroids: Secondary | ICD-10-CM | POA: Diagnosis not present

## 2020-12-12 DIAGNOSIS — E119 Type 2 diabetes mellitus without complications: Secondary | ICD-10-CM | POA: Insufficient documentation

## 2020-12-12 DIAGNOSIS — Z8616 Personal history of COVID-19: Secondary | ICD-10-CM | POA: Diagnosis not present

## 2020-12-12 DIAGNOSIS — R000 Tachycardia, unspecified: Secondary | ICD-10-CM | POA: Diagnosis not present

## 2020-12-12 DIAGNOSIS — Z79899 Other long term (current) drug therapy: Secondary | ICD-10-CM | POA: Diagnosis not present

## 2020-12-12 DIAGNOSIS — R2241 Localized swelling, mass and lump, right lower limb: Secondary | ICD-10-CM | POA: Diagnosis not present

## 2020-12-12 DIAGNOSIS — Z7984 Long term (current) use of oral hypoglycemic drugs: Secondary | ICD-10-CM | POA: Insufficient documentation

## 2020-12-12 DIAGNOSIS — J45909 Unspecified asthma, uncomplicated: Secondary | ICD-10-CM | POA: Insufficient documentation

## 2020-12-12 DIAGNOSIS — Z87891 Personal history of nicotine dependence: Secondary | ICD-10-CM | POA: Diagnosis not present

## 2020-12-12 MED ORDER — TRAMADOL HCL 50 MG PO TABS: 50.0000 mg | ORAL_TABLET | Freq: Four times a day (QID) | ORAL | 0 refills | Status: AC | PRN

## 2020-12-12 NOTE — ED Triage Notes (Signed)
She is here with persistent edema of right foot and lower leg. She states she underwent a recent doppler test "and they said I didn't have a blood clot". She also tells me that "my heart doctor has me on lasix and he said my foot swelling doesn't have anything to do with my heart." She also tells me that she has a known proximal right lipoma which one of her doctors told her "might be pushing on a vein". She is ambulatory and in no distress.

## 2020-12-12 NOTE — Discharge Instructions (Signed)
We have wrapped your leg, if you cannot wear your compression socks please continue wrap your leg, please keep it elevated will not use. I have given you a short course of narcotics please take as prescribed.  This medication can make you drowsy do not consume alcohol or operate heavy machinery when taking this medication.    Please follow-up with general surgery for further evaluation.  Come back to the emergency department if you develop chest pain, shortness of breath, severe abdominal pain, uncontrolled nausea, vomiting, diarrhea.

## 2020-12-12 NOTE — ED Notes (Signed)
Compression ACE bandages applied and demonstrated to patient for her to replicate and use at home until compression socks fit again.

## 2020-12-12 NOTE — ED Provider Notes (Signed)
[facility] EMERGENCY DEPT Provider Note   CSN: [id number] Arrival date & time: 12/12/20  1617     History Chief Complaint  Patient presents with   right foot/leg swelling    Teresa Castaneda is a 50 y.o. female.  HPI  Patient with medical history including anxiety, diabetes, liver cirrhosis secondary to [facility], presents with complaints of right lower leg swelling and foot pain.  Patient states she has been having this issue since October, states that the pain is constant, is worsened when she ambulates, pain is mainly on the top of her foot, she notes that she has been having increased swelling,  swelling typically increases at the end of the day and improves in the morning, she denies  redness around the area, denies  calf pain, she has no chest pain, shortness of breath, denies pleuritic chest pain, has no history of PEs or DVTs, currently not on hormone therapy.  Patient states that she has been evaluated by her primary care doctor as well as her cardiologist who she had imaging done, states she had negative DVT study and an MRI that shows a lipoma.  She is to follow-up with general surgery for further evaluation.  She states that she has been applying compression stockings to their foot, keeping elevated, take her Lasix without much relief, she denies fevers, chills, chest pain, shortness of breath, stomach pain.   After reviewing patient's chart patient a negative DVT study on 29 October, she then had a MRI performed that shows a lipoma in her right upper thigh measuring 2 cm.  Appreciate cardiology note does not feel this is cardiac related feels it is more secondary due to lipoma.  Past Medical History:  Diagnosis Date   Allergies    Anxiety    Arthritis    Asthma    Back pain    Spinal Issue    Diabetes mellitus without complication (HCC)    DJD (degenerative joint disease), lumbar    Elevated cholesterol    Gallstone    GERD (gastroesophageal reflux disease)     Liver cirrhosis secondary to NASH ([place])    Neuromuscular disorder ([place])    Obesity    Oxygen deficiency    Thyroid disease     Patient Active Problem List   Diagnosis Date Noted   Cirrhosis of liver ([place]) 06/23/2020   Symptomatic cholelithiasis 05/10/2020   Acquired hypothyroidism 04/05/2020   Weight gain 04/05/2020   Type 2 diabetes mellitus with hyperglycemia, without long-term current use of insulin ([place]) 04/05/2020   History of COVID-19 09/23/2019   Pneumonia due to COVID-19 virus 09/23/2019   Cough 09/23/2019   BMI 40.0-44.9, adult ([place]) 07/22/2019   Facet arthropathy, lumbar 07/22/2019   Sacroiliitis ([place]) 07/22/2019   ANXIETY 09/10/2006   DEPRESSION 09/10/2006   ASTHMA 09/10/2006   HEADACHE 09/10/2006    Past Surgical History:  Procedure Laterality Date   ABDOMINAL HYSTERECTOMY  02/21/2003   CHOLECYSTECTOMY N/A 05/10/2020   Procedure: LAPAROSCOPIC CHOLECYSTECTOMY WITH INTRAOPERATIVE CHOLANGIOGRAM AND LIVER BIOPSY;  Surgeon: Felicie Morn, MD;  Location: [facility];  Service: General;  Laterality: N/A;   ENDOSCOPIC TURBINATE REDUCTION Bilateral 10/13/2020   Procedure: ENDOSCOPIC BILATERAL INFERIOR TURBINATE REDUCTIONS;  Surgeon: Rozetta Nunnery, MD;  Location: [facility];  Service: ENT;  Laterality: Bilateral;   ETHMOIDECTOMY Bilateral 10/13/2020   Procedure: BILATERAL TOTAL ETHMOIDECTOMY AND MAXILLARY OSTIA ENLARGEMENTS;  Surgeon: Rozetta Nunnery, MD;  Location: [facility];  Service: ENT;  Laterality:  Bilateral;   LIVER BIOPSY     NASAL SINUS SURGERY Bilateral 10/13/2020   Procedure: SINUS ENDOSCOPY WITH STEALTH NAVIGATION;  Surgeon: Rozetta Nunnery, MD;  Location: [facility];  Service: ENT;  Laterality: Bilateral;   TONSILLECTOMY       OB History   No obstetric history on file.     Family History  Problem Relation Age of Onset   Coronary artery disease Mother    Diabetes Mother    COPD Mother     Coronary artery disease Father    Hypertension Father    Colon cancer Neg Hx    Esophageal cancer Neg Hx     Social History   Tobacco Use   Smoking status: Former    Packs/day: 0.50    Types: Cigarettes    Start date: 01/16/1985    Quit date: 10/08/2019    Years since quitting: 1.1   Smokeless tobacco: Never  Vaping Use   Vaping Use: Never used  Substance Use Topics   Alcohol use: No   Drug use: No    Home Medications Prior to Admission medications   Medication Sig Start Date End Date Taking? Authorizing Provider  traMADol (ULTRAM) 50 MG tablet Take 1 tablet (50 mg total) by mouth every 6 (six) hours as needed for up to 2 days. 12/12/20 12/14/20 Yes Marcello Fennel, PA-C  albuterol (VENTOLIN HFA) 108 (90 Base) MCG/ACT inhaler Inhale 2 puffs into the lungs every 6 (six) hours as needed. For shortness of breath and wheezing 11/04/20   Freddi Starr, MD  ARMOUR THYROID 180 MG tablet Take 1 tablet (180 mg total) by mouth daily. 10/15/20   Shamleffer, Melanie Crazier, MD  atorvastatin (LIPITOR) 10 MG tablet Take 1 tablet (10 mg total) by mouth daily. 10/18/20   Wendie Agreste, MD  DULoxetine (CYMBALTA) 60 MG capsule Take 1 capsule (60 mg total) by mouth daily. Start once per day and increase to 2 po qd after 1 week if tolerated. 12/02/20   Wendie Agreste, MD  FARXIGA 10 MG TABS tablet Take 10 mg by mouth daily. 12/04/20   [provider]  Fluticasone-Umeclidin-Vilant (TRELEGY ELLIPTA) 100-62.5-25 MCG/ACT AEPB Inhale 1 puff into the lungs daily. 11/04/20   Freddi Starr, MD  furosemide (LASIX) 20 MG tablet Take 1 tablet (20 mg total) by mouth 2 (two) times daily. 11/16/20   Maximiano Coss, NP  gabapentin (NEURONTIN) 100 MG capsule Take 1 capsule (100 mg total) by mouth 3 (three) times daily. 1 po Qam, 2 po Qpm. Patient taking differently: Take 300 mg by mouth 2 (two) times daily. 1 po Qam, 2 po Qpm. 08/12/20   Wendie Agreste, MD  hydrOXYzine  (ATARAX/VISTARIL) 10 MG tablet Take 1-2 tablets (10-20 mg total) by mouth 3 (three) times daily as needed for anxiety (or sleep.). 12/02/20   Wendie Agreste, MD  ibuprofen (ADVIL) 600 MG tablet Take 1 tablet (600 mg total) by mouth in the morning and at bedtime. 11/01/20   Thurman Coyer, DO  levocetirizine (XYZAL) 5 MG tablet Take 5 mg by mouth every evening.    [provider]  lidocaine (LIDODERM) 5 % USE 1 PATCH EXTERNALLY ONCE DAILY REMOVE  AND  DISCARD  PATCH  WITH  12  HOURS  OR  AS  DIRECTED  BY  MD 08/11/20   Wendie Agreste, MD  metFORMIN (GLUCOPHAGE) 850 MG tablet Take 1 tablet (850 mg total) by mouth 2 (two) times  daily with a meal. 10/18/20   Wendie Agreste, MD  mometasone (NASONEX) 50 MCG/ACT nasal spray Place 1 spray into the nose daily. 1 spray per nostril daily    [provider]  omeprazole (PRILOSEC) 40 MG capsule Take 1 capsule (40 mg total) by mouth daily. 10/20/20   Jackquline Denmark, MD  potassium chloride (KLOR-CON 10) 10 MEQ tablet Take 1 tablet (10 mEq total) by mouth daily. 11/16/20   Maximiano Coss, NP  Doreene Nest 550 MG TABS tablet Take 1 tablet by mouth twice daily 11/08/20   Jackquline Denmark, MD  azelastine (ASTELIN) 0.1 % nasal spray Place 2 sprays into both nostrils 2 (two) times daily. 04/23/19 12/26/19  Ok Edwards, PA-C    Allergies    Clarithromycin, Codeine, and Tylenol [acetaminophen]  Review of Systems   Review of Systems  Constitutional:  Negative for chills and fever.  HENT:  Negative for congestion.   Respiratory:  Negative for shortness of breath.   Cardiovascular:  Positive for leg swelling. Negative for chest pain.  Gastrointestinal:  Negative for abdominal pain, nausea and vomiting.  Genitourinary:  Negative for enuresis.  Musculoskeletal:  Negative for back pain.       Right-sided leg pain.  Skin:  Negative for rash.  Neurological:  Negative for dizziness.  Hematological:  Does not bruise/bleed easily.   Physical Exam Updated  Vital Signs BP (!) 153/79   Pulse (!) 108   Temp 98.2 F (36.8 C)   Resp 16   SpO2 93%   Physical Exam Vitals and nursing note reviewed.  Constitutional:      General: She is not in acute distress.    Appearance: She is not ill-appearing.  HENT:     Head: Normocephalic and atraumatic.     Nose: No congestion.  Eyes:     Conjunctiva/sclera: Conjunctivae normal.  Cardiovascular:     Rate and Rhythm: Regular rhythm. Tachycardia present.     Pulses: Normal pulses.     Heart sounds: No murmur heard.   No friction rub. No gallop.  Pulmonary:     Effort: No respiratory distress.     Breath sounds: No wheezing, rhonchi or rales.  Musculoskeletal:        General: Swelling and tenderness present.     Right lower leg: Edema present.     Left lower leg: No edema.     Comments: Right leg has noted 2+ pitting edema up to mid shin, there is no overlying skin changes, full range of motion of toes ankle and knee, neurovascular fully intact.  Patient slight tenderness on the dorsum of the right foot, calf was palpated there is no tenderness on my exam, no palpable cords present.  Skin:    General: Skin is warm and dry.  Neurological:     Mental Status: She is alert.  Psychiatric:        Mood and Affect: Mood normal.    ED Results / Procedures / Treatments   Labs (all labs ordered are listed, but only abnormal results are displayed) Labs Reviewed - No data to display  EKG None  Radiology No results found.  Procedures Procedures   Medications Ordered in ED Medications - No data to display  ED Course  I have reviewed the triage vital signs and the nursing notes.  Pertinent labs & imaging results that were available during my care of the patient were reviewed by me and considered in my medical decision making (see chart for details).  MDM Rules/Calculators/A&P                          Initial impression-presents with right leg and foot pain alert no acute distress vital  signs normal for tachycardia.  Will recommend wrapping leg with Ace bandage.  Work-up-due to well-appearing patient, benign physical exam, further lab work and imaging not warranted at this time.  Rule out-I have low suspicion for ACS as patient has chest pain, shortness of breath, presentation atypical etiology.  Low suspicion for CHF exacerbation as she denies shortness of breath, chest pain, lung sounds were clear bilaterally, she has no history of this, echo was performed on 10/17 shows no evidence of CHF..  I have low suspicion for DVT as she has no tenderness along her calf, she had ultrasound done 10/27 which was unremarkable, will defer on repeat DVT study.  I have low suspicion for cellulitis and/or abscess as there is no overlying skin changes, she has low risk factors of this, denies any IV drug use.  I have low suspicion for PE as she denies pleuritic chest pain, she is nonhypoxic, nontachypneic, is noted that she is tacky but this is likely secondary due to pain.  Plan-  Right leg swelling pain-likely multifactorial possible portal hypertension secondary due to cirrhosis as well as a lipoma noted in the right upper thigh possible pressure on the venous system.  We will have her follow-up with general surgery, recommend compression stockings, given strict return precautions.  Vital signs have remained stable, no indication for hospital admission.  Patient discussed with attending and they agreed with assessment and plan.  Patient given at home care as well strict return precautions.  Patient verbalized that they understood agreed to said plan.  Final Clinical Impression(s) / ED Diagnoses Final diagnoses:  Leg swelling    Rx / DC Orders ED Discharge Orders          Ordered    traMADol (ULTRAM) 50 MG tablet  Every 6 hours PRN        12/12/20 1831             Marcello Fennel, PA-C 12/12/20 1832    Blanchie Dessert, MD 12/16/20 1334

## 2020-12-27 ENCOUNTER — Encounter: Payer: Self-pay | Admitting: Physician Assistant

## 2020-12-27 ENCOUNTER — Ambulatory Visit (HOSPITAL_COMMUNITY)
Admission: RE | Admit: 2020-12-27 | Discharge: 2020-12-27 | Disposition: A | Discharge status: Home / Self Care | Payer: Commercial Managed Care - PPO | Source: Ambulatory Visit | Attending: Physician Assistant | Admitting: Physician Assistant

## 2020-12-27 ENCOUNTER — Other Ambulatory Visit: Payer: Self-pay

## 2020-12-27 ENCOUNTER — Ambulatory Visit (INDEPENDENT_AMBULATORY_CARE_PROVIDER_SITE_OTHER): Payer: Commercial Managed Care - PPO | Admitting: Physician Assistant

## 2020-12-27 VITALS — BP 121/79 | HR 112 | Temp 98.3°F | Resp 20 | Ht 69.0 in | Wt 250.4 lb

## 2020-12-27 DIAGNOSIS — I872 Venous insufficiency (chronic) (peripheral): Secondary | ICD-10-CM | POA: Diagnosis not present

## 2020-12-27 DIAGNOSIS — M79604 Pain in right leg: Secondary | ICD-10-CM

## 2020-12-27 DIAGNOSIS — M7989 Other specified soft tissue disorders: Secondary | ICD-10-CM | POA: Diagnosis not present

## 2020-12-27 DIAGNOSIS — I890 Lymphedema, not elsewhere classified: Secondary | ICD-10-CM | POA: Diagnosis not present

## 2020-12-27 NOTE — Progress Notes (Signed)
Requested by:  Wendie Agreste, MD [id number] [place],  [place] [address]  Reason for consultation: RLE swelling    History of Present Illness   Teresa Castaneda is a 50 y.o. (Aug 09, 1970) female who presents for evaluation of RLE swelling and pain. This began in October. She says it sort of happened all the sudden. She fell and broke her left ankle at end of September just prior to this starting. Initially she was told it was occurring because she was bearing more weight on her right leg but it has never gotten better. The pain is constant and is worsened when she ambulates. She describes the pain as aching and heaviness. It is is worse on the top of her foot but seems to be moving up the leg. The swelling typically increases the day goes on but it is still present upon waking. She denies redness or skin changes. She states that she has been using compression stockings but more recently has not been able to get them on either because of the swelling or because it hurts too much. She tried ACE wraps but that also made her foot/ ankle hurt more. She has been elevating her legs and she also takes Lasix without much improvement. Patient states that she has been evaluated by her PCP as well as her cardiologist and she has had prior ultrasound that was negative for DVT.  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 She was seen by general surgery for further evaluation but they felt it was not the cause of her swelling. She has no hx of DVT. No family history of DVT or venous disease. She is diabetic and does have some neuropathy in her feet. She takes Gabapentin for this.  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[employer], commissioning Aggravating factors: (sitting, standing) Alleviating factors: (elevation) Compression:  yes Helps:  no Pain  medications:  Gabapentin Previous vein procedures:  No History of DVT:  No  Past Medical History:  Diagnosis Date   Allergies    Anxiety    Arthritis    Asthma    Back pain    Spinal Issue    Diabetes mellitus without complication (HCC)    DJD (degenerative joint disease), lumbar    Elevated cholesterol    Gallstone    GERD (gastroesophageal reflux disease)    Liver cirrhosis secondary to NASH ([place])    Neuromuscular disorder ([place])    Obesity    Oxygen deficiency    Thyroid disease     Past Surgical History:  Procedure Laterality Date   ABDOMINAL HYSTERECTOMY  02/21/2003   CHOLECYSTECTOMY N/A 05/10/2020   Procedure: LAPAROSCOPIC CHOLECYSTECTOMY WITH INTRAOPERATIVE CHOLANGIOGRAM AND LIVER BIOPSY;  Surgeon: Felicie Morn, MD;  Location: [facility];  Service: General;  Laterality: N/A;   ENDOSCOPIC TURBINATE REDUCTION Bilateral 10/13/2020   Procedure: ENDOSCOPIC BILATERAL INFERIOR TURBINATE REDUCTIONS;  Surgeon: Rozetta Nunnery, MD;  Location: [facility];  Service: ENT;  Laterality: Bilateral;   ETHMOIDECTOMY Bilateral 10/13/2020   Procedure: BILATERAL TOTAL ETHMOIDECTOMY AND MAXILLARY OSTIA ENLARGEMENTS;  Surgeon: Rozetta Nunnery, MD;  Location: [facility];  Service: ENT;  Laterality: Bilateral;   LIVER BIOPSY     NASAL SINUS SURGERY Bilateral 10/13/2020   Procedure: SINUS ENDOSCOPY WITH STEALTH NAVIGATION;  Surgeon: Rozetta Nunnery, MD;  Location: [facility];  Service: ENT;  Laterality: Bilateral;   TONSILLECTOMY      Social History   Socioeconomic History   Marital status: Single    Spouse name: Not on file   Number of children: Not on file   Years of education: Not on file   Highest education level: Not on file  Occupational History   Not on file  Tobacco Use   Smoking status: Former    Packs/day: 0.50    Types: Cigarettes    Start date: 01/16/1985    Quit date: 10/08/2019    Years since quitting: 1.2    Smokeless tobacco: Never  Vaping Use   Vaping Use: Never used  Substance and Sexual Activity   Alcohol use: No   Drug use: No   Sexual activity: Not on file  Other Topics Concern   Not on file  Social History Narrative   Not on file   Social Determinants of Health   Financial Resource Strain: Not on file  Food Insecurity: Not on file  Transportation Needs: Not on file  Physical Activity: Not on file  Stress: Not on file  Social Connections: Not on file  Intimate Partner Violence: Not on file    Family History  Problem Relation Age of Onset   Coronary artery disease Mother    Diabetes Mother    COPD Mother    Coronary artery disease Father    Hypertension Father    Colon cancer Neg Hx    Esophageal cancer Neg Hx     Current Outpatient Medications  Medication Sig Dispense Refill   albuterol (VENTOLIN HFA) 108 (90 Base) MCG/ACT inhaler Inhale 2 puffs into the lungs every 6 (six) hours as needed. For shortness of breath and wheezing 18 g 3   ARMOUR THYROID 180 MG tablet Take 1 tablet (180 mg total) by mouth daily. 90 tablet 3   atorvastatin (LIPITOR) 10 MG tablet Take 1 tablet (10 mg total) by mouth daily. 90 tablet 3   DULoxetine (CYMBALTA) 60 MG capsule Take 1 capsule (60 mg total) by mouth daily. Start once per day and increase to 2 po qd after 1 week if tolerated. 90 capsule 1   FARXIGA 10 MG TABS tablet Take 10 mg by mouth daily.     Fluticasone-Umeclidin-Vilant (TRELEGY ELLIPTA) 100-62.5-25 MCG/ACT AEPB Inhale 1 puff into the lungs daily. 60 each 5   furosemide (LASIX) 20 MG tablet Take 1 tablet (20 mg total) by mouth 2 (two) times daily. 60 tablet 0   gabapentin (NEURONTIN) 100 MG capsule Take 1 capsule (100 mg total) by mouth 3 (three) times daily. 1 po Qam, 2 po Qpm. (Patient taking differently: Take 300 mg by mouth 2 (two) times daily. 1 po Qam, 2 po Qpm.) 90 capsule 1   hydrOXYzine (ATARAX/VISTARIL) 10 MG tablet Take 1-2 tablets (10-20 mg total) by mouth 3  (three) times daily as needed for anxiety (or sleep.). 90 tablet 1   ibuprofen (ADVIL) 600 MG tablet Take 1 tablet (600 mg total) by mouth in the morning and at bedtime. 30 tablet 0   levocetirizine (XYZAL) 5 MG tablet Take 5 mg by mouth every evening.     lidocaine (LIDODERM) 5 % USE 1 PATCH EXTERNALLY ONCE DAILY REMOVE  AND  DISCARD  PATCH  WITH  12  HOURS  OR  AS  DIRECTED  BY  MD 30 patch 0   metFORMIN (GLUCOPHAGE) 850 MG  tablet Take 1 tablet (850 mg total) by mouth 2 (two) times daily with a meal. 60 tablet 3   mometasone (NASONEX) 50 MCG/ACT nasal spray Place 1 spray into the nose daily. 1 spray per nostril daily     omeprazole (PRILOSEC) 40 MG capsule Take 1 capsule (40 mg total) by mouth daily. 90 capsule 3   potassium chloride (KLOR-CON 10) 10 MEQ tablet Take 1 tablet (10 mEq total) by mouth daily. 30 tablet 0   XIFAXAN 550 MG TABS tablet Take 1 tablet by mouth twice daily 60 tablet 12   No current facility-administered medications for this visit.    Allergies  Allergen Reactions   Clarithromycin Other (See Comments)    Body cramps    Codeine Nausea And Vomiting   Tylenol [Acetaminophen] Other (See Comments)    Can not take Tylenol products due to liver    REVIEW OF SYSTEMS (negative unless checked):   Cardiac:  []  Chest pain or chest pressure? []  Shortness of breath upon activity? []  Shortness of breath when lying flat? []  Irregular heart rhythm?  Vascular:  []  Pain in calf, thigh, or hip brought on by walking? []  Pain in feet at night that wakes you up from your sleep? []  Blood clot in your veins? [x]  Leg swelling?  Pulmonary:  []  Oxygen at home? []  Productive cough? []  Wheezing?  Neurologic:  []  Sudden weakness in arms or legs? []  Sudden numbness in arms or legs? []  Sudden onset of difficult speaking or slurred speech? []  Temporary loss of vision in one eye? []  Problems with dizziness?  Gastrointestinal:  []  Blood in stool? []  Vomited  blood?  Genitourinary:  []  Burning when urinating? []  Blood in urine?  Psychiatric:  []  Major depression  Hematologic:  []  Bleeding problems? []  Problems with blood clotting?  Dermatologic:  []  Rashes or ulcers?  Constitutional:  []  Fever or chills?  Ear/Nose/Throat:  []  Change in hearing? []  Nose bleeds? []  Sore throat?  Musculoskeletal:  []  Back pain? []  Joint pain? []  Muscle pain?   Physical Examination     Vitals:   12/27/20 1436  BP: 121/79  Pulse: (!) 112  Resp: 20  Temp: 98.3 F (36.8 C)  TempSrc: Temporal  SpO2: 96%  Weight: 250 lb 6.4 oz (113.6 kg)  Height: 5' 9"  (1.753 m)   Body mass index is 36.98 kg/m.  General:  WDWN in NAD; vital signs documented above Gait: Normal HENT: WNL, normocephalic Pulmonary: normal non-labored breathing , without  wheezing Cardiac: regular HR, without  Murmurs  Abdomen: obese, soft Vascular Exam/Pulses:2+ radial pulses, 2+ popliteal and DP and PT pulses bilaterally Extremities: without varicose veins, without reticular veins, with 2+ pitting edema of right leg from proximal shin to dorsum of foot, without stasis pigmentation, without lipodermatosclerosis, without ulcers. Feet warm and well perfused Musculoskeletal: no muscle wasting or atrophy  Neurologic: A&O X 3;  No focal weakness or paresthesias are detected Psychiatric:  The pt has Normal affect.  Non-invasive Vascular Imaging   BLE Venous Insufficiency Duplex (12/27/20):  RLE:  No DVT and SVT No GSV reflux GSV diameter 0.24-0.55 No SSV reflux  CFV deep venous reflux   Medical Decision Making   CALANI GICK is a 50 y.o. female who presents with RLE swelling and pain. Her duplex today shows No DVT or SVT. She has no GSV or SSV reflux. She does have some deep venous reflux in the CFV but otherwise competent veins in the RLE. Likely she has  some lymphedema in her RLE. Based on the patient's history and examination, I recommend continued daily  elevation of 20-30 min above level of the heart, 15-20 mmHg of knee hight compression stockings, exercise, refraining from prolonged sitting or standing, and weight reduction. I will have her try these conservative measures for 6 weeks and return for evaluation for possible lymphatic pump   Karoline Caldwell, PA-C [facility]: [phone]  12/27/2020, 3:07 PM  Clinic MD: Joycelyn Das

## 2021-01-11 ENCOUNTER — Ambulatory Visit: Admit: 2021-01-11 | Payer: Commercial Managed Care - PPO | Admitting: Orthopedic Surgery

## 2021-01-11 HISTORY — PX: DG SACROILIAC JOINTS (ARMC HX): HXRAD1601

## 2021-01-11 SURGERY — SACROILIAC JOINT FUSION
Anesthesia: General

## 2021-01-26 ENCOUNTER — Telehealth: Payer: Self-pay | Admitting: Gastroenterology

## 2021-01-26 ENCOUNTER — Telehealth: Payer: Self-pay

## 2021-01-26 NOTE — Telephone Encounter (Signed)
Patient called states she has stage 4 cirrhosis and she is now leaking fluid from her pours on her leg and she is seeking further advise.

## 2021-01-26 NOTE — Telephone Encounter (Signed)
Pt called with c/o RLE sweling with fluid weeping from it since yesterday. She has a 6 week f/u with APP. She has called her PCP and is seeing them tomorrow. I have moved up her appt to next week, instead of initially being scheduled the week after that. Pt is wearing her compression stockings and elevating her leg with no prolonged sitting or standing.

## 2021-01-26 NOTE — Telephone Encounter (Signed)
Pt states that she has been weeping from her Legs since yesterday. Pt was notified to reach out to her PCP to be evaluated: Pt verbalized understanding with all questions answered.

## 2021-01-27 ENCOUNTER — Other Ambulatory Visit: Payer: Self-pay

## 2021-01-27 ENCOUNTER — Encounter: Payer: Self-pay | Admitting: Registered Nurse

## 2021-01-27 ENCOUNTER — Ambulatory Visit (INDEPENDENT_AMBULATORY_CARE_PROVIDER_SITE_OTHER): Payer: Commercial Managed Care - PPO | Admitting: Registered Nurse

## 2021-01-27 VITALS — BP 110/60 | HR 93 | Temp 98.2°F | Resp 18 | Ht 67.0 in | Wt 257.4 lb

## 2021-01-27 DIAGNOSIS — L03115 Cellulitis of right lower limb: Secondary | ICD-10-CM | POA: Diagnosis not present

## 2021-01-27 MED ORDER — MUPIROCIN 2 % EX OINT: 1.0000 "application " | TOPICAL_OINTMENT | Freq: Two times a day (BID) | CUTANEOUS | 0 refills | Status: DC

## 2021-01-27 MED ORDER — DOXYCYCLINE HYCLATE 100 MG PO TABS: 100.0000 mg | ORAL_TABLET | Freq: Two times a day (BID) | ORAL | 0 refills | Status: DC

## 2021-01-27 NOTE — Patient Instructions (Addendum)
Ms. Bogucki -  Doristine Devoid to see you!  Take doxycycline 160m twice daily for a week  Apply mupirocin twice daily.  Call if worsening or failing to improve.  Thank you  Rich     If you have lab work done today you will be contacted with your lab results within the next 2 weeks.  If you have not heard from [facility] please contact [facility] The fastest way to get your results is to register for My Chart.   IF you received an x-ray today, you will receive an [facility] from [facility]. Please contact [facility] at [phone] questions or concerns regarding your invoice.   IF you received labwork today, you will receive an invoice from [facility] Please contact [facility] at [phone] questions or concerns regarding your invoice.   Our billing staff will not be able to assist you with questions regarding bills from these companies.  You will be contacted with the lab results as soon as they are available. The fastest way to get your results is to activate your My Chart account. Instructions are located on the last page of this paperwork. If you have not heard from [facility] the results in 2 weeks, please contact this office.

## 2021-02-02 ENCOUNTER — Ambulatory Visit: Payer: Commercial Managed Care - PPO

## 2021-02-02 NOTE — Progress Notes (Signed)
Established Patient Office Visit  Subjective:  Patient ID: Teresa Castaneda, female    DOB: 1970-02-22  Age: 51 y.o. MRN: [id number]  CC:  Chief Complaint  Patient presents with   Leg Swelling    Patient states she has had some leg swelling and some fluid coming out.    HPI [name] presents for leg swelling  L>R Onset around 1 week ago Concerned for cellulitis Some redness, no warmth.  Notes some distinct lesions with weeping of serous fluid No systemic symptoms, streaking up legs, joint pains.  Otherwise no acute concerns.   Past Medical History:  Diagnosis Date   Allergies    Anxiety    Arthritis    Asthma    Back pain    Spinal Issue    Diabetes mellitus without complication (HCC)    DJD (degenerative joint disease), lumbar    Elevated cholesterol    Gallstone    GERD (gastroesophageal reflux disease)    Liver cirrhosis secondary to NASH ([place])    Neuromuscular disorder ([place])    Obesity    Oxygen deficiency    Thyroid disease     Past Surgical History:  Procedure Laterality Date   ABDOMINAL HYSTERECTOMY  02/21/2003   CHOLECYSTECTOMY N/A 05/10/2020   Procedure: LAPAROSCOPIC CHOLECYSTECTOMY WITH INTRAOPERATIVE CHOLANGIOGRAM AND LIVER BIOPSY;  Surgeon: Felicie Morn, MD;  [facility];  Service: General;  Laterality: N/A;   ENDOSCOPIC TURBINATE REDUCTION Bilateral 10/13/2020   Procedure: ENDOSCOPIC BILATERAL INFERIOR TURBINATE REDUCTIONS;  Surgeon: Rozetta Nunnery, MD;  Location: [facility];  Service: ENT;  Laterality: Bilateral;   ETHMOIDECTOMY Bilateral 10/13/2020   Procedure: BILATERAL TOTAL ETHMOIDECTOMY AND MAXILLARY OSTIA ENLARGEMENTS;  Surgeon: Rozetta Nunnery, MD;  Location: [facility];  Service: ENT;  Laterality: Bilateral;   LIVER BIOPSY     NASAL SINUS SURGERY Bilateral 10/13/2020   Procedure: SINUS ENDOSCOPY WITH STEALTH NAVIGATION;  Surgeon: Rozetta Nunnery, MD;  Location: [facility];  Service: ENT;  Laterality: Bilateral;   TONSILLECTOMY      Family History  Problem Relation Age of Onset   Coronary artery disease Mother    Diabetes Mother    COPD Mother    Coronary artery disease Father    Hypertension Father    Colon cancer Neg Hx    Esophageal cancer Neg Hx     Social History   Socioeconomic History   Marital status: Single    Spouse name: Not on file   Number of children: Not on file   Years of education: Not on file   Highest education level: Not on file  Occupational History   Not on file  Tobacco Use   Smoking status: Former    Packs/day: 0.50    Types: Cigarettes    Start date: 01/16/1985    Quit date: 10/08/2019    Years since quitting: 1.3   Smokeless tobacco: Never  Vaping Use   Vaping Use: Never used  Substance and Sexual Activity   Alcohol use: No   Drug use: No   Sexual activity: Not on file  Other Topics Concern   Not on file  Social History Narrative   Not on file   Social Determinants of Health   Financial Resource Strain: Not on file  Food Insecurity: Not on file  Transportation Needs: Not on file  Physical Activity: Not on file  Stress: Not on file  Social Connections: Not on file  Intimate  Partner Violence: Not on file    Outpatient Medications Prior to Visit  Medication Sig Dispense Refill   albuterol (VENTOLIN HFA) 108 (90 Base) MCG/ACT inhaler Inhale 2 puffs into the lungs every 6 (six) hours as needed. For shortness of breath and wheezing 18 g 3   ARMOUR THYROID 180 MG tablet Take 1 tablet (180 mg total) by mouth daily. 90 tablet 3   atorvastatin (LIPITOR) 10 MG tablet Take 1 tablet (10 mg total) by mouth daily. 90 tablet 3   DULoxetine (CYMBALTA) 60 MG capsule Take 1 capsule (60 mg total) by mouth daily. Start once per day and increase to 2 po qd after 1 week if tolerated. 90 capsule 1   FARXIGA 10 MG TABS tablet Take 10 mg by mouth daily.     Fluticasone-Umeclidin-Vilant (TRELEGY ELLIPTA)  100-62.5-25 MCG/ACT AEPB Inhale 1 puff into the lungs daily. 60 each 5   furosemide (LASIX) 20 MG tablet Take 1 tablet (20 mg total) by mouth 2 (two) times daily. 60 tablet 0   gabapentin (NEURONTIN) 100 MG capsule Take 1 capsule (100 mg total) by mouth 3 (three) times daily. 1 po Qam, 2 po Qpm. (Patient taking differently: Take 300 mg by mouth 2 (two) times daily. 1 po Qam, 2 po Qpm.) 90 capsule 1   hydrOXYzine (ATARAX/VISTARIL) 10 MG tablet Take 1-2 tablets (10-20 mg total) by mouth 3 (three) times daily as needed for anxiety (or sleep.). 90 tablet 1   ibuprofen (ADVIL) 600 MG tablet Take 1 tablet (600 mg total) by mouth in the morning and at bedtime. 30 tablet 0   levocetirizine (XYZAL) 5 MG tablet Take 5 mg by mouth every evening.     lidocaine (LIDODERM) 5 % USE 1 PATCH EXTERNALLY ONCE DAILY REMOVE  AND  DISCARD  PATCH  WITH  12  HOURS  OR  AS  DIRECTED  BY  MD 30 patch 0   metFORMIN (GLUCOPHAGE) 850 MG tablet Take 1 tablet (850 mg total) by mouth 2 (two) times daily with a meal. 60 tablet 3   mometasone (NASONEX) 50 MCG/ACT nasal spray Place 1 spray into the nose daily. 1 spray per nostril daily     omeprazole (PRILOSEC) 40 MG capsule Take 1 capsule (40 mg total) by mouth daily. 90 capsule 3   potassium chloride (KLOR-CON 10) 10 MEQ tablet Take 1 tablet (10 mEq total) by mouth daily. 30 tablet 0   XIFAXAN 550 MG TABS tablet Take 1 tablet by mouth twice daily 60 tablet 12   No facility-administered medications prior to visit.    Allergies  Allergen Reactions   Clarithromycin Other (See Comments)    Body cramps    Codeine Nausea And Vomiting   Tylenol [Acetaminophen] Other (See Comments)    Can not take Tylenol products due to liver    ROS Review of Systems Per hpi     Objective:    Physical Exam Vitals and nursing note reviewed.  Constitutional:      General: She is not in acute distress.    Appearance: Normal appearance. She is normal weight. She is not ill-appearing,  toxic-appearing or diaphoretic.  Cardiovascular:     Rate and Rhythm: Normal rate and regular rhythm.     Heart sounds: Normal heart sounds. No murmur heard.   No friction rub. No gallop.  Pulmonary:     Effort: Pulmonary effort is normal. No respiratory distress.     Breath sounds: Normal breath sounds. No stridor. No wheezing,  rhonchi or rales.  Chest:     Chest wall: No tenderness.  Musculoskeletal:        General: Normal range of motion.     Right lower leg: Edema (mild) present.     Left lower leg: Edema (+2 pitting) present.  Skin:    General: Skin is warm and dry.     Capillary Refill: Capillary refill takes less than 2 seconds.  Neurological:     General: No focal deficit present.     Mental Status: She is alert and oriented to person, place, and time. Mental status is at baseline.  Psychiatric:        Mood and Affect: Mood normal.        Behavior: Behavior normal.        Thought Content: Thought content normal.        Judgment: Judgment normal.    BP 110/60    Pulse 93    Temp 98.2 F (36.8 C) (Temporal)    Resp 18    Ht 5' 7"  (1.702 m)    Wt 257 lb 6.4 oz (116.8 kg)    SpO2 99%    BMI 40.31 kg/m  Wt Readings from Last 3 Encounters:  01/27/21 257 lb 6.4 oz (116.8 kg)  12/27/20 250 lb 6.4 oz (113.6 kg)  12/06/20 255 lb 12.8 oz (116 kg)     Health Maintenance Due  Topic Date Due   COVID-19 Vaccine (2 - Booster for Janssen series) 06/21/2019   URINE MICROALBUMIN  02/11/2021    There are no preventive care reminders to display for this patient.  Lab Results  Component Value Date   TSH 0.55 07/09/2020   Lab Results  Component Value Date   WBC 17.1 (H) 10/20/2020   HGB 12.6 10/20/2020   HCT 39.7 10/20/2020   MCV 79.5 10/20/2020   PLT 192.0 10/20/2020   Lab Results  Component Value Date   NA 134 (L) 10/20/2020   K 4.3 10/20/2020   CO2 26 10/20/2020   GLUCOSE 152 (H) 10/20/2020   BUN 17 10/20/2020   CREATININE 0.60 10/20/2020   BILITOT 1.0 10/20/2020    ALKPHOS 94 10/20/2020   AST 27 10/20/2020   ALT 41 (H) 10/20/2020   PROT 7.0 10/20/2020   ALBUMIN 3.8 10/20/2020   CALCIUM 9.0 10/20/2020   ANIONGAP 8 10/08/2020   GFR 105.79 10/20/2020   Lab Results  Component Value Date   CHOL 158 01/28/2020   Lab Results  Component Value Date   HDL 43 01/28/2020   Lab Results  Component Value Date   LDLCALC 97 01/28/2020   Lab Results  Component Value Date   TRIG 100 01/28/2020   Lab Results  Component Value Date   CHOLHDL 3.7 01/28/2020   Lab Results  Component Value Date   HGBA1C 8.1 (A) 10/15/2020      Assessment & Plan:   Problem List Items Addressed This Visit   None Visit Diagnoses     Cellulitis of right lower leg    -  Primary   Relevant Medications   doxycycline (VIBRA-TABS) 100 MG tablet   mupirocin ointment (BACTROBAN) 2 %       Meds ordered this encounter  Medications   doxycycline (VIBRA-TABS) 100 MG tablet    Sig: Take 1 tablet (100 mg total) by mouth 2 (two) times daily.    Dispense:  14 tablet    Refill:  0    Order Specific Question:   Supervising Provider  Answer:   Carlota Raspberry, JEFFREY R [[id number]]   mupirocin ointment (BACTROBAN) 2 %    Sig: Apply 1 application topically 2 (two) times daily.    Dispense:  22 g    Refill:  0    Order Specific Question:   Supervising Provider    Answer:   Carlota Raspberry, JEFFREY R [[id number]]    Follow-up: Return if symptoms worsen or fail to improve.   PLAN Appears as early cellulitis. Will give doxycycline and bactrim as above Discussed reduction of risk with elevating legs, compression, and keeping legs clean and dry. Return if worsening or failing to improve. Patient encouraged to call clinic with any questions, comments, or concerns.  Maximiano Coss, NP

## 2021-02-07 ENCOUNTER — Ambulatory Visit: Payer: Commercial Managed Care - PPO

## 2021-02-11 ENCOUNTER — Ambulatory Visit (INDEPENDENT_AMBULATORY_CARE_PROVIDER_SITE_OTHER): Payer: BC Managed Care – PPO | Admitting: Gastroenterology

## 2021-02-11 ENCOUNTER — Ambulatory Visit: Payer: Commercial Managed Care - PPO | Admitting: Gastroenterology

## 2021-02-11 ENCOUNTER — Encounter: Payer: Self-pay | Admitting: Gastroenterology

## 2021-02-11 ENCOUNTER — Other Ambulatory Visit: Payer: Self-pay

## 2021-02-11 ENCOUNTER — Other Ambulatory Visit (INDEPENDENT_AMBULATORY_CARE_PROVIDER_SITE_OTHER): Payer: BC Managed Care – PPO

## 2021-02-11 VITALS — BP 122/78 | HR 106 | Ht 66.0 in | Wt 257.0 lb

## 2021-02-11 DIAGNOSIS — K219 Gastro-esophageal reflux disease without esophagitis: Secondary | ICD-10-CM

## 2021-02-11 DIAGNOSIS — K7581 Nonalcoholic steatohepatitis (NASH): Secondary | ICD-10-CM | POA: Diagnosis not present

## 2021-02-11 LAB — COMPREHENSIVE METABOLIC PANEL
ALT: 30 U/L (ref 0–35)
AST: 29 U/L (ref 0–37)
Albumin: 3.8 g/dL (ref 3.5–5.2)
Alkaline Phosphatase: 123 U/L — ABNORMAL HIGH (ref 39–117)
BUN: 11 mg/dL (ref 6–23)
CO2: 27 mEq/L (ref 19–32)
Calcium: 8.9 mg/dL (ref 8.4–10.5)
Chloride: 100 mEq/L (ref 96–112)
Creatinine, Ser: 0.61 mg/dL (ref 0.40–1.20)
GFR: 105.14 mL/min (ref >60.00)
Glucose, Bld: 165 mg/dL — ABNORMAL HIGH (ref 70–99)
Potassium: 4.3 mEq/L (ref 3.5–5.1)
Sodium: 137 mEq/L (ref 135–145)
Total Bilirubin: 0.6 mg/dL (ref 0.2–1.2)
Total Protein: 7.2 g/dL (ref 6.0–8.3)

## 2021-02-11 LAB — PROTIME-INR
INR: 1.1 ratio — ABNORMAL HIGH (ref 0.8–1.0)
Prothrombin Time: 11.7 s (ref 9.6–13.1)

## 2021-02-11 NOTE — Progress Notes (Signed)
Chief Complaint: FU  Referring Provider:  Wendie Agreste, MD      ASSESSMENT AND PLAN;   #1. GERD with occ RUQ pain (likely musculoskeletal)  #2. 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 No EV on EGD 07/2020. No ascites.  -Neg extensive work-up. No ETOH.   -AFP was borderline elevated.  Neg CT Abdo/pelvis for any liver masses 05/2020.   -Has subclinical hepatic encephalopathy.  Could not tolerate lactulose. On rifaximin 550 twice daily.  #3. H/O biliary colic s/p lap chole with neg IOC and liver Bx 05/10/2020.  Plan: -Low salt, normal protein diet -CBC, CMP, PT INR, AFP. -[place] abdo complete April 2023. -Continue Rifaxamin 548m po BID. Given samples and filled forms for pt assistance  -Continue omeprazole 483mpo QD #90 -Discussed weight loss in detail again. Aim to reduce 6lb over next 12 weeks. -Best possible control for DM. -FU in 6 months.     HPI:    Teresa SHELBURNEs a [age].o. female  With DM, obesity, DJD, HLD, anxiety, L foot #, sinus Sx  For follow-up visit.  Lost insurance as she lost her job. Has applied for disability. Husband has added her to his INS. she is currently in between and cannot afford rifaximin.  I have given her samples and also filled forms for patient assistance.  Occ nausea. No vomiting, heartburn, regurgitation, odynophagia or dysphagia.  No significant diarrhea or constipation.  No melena or hematochezia. No unintentional weight loss. No abdominal pain.  Doing well from GI standpoint.  She told me that omeprazole worked better than Protonix for reflux.  No further leg swelling  No further diarrhea ever since she stopped hamburgers and wheat bread.  S/P SI joint fused and hence cannot walk much currently.  She has gained weight as below  Wt Readings from Last 3 Encounters:  02/11/21 257 lb (116.6 kg)  01/27/21 257 lb 6.4 oz (116.8 kg)  12/27/20 250 lb 6.4 oz (113.6  kg)     Had Biliary colic (RUQ pain) , underwent lap chole with neg IOC and liver Bx 05/10/2020. [facility].  She does have FH of NASH cirrhosis (mom).  Unfortunately, her mom has passed away due to complications from diabetes.  Could not tolerate lactulose due to diarrhea.  Denies having any significant change in her mental status.  She continues to be on rifaximin.  No skin lesions, easy bruisability, intake of OTC meds including diet pills, herbal medications, anabolic steroids or Tylenol. There is no H/O blood transfusions, IVDA. No jaundice, dark urine or pale stools. No alcohol abuse.  No chocolates, chewing gums, artificial sweeteners and candy. No NSAIDs.  She would drink 4 Dr. PeSamson Fredericer day.  Has reduced it to 2/day.    Past GI procedures:  Colon 07/2020 - One 6 mm polyp in the proximal ascending colon, removed with a cold snare. Resected and retrieved. Bx- TA - Four 6 to 8 mm polyps in the mid rectum and in the distal sigmoid colon, removed with a cold snare. Resected and retrieved. Bx-hyperplastic. - Non-bleeding internal hemorrhoids. - The examined portion of the ileum was normal. - The examination was otherwise normal on direct and retroflexion views. - Negative random colonic biopsies.  EGD 07/2020 - Mild gastritis. - Salmon-colored mucosa projecting 1 cm above GE junction. Bx- neg for Barrett's. - No esophageal varices. - Negative small bowel biopsies for celiac  disease.  CT Abdo/pelvis with contrast 06/09/2020 1. Morphologic features of the liver compatible with cirrhosis. No suspicious liver lesions identified. 2. Splenomegaly. 3. Aortic atherosclerosis.  [place] 10/2020 1. Cirrhotic morphology of the liver. No focal liver lesion is identified. 2. Sequela of portal hypertension including splenomegaly. 3. Prior cholecystectomy. 4. No Ascites  Additional liver work-up -AFP 6.5 10/15/2020 -ANA  1:40 -Neg AMA, celiac serology, iron studies, alpha-1 antitrypsin, ASMA -Ammonia 55 -Not immune to hepatitis A/B, s/p vaccines    Additional GI history: Op note from lap chole 05/10/2020-reviewed Louanna Raw, MD General, Bariatric, & Minimally Invasive Surgery [facility] [place]) -Cirrhotic appearing liver. Bx-Liver Bx reviewed at [facility] showed steatohepatitis with stage IV cirrhosis. -IOC negative -No ascites or any obvious varices  Past Medical History:  Diagnosis Date   Allergies    Anxiety    Arthritis    Asthma    Back pain    Spinal Issue    Diabetes mellitus without complication (HCC)    DJD (degenerative joint disease), lumbar    Elevated cholesterol    Gallstone    GERD (gastroesophageal reflux disease)    Liver cirrhosis secondary to NASH ([place])    Neuromuscular disorder ([place])    Obesity    Oxygen deficiency    Thyroid disease     Past Surgical History:  Procedure Laterality Date   ABDOMINAL HYSTERECTOMY  02/21/2003   CHOLECYSTECTOMY N/A 05/10/2020   Procedure: LAPAROSCOPIC CHOLECYSTECTOMY WITH INTRAOPERATIVE CHOLANGIOGRAM AND LIVER BIOPSY;  Surgeon: Felicie Morn, MD;  Location: [facility];  Service: General;  Laterality: N/A;   DG SACROILIAC JOINTS ([place] [facility])  01/11/2021   [facility]   ENDOSCOPIC TURBINATE REDUCTION Bilateral 10/13/2020   Procedure: ENDOSCOPIC BILATERAL INFERIOR TURBINATE REDUCTIONS;  Surgeon: Rozetta Nunnery, MD;  Location: [place];  Service: ENT;  Laterality: Bilateral;   ETHMOIDECTOMY Bilateral 10/13/2020   Procedure: BILATERAL TOTAL ETHMOIDECTOMY AND MAXILLARY OSTIA ENLARGEMENTS;  Surgeon: Rozetta Nunnery, MD;  Location: [place];  Service: ENT;  Laterality: Bilateral;   LIVER BIOPSY     NASAL SINUS SURGERY Bilateral 10/13/2020   Procedure: SINUS ENDOSCOPY WITH STEALTH NAVIGATION;  Surgeon: Rozetta Nunnery, MD;  Location: [place];  Service:  ENT;  Laterality: Bilateral;   TONSILLECTOMY      Family History  Problem Relation Age of Onset   Coronary artery disease Mother    Diabetes Mother    COPD Mother    Coronary artery disease Father    Hypertension Father    Colon cancer Neg Hx    Esophageal cancer Neg Hx     Social History   Tobacco Use   Smoking status: Former    Packs/day: 0.50    Types: Cigarettes    Start date: 01/16/1985    Quit date: 10/08/2019    Years since quitting: 1.3   Smokeless tobacco: Never  Vaping Use   Vaping Use: Never used  Substance Use Topics   Alcohol use: No   Drug use: No    Current Outpatient Medications  Medication Sig Dispense Refill   albuterol (VENTOLIN HFA) 108 (90 Base) MCG/ACT inhaler Inhale 2 puffs into the lungs every 6 (six) hours as needed. For shortness of breath and wheezing 18 g 3   ARMOUR THYROID 180 MG tablet Take 1 tablet (180 mg total) by mouth daily. 90 tablet 3   atorvastatin (LIPITOR) 10 MG tablet Take 1 tablet (10 mg total) by mouth daily. 90 tablet 3  cyclobenzaprine (FLEXERIL) 10 MG tablet Take 10 mg by mouth as needed.     doxycycline (VIBRA-TABS) 100 MG tablet Take 1 tablet (100 mg total) by mouth 2 (two) times daily. 14 tablet 0   DULoxetine (CYMBALTA) 60 MG capsule Take 1 capsule (60 mg total) by mouth daily. Start once per day and increase to 2 po qd after 1 week if tolerated. 90 capsule 1   FARXIGA 10 MG TABS tablet Take 10 mg by mouth daily.     Fluticasone-Umeclidin-Vilant (TRELEGY ELLIPTA) 100-62.5-25 MCG/ACT AEPB Inhale 1 puff into the lungs daily. 60 each 5   furosemide (LASIX) 20 MG tablet Take 1 tablet (20 mg total) by mouth 2 (two) times daily. 60 tablet 0   gabapentin (NEURONTIN) 300 MG capsule Take 1 capsule by mouth as directed. 1 capsule in the morning and 2 pills at lunch and 3 pills at night     hydrOXYzine (ATARAX/VISTARIL) 10 MG tablet Take 1-2 tablets (10-20 mg total) by mouth 3 (three) times daily as needed for anxiety (or sleep.). 90  tablet 1   ibuprofen (ADVIL) 600 MG tablet Take 1 tablet (600 mg total) by mouth in the morning and at bedtime. 30 tablet 0   levocetirizine (XYZAL) 5 MG tablet Take 5 mg by mouth every evening.     lidocaine (LIDODERM) 5 % USE 1 PATCH EXTERNALLY ONCE DAILY REMOVE  AND  DISCARD  PATCH  WITH  12  HOURS  OR  AS  DIRECTED  BY  MD 30 patch 0   metFORMIN (GLUCOPHAGE) 850 MG tablet Take 1 tablet (850 mg total) by mouth 2 (two) times daily with a meal. 60 tablet 3   methocarbamol (ROBAXIN) 500 MG tablet Take 500-1,000 mg by mouth as needed.     mometasone (NASONEX) 50 MCG/ACT nasal spray Place 1 spray into the nose daily. 1 spray per nostril daily     omeprazole (PRILOSEC) 40 MG capsule Take 1 capsule (40 mg total) by mouth daily. 90 capsule 3   potassium chloride (KLOR-CON 10) 10 MEQ tablet Take 1 tablet (10 mEq total) by mouth daily. 30 tablet 0   traMADol (ULTRAM) 50 MG tablet Take 50-100 mg by mouth as needed.     meloxicam (MOBIC) 7.5 MG tablet Take 7.5 mg by mouth daily. (Patient not taking: Reported on 02/11/2021)     mupirocin ointment (BACTROBAN) 2 % Apply 1 application topically 2 (two) times daily. (Patient not taking: Reported on 02/11/2021) 22 g 0   XIFAXAN 550 MG TABS tablet Take 1 tablet by mouth twice daily (Patient not taking: Reported on 02/11/2021) 60 tablet 12   No current facility-administered medications for this visit.    Allergies  Allergen Reactions   Clarithromycin Other (See Comments)    Body cramps    Codeine Nausea And Vomiting   Tylenol [Acetaminophen] Other (See Comments)    Can not take Tylenol products due to liver    Review of Systems:  Constitutional: Denies fever, chills, diaphoresis, appetite change and has fatigue.      Physical Exam:    BP 122/78    Pulse (!) 106    Ht 5' 6"  (1.676 m)    Wt 257 lb (116.6 kg)    BMI 41.48 kg/m  Wt Readings from Last 3 Encounters:  02/11/21 257 lb (116.6 kg)  01/27/21 257 lb 6.4 oz (116.8 kg)  12/27/20 250 lb 6.4 oz  (113.6 kg)   Constitutional:  Well-developed, in no acute distress. Psychiatric: Normal  mood and affect. Behavior is normal. HEENT: Pupils normal.  Conjunctivae are normal. No scleral icterus.  Cardiovascular: Normal rate, regular rhythm. No edema Pulmonary/chest: Effort normal and breath sounds normal. No wheezing, rales or rhonchi. Abdominal: Soft, nondistended. Nontender. Bowel sounds active throughout. There are no masses palpable.  Liver palpated 2 cm below costal margin.  No ascites. Rectal: Deferred Neurological: Alert and oriented to person place and time. Skin: Skin is warm and dry. No rashes noted. Extremities-no edema.  Boot over left foot  Data Reviewed: I have personally reviewed following labs and imaging studies  CBC: CBC Latest Ref Rng & Units 10/20/2020 10/08/2020 06/22/2020  WBC 4.0 - 10.5 K/uL 17.1(H) 12.5(H) 6.4  Hemoglobin 12.0 - 15.0 g/dL 12.6 13.7 12.4  Hematocrit 36.0 - 46.0 % 39.7 42.6 37.5  Platelets 150.0 - 400.0 K/uL 192.0 118(L) 117.0(L)    CMP: CMP Latest Ref Rng & Units 10/20/2020 10/08/2020 06/22/2020  Glucose 70 - 99 mg/dL 152(H) 254(H) 165(H)  BUN 6 - 23 mg/dL 17 5(L) 7  Creatinine 0.40 - 1.20 mg/dL 0.60 0.64 0.47  Sodium 135 - 145 mEq/L 134(L) 133(L) 134(L)  Potassium 3.5 - 5.1 mEq/L 4.3 4.3 3.9  Chloride 96 - 112 mEq/L 100 103 101  CO2 19 - 32 mEq/L 26 22 24   Calcium 8.4 - 10.5 mg/dL 9.0 8.4(L) 9.0  Total Protein 6.0 - 8.3 g/dL 7.0 - 6.8  Total Bilirubin 0.2 - 1.2 mg/dL 1.0 - 0.5  Alkaline Phos 39 - 117 U/L 94 - 86  AST 0 - 37 U/L 27 - 73(H)  ALT 0 - 35 U/L 41(H) - 57(H)   Hepatic Function Latest Ref Rng & Units 10/20/2020 06/22/2020 06/02/2020  Total Protein 6.0 - 8.3 g/dL 7.0 6.8 6.9  Albumin 3.5 - 5.2 g/dL 3.8 3.4(L) 3.5  AST 0 - 37 U/L 27 73(H) 43(H)  ALT 0 - 35 U/L 41(H) 57(H) 35  Alk Phosphatase 39 - 117 U/L 94 86 88  Total Bilirubin 0.2 - 1.2 mg/dL 1.0 0.5 1.0  Bilirubin, Direct 0.00 - 0.40 mg/dL - - -     Radiology Studies: No results  found.     Carmell Austria, MD 02/11/2021, 8:56 AM  Cc: Wendie Agreste, MD

## 2021-02-11 NOTE — Patient Instructions (Addendum)
If you are age 50 or younger, your body mass index should be between 19-25. Your There is no height or weight on file to calculate BMI. If this is out of the aformentioned range listed, please consider follow up with your Primary Care Provider.   __________________________________________________________  The [facility] providers would like to encourage you to use [facility] to communicate with providers for non-urgent requests or questions.  Due to long hold times on the telephone, sending your provider a message by [facility] may be a faster and more efficient way to get a response.  Please allow 48 business hours for a response.  Please remember that this is for non-urgent requests.    Due to recent changes in healthcare laws, you may see the results of your imaging and laboratory studies on MyChart before your provider has had a chance to review them.  We understand that in some cases there may be results that are confusing or concerning to you. Not all laboratory results come back in the same time frame and the provider may be waiting for multiple results in order to interpret others.  Please give [place] 48 hours in order for your provider to thoroughly review all the results before contacting the office for clarification of your results.    - Please go to the lab on the 2nd floor [address] before you leave the office today.  - Please follow up in 6 months. Give [place] a call at [phone] to schedule an appointment.  - Please take Low salt, normal protein diet. - Continue Rifaxamin 529m po BID.  - We have given you samples and paperwork to complete for patient assistance. -Continue taking omeprazole 432mpo QD #90 -Aim to reduce 6lb over next 12 weeks.    It was a pleasure to see you today!  RaJackquline DenmarkM.D.

## 2021-02-12 LAB — CBC WITH DIFFERENTIAL
Basophils Absolute: 0.1 10*3/uL (ref 0.0–0.2)
Basos: 1 %
EOS (ABSOLUTE): 0.3 10*3/uL (ref 0.0–0.4)
Eos: 3 %
Hematocrit: 40 % (ref 34.0–46.6)
Hemoglobin: 12.4 g/dL (ref 11.1–15.9)
Immature Grans (Abs): 0 10*3/uL (ref 0.0–0.1)
Immature Granulocytes: 0 %
Lymphocytes Absolute: 3.8 10*3/uL — ABNORMAL HIGH (ref 0.7–3.1)
Lymphs: 33 %
MCH: 24.3 pg — ABNORMAL LOW (ref 26.6–33.0)
MCHC: 31 g/dL — ABNORMAL LOW (ref 31.5–35.7)
MCV: 78 fL — ABNORMAL LOW (ref 79–97)
Monocytes Absolute: 0.7 10*3/uL (ref 0.1–0.9)
Monocytes: 6 %
Neutrophils Absolute: 6.5 10*3/uL (ref 1.4–7.0)
Neutrophils: 57 %
RBC: 5.1 x10E6/uL (ref 3.77–5.28)
RDW: 15.7 % — ABNORMAL HIGH (ref 11.7–15.4)
WBC: 11.4 10*3/uL — ABNORMAL HIGH (ref 3.4–10.8)

## 2021-02-14 LAB — AFP TUMOR MARKER: AFP-Tumor Marker: 4.5 ng/mL

## 2021-02-18 ENCOUNTER — Encounter: Payer: Self-pay | Admitting: Internal Medicine

## 2021-02-18 ENCOUNTER — Other Ambulatory Visit: Payer: Self-pay

## 2021-02-18 ENCOUNTER — Ambulatory Visit (INDEPENDENT_AMBULATORY_CARE_PROVIDER_SITE_OTHER): Payer: BC Managed Care – PPO | Admitting: Internal Medicine

## 2021-02-18 VITALS — BP 134/90 | HR 94 | Ht 66.0 in | Wt 255.8 lb

## 2021-02-18 DIAGNOSIS — E039 Hypothyroidism, unspecified: Secondary | ICD-10-CM

## 2021-02-18 DIAGNOSIS — E1165 Type 2 diabetes mellitus with hyperglycemia: Secondary | ICD-10-CM

## 2021-02-18 LAB — POCT GLYCOSYLATED HEMOGLOBIN (HGB A1C): Hemoglobin A1C: 9.4 % — AB (ref 4.0–5.6)

## 2021-02-18 LAB — LIPID PANEL
Cholesterol: 126 mg/dL (ref 0–200)
HDL: 47.6 mg/dL (ref >39.00)
LDL Cholesterol: 63 mg/dL (ref 0–99)
NonHDL: 78.28
Total CHOL/HDL Ratio: 3
Triglycerides: 77 mg/dL (ref 0.0–149.0)
VLDL: 15.4 mg/dL (ref 0.0–40.0)

## 2021-02-18 LAB — TSH: TSH: 41.1 u[IU]/mL — ABNORMAL HIGH (ref 0.35–5.50)

## 2021-02-18 MED ORDER — GLIPIZIDE 5 MG PO TABS: 5.0000 mg | ORAL_TABLET | Freq: Every day | ORAL | 3 refills | Status: DC

## 2021-02-18 NOTE — Progress Notes (Signed)
Name: Teresa Castaneda  Age/ Sex: 51 y.o., female   MRN/ DOB: [id number], Jun 22, 1970     PCP: Wendie Agreste, MD   Reason for Endocrinology Evaluation: Type 2 Diabetes Mellitus  Initial Endocrine Consultative Visit: 04/05/2020    PATIENT IDENTIFIER: Teresa Castaneda is a 51 y.o. female with a past medical history of T2DM, cirrhosis  and Hypothyroidism. The patient has followed with Endocrinology clinic since 04/05/2020 for consultative assistance with management of her diabetes.  DIABETIC HISTORY:  Teresa Castaneda was diagnosed with DM in 08/2019, Metformin started 08/2019. Her hemoglobin A1c has ranged from 8.1% in 2022, peaking at 9.8% in 2022. Has mild upset stomach since increasing metformin but its transient     THYROID HISTORY: She has been diagnosed with hypothyroidism ~ 5 yrs ago No prior sx or radiation to the neck She was on Levothyroxine in he past but that didn't  work   Pt is on Armour thyroid 180 mg daily and 15 mg daily , we stopped 50 mg of Armour Thyroid which normalized her TSH.  SUBJECTIVE:   During the last visit (10/15/2020): A1c 8.1 % increased Farxiga and continued Metformin and continued Armour thyroid   Today (02/18/2021): Teresa Castaneda  is here for a follow up on diabetes and hypothyroid management. She checks her blood sugars occasionally . The patient has not had hypoglycemic episodes since the last clinic visit   She had back surgery in 12/2020 She denies constipation or diarrhea  She is on metamucil  Has applied for disability     HOME DIABETES REGIMEN:  Metformin 850 mg BID  Farxiga 10 mg daily  Armour thyroid 180 mg daily    Statin: Yes ACE-I/ARB: No Prior Diabetic Education: No   METER DOWNLOAD SUMMARY:  Range 122 -  406  90 day Average 198 mg/dL    DIABETIC COMPLICATIONS: Microvascular complications:  Neuropathy Denies: CKD, retinopathy Last Eye Exam: Completed 03/2020  Macrovascular complications:   Denies: CAD, CVA,  PVD   HISTORY:  Past Medical History:  Past Medical History:  Diagnosis Date   Allergies    Anxiety    Arthritis    Asthma    Back pain    Spinal Issue    Diabetes mellitus without complication (HCC)    DJD (degenerative joint disease), lumbar    Elevated cholesterol    Gallstone    GERD (gastroesophageal reflux disease)    Liver cirrhosis secondary to NASH ([place])    Neuromuscular disorder ([place])    Obesity    Oxygen deficiency    Thyroid disease    Past Surgical History:  Past Surgical History:  Procedure Laterality Date   ABDOMINAL HYSTERECTOMY  02/21/2003   CHOLECYSTECTOMY N/A 05/10/2020   Procedure: LAPAROSCOPIC CHOLECYSTECTOMY WITH INTRAOPERATIVE CHOLANGIOGRAM AND LIVER BIOPSY;  Surgeon: Felicie Morn, MD;  Location: [facility];  Service: General;  Laterality: N/A;   DG SACROILIAC JOINTS ([place] HX)  01/11/2021   [facility]   ENDOSCOPIC TURBINATE REDUCTION Bilateral 10/13/2020   Procedure: ENDOSCOPIC BILATERAL INFERIOR TURBINATE REDUCTIONS;  Surgeon: Rozetta Nunnery, MD;  Location: [facility];  Service: ENT;  Laterality: Bilateral;   ETHMOIDECTOMY Bilateral 10/13/2020   Procedure: BILATERAL TOTAL ETHMOIDECTOMY AND MAXILLARY OSTIA ENLARGEMENTS;  Surgeon: Rozetta Nunnery, MD;  Location: [facility];  Service: ENT;  Laterality: Bilateral;   LIVER BIOPSY     NASAL SINUS SURGERY Bilateral 10/13/2020   Procedure: SINUS ENDOSCOPY WITH STEALTH NAVIGATION;  Surgeon: Rozetta Nunnery,  MD;  Location: [facility];  Service: ENT;  Laterality: Bilateral;   TONSILLECTOMY     Social History:  reports that she quit smoking about 16 months ago. Her smoking use included cigarettes. She started smoking about 36 years ago. She smoked an average of .5 packs per day. She has never used smokeless tobacco. She reports that she does not drink alcohol and does not use drugs. Family History:  Family History  Problem Relation  Age of Onset   Coronary artery disease Mother    Diabetes Mother    COPD Mother    Coronary artery disease Father    Hypertension Father    Colon cancer Neg Hx    Esophageal cancer Neg Hx      HOME MEDICATIONS: Allergies as of 02/18/2021       Reactions   Clarithromycin Other (See Comments)   Body cramps    Codeine Nausea And Vomiting   Tylenol [acetaminophen] Other (See Comments)   Can not take Tylenol products due to liver        Medication List        Accurate as of February 18, 2021  8:59 AM. If you have any questions, ask your nurse or doctor.          albuterol 108 (90 Base) MCG/ACT inhaler Commonly known as: VENTOLIN HFA Inhale 2 puffs into the lungs every 6 (six) hours as needed. For shortness of breath and wheezing   Armour Thyroid 180 MG tablet Generic drug: thyroid Take 1 tablet (180 mg total) by mouth daily.   atorvastatin 10 MG tablet Commonly known as: LIPITOR Take 1 tablet (10 mg total) by mouth daily.   cyclobenzaprine 10 MG tablet Commonly known as: FLEXERIL Take 10 mg by mouth as needed.   doxycycline 100 MG tablet Commonly known as: VIBRA-TABS Take 1 tablet (100 mg total) by mouth 2 (two) times daily.   DULoxetine 60 MG capsule Commonly known as: Cymbalta Take 1 capsule (60 mg total) by mouth daily. Start once per day and increase to 2 po qd after 1 week if tolerated.   Farxiga 10 MG Tabs tablet Generic drug: dapagliflozin propanediol Take 10 mg by mouth daily.   furosemide 20 MG tablet Commonly known as: LASIX Take 1 tablet (20 mg total) by mouth 2 (two) times daily.   gabapentin 300 MG capsule Commonly known as: NEURONTIN Take 1 capsule by mouth as directed. 1 capsule in the morning and 2 pills at lunch and 3 pills at night   hydrOXYzine 10 MG tablet Commonly known as: ATARAX Take 1-2 tablets (10-20 mg total) by mouth 3 (three) times daily as needed for anxiety (or sleep.).   ibuprofen 600 MG tablet Commonly known as:  ADVIL Take 1 tablet (600 mg total) by mouth in the morning and at bedtime.   levocetirizine 5 MG tablet Commonly known as: XYZAL Take 5 mg by mouth every evening.   lidocaine 5 % Commonly known as: LIDODERM USE 1 PATCH EXTERNALLY ONCE DAILY REMOVE  AND  DISCARD  PATCH  WITH  12  HOURS  OR  AS  DIRECTED  BY  MD   meloxicam 7.5 MG tablet Commonly known as: MOBIC Take 7.5 mg by mouth daily.   metFORMIN 850 MG tablet Commonly known as: Glucophage Take 1 tablet (850 mg total) by mouth 2 (two) times daily with a meal.   methocarbamol 500 MG tablet Commonly known as: ROBAXIN Take 500-1,000 mg by mouth as needed.  mometasone 50 MCG/ACT nasal spray Commonly known as: NASONEX Place 1 spray into the nose daily. 1 spray per nostril daily   mupirocin ointment 2 % Commonly known as: BACTROBAN Apply 1 application topically 2 (two) times daily.   omeprazole 40 MG capsule Commonly known as: PRILOSEC Take 1 capsule (40 mg total) by mouth daily.   potassium chloride 10 MEQ tablet Commonly known as: Klor-Con 10 Take 1 tablet (10 mEq total) by mouth daily.   traMADol 50 MG tablet Commonly known as: ULTRAM Take 50-100 mg by mouth as needed.   Trelegy Ellipta 100-62.5-25 MCG/ACT Aepb Generic drug: Fluticasone-Umeclidin-Vilant Inhale 1 puff into the lungs daily.   Xifaxan 550 MG Tabs tablet Generic drug: rifaximin Take 1 tablet by mouth twice daily         OBJECTIVE:   Vital Signs: BP 134/90 (BP Location: Left Arm, Patient Position: Sitting, Cuff Size: Small)    Pulse 94    Ht 5' 6"  (1.676 m)    Wt 255 lb 12.8 oz (116 kg)    SpO2 99%    BMI 41.29 kg/m   Wt Readings from Last 3 Encounters:  02/18/21 255 lb 12.8 oz (116 kg)  02/11/21 257 lb (116.6 kg)  01/27/21 257 lb 6.4 oz (116.8 kg)     Exam: General: Pt appears well and is in NAD  Neck: General: Supple without adenopathy. Thyroid:  No goiter or nodules appreciated.   Lungs: Clear with good BS bilat with no rales,  rhonchi, or wheezes  Heart: RRR   Extremities: Trace edema   Neuro: MS is good with appropriate affect, pt is alert and Ox3   DM Foot Exam 02/18/2021 The skin of the feet is intact without sores or ulcerations. The pedal pulses are 2+ on right and 2+ on left. The sensation is absent to a screening 5.07, 10 gram monofilament bilaterally   DATA REVIEWED:   Lab Results  Component Value Date   HGBA1C 9.4 (A) 02/18/2021   HGBA1C 8.1 (A) 10/15/2020   HGBA1C 8.1 (A) 06/09/2020    Latest Reference Range & Units 02/18/21 09:18  Total CHOL/HDL Ratio  3  Cholesterol 0 - 200 mg/dL 126  HDL Cholesterol >39.00 mg/dL 47.60  LDL (calc) 0 - 99 mg/dL 63  NonHDL  78.28  Triglycerides 0.0 - 149.0 mg/dL 77.0  VLDL 0.0 - 40.0 mg/dL 15.4    Latest Reference Range & Units 02/18/21 09:18  TSH 0.35 - 5.50 uIU/mL 41.10 (H)    Latest Reference Range & Units 02/11/21 09:36  COMPREHENSIVE METABOLIC PANEL  Rpt !  Sodium 135 - 145 mEq/L 137  Potassium 3.5 - 5.1 mEq/L 4.3  Chloride 96 - 112 mEq/L 100  CO2 19 - 32 mEq/L 27  Glucose 70 - 99 mg/dL 165 (H)  BUN 6 - 23 mg/dL 11  Creatinine 0.40 - 1.20 mg/dL 0.61  Calcium 8.4 - 10.5 mg/dL 8.9  Alkaline Phosphatase 39 - 117 U/L 123 (H)  Albumin 3.5 - 5.2 g/dL 3.8  AST 0 - 37 U/L 29  ALT 0 - 35 U/L 30  Total Protein 6.0 - 8.3 g/dL 7.2    ASSESSMENT / PLAN / RECOMMENDATIONS:   1) Type 2 Diabetes Mellitus, Poorly  controlled, With out complications - Most recent A1c of 9.4 %. Goal A1c < 7.0 %.    - Pt with worsening glycemic control  - Intolerant to higher doses of Metformin  -We discussed starting glipizide, cautioned against weight gain and hypoglycemia  MEDICATIONS: Continue Metformin 850 mg, 1 tablet with Breakfast and Supper  Continue Farxiga 10  mg daily  Start Glipizide 5 mg daily    EDUCATION / INSTRUCTIONS: BG monitoring instructions: Patient is instructed to check her blood sugars 1 times a day, fasting. Call [facility]  cl[facility] if: BG persistently < 70  I reviewed the Rule of 15 for the treatment of hypoglycemia in detail with the patient. Literature supplied.    2) Diabetic complications:  Eye: Does not have known diabetic retinopathy.  Neuro/ Feet: Does not have known diabetic peripheral neuropathy .  Renal: Patient does not have known baseline CKD. She   is not on an ACEI/ARB at present.    3) Hypothyroidism:   -Her TSH is way too high at 41  uIU/mL, I suspect this is due to medication nonadherence ,as the same dose had her TSH is 0.55  uIU/mL just a few months ago -We will encourage compliance -If the patient assures me compliance, then we will adjust the dose   Medication  Continue armour thyroid 180 mg daily     F/U in 4 months   Signed electronically by: Mack Guise, MD  [facility]  [address] [place], [place] [address] Phone: [phone] FAX: [phone]   CC: Wendie Agreste, MD [address] [address] Phone: [phone]  Fax: [phone]  Return to Endocrinology clinic as below: Future Appointments  Date Time Provider [place]  02/18/2021  9:10 AM Lathen Seal, Melanie Crazier, MD [id number] None  05/02/2021  8:40 AM Wendie Agreste, MD [id number]

## 2021-02-18 NOTE — Patient Instructions (Addendum)
-   Continue Metformin 850 mg, 1 tablet with Breakfast and Supper  - Continue Farxiga 10 mg daily  - Start Glipizide 5 mg,  1 tablet before breakfast  - Armour thyroid 180 mg daily     HOW TO TREAT LOW BLOOD SUGARS (Blood sugar LESS THAN 70 MG/DL) Please follow the RULE OF 15 for the treatment of hypoglycemia treatment (when your (blood sugars are less than 70 mg/dL)   STEP 1: Take 15 grams of carbohydrates when your blood sugar is low, which includes:  3-4 GLUCOSE TABS  OR 3-4 OZ OF JUICE OR REGULAR SODA OR ONE TUBE OF GLUCOSE GEL    STEP 2: RECHECK blood sugar in 15 MINUTES STEP 3: If your blood sugar is still low at the 15 minute recheck --> then, go back to STEP 1 and treat AGAIN with another 15 grams of carbohydrates.

## 2021-02-21 ENCOUNTER — Other Ambulatory Visit: Payer: Self-pay

## 2021-02-25 ENCOUNTER — Encounter: Payer: Self-pay | Admitting: Internal Medicine

## 2021-02-25 DIAGNOSIS — E1140 Type 2 diabetes mellitus with diabetic neuropathy, unspecified: Secondary | ICD-10-CM

## 2021-02-25 DIAGNOSIS — E1165 Type 2 diabetes mellitus with hyperglycemia: Secondary | ICD-10-CM

## 2021-02-25 DIAGNOSIS — Z9889 Other specified postprocedural states: Secondary | ICD-10-CM | POA: Diagnosis not present

## 2021-02-25 MED ORDER — METFORMIN HCL 850 MG PO TABS: 850.0000 mg | ORAL_TABLET | Freq: Two times a day (BID) | ORAL | 3 refills | Status: DC

## 2021-03-02 ENCOUNTER — Ambulatory Visit: Payer: BC Managed Care – PPO | Admitting: Family Medicine

## 2021-03-02 ENCOUNTER — Encounter: Payer: Self-pay | Admitting: Family Medicine

## 2021-03-02 VITALS — BP 118/74 | HR 78 | Temp 98.0°F | Resp 16 | Ht 66.0 in | Wt 253.4 lb

## 2021-03-02 DIAGNOSIS — J019 Acute sinusitis, unspecified: Secondary | ICD-10-CM | POA: Diagnosis not present

## 2021-03-02 MED ORDER — DOXYCYCLINE HYCLATE 100 MG PO TABS: 100.0000 mg | ORAL_TABLET | Freq: Two times a day (BID) | ORAL | 0 refills | Status: DC

## 2021-03-02 NOTE — Progress Notes (Signed)
Subjective:  Patient ID: Teresa Castaneda, female    DOB: Dec 25, 1970  Age: 51 y.o. MRN: [id number]  CC:  Chief Complaint  Patient presents with   Nasal Congestion    Pt reports some sinus congestion drainage, did have some blood in tissue this morning, pt has fluid in her ears, has been present apx 1 week, has been using eye drops due to irritation     HPI [name] presents for   Sinus congestion, drainage In the past 1 week.  Sinus pressure, headache. Discolored congestion with blood tinged nasal d/c today. Some congestion in her ears.  Some eye irritation, has been using over-the-counter eyedrops. Neg covid test.  Better yesterday, worse today.  Using saline ns, nasacort.  Daughter with cold sx's recently.  GI upset - chronic. Doxycyline worked well for last sinus inf'n.  No fever.   Tx: advil cold and sinus.    History Patient Active Problem List   Diagnosis Date Noted   Cirrhosis of liver ([place]) 06/23/2020   Symptomatic cholelithiasis 05/10/2020   Acquired hypothyroidism 04/05/2020   Weight gain 04/05/2020   Type 2 diabetes mellitus with hyperglycemia, without long-term current use of insulin ([place]) 04/05/2020   History of COVID-19 09/23/2019   Pneumonia due to COVID-19 virus 09/23/2019   Cough 09/23/2019   BMI 40.0-44.9, adult ([place]) 07/22/2019   Facet arthropathy, lumbar 07/22/2019   Sacroiliitis ([place]) 07/22/2019   ANXIETY 09/10/2006   DEPRESSION 09/10/2006   ASTHMA 09/10/2006   HEADACHE 09/10/2006   Past Medical History:  Diagnosis Date   Allergies    Anxiety    Arthritis    Asthma    Back pain    Spinal Issue    Diabetes mellitus without complication (HCC)    DJD (degenerative joint disease), lumbar    Elevated cholesterol    Gallstone    GERD (gastroesophageal reflux disease)    Liver cirrhosis secondary to NASH ([place])    Neuromuscular disorder ([place])    Obesity    Oxygen deficiency    Thyroid disease    Past Surgical History:  Procedure  Laterality Date   ABDOMINAL HYSTERECTOMY  02/21/2003   CHOLECYSTECTOMY N/A 05/10/2020   Procedure: LAPAROSCOPIC CHOLECYSTECTOMY WITH INTRAOPERATIVE CHOLANGIOGRAM AND LIVER BIOPSY;  Surgeon: Felicie Morn, MD;  Location: [facility];  Service: General;  Laterality: N/A;   DG SACROILIAC JOINTS ([place] [facility])  01/11/2021   [facility]   ENDOSCOPIC TURBINATE REDUCTION Bilateral 10/13/2020   Procedure: ENDOSCOPIC BILATERAL INFERIOR TURBINATE REDUCTIONS;  Surgeon: Rozetta Nunnery, MD;  Location: [facility];  Service: ENT;  Laterality: Bilateral;   ETHMOIDECTOMY Bilateral 10/13/2020   Procedure: BILATERAL TOTAL ETHMOIDECTOMY AND MAXILLARY OSTIA ENLARGEMENTS;  Surgeon: Rozetta Nunnery, MD;  Location: [facility];  Service: ENT;  Laterality: Bilateral;   LIVER BIOPSY     NASAL SINUS SURGERY Bilateral 10/13/2020   Procedure: SINUS ENDOSCOPY WITH STEALTH NAVIGATION;  Surgeon: Rozetta Nunnery, MD;  Location: [facility];  Service: ENT;  Laterality: Bilateral;   TONSILLECTOMY     Allergies  Allergen Reactions   Clarithromycin Other (See Comments)    Body cramps    Codeine Nausea And Vomiting   Tylenol [Acetaminophen] Other (See Comments)    Can not take Tylenol products due to liver   Prior to Admission medications   Medication Sig Start Date End Date Taking? Authorizing Provider  albuterol (VENTOLIN HFA) 108 (90 Base) MCG/ACT inhaler Inhale 2 puffs into the lungs  every 6 (six) hours as needed. For shortness of breath and wheezing 11/04/20  Yes Freddi Starr, MD  ARMOUR THYROID 180 MG tablet Take 1 tablet (180 mg total) by mouth daily. 10/15/20  Yes Shamleffer, Melanie Crazier, MD  atorvastatin (LIPITOR) 10 MG tablet Take 1 tablet (10 mg total) by mouth daily. 10/18/20  Yes Wendie Agreste, MD  cyclobenzaprine (FLEXERIL) 10 MG tablet Take 10 mg by mouth as needed. 12/22/20  Yes [provider]  DULoxetine (CYMBALTA) 60 MG  capsule Take 1 capsule (60 mg total) by mouth daily. Start once per day and increase to 2 po qd after 1 week if tolerated. 12/02/20  Yes Wendie Agreste, MD  FARXIGA 10 MG TABS tablet Take 10 mg by mouth daily. 12/04/20  Yes [provider]  Fluticasone-Umeclidin-Vilant (TRELEGY ELLIPTA) 100-62.5-25 MCG/ACT AEPB Inhale 1 puff into the lungs daily. 11/04/20  Yes Freddi Starr, MD  furosemide (LASIX) 20 MG tablet Take 1 tablet (20 mg total) by mouth 2 (two) times daily. 11/16/20  Yes Maximiano Coss, NP  gabapentin (NEURONTIN) 300 MG capsule Take 1 capsule by mouth as directed. 1 capsule in the morning and 2 pills at lunch and 3 pills at night 01/19/21  Yes [provider]  glipiZIDE (GLUCOTROL) 5 MG tablet Take 1 tablet (5 mg total) by mouth daily before breakfast. 02/18/21  Yes Shamleffer, Melanie Crazier, MD  hydrOXYzine (ATARAX/VISTARIL) 10 MG tablet Take 1-2 tablets (10-20 mg total) by mouth 3 (three) times daily as needed for anxiety (or sleep.). 12/02/20  Yes Wendie Agreste, MD  ibuprofen (ADVIL) 600 MG tablet Take 1 tablet (600 mg total) by mouth in the morning and at bedtime. 11/01/20  Yes Draper, Carlos Levering, DO  levocetirizine (XYZAL) 5 MG tablet Take 5 mg by mouth every evening.   Yes [provider]  lidocaine (LIDODERM) 5 % USE 1 PATCH EXTERNALLY ONCE DAILY REMOVE  AND  DISCARD  PATCH  WITH  12  HOURS  OR  AS  DIRECTED  BY  MD 08/11/20  Yes Wendie Agreste, MD  meloxicam (MOBIC) 7.5 MG tablet Take 7.5 mg by mouth daily. 01/07/21  Yes [provider]  metFORMIN (GLUCOPHAGE) 850 MG tablet Take 1 tablet (850 mg total) by mouth 2 (two) times daily with a meal. 02/25/21  Yes Shamleffer, Melanie Crazier, MD  methocarbamol (ROBAXIN) 500 MG tablet Take 500-1,000 mg by mouth as needed. 01/26/21  Yes [provider]  mometasone (NASONEX) 50 MCG/ACT nasal spray Place 1 spray into the nose daily. 1 spray per nostril daily   Yes [provider]   mupirocin ointment (BACTROBAN) 2 % Apply 1 application topically 2 (two) times daily. 01/27/21  Yes Maximiano Coss, NP  omeprazole (PRILOSEC) 40 MG capsule Take 1 capsule (40 mg total) by mouth daily. 10/20/20  Yes Jackquline Denmark, MD  potassium chloride (KLOR-CON 10) 10 MEQ tablet Take 1 tablet (10 mEq total) by mouth daily. 11/16/20  Yes Maximiano Coss, NP  traMADol (ULTRAM) 50 MG tablet Take 50-100 mg by mouth as needed. 01/29/21  Yes [provider]  XIFAXAN 550 MG TABS tablet Take 1 tablet by mouth twice daily 11/08/20  Yes Jackquline Denmark, MD  doxycycline (VIBRA-TABS) 100 MG tablet Take 1 tablet (100 mg total) by mouth 2 (two) times daily. Patient not taking: Reported on 02/18/2021 01/27/21   Maximiano Coss, NP  azelastine (ASTELIN) 0.1 % nasal spray Place 2 sprays into both nostrils 2 (two) times daily. 04/23/19  12/26/19  Ok Edwards, PA-C   Social History   Socioeconomic History   Marital status: Single    Spouse name: Not on file   Number of children: Not on file   Years of education: Not on file   Highest education level: Not on file  Occupational History   Not on file  Tobacco Use   Smoking status: Former    Packs/day: 0.50    Types: Cigarettes    Start date: 01/16/1985    Quit date: 10/08/2019    Years since quitting: 1.4   Smokeless tobacco: Never  Vaping Use   Vaping Use: Never used  Substance and Sexual Activity   Alcohol use: No   Drug use: No   Sexual activity: Not on file  Other Topics Concern   Not on file  Social History Narrative   Not on file   Social Determinants of Health   Financial Resource Strain: Not on file  Food Insecurity: Not on file  Transportation Needs: Not on file  Physical Activity: Not on file  Stress: Not on file  Social Connections: Not on file  Intimate Partner Violence: Not on file    Review of Systems Per HPI.   Objective:   Vitals:   03/02/21 1113  BP: 118/74  Pulse: 78  Resp: 16  Temp: 98 F (36.7 C)  TempSrc:  Temporal  SpO2: 96%  Weight: 253 lb 6.4 oz (114.9 kg)  Height: 5' 6"  (1.676 m)     Physical Exam Vitals reviewed.  Constitutional:      General: She is not in acute distress.    Appearance: She is well-developed.  HENT:     Head: Normocephalic and atraumatic.     Right Ear: Hearing, tympanic membrane, ear canal and external ear normal.     Left Ear: Hearing, tympanic membrane, ear canal and external ear normal.     Nose: Nose normal.     Comments: Frontal greater than maxillary sinus tenderness bilaterally.    Mouth/Throat:     Pharynx: No posterior oropharyngeal erythema.  Eyes:     Conjunctiva/sclera: Conjunctivae normal.     Pupils: Pupils are equal, round, and reactive to light.  Cardiovascular:     Rate and Rhythm: Normal rate and regular rhythm.     Heart sounds: Normal heart sounds. No murmur heard. Pulmonary:     Effort: Pulmonary effort is normal. No respiratory distress.     Breath sounds: Normal breath sounds. No wheezing or rhonchi.  Skin:    General: Skin is warm and dry.     Findings: No rash.  Neurological:     Mental Status: She is alert and oriented to person, place, and time.  Psychiatric:        Mood and Affect: Mood normal.        Behavior: Behavior normal.       Assessment & Plan:  ANNDREA MIHELICH is a 51 y.o. female . Acute sinusitis, recurrence not specified, unspecified location - Plan: doxycycline (VIBRA-TABS) 100 MG tablet -Suspect initial viral syndrome, with secondary sickening/worsening since yesterday with sinus headache, congestion, discolored nasal discharge suggestive of bacterial sinusitis.  Options discussed including Augmentin but with her underlying gastrointestinal issues she declined and would like to try doxycycline as that has been beneficial previously. We did discuss that she has been on doxycycline recently.  We will initially start doxycycline, saline nasal spray, fluids, rest.  If not improving into next week consider  quinolone antibiotic.  RTC precautions if worse.  Meds ordered this encounter  Medications   doxycycline (VIBRA-TABS) 100 MG tablet    Sig: Take 1 tablet (100 mg total) by mouth 2 (two) times daily.    Dispense:  20 tablet    Refill:  0   Patient Instructions  You likely have a viral illness that now has turned into a sinus infection  Saline nasal spray multiple times per day may help, make sure to drink plenty of fluids, rest as needed.  See information below.  Start doxycycline.  If symptoms not improving through the weekend, or any worsening sooner we may need to change antibiotics as you took doxycycline in January.  Keep me posted.  Hope you feel better soon.  Sinusitis, Adult Sinusitis is inflammation of your sinuses. Sinuses are hollow spaces in the bones around your face. Your sinuses are located: Around your eyes. In the middle of your forehead. Behind your nose. In your cheekbones. Mucus normally drains out of your sinuses. When your nasal tissues become inflamed or swollen, mucus can become trapped or blocked. This allows bacteria, viruses, and fungi to grow, which leads to infection. Most infections of the sinuses are caused by a virus. Sinusitis can develop quickly. It can last for up to 4 weeks (acute) or for more than 12 weeks (chronic). Sinusitis often develops after a cold. What are the causes? This condition is caused by anything that creates swelling in the sinuses or stops mucus from draining. This includes: Allergies. Asthma. Infection from bacteria or viruses. Deformities or blockages in your nose or sinuses. Abnormal growths in the nose (nasal polyps). Pollutants, such as chemicals or irritants in the air. Infection from fungi (rare). What increases the risk? You are more likely to develop this condition if you: Have a weak body defense system (immune system). Do a lot of swimming or diving. Overuse nasal sprays. Smoke. What are the signs or symptoms? The  main symptoms of this condition are pain and a feeling of pressure around the affected sinuses. Other symptoms include: Stuffy nose or congestion. Thick drainage from your nose. Swelling and warmth over the affected sinuses. Headache. Upper toothache. A cough that may get worse at night. Extra mucus that collects in the throat or the back of the nose (postnasal drip). Decreased sense of smell and taste. Fatigue. A fever. Sore throat. Bad breath. How is this diagnosed? This condition is diagnosed based on: Your symptoms. Your medical history. A physical exam. Tests to find out if your condition is acute or chronic. This may include: Checking your nose for nasal polyps. Viewing your sinuses using a device that has a light (endoscope). Testing for allergies or bacteria. Imaging tests, such as an MRI or CT scan. In rare cases, a bone biopsy may be done to rule out more serious types of fungal sinus disease. How is this treated? Treatment for sinusitis depends on the cause and whether your condition is chronic or acute. If caused by a virus, your symptoms should go away on their own within 10 days. You may be given medicines to relieve symptoms. They include: Medicines that shrink swollen nasal passages (topical intranasal decongestants). Medicines that treat allergies (antihistamines). A spray that eases inflammation of the nostrils (topical intranasal corticosteroids). Rinses that help get rid of thick mucus in your nose (nasal saline washes). If caused by bacteria, your health care provider may recommend waiting to see if your symptoms improve. Most bacterial infections will get better without antibiotic medicine. You  may be given antibiotics if you have: A severe infection. A weak immune system. If caused by narrow nasal passages or nasal polyps, you may need to have surgery. Follow these instructions at home: Medicines Take, use, or apply over-the-counter and prescription  medicines only as told by your health care provider. These may include nasal sprays. If you were prescribed an antibiotic medicine, take it as told by your health care provider. Do not stop taking the antibiotic even if you start to feel better. Hydrate and humidify  Drink enough fluid to keep your urine pale yellow. Staying hydrated will help to thin your mucus. Use a cool mist humidifier to keep the humidity level in your home above 50%. Inhale steam for 10-15 minutes, 3-4 times a day, or as told by your health care provider. You can do this in the bathroom while a hot shower is running. Limit your exposure to cool or dry air. Rest Rest as much as possible. Sleep with your head raised (elevated). Make sure you get enough sleep each night. General instructions  Apply a warm, moist washcloth to your face 3-4 times a day or as told by your health care provider. This will help with discomfort. Wash your hands often with soap and water to reduce your exposure to germs. If soap and water are not available, use hand sanitizer. Do not smoke. Avoid being around people who are smoking (secondhand smoke). Keep all follow-up visits as told by your health care provider. This is important. Contact a health care provider if: You have a fever. Your symptoms get worse. Your symptoms do not improve within 10 days. Get help right away if: You have a severe headache. You have persistent vomiting. You have severe pain or swelling around your face or eyes. You have vision problems. You develop confusion. Your neck is stiff. You have trouble breathing. Summary Sinusitis is soreness and inflammation of your sinuses. Sinuses are hollow spaces in the bones around your face. This condition is caused by nasal tissues that become inflamed or swollen. The swelling traps or blocks the flow of mucus. This allows bacteria, viruses, and fungi to grow, which leads to infection. If you were prescribed an antibiotic  medicine, take it as told by your health care provider. Do not stop taking the antibiotic even if you start to feel better. Keep all follow-up visits as told by your health care provider. This is important. This information is not intended to replace advice given to you by your health care provider. Make sure you discuss any questions you have with your health care provider. Document Revised: 06/04/2017 Document Reviewed: 06/04/2017 Elsevier Patient Education  2022 [place],   Merri Ray, MD [place] 03/02/21 12:15 PM

## 2021-03-02 NOTE — Patient Instructions (Signed)
You likely have a viral illness that now has turned into a sinus infection  Saline nasal spray multiple times per day may help, make sure to drink plenty of fluids, rest as needed.  See information below.  Start doxycycline.  If symptoms not improving through the weekend, or any worsening sooner we may need to change antibiotics as you took doxycycline in January.  Keep me posted.  Hope you feel better soon.  Sinusitis, Adult Sinusitis is inflammation of your sinuses. Sinuses are hollow spaces in the bones around your face. Your sinuses are located: Around your eyes. In the middle of your forehead. Behind your nose. In your cheekbones. Mucus normally drains out of your sinuses. When your nasal tissues become inflamed or swollen, mucus can become trapped or blocked. This allows bacteria, viruses, and fungi to grow, which leads to infection. Most infections of the sinuses are caused by a virus. Sinusitis can develop quickly. It can last for up to 4 weeks (acute) or for more than 12 weeks (chronic). Sinusitis often develops after a cold. What are the causes? This condition is caused by anything that creates swelling in the sinuses or stops mucus from draining. This includes: Allergies. Asthma. Infection from bacteria or viruses. Deformities or blockages in your nose or sinuses. Abnormal growths in the nose (nasal polyps). Pollutants, such as chemicals or irritants in the air. Infection from fungi (rare). What increases the risk? You are more likely to develop this condition if you: Have a weak body defense system (immune system). Do a lot of swimming or diving. Overuse nasal sprays. Smoke. What are the signs or symptoms? The main symptoms of this condition are pain and a feeling of pressure around the affected sinuses. Other symptoms include: Stuffy nose or congestion. Thick drainage from your nose. Swelling and warmth over the affected sinuses. Headache. Upper toothache. A cough that  may get worse at night. Extra mucus that collects in the throat or the back of the nose (postnasal drip). Decreased sense of smell and taste. Fatigue. A fever. Sore throat. Bad breath. How is this diagnosed? This condition is diagnosed based on: Your symptoms. Your medical history. A physical exam. Tests to find out if your condition is acute or chronic. This may include: Checking your nose for nasal polyps. Viewing your sinuses using a device that has a light (endoscope). Testing for allergies or bacteria. Imaging tests, such as an MRI or CT scan. In rare cases, a bone biopsy may be done to rule out more serious types of fungal sinus disease. How is this treated? Treatment for sinusitis depends on the cause and whether your condition is chronic or acute. If caused by a virus, your symptoms should go away on their own within 10 days. You may be given medicines to relieve symptoms. They include: Medicines that shrink swollen nasal passages (topical intranasal decongestants). Medicines that treat allergies (antihistamines). A spray that eases inflammation of the nostrils (topical intranasal corticosteroids). Rinses that help get rid of thick mucus in your nose (nasal saline washes). If caused by bacteria, your health care provider may recommend waiting to see if your symptoms improve. Most bacterial infections will get better without antibiotic medicine. You may be given antibiotics if you have: A severe infection. A weak immune system. If caused by narrow nasal passages or nasal polyps, you may need to have surgery. Follow these instructions at home: Medicines Take, use, or apply over-the-counter and prescription medicines only as told by your health care provider. These  may include nasal sprays. If you were prescribed an antibiotic medicine, take it as told by your health care provider. Do not stop taking the antibiotic even if you start to feel better. Hydrate and humidify  Drink  enough fluid to keep your urine pale yellow. Staying hydrated will help to thin your mucus. Use a cool mist humidifier to keep the humidity level in your home above 50%. Inhale steam for 10-15 minutes, 3-4 times a day, or as told by your health care provider. You can do this in the bathroom while a hot shower is running. Limit your exposure to cool or dry air. Rest Rest as much as possible. Sleep with your head raised (elevated). Make sure you get enough sleep each night. General instructions  Apply a warm, moist washcloth to your face 3-4 times a day or as told by your health care provider. This will help with discomfort. Wash your hands often with soap and water to reduce your exposure to germs. If soap and water are not available, use hand sanitizer. Do not smoke. Avoid being around people who are smoking (secondhand smoke). Keep all follow-up visits as told by your health care provider. This is important. Contact a health care provider if: You have a fever. Your symptoms get worse. Your symptoms do not improve within 10 days. Get help right away if: You have a severe headache. You have persistent vomiting. You have severe pain or swelling around your face or eyes. You have vision problems. You develop confusion. Your neck is stiff. You have trouble breathing. Summary Sinusitis is soreness and inflammation of your sinuses. Sinuses are hollow spaces in the bones around your face. This condition is caused by nasal tissues that become inflamed or swollen. The swelling traps or blocks the flow of mucus. This allows bacteria, viruses, and fungi to grow, which leads to infection. If you were prescribed an antibiotic medicine, take it as told by your health care provider. Do not stop taking the antibiotic even if you start to feel better. Keep all follow-up visits as told by your health care provider. This is important. This information is not intended to replace advice given to you by  your health care provider. Make sure you discuss any questions you have with your health care provider. Document Revised: 06/04/2017 Document Reviewed: 06/04/2017 Elsevier Patient Education  2022 [employer].

## 2021-03-08 ENCOUNTER — Encounter: Payer: Self-pay | Admitting: Gastroenterology

## 2021-03-08 ENCOUNTER — Encounter: Payer: Self-pay | Admitting: Internal Medicine

## 2021-03-08 ENCOUNTER — Other Ambulatory Visit: Payer: Self-pay | Admitting: Internal Medicine

## 2021-03-08 MED ORDER — GLIPIZIDE 5 MG PO TABS: 5.0000 mg | ORAL_TABLET | Freq: Two times a day (BID) | ORAL | 3 refills | Status: DC

## 2021-03-08 NOTE — Telephone Encounter (Signed)
Patient was denied due to not having income. They said she needs to apply for medicaid and if she is denied we need to fax the denial letter for her patient assistance to be reevaluated. I did call Christy our xifaxan rep to see about samples. I left a voicemail

## 2021-03-14 NOTE — Telephone Encounter (Signed)
Patient said she hasnt tried to do the medicaid because she woke up with allergies and I told her she needs to do it because we need a denial letter from [place]. I told her I have samples but its not much and they are at the front

## 2021-03-14 NOTE — Telephone Encounter (Signed)
Savings card in bag as well with samples

## 2021-03-25 ENCOUNTER — Telehealth: Payer: Self-pay | Admitting: Family Medicine

## 2021-03-25 NOTE — Telephone Encounter (Signed)
Pt called about the Vibra-Tabs 155m that she is taking and states that the sinus infection has come back. Pt also states that if possible, she would like to be seen by either Dr GNyoka Cowdenor RDelfino Lovett  ? ?Please advice ? ?Thank You ?

## 2021-03-25 NOTE — Telephone Encounter (Signed)
If pt wishes to see Dr Carlota Raspberry or Maximiano Coss please help her schedule an appointment, we will need to reevaluate her symptoms at that time as well  ?

## 2021-04-06 ENCOUNTER — Other Ambulatory Visit: Payer: Self-pay | Admitting: Sports Medicine

## 2021-04-06 ENCOUNTER — Other Ambulatory Visit: Payer: Self-pay | Admitting: Family Medicine

## 2021-04-06 DIAGNOSIS — F411 Generalized anxiety disorder: Secondary | ICD-10-CM

## 2021-04-08 DIAGNOSIS — Z9889 Other specified postprocedural states: Secondary | ICD-10-CM | POA: Diagnosis not present

## 2021-04-15 ENCOUNTER — Other Ambulatory Visit: Payer: Self-pay

## 2021-04-15 MED ORDER — IBUPROFEN 600 MG PO TABS: 600.0000 mg | ORAL_TABLET | Freq: Two times a day (BID) | ORAL | 0 refills | Status: DC | PRN

## 2021-04-18 ENCOUNTER — Other Ambulatory Visit (INDEPENDENT_AMBULATORY_CARE_PROVIDER_SITE_OTHER): Payer: BC Managed Care – PPO

## 2021-04-18 DIAGNOSIS — E039 Hypothyroidism, unspecified: Secondary | ICD-10-CM

## 2021-04-18 LAB — TSH: TSH: 18.82 u[IU]/mL — ABNORMAL HIGH (ref 0.35–5.50)

## 2021-04-19 ENCOUNTER — Encounter: Payer: Self-pay | Admitting: Internal Medicine

## 2021-04-19 ENCOUNTER — Other Ambulatory Visit: Payer: Self-pay

## 2021-04-19 ENCOUNTER — Telehealth: Payer: Self-pay | Admitting: Gastroenterology

## 2021-04-19 DIAGNOSIS — K7581 Nonalcoholic steatohepatitis (NASH): Secondary | ICD-10-CM

## 2021-04-19 MED ORDER — THYROID 240 MG PO TABS: 240.0000 mg | ORAL_TABLET | Freq: Every day | ORAL | 1 refills | Status: DC

## 2021-04-19 NOTE — Telephone Encounter (Signed)
Pt was scheduled for an US abdomen Complete at [facility] on April 13th at 9:00 AM:  Pt to arrive at 8:30 AM: Nothing to eat or drink past midnight: Pt made aware: Pt verbalized understanding with all questions answered.   ?

## 2021-04-19 NOTE — Telephone Encounter (Signed)
Patient called wanting to follow up on [place] that was recommended for her to have in January.  ?

## 2021-04-20 MED ORDER — LEVOTHYROXINE SODIUM 150 MCG PO TABS: 150.0000 ug | ORAL_TABLET | Freq: Every day | ORAL | 3 refills | Status: DC

## 2021-04-24 ENCOUNTER — Encounter: Payer: Self-pay | Admitting: Internal Medicine

## 2021-04-25 ENCOUNTER — Other Ambulatory Visit: Payer: Self-pay

## 2021-04-25 MED ORDER — GLIPIZIDE 5 MG PO TABS: 5.0000 mg | ORAL_TABLET | Freq: Two times a day (BID) | ORAL | 0 refills | Status: DC

## 2021-04-28 ENCOUNTER — Ambulatory Visit (HOSPITAL_COMMUNITY)
Admission: RE | Admit: 2021-04-28 | Discharge: 2021-04-28 | Disposition: A | Discharge status: Home / Self Care | Payer: BC Managed Care – PPO | Source: Ambulatory Visit | Attending: Gastroenterology | Admitting: Gastroenterology

## 2021-04-28 DIAGNOSIS — K7581 Nonalcoholic steatohepatitis (NASH): Secondary | ICD-10-CM | POA: Insufficient documentation

## 2021-04-28 DIAGNOSIS — K7460 Unspecified cirrhosis of liver: Secondary | ICD-10-CM | POA: Diagnosis not present

## 2021-05-01 NOTE — Progress Notes (Signed)
Please inform the patient. ?US-liver cirrhosis with splenomegaly.  CBD borderline likely postcholecystectomy.  No ascites ? ?On review of her blood tests the alk phos has increased (could be nonhepatic) ?Thyroid medications being adjusted ? ?Still ?-Lets recheck CBC, CMP. ?-If alk phos is elevated in future, will obtain isoenzymes ?-She needs to follow-up with me as per last note ?Send report to family physician

## 2021-05-02 ENCOUNTER — Ambulatory Visit: Payer: BC Managed Care – PPO | Admitting: Family Medicine

## 2021-05-02 ENCOUNTER — Telehealth: Payer: Self-pay | Admitting: Gastroenterology

## 2021-05-02 ENCOUNTER — Encounter: Payer: Self-pay | Admitting: Family Medicine

## 2021-05-02 VITALS — BP 128/76 | HR 68 | Temp 98.0°F | Resp 17 | Ht 66.0 in | Wt 253.6 lb

## 2021-05-02 DIAGNOSIS — F411 Generalized anxiety disorder: Secondary | ICD-10-CM | POA: Diagnosis not present

## 2021-05-02 DIAGNOSIS — R600 Localized edema: Secondary | ICD-10-CM

## 2021-05-02 DIAGNOSIS — F5104 Psychophysiologic insomnia: Secondary | ICD-10-CM | POA: Diagnosis not present

## 2021-05-02 DIAGNOSIS — J309 Allergic rhinitis, unspecified: Secondary | ICD-10-CM | POA: Diagnosis not present

## 2021-05-02 MED ORDER — DULOXETINE HCL 60 MG PO CPEP: ORAL_CAPSULE | ORAL | 1 refills | Status: DC

## 2021-05-02 MED ORDER — FUROSEMIDE 20 MG PO TABS: 20.0000 mg | ORAL_TABLET | Freq: Every day | ORAL | 0 refills | Status: DC | PRN

## 2021-05-02 MED ORDER — LEVOCETIRIZINE DIHYDROCHLORIDE 5 MG PO TABS: 5.0000 mg | ORAL_TABLET | Freq: Every evening | ORAL | 3 refills | Status: AC

## 2021-05-02 MED ORDER — TRIAMCINOLONE ACETONIDE 55 MCG/ACT NA AERO: 2.0000 | INHALATION_SPRAY | Freq: Every day | NASAL | 12 refills | Status: DC

## 2021-05-02 MED ORDER — HYDROXYZINE HCL 10 MG PO TABS: 10.0000 mg | ORAL_TABLET | Freq: Three times a day (TID) | ORAL | 1 refills | Status: DC | PRN

## 2021-05-02 NOTE — Patient Instructions (Addendum)
See info on managing anxiety.  ?I do recommend counseling. See if this option may be less costly: ?[facility] [place]: ?[address] [place] ?[phone] ? ?[facility] ?[address] [place], [place] [address] ?Monday - Friday: 8:30 a.m.-12 p.m. / 1-2:30 p.m. ?[phone] ? ?No change in medications at this time.  If you require the fluid pill more often please be seen.  Additionally if you have any chest pain or shortness of breath make sure you are seen right away. ? ?I did refill Nasacort as well as Xyzal.  If you have breakthrough symptoms or incomplete treatment with these medications I would recommend establishing with a new ear nose and throat specialist.  Let me know and I am happy to refer you. ? ?Thanks for coming in today.  Take care.  ? ?Managing Anxiety, Adult ?After being diagnosed with anxiety, you may be relieved to know why you have felt or behaved a certain way. You may also feel overwhelmed about the treatment ahead and what it will mean for your life. With care and support, you can manage this condition. ?How to manage lifestyle changes ?Managing stress and anxiety ? ?Stress is your body's reaction to life changes and events, both good and bad. Most stress will last just a few hours, but stress can be ongoing and can lead to more than just stress. Although stress can play a major role in anxiety, it is not the same as anxiety. Stress is usually caused by something external, such as a deadline, test, or competition. Stress normally passes after the triggering event has ended.  ?Anxiety is caused by something internal, such as imagining a terrible outcome or worrying that something will go wrong that will devastate you. Anxiety often does not go away even after the triggering event is over, and it can become long-term (chronic) worry. It is important to understand the differences between stress and anxiety and to manage your stress effectively so that it does not  lead to an anxious response. ?Talk with your health care provider or a counselor to learn more about reducing anxiety and stress. He or she may suggest tension reduction techniques, such as: ?Music therapy. Spend time creating or listening to music that you enjoy and that inspires you. ?Mindfulness-based meditation. Practice being aware of your normal breaths while not trying to control your breathing. It can be done while sitting or walking. ?Centering prayer. This involves focusing on a word, phrase, or sacred image that means something to you and brings you peace. ?Deep breathing. To do this, expand your stomach and inhale slowly through your nose. Hold your breath for 3-5 seconds. Then exhale slowly, letting your stomach muscles relax. ?Self-talk. Learn to notice and identify thought patterns that lead to anxiety reactions and change those patterns to thoughts that feel peaceful. ?Muscle relaxation. Taking time to tense muscles and then relax them. ?Choose a tension reduction technique that fits your lifestyle and personality. These techniques take time and practice. Set aside 5-15 minutes a day to do them. Therapists can offer counseling and training in these techniques. The training to help with anxiety may be covered by some insurance plans. ?Other things you can do to manage stress and anxiety include: ?Keeping a stress diary. This can help you learn what triggers your reaction and then learn ways to manage your response. ?Thinking about how you react to certain situations. You may not be able to control everything, but you can control your response. ?Making  time for activities that help you relax and not feeling guilty about spending your time in this way. ?Doing visual imagery. This involves imagining or creating mental pictures to help you relax. ?Practicing yoga. Through yoga poses, you can lower tension and promote relaxation. ? ?Medicines ?Medicines can help ease symptoms. Medicines for anxiety  include: ?Antidepressant medicines. These are usually prescribed for long-term daily control. ?Anti-anxiety medicines. These may be added in severe cases, especially when panic attacks occur. ?Medicines will be prescribed by a health care provider. When used together, medicines, psychotherapy, and tension reduction techniques may be the most effective treatment. ?Relationships ?Relationships can play a big part in helping you recover. Try to spend more time connecting with trusted friends and family members. ?Consider going to couples counseling if you have a partner, taking family education classes, or going to family therapy. ?Therapy can help you and others better understand your condition. ?How to recognize changes in your anxiety ?Everyone responds differently to treatment for anxiety. Recovery from anxiety happens when symptoms decrease and stop interfering with your daily activities at home or work. This may mean that you will start to: ?Have better concentration and focus. Worry will interfere less in your daily thinking. ?Sleep better. ?Be less irritable. ?Have more energy. ?Have improved memory. ?It is also important to recognize when your condition is getting worse. Contact your health care provider if your symptoms interfere with home or work and you feel like your condition is not improving. ?Follow these instructions at home: ?Activity ?Exercise. Adults should do the following: ?Exercise for at least 150 minutes each week. The exercise should increase your heart rate and make you sweat (moderate-intensity exercise). ?Strengthening exercises at least twice a week. ?Get the right amount and quality of sleep. Most adults need 7-9 hours of sleep each night. ?Lifestyle ? ?Eat a healthy diet that includes plenty of vegetables, fruits, whole grains, low-fat dairy products, and lean protein. ?Do not eat a lot of foods that are high in fats, added sugars, or salt (sodium). ?Make choices that simplify your  life. ?Do not use any products that contain nicotine or tobacco. These products include cigarettes, chewing tobacco, and vaping devices, such as e-cigarettes. If you need help quitting, ask your health care provider. ?Avoid caffeine, alcohol, and certain over-the-counter cold medicines. These may make you feel worse. Ask your pharmacist which medicines to avoid. ?General instructions ?Take over-the-counter and prescription medicines only as told by your health care provider. ?Keep all follow-up visits. This is important. ?Where to find support ?You can get help and support from these sources: ?Self-help groups. ?Online and [employer]. ?A trusted spiritual leader. ?Couples counseling. ?Family education classes. ?Family therapy. ?Where to find more information ?You may find that joining a support group helps you deal with your anxiety. The following sources can help you locate counselors or support groups near you: ?[place]: www.mentalhealthamerica.net ?Anxiety and Depression Association of America (ADAA): https://www.clark.net/ ?National Alliance on Mental Illness (NAMI): www.nami.org ?Contact a health care provider if: ?You have a hard time staying focused or finishing daily tasks. ?You spend many hours a day feeling worried about everyday life. ?You become exhausted by worry. ?You start to have headaches or frequently feel tense. ?You develop chronic nausea or diarrhea. ?Get help right away if: ?You have a racing heart and shortness of breath. ?You have thoughts of hurting yourself or others. ?If you ever feel like you may hurt yourself or others, or have thoughts about taking your own life,  get help right away. Go to your nearest emergency department or: ?Call your local emergency services (911 in the [place] ?Call a suicide crisis helpline, such as the [place] at 413-102-6344 or 988 in the [place]. This is open 24 hours a day in the [place] ?Text the Crisis Text Line at  [phone] (in the [place].). ?Summary ?Taking steps to learn and use tension reduction techniques can help calm you and help prevent triggering an anxiety reaction. ?When used together, medicines, psychotherapy, and tension r

## 2021-05-02 NOTE — Progress Notes (Signed)
? ?Subjective:  ?Patient ID: Teresa Castaneda, female    DOB: 1970/12/29  Age: 51 y.o. MRN: [id number] ?6 ?Medications diabetes Dr. Carlota Raspberry. ?CC:  ?Chief Complaint  ?Patient presents with  ? Anxiety  ?  Has been doing well notes only take at home due to drowsy effects PHQ9=12 GAD7=11  ? Edema  ?  Pt in need of refill lasix for ankle swelling takes PRN and has worked very well.   ? Allergies  ?  Pt needs refill Nasacort as well as xyzal due to ENT retiring   ? ? ?HPI ?Teresa Castaneda presents for  ? ?Anxiety/Depression, insomnia.  ?With adjustment component with health issues, and with chronic pain - tx with Cymbalta 115m qd. Improved at 11/2020 visit. Changed form Lexapro as minimal relief at higher dose.  ?Hydroxyzine for sleep/anxiety flairs-infrequent use. Not daily. Some sedation when taking - feels weird.  ?Uncle passed away last week.  ?Doing ok. Still some anxiety flairs, but better than she was.  Lost job. Unable to afford counseling. ?Exercise - walking dog.  ?No SI/HI.  ?Declines referral to psychiatry at this time.  ? ?Pedal edema: ?Episodic use of furosemide has been effective.  Needs refill. 2-3 times per week only for leg swelling. No CP/dyspnea. No added salt. Low salt diet.  Cbc, cmp planned by GI from recent [facility].  ? ?Allergic rhinitis. ?Effectively treated with Nasacort, Xyzal previously.  Has been treated by ENT prior - retired. Prior sinus surgery. Needs refill of meds. ? ?History ?Patient Active Problem List  ? Diagnosis Date Noted  ? Cirrhosis of liver ([facility] 06/23/2020  ? Symptomatic cholelithiasis 05/10/2020  ? Acquired hypothyroidism 04/05/2020  ? Weight gain 04/05/2020  ? Type 2 diabetes mellitus with hyperglycemia, without long-term current use of insulin ([facility] 04/05/2020  ? History of COVID-19 09/23/2019  ? Pneumonia due to COVID-19 virus 09/23/2019  ? Cough 09/23/2019  ? BMI 40.0-44.9, adult ([facility] 07/22/2019  ? Facet arthropathy, lumbar 07/22/2019  ? Sacroiliitis ([facility] 07/22/2019   ? ANXIETY 09/10/2006  ? DEPRESSION 09/10/2006  ? ASTHMA 09/10/2006  ? HEADACHE 09/10/2006  ? ?Past Medical History:  ?Diagnosis Date  ? Allergies   ? Anxiety   ? Arthritis   ? Asthma   ? Back pain   ? Spinal Issue   ? Diabetes mellitus without complication ([facility]   ? DJD (degenerative joint disease), lumbar   ? Elevated cholesterol   ? Gallstone   ? GERD (gastroesophageal reflux disease)   ? Liver cirrhosis secondary to NASH ([facility]   ? Neuromuscular disorder ([facility]   ? Obesity   ? Oxygen deficiency   ? Thyroid disease   ? ?Past Surgical History:  ?Procedure Laterality Date  ? ABDOMINAL HYSTERECTOMY  02/21/2003  ? CHOLECYSTECTOMY N/A 05/10/2020  ? Procedure: LAPAROSCOPIC CHOLECYSTECTOMY WITH INTRAOPERATIVE CHOLANGIOGRAM AND LIVER BIOPSY;  Surgeon: SFelicie Morn MD;  Location: [facility];  Service: General;  Laterality: N/A;  ? DG SACROILIAC JOINTS ([facility])  01/11/2021  ? [facility]  ? ENDOSCOPIC TURBINATE REDUCTION Bilateral 10/13/2020  ? Procedure: ENDOSCOPIC BILATERAL INFERIOR TURBINATE REDUCTIONS;  Surgeon: NRozetta Nunnery MD;  [facility]  Service: ENT;  Laterality: Bilateral;  ? ETHMOIDECTOMY Bilateral 10/13/2020  ? Procedure: BILATERAL TOTAL ETHMOIDECTOMY AND MAXILLARY OSTIA ENLARGEMENTS;  Surgeon: NRozetta Nunnery MD;  [facility]  Service: ENT;  Laterality: Bilateral;  ? LIVER BIOPSY    ? NASAL SINUS SURGERY Bilateral 10/13/2020  ? Procedure:  SINUS ENDOSCOPY WITH STEALTH NAVIGATION;  Surgeon: Rozetta Nunnery, MD;  Location: [facility];  Service: ENT;  Laterality: Bilateral;  ? TONSILLECTOMY    ? ?Allergies  ?Allergen Reactions  ? Clarithromycin Other (See Comments)  ?  Body cramps   ? Codeine Nausea And Vomiting  ? Tylenol [Acetaminophen] Other (See Comments)  ?  Can not take Tylenol products due to liver  ? ?Prior to Admission medications   ?Medication Sig Start Date End Date Taking? Authorizing Provider   ?albuterol (VENTOLIN HFA) 108 (90 Base) MCG/ACT inhaler Inhale 2 puffs into the lungs every 6 (six) hours as needed. For shortness of breath and wheezing 11/04/20  Yes Freddi Starr, MD  ?atorvastatin (LIPITOR) 10 MG tablet Take 1 tablet (10 mg total) by mouth daily. 10/18/20  Yes Wendie Agreste, MD  ?cyclobenzaprine (FLEXERIL) 10 MG tablet Take 10 mg by mouth as needed. 12/22/20  Yes [provider]  ?DULoxetine (CYMBALTA) 60 MG capsule TAKE 1 CAPSULE BY MOUTH ONCE DAILY AND  INCREASE  TO  2 CAPSULES  EVERY  DAY  AFTER  1  WEEK  IF  TOLERATED 04/06/21  Yes Wendie Agreste, MD  ?FARXIGA 10 MG TABS tablet Take 10 mg by mouth daily. 12/04/20  Yes [provider]  ?Fluticasone-Umeclidin-Vilant (TRELEGY ELLIPTA) 100-62.5-25 MCG/ACT AEPB Inhale 1 puff into the lungs daily. 11/04/20  Yes Freddi Starr, MD  ?furosemide (LASIX) 20 MG tablet Take 1 tablet (20 mg total) by mouth 2 (two) times daily. 11/16/20  Yes Maximiano Coss, NP  ?gabapentin (NEURONTIN) 300 MG capsule Take 1 capsule by mouth as directed. 1 capsule in the morning and 2 pills at lunch and 3 pills at night 01/19/21  Yes [provider]  ?glipiZIDE (GLUCOTROL) 5 MG tablet Take 1 tablet (5 mg total) by mouth 2 (two) times daily before a meal. 04/25/21  Yes Shamleffer, Melanie Crazier, MD  ?hydrOXYzine (ATARAX/VISTARIL) 10 MG tablet Take 1-2 tablets (10-20 mg total) by mouth 3 (three) times daily as needed for anxiety (or sleep.). 12/02/20  Yes Wendie Agreste, MD  ?ibuprofen (ADVIL) 600 MG tablet Take 1 tablet (600 mg total) by mouth 2 (two) times daily as needed. 04/15/21  Yes Lilia Argue R, DO  ?levocetirizine (XYZAL) 5 MG tablet Take 5 mg by mouth every evening.   Yes [provider]  ?levothyroxine (SYNTHROID) 150 MCG tablet Take 1 tablet (150 mcg total) by mouth daily. 04/20/21  Yes Shamleffer, Melanie Crazier, MD  ?lidocaine (LIDODERM) 5 % USE 1 PATCH EXTERNALLY ONCE DAILY REMOVE  AND  DISCARD  PATCH  WITH   12  HOURS  OR  AS  DIRECTED  BY  MD 08/11/20  Yes Wendie Agreste, MD  ?metFORMIN (GLUCOPHAGE) 850 MG tablet Take 1 tablet (850 mg total) by mouth 2 (two) times daily with a meal. 02/25/21  Yes Shamleffer, Melanie Crazier, MD  ?mometasone (NASONEX) 50 MCG/ACT nasal spray Place 1 spray into the nose daily. 1 spray per nostril daily   Yes [provider]  ?omeprazole (PRILOSEC) 40 MG capsule Take 1 capsule (40 mg total) by mouth daily. 10/20/20  Yes Jackquline Denmark, MD  ?Doreene Nest 550 MG TABS tablet Take 1 tablet by mouth twice daily 11/08/20  Yes Jackquline Denmark, MD  ?azelastine (ASTELIN) 0.1 % nasal spray Place 2 sprays into both nostrils 2 (two) times daily. 04/23/19 12/26/19  Ok Edwards, PA-C  ? ?Social History  ? ?Socioeconomic History  ? Marital status:  Single  ?  Spouse name: Not on file  ? Number of children: Not on file  ? Years of education: Not on file  ? Highest education level: Not on file  ?Occupational History  ? Not on file  ?Tobacco Use  ? Smoking status: Former  ?  Packs/day: 0.50  ?  Types: Cigarettes  ?  Start date: 01/16/1985  ?  Quit date: 10/08/2019  ?  Years since quitting: 1.5  ? Smokeless tobacco: Never  ?Vaping Use  ? Vaping Use: Never used  ?Substance and Sexual Activity  ? Alcohol use: No  ? Drug use: No  ? Sexual activity: Not on file  ?Other Topics Concern  ? Not on file  ?Social History Narrative  ? Not on file  ? ?Social Determinants of Health  ? ?Financial Resource Strain: Not on file  ?Food Insecurity: Not on file  ?Transportation Needs: Not on file  ?Physical Activity: Not on file  ?Stress: Not on file  ?Social Connections: Not on file  ?Intimate Partner Violence: Not on file  ? ? ?Review of Systems ?Per hpi.  ? ?Objective:  ? ?Vitals:  ? 05/02/21 0838  ?BP: 128/76  ?Pulse: 68  ?Resp: 17  ?Temp: 98 ?F (36.7 ?C)  ?TempSrc: Temporal  ?SpO2: 99%  ?Weight: 253 lb 9.6 oz (115 kg)  ?Height: 5' 6"  (1.676 m)  ? ? ? ?Physical Exam ?Vitals reviewed.  ?Constitutional:   ?   Appearance: Normal  appearance. She is well-developed.  ?HENT:  ?   Head: Normocephalic and atraumatic.  ?Eyes:  ?   Conjunctiva/sclera: Conjunctivae normal.  ?   Pupils: Pupils are equal, round, and reactive to light.  ?Neck:  ?

## 2021-05-02 NOTE — Telephone Encounter (Signed)
Patient called to discuss ultrasound results from 04/28/21. Please advise. ?

## 2021-05-03 ENCOUNTER — Other Ambulatory Visit: Payer: Self-pay

## 2021-05-03 DIAGNOSIS — K7581 Nonalcoholic steatohepatitis (NASH): Secondary | ICD-10-CM

## 2021-05-03 NOTE — Telephone Encounter (Signed)
Pt was made aware of recent results and Dr. Steve Rattler recommendation: ?Orders for labs placed in Epic: Pt made aware: Pt given directions to Lab: ?Report was sent to pt PCP: ?Pt notified to call our office in 1 month to schedule a 6 month follow up with Dr. Lyndel Safe  which will be at the end of July;  ?Pt verbalized understanding with all questions answered ?

## 2021-05-10 ENCOUNTER — Other Ambulatory Visit (INDEPENDENT_AMBULATORY_CARE_PROVIDER_SITE_OTHER): Payer: BC Managed Care – PPO

## 2021-05-10 DIAGNOSIS — K7581 Nonalcoholic steatohepatitis (NASH): Secondary | ICD-10-CM | POA: Diagnosis not present

## 2021-05-10 LAB — COMPREHENSIVE METABOLIC PANEL
ALT: 29 U/L (ref 0–35)
AST: 32 U/L (ref 0–37)
Albumin: 3.7 g/dL (ref 3.5–5.2)
Alkaline Phosphatase: 98 U/L (ref 39–117)
BUN: 7 mg/dL (ref 6–23)
CO2: 29 mEq/L (ref 19–32)
Calcium: 9.1 mg/dL (ref 8.4–10.5)
Chloride: 99 mEq/L (ref 96–112)
Creatinine, Ser: 0.52 mg/dL (ref 0.40–1.20)
GFR: 109.08 mL/min (ref >60.00)
Glucose, Bld: 216 mg/dL — ABNORMAL HIGH (ref 70–99)
Potassium: 4.1 mEq/L (ref 3.5–5.1)
Sodium: 136 mEq/L (ref 135–145)
Total Bilirubin: 0.5 mg/dL (ref 0.2–1.2)
Total Protein: 7.7 g/dL (ref 6.0–8.3)

## 2021-05-10 LAB — CBC WITH DIFFERENTIAL/PLATELET
Basophils Absolute: 0.1 10*3/uL (ref 0.0–0.1)
Basophils Relative: 1.1 % (ref 0.0–3.0)
Eosinophils Absolute: 0.3 10*3/uL (ref 0.0–0.7)
Eosinophils Relative: 4 % (ref 0.0–5.0)
HCT: 37 % (ref 36.0–46.0)
Hemoglobin: 11.9 g/dL — ABNORMAL LOW (ref 12.0–15.0)
Lymphocytes Relative: 27.5 % (ref 12.0–46.0)
Lymphs Abs: 2.4 10*3/uL (ref 0.7–4.0)
MCHC: 32.1 g/dL (ref 30.0–36.0)
MCV: 73.6 fl — ABNORMAL LOW (ref 78.0–100.0)
Monocytes Absolute: 0.6 10*3/uL (ref 0.1–1.0)
Monocytes Relative: 6.6 % (ref 3.0–12.0)
Neutro Abs: 5.3 10*3/uL (ref 1.4–7.7)
Neutrophils Relative %: 60.8 % (ref 43.0–77.0)
Platelets: 150 10*3/uL (ref 150.0–400.0)
RBC: 5.03 Mil/uL (ref 3.87–5.11)
RDW: 17.9 % — ABNORMAL HIGH (ref 11.5–15.5)
WBC: 8.7 10*3/uL (ref 4.0–10.5)

## 2021-06-20 ENCOUNTER — Encounter: Payer: Self-pay | Admitting: Internal Medicine

## 2021-06-20 ENCOUNTER — Ambulatory Visit (INDEPENDENT_AMBULATORY_CARE_PROVIDER_SITE_OTHER): Payer: BC Managed Care – PPO | Admitting: Internal Medicine

## 2021-06-20 VITALS — BP 126/80 | HR 100 | Ht 66.0 in | Wt 259.0 lb

## 2021-06-20 DIAGNOSIS — E1140 Type 2 diabetes mellitus with diabetic neuropathy, unspecified: Secondary | ICD-10-CM

## 2021-06-20 DIAGNOSIS — E039 Hypothyroidism, unspecified: Secondary | ICD-10-CM

## 2021-06-20 DIAGNOSIS — E1165 Type 2 diabetes mellitus with hyperglycemia: Secondary | ICD-10-CM | POA: Diagnosis not present

## 2021-06-20 DIAGNOSIS — M792 Neuralgia and neuritis, unspecified: Secondary | ICD-10-CM | POA: Diagnosis not present

## 2021-06-20 LAB — POCT GLYCOSYLATED HEMOGLOBIN (HGB A1C): Hemoglobin A1C: 8.8 % — AB (ref 4.0–5.6)

## 2021-06-20 LAB — POCT GLUCOSE (DEVICE FOR HOME USE): Glucose Fasting, POC: 254 mg/dL — AB (ref 70–99)

## 2021-06-20 LAB — TSH: TSH: 3.88 u[IU]/mL (ref 0.35–5.50)

## 2021-06-20 MED ORDER — GLIPIZIDE 5 MG PO TABS: 5.0000 mg | ORAL_TABLET | Freq: Two times a day (BID) | ORAL | 3 refills | Status: DC

## 2021-06-20 MED ORDER — GLIPIZIDE 5 MG PO TABS: ORAL_TABLET | ORAL | 3 refills | Status: DC

## 2021-06-20 NOTE — Progress Notes (Signed)
Name: Teresa Castaneda  Age/ Sex: 51 y.o., female   MRN/ DOB: [id number], Nov 19, 1970     PCP: Wendie Agreste, MD   Reason for Endocrinology Evaluation: Type 2 Diabetes Mellitus  Initial Endocrine Consultative Visit: 04/05/2020    PATIENT IDENTIFIER: Teresa Castaneda is a 51 y.o. female with a past medical history of T2DM, cirrhosis  and Hypothyroidism. The patient has followed with Endocrinology clinic since 04/05/2020 for consultative assistance with management of her diabetes.  DIABETIC HISTORY:  [name] was diagnosed with DM in 08/2019, Metformin started 08/2019. Her hemoglobin A1c has ranged from 8.1% in 2022, peaking at 9.8% in 2022. Has mild upset stomach since increasing metformin but its transient    Glipizide started 02/2021    THYROID HISTORY: She has been diagnosed with hypothyroidism ~ 5 yrs ago No prior sx or radiation to the neck She was on Levothyroxine in he past but that didn't  work   Pt is on Armour thyroid 180 mg daily and 15 mg daily , we stopped 50 mg of Armour Thyroid which normalized her TSH.  Switched from armour to levothyroxine 04/2021 due to cost   SUBJECTIVE:   During the last visit (02/18/2021): A1c 9.4% Started Glipizide, continued metformin and farxiga   Today (06/20/2021): Teresa Castaneda  is here for a follow up on diabetes and hypothyroid management. She checks her blood sugars 3 x daily  . The patient has not had hypoglycemic episodes since the last clinic visit but endorses subjective hypoglycemia with Bg's 100s around 1 pm    She denies constipation or diarrhea  Has been bloating  She has insomnia due to chronic feet pain , she was seeing a neurologist in [place]. She is on Cymbalta and Gabapentin       HOME ENDOCRINE  REGIMEN:  Metformin 850 mg BID  Farxiga 10 mg daily  Glipizide 5 mg daily  Levothyroxine 150 mg daily    Statin: Yes ACE-I/ARB: No Prior Diabetic Education: No   METER DOWNLOAD SUMMARY: n/a   DIABETIC  COMPLICATIONS: Microvascular complications:  Neuropathy Denies: CKD, retinopathy Last Eye Exam: Completed 03/2020  Macrovascular complications:   Denies: CAD, CVA, PVD   HISTORY:  Past Medical History:  Past Medical History:  Diagnosis Date   Allergies    Anxiety    Arthritis    Asthma    Back pain    Spinal Issue    Diabetes mellitus without complication (HCC)    DJD (degenerative joint disease), lumbar    Elevated cholesterol    Gallstone    GERD (gastroesophageal reflux disease)    Liver cirrhosis secondary to NASH ([place])    Neuromuscular disorder ([place])    Obesity    Oxygen deficiency    Thyroid disease    Past Surgical History:  Past Surgical History:  Procedure Laterality Date   ABDOMINAL HYSTERECTOMY  02/21/2003   CHOLECYSTECTOMY N/A 05/10/2020   Procedure: LAPAROSCOPIC CHOLECYSTECTOMY WITH INTRAOPERATIVE CHOLANGIOGRAM AND LIVER BIOPSY;  Surgeon: Felicie Morn, MD;  Location: [facility];  Service: General;  Laterality: N/A;   DG SACROILIAC JOINTS ([place] HX)  01/11/2021   [facility]   ENDOSCOPIC TURBINATE REDUCTION Bilateral 10/13/2020   Procedure: ENDOSCOPIC BILATERAL INFERIOR TURBINATE REDUCTIONS;  Surgeon: Rozetta Nunnery, MD;  Location: [place];  Service: ENT;  Laterality: Bilateral;   ETHMOIDECTOMY Bilateral 10/13/2020   Procedure: BILATERAL TOTAL ETHMOIDECTOMY AND MAXILLARY OSTIA ENLARGEMENTS;  Surgeon: Rozetta Nunnery, MD;  Location: [place];  Service: ENT;  Laterality: Bilateral;   LIVER BIOPSY     NASAL SINUS SURGERY Bilateral 10/13/2020   Procedure: SINUS ENDOSCOPY WITH STEALTH NAVIGATION;  Surgeon: Rozetta Nunnery, MD;  Location: [place];  Service: ENT;  Laterality: Bilateral;   TONSILLECTOMY     Social History:  reports that she quit smoking about 20 months ago. Her smoking use included cigarettes. She started smoking about 36 years ago. She smoked an average of .5 packs  per day. She has never used smokeless tobacco. She reports that she does not drink alcohol and does not use drugs. Family History:  Family History  Problem Relation Age of Onset   Coronary artery disease Mother    Diabetes Mother    COPD Mother    Coronary artery disease Father    Hypertension Father    Colon cancer Neg Hx    Esophageal cancer Neg Hx      HOME MEDICATIONS: Allergies as of 06/20/2021       Reactions   Clarithromycin Other (See Comments)   Body cramps    Codeine Nausea And Vomiting   Tylenol [acetaminophen] Other (See Comments)   Can not take Tylenol products due to liver        Medication List        Accurate as of June 20, 2021  9:04 AM. If you have any questions, ask your nurse or doctor.          albuterol 108 (90 Base) MCG/ACT inhaler Commonly known as: VENTOLIN HFA Inhale 2 puffs into the lungs every 6 (six) hours as needed. For shortness of breath and wheezing   atorvastatin 10 MG tablet Commonly known as: LIPITOR Take 1 tablet (10 mg total) by mouth daily.   cyclobenzaprine 10 MG tablet Commonly known as: FLEXERIL Take 10 mg by mouth as needed.   DULoxetine 60 MG capsule Commonly known as: CYMBALTA TAKE 2 CAPSULE BY MOUTH ONCE DAILY   Farxiga 10 MG Tabs tablet Generic drug: dapagliflozin propanediol Take 10 mg by mouth daily.   furosemide 20 MG tablet Commonly known as: LASIX Take 1 tablet (20 mg total) by mouth daily as needed.   gabapentin 300 MG capsule Commonly known as: NEURONTIN Take 1 capsule by mouth as directed. 1 capsule in the morning and 2 pills at lunch and 3 pills at night   glipiZIDE 5 MG tablet Commonly known as: GLUCOTROL Take 1 tablet (5 mg total) by mouth daily before breakfast AND 2 tablets (10 mg total) daily before supper. What changed: See the new instructions. Changed by: Dorita Sciara, MD   hydrOXYzine 10 MG tablet Commonly known as: ATARAX Take 1-2 tablets (10-20 mg total) by mouth 3 (three)  times daily as needed for anxiety (or sleep.).   ibuprofen 600 MG tablet Commonly known as: ADVIL Take 1 tablet (600 mg total) by mouth 2 (two) times daily as needed.   levocetirizine 5 MG tablet Commonly known as: XYZAL Take 1 tablet (5 mg total) by mouth every evening.   levothyroxine 150 MCG tablet Commonly known as: SYNTHROID Take 1 tablet (150 mcg total) by mouth daily.   lidocaine 5 % Commonly known as: LIDODERM USE 1 PATCH EXTERNALLY ONCE DAILY REMOVE  AND  DISCARD  PATCH  WITH  12  HOURS  OR  AS  DIRECTED  BY  MD   metFORMIN 850 MG tablet Commonly known as: Glucophage Take 1 tablet (850 mg total) by mouth 2 (two) times daily with  a meal.   omeprazole 40 MG capsule Commonly known as: PRILOSEC Take 1 capsule (40 mg total) by mouth daily.   Trelegy Ellipta 100-62.5-25 MCG/ACT Aepb Generic drug: Fluticasone-Umeclidin-Vilant Inhale 1 puff into the lungs daily.   triamcinolone 55 MCG/ACT Aero nasal inhaler Commonly known as: NASACORT Place 2 sprays into the nose daily.   Xifaxan 550 MG Tabs tablet Generic drug: rifaximin Take 1 tablet by mouth twice daily         OBJECTIVE:   Vital Signs: BP 126/80 (BP Location: Left Arm, Patient Position: Sitting, Cuff Size: Large)   Pulse 100   Ht 5' 6"  (1.676 m)   Wt 259 lb (117.5 kg)   SpO2 97%   BMI 41.80 kg/m   Wt Readings from Last 3 Encounters:  06/20/21 259 lb (117.5 kg)  05/02/21 253 lb 9.6 oz (115 kg)  03/02/21 253 lb 6.4 oz (114.9 kg)     Exam: General: Pt appears well and is in NAD  Neck: General: Supple without adenopathy. Thyroid:  No goiter or nodules appreciated.   Lungs: Clear with good BS bilat with no rales, rhonchi, or wheezes  Heart: RRR   Extremities: Trace edema   Neuro: MS is good with appropriate affect, pt is alert and Ox3   DM Foot Exam 02/18/2021 The skin of the feet is intact without sores or ulcerations. The pedal pulses are 2+ on right and 2+ on left. The sensation is absent to a  screening 5.07, 10 gram monofilament bilaterally   DATA REVIEWED:   Lab Results  Component Value Date   HGBA1C 8.8 (A) 06/20/2021   HGBA1C 9.4 (A) 02/18/2021   HGBA1C 8.1 (A) 10/15/2020    Latest Reference Range & Units 02/18/21 09:18  Total CHOL/HDL Ratio  3  Cholesterol 0 - 200 mg/dL 126  HDL Cholesterol >39.00 mg/dL 47.60  LDL (calc) 0 - 99 mg/dL 63  NonHDL  78.28  Triglycerides 0.0 - 149.0 mg/dL 77.0  VLDL 0.0 - 40.0 mg/dL 15.4    Latest Reference Range & Units 02/18/21 09:18  TSH 0.35 - 5.50 uIU/mL 41.10 (H)    Latest Reference Range & Units 02/11/21 09:36  COMPREHENSIVE METABOLIC PANEL  Rpt !  Sodium 135 - 145 mEq/L 137  Potassium 3.5 - 5.1 mEq/L 4.3  Chloride 96 - 112 mEq/L 100  CO2 19 - 32 mEq/L 27  Glucose 70 - 99 mg/dL 165 (H)  BUN 6 - 23 mg/dL 11  Creatinine 0.40 - 1.20 mg/dL 0.61  Calcium 8.4 - 10.5 mg/dL 8.9  Alkaline Phosphatase 39 - 117 U/L 123 (H)  Albumin 3.5 - 5.2 g/dL 3.8  AST 0 - 37 U/L 29  ALT 0 - 35 U/L 30  Total Protein 6.0 - 8.3 g/dL 7.2    ASSESSMENT / PLAN / RECOMMENDATIONS:   1) Type 2 Diabetes Mellitus, Poorly  controlled, With out complications - Most recent A1c of 8.8 %. Goal A1c < 7.0 %.    - Pt with slight improvement in glycemic control but continues to be above goal. Bg in the office 258 mg/dL with just drinking black coffee - Intolerant to higher doses of Metformin  -Will increase Glipizide, pt would like to avoid insulin  -She does endorse subjective hypoglycemia around 1 PM, hence I am not going to increase her morning dose of glipizide    MEDICATIONS: Continue Metformin 850 mg, 1 tablet with Breakfast and Supper  Continue Farxiga 10  mg daily  Change Glipizide 5 mg ,  1 tablet before Breakfast and 2 tabs before Supper    EDUCATION / INSTRUCTIONS: BG monitoring instructions: Patient is instructed to check her blood sugars 1 times a day, fasting. Call [facility] if: BG persistently < 70  I reviewed the  Rule of 15 for the treatment of hypoglycemia in detail with the patient. Literature supplied.    2) Diabetic complications:  Eye: Does not have known diabetic retinopathy.  Neuro/ Feet: Does not have known diabetic peripheral neuropathy .  Renal: Patient does not have known baseline CKD. She   is not on an ACEI/ARB at present.    3) Hypothyroidism:   -TSH is normal  Medication  Continue levothyroxine 150 mg daily   4) Peripheral Neuropathy:  - Pt continues with neuropathic pain . She is on Cymbalta and Gabapentin. She used to follow up with neurology in [place] but would like to transition locally, a referral has been placed   - She was advised to use OTC Capsaicin cream PRN   F/U in 4 months   Signed electronically by: Mack Guise, MD  [facility] End[facility]  [facility] [address] [place], [place] [address] Phone: [phone] FAX: [phone]   CC: Wendie Agreste, MD [address] [place] [address] Phone: [phone]  Fax: [phone]  Return to Endocrinology clinic as below: Future Appointments  Date Time Provider [place]  08/08/2021  9:00 AM Freddi Starr, MD [facility] None  11/04/2021  8:00 AM Wendie Agreste, MD [id number]

## 2021-06-20 NOTE — Patient Instructions (Addendum)
-   Continue Metformin 850 mg, 1 tablet with Breakfast and Supper  - Continue Farxiga 10 mg daily  - Increase Glipizide 5 mg,  1 tablet before breakfast and 2 tablets before supper      HOW TO TREAT LOW BLOOD SUGARS (Blood sugar LESS THAN 70 MG/DL) Please follow the RULE OF 15 for the treatment of hypoglycemia treatment (when your (blood sugars are less than 70 mg/dL)   STEP 1: Take 15 grams of carbohydrates when your blood sugar is low, which includes:  3-4 GLUCOSE TABS  OR 3-4 OZ OF JUICE OR REGULAR SODA OR ONE TUBE OF GLUCOSE GEL    STEP 2: RECHECK blood sugar in 15 MINUTES STEP 3: If your blood sugar is still low at the 15 minute recheck --> then, go back to STEP 1 and treat AGAIN with another 15 grams of carbohydrates.    Capsaicin cream

## 2021-06-25 ENCOUNTER — Other Ambulatory Visit: Payer: Self-pay | Admitting: Family Medicine

## 2021-06-25 DIAGNOSIS — R600 Localized edema: Secondary | ICD-10-CM

## 2021-06-27 ENCOUNTER — Encounter: Payer: Self-pay | Admitting: Internal Medicine

## 2021-06-27 ENCOUNTER — Encounter: Payer: Self-pay | Admitting: Neurology

## 2021-07-14 ENCOUNTER — Encounter: Payer: Self-pay | Admitting: Internal Medicine

## 2021-07-14 ENCOUNTER — Other Ambulatory Visit: Payer: Self-pay | Admitting: Internal Medicine

## 2021-07-14 ENCOUNTER — Telehealth: Payer: Self-pay | Admitting: Gastroenterology

## 2021-07-14 DIAGNOSIS — E1140 Type 2 diabetes mellitus with diabetic neuropathy, unspecified: Secondary | ICD-10-CM

## 2021-07-14 DIAGNOSIS — E1165 Type 2 diabetes mellitus with hyperglycemia: Secondary | ICD-10-CM

## 2021-07-14 NOTE — Telephone Encounter (Signed)
Spoke with patient & advised that she keep follow up as scheduled, and that she does not need a repeat US. Her last [place] was in April of this year.

## 2021-07-14 NOTE — Telephone Encounter (Signed)
Inbound call from patient stating that she was supposed to be scheduled to have a ultrasound but has never heard anything or wasn't sure if she was supposed to call on her own. Patient scheduled her 6 month follow up with Dr. Lyndel Safe for NASH. Patient is requesting a call back to discuss hoe she needs to get ultrasound set up. Please advise.

## 2021-07-24 ENCOUNTER — Encounter (HOSPITAL_COMMUNITY): Payer: Self-pay | Admitting: Emergency Medicine

## 2021-07-24 ENCOUNTER — Ambulatory Visit (HOSPITAL_COMMUNITY)
Admission: EM | Admit: 2021-07-24 | Discharge: 2021-07-24 | Disposition: A | Discharge status: Home / Self Care | Payer: BLUE CROSS/BLUE SHIELD | Attending: Physician Assistant | Admitting: Physician Assistant

## 2021-07-24 DIAGNOSIS — Z760 Encounter for issue of repeat prescription: Secondary | ICD-10-CM | POA: Diagnosis not present

## 2021-07-24 DIAGNOSIS — E1141 Type 2 diabetes mellitus with diabetic mononeuropathy: Secondary | ICD-10-CM | POA: Diagnosis not present

## 2021-07-24 MED ORDER — TRAMADOL HCL 50 MG PO TABS: 50.0000 mg | ORAL_TABLET | Freq: Four times a day (QID) | ORAL | 0 refills | Status: DC | PRN

## 2021-07-24 MED ORDER — GABAPENTIN 300 MG PO CAPS: 300.0000 mg | ORAL_CAPSULE | ORAL | 0 refills | Status: DC

## 2021-07-24 NOTE — ED Provider Notes (Signed)
[facility] [facility]    CSN: [id number] Arrival date & time: 07/24/21  1103      History   Chief Complaint Chief Complaint  Patient presents with   Medication Refill   Foot Pain    HPI Teresa Castaneda is a 51 y.o. female.   51 year old female presents with diabetic neuropathy of the feet.  Patient currently relates that she has had moderately severe diabetic neuropathy of feet for many years.  Patient relates the pain for the past several days has been at the distal portions of her feet plantar surface, feels like they are throbbing, with shooting pains, that are constant.  She relates sometimes mild relief with ice bath for the feet.  Patient indicates that she usually takes gabapentin 300 mg 1 tablet in the morning, 2 at lunchtime, 3 tablets of the evening.  Relates this helps to ease the pain somewhat but she is out of her prescription.  Patient indicates that her provider has not sent a prescription over to the pharmacy for renewal yet.  Patient does relate that she has an appointment to see the neurologist for evaluation at the end of August.  Patient also indicates that the pain over the past couple days has been severe, she rates it as a 10 out of 1-10.  Patient indicates that she has taken tramadol in the past and it does give her some mild relief.  Patient indicates she tolerates this medication well without side effects.  Patient denies any fever or chills, no nausea or vomiting.   Medication Refill Foot Pain    Past Medical History:  Diagnosis Date   Allergies    Anxiety    Arthritis    Asthma    Back pain    Spinal Issue    Diabetes mellitus without complication (HCC)    DJD (degenerative joint disease), lumbar    Elevated cholesterol    Gallstone    GERD (gastroesophageal reflux disease)    Liver cirrhosis secondary to NASH ([place])    Neuromuscular disorder ([place])    Obesity    Oxygen deficiency    Thyroid disease     Patient Active Problem List   Diagnosis  Date Noted   Cirrhosis of liver ([place]) 06/23/2020   Symptomatic cholelithiasis 05/10/2020   Acquired hypothyroidism 04/05/2020   Weight gain 04/05/2020   Type 2 diabetes mellitus with hyperglycemia, without long-term current use of insulin ([place]) 04/05/2020   History of COVID-19 09/23/2019   Pneumonia due to COVID-19 virus 09/23/2019   Cough 09/23/2019   BMI 40.0-44.9, adult ([place]) 07/22/2019   Facet arthropathy, lumbar 07/22/2019   Sacroiliitis ([place]) 07/22/2019   ANXIETY 09/10/2006   DEPRESSION 09/10/2006   ASTHMA 09/10/2006   HEADACHE 09/10/2006    Past Surgical History:  Procedure Laterality Date   ABDOMINAL HYSTERECTOMY  02/21/2003   CHOLECYSTECTOMY N/A 05/10/2020   Procedure: LAPAROSCOPIC CHOLECYSTECTOMY WITH INTRAOPERATIVE CHOLANGIOGRAM AND LIVER BIOPSY;  Surgeon: Felicie Morn, MD;  [facility];  Service: General;  Laterality: N/A;   DG SACROILIAC JOINTS ([place] HX)  01/11/2021   [facility]   ENDOSCOPIC TURBINATE REDUCTION Bilateral 10/13/2020   Procedure: ENDOSCOPIC BILATERAL INFERIOR TURBINATE REDUCTIONS;  Surgeon: Rozetta Nunnery, MD;  Location: [place];  Service: ENT;  Laterality: Bilateral;   ETHMOIDECTOMY Bilateral 10/13/2020   Procedure: BILATERAL TOTAL ETHMOIDECTOMY AND MAXILLARY OSTIA ENLARGEMENTS;  Surgeon: Rozetta Nunnery, MD;  Location: [facility];  Service: ENT;  Laterality: Bilateral;   LIVER  BIOPSY     NASAL SINUS SURGERY Bilateral 10/13/2020   Procedure: SINUS ENDOSCOPY WITH STEALTH NAVIGATION;  Surgeon: Rozetta Nunnery, MD;  Location: [facility];  Service: ENT;  Laterality: Bilateral;   TONSILLECTOMY      OB History   No obstetric history on file.      Home Medications    Prior to Admission medications   Medication Sig Start Date End Date Taking? Authorizing Provider  traMADol (ULTRAM) 50 MG tablet Take 1 tablet (50 mg total) by mouth every 6 (six) hours as needed.  07/24/21  Yes Nyoka Lint, PA-C  albuterol (VENTOLIN HFA) 108 (90 Base) MCG/ACT inhaler Inhale 2 puffs into the lungs every 6 (six) hours as needed. For shortness of breath and wheezing 11/04/20   Freddi Starr, MD  atorvastatin (LIPITOR) 10 MG tablet Take 1 tablet (10 mg total) by mouth daily. 10/18/20   Wendie Agreste, MD  cyclobenzaprine (FLEXERIL) 10 MG tablet Take 10 mg by mouth as needed. Patient not taking: Reported on 06/20/2021 12/22/20   [provider]  DULoxetine (CYMBALTA) 60 MG capsule TAKE 2 CAPSULE BY MOUTH ONCE DAILY 05/02/21   Wendie Agreste, MD  FARXIGA 10 MG TABS tablet Take 10 mg by mouth daily. 12/04/20   [provider]  Fluticasone-Umeclidin-Vilant (TRELEGY ELLIPTA) 100-62.5-25 MCG/ACT AEPB Inhale 1 puff into the lungs daily. 11/04/20   Freddi Starr, MD  furosemide (LASIX) 20 MG tablet TAKE 1 TABLET BY MOUTH ONCE DAILY AS NEEDED 06/27/21   Wendie Agreste, MD  gabapentin (NEURONTIN) 300 MG capsule Take 1 capsule (300 mg total) by mouth as directed. 1 capsule in the morning and 2 pills at lunch and 3 pills at night 07/24/21   Nyoka Lint, PA-C  glipiZIDE (GLUCOTROL) 5 MG tablet Take 1 tablet (5 mg total) by mouth daily before breakfast AND 2 tablets (10 mg total) daily before supper. 06/20/21   Shamleffer, Melanie C[place], MD  hydrOXYzine (ATARAX) 10 MG tablet Take 1-2 tablets (10-20 mg total) by mouth 3 (three) times daily as needed for anxiety (or sleep.). 05/02/21   Wendie Agreste, MD  ibuprofen (ADVIL) 600 MG tablet Take 1 tablet (600 mg total) by mouth 2 (two) times daily as needed. 04/15/21   Thurman Coyer, DO  levocetirizine (XYZAL) 5 MG tablet Take 1 tablet (5 mg total) by mouth every evening. 05/02/21   Wendie Agreste, MD  levothyroxine (SYNTHROID) 150 MCG tablet Take 1 tablet (150 mcg total) by mouth daily. 04/20/21   Shamleffer, Melanie Crazier, MD  lidocaine (LIDODERM) 5 % USE 1 PATCH EXTERNALLY ONCE DAILY REMOVE  AND  DISCARD  PATCH   WITH  12  HOURS  OR  AS  DIRECTED  BY  MD 08/11/20   Wendie Agreste, MD  metFORMIN (GLUCOPHAGE) 850 MG tablet TAKE 1 TABLET BY MOUTH TWICE DAILY WITH A MEAL 07/14/21   Shamleffer, Melanie Crazier, MD  omeprazole (PRILOSEC) 40 MG capsule Take 1 capsule (40 mg total) by mouth daily. 10/20/20   Jackquline Denmark, MD  triamcinolone (NASACORT) 55 MCG/ACT AERO nasal inhaler Place 2 sprays into the nose daily. 05/02/21   Wendie Agreste, MD  XIFAXAN 550 MG TABS tablet Take 1 tablet by mouth twice daily 11/08/20   Jackquline Denmark, MD  azelastine (ASTELIN) 0.1 % nasal spray Place 2 sprays into both nostrils 2 (two) times daily. 04/23/19 12/26/19  Ok Edwards, PA-C    Family History Family History  Problem  Relation Age of Onset   Coronary artery disease Mother    Diabetes Mother    COPD Mother    Coronary artery disease Father    Hypertension Father    Colon cancer Neg Hx    Esophageal cancer Neg Hx     Social History Social History   Tobacco Use   Smoking status: Former    Packs/day: 0.50    Types: Cigarettes    Start date: 01/16/1985    Quit date: 10/08/2019    Years since quitting: 1.7   Smokeless tobacco: Never  Vaping Use   Vaping Use: Never used  Substance Use Topics   Alcohol use: No   Drug use: No     Allergies   Clarithromycin, Codeine, and Tylenol [acetaminophen]   Review of Systems Review of Systems  Musculoskeletal:  Positive for arthralgias (both feet pain).     Physical Exam Triage Vital Signs ED Triage Vitals  Enc Vitals Group     BP 07/24/21 1141 96/72     Pulse Rate 07/24/21 1141 93     Resp 07/24/21 1141 17     Temp 07/24/21 1141 97.8 F (36.6 C)     Temp Source 07/24/21 1141 Oral     SpO2 07/24/21 1141 97 %     Weight --      Height --      Head Circumference --      Peak Flow --      Pain Score 07/24/21 1140 9     Pain Loc --      Pain Edu? --      Excl. in [place]? --    No data found.  Updated Vital Signs BP 96/72 (BP Location: Right Arm)   Pulse 93    Temp 97.8 F (36.6 C) (Oral)   Resp 17   SpO2 97%   Visual Acuity Right Eye Distance:   Left Eye Distance:   Bilateral Distance:    Right Eye Near:   Left Eye Near:    Bilateral Near:     Physical Exam Constitutional:      Appearance: Normal appearance.  Cardiovascular:     Rate and Rhythm: Normal rate and regular rhythm.     Heart sounds: Normal heart sounds.  Musculoskeletal:     Comments: Feet: Bilateral feet appear normal, no redness, no swelling.  Pain on palpation of the dorsum and plantar surface of the feet.  Neurological:     Mental Status: She is alert.      UC Treatments / Results  Labs (all labs ordered are listed, but only abnormal results are displayed) Labs Reviewed - No data to display  EKG   Radiology No results found.  Procedures Procedures (including critical care time)  Medications Ordered in UC Medications - No data to display  Initial Impression / Assessment and Plan / UC Course  I have reviewed the triage vital signs and the nursing notes.  Pertinent labs & imaging results that were available during my care of the patient were reviewed by me and considered in my medical decision making (see chart for details).    Plan: 1.  Advised patient to take the gabapentin 300 mg, 1 tablet with breakfast, 2 tablets at lunch, 3 tablets with supper.  This will help to treat the neuropathy. 2.  Advised patient to take tramadol tablets 1 tablet every 6-8 hours as needed to help reduce the neuropathy foot pain. 3.  Advised patient to follow-up with PCP  and to keep her appointment with neurology at the end of August for evaluation of her neuropathy. Final Clinical Impressions(s) / UC Diagnoses   Final diagnoses:  Medication refill  Diabetic mononeuropathy associated with type 2 diabetes mellitus ([place])     Discharge Instructions      Advised to take the Neurontin as directed to help reduce the neuropathy. Advised to take the tramadol 1 every  6-8 hours as needed to help decrease the pain. Advised to follow-up with neurology at the end of August. Advised follow-up PCP or return to urgent care if symptoms fail to improve.    ED Prescriptions     Medication Sig Dispense Auth. Provider   gabapentin (NEURONTIN) 300 MG capsule Take 1 capsule (300 mg total) by mouth as directed. 1 capsule in the morning and 2 pills at lunch and 3 pills at night 180 capsule Nyoka Lint, PA-C   traMADol (ULTRAM) 50 MG tablet Take 1 tablet (50 mg total) by mouth every 6 (six) hours as needed. 20 tablet Nyoka Lint, PA-C      I have reviewed the PDMP during this encounter.   Nyoka Lint, PA-C 07/24/21 1218

## 2021-07-24 NOTE — Discharge Instructions (Addendum)
Advised to take the Neurontin as directed to help reduce the neuropathy. Advised to take the tramadol 1 every 6-8 hours as needed to help decrease the pain. Advised to follow-up with neurology at the end of August. Advised follow-up PCP or return to urgent care if symptoms fail to improve.

## 2021-07-24 NOTE — ED Triage Notes (Signed)
Pt reports bilateral foot pain. States she has neuropathy and ran out of her Gabapentin medication. Requesting refill if possible.

## 2021-08-08 ENCOUNTER — Ambulatory Visit (INDEPENDENT_AMBULATORY_CARE_PROVIDER_SITE_OTHER): Payer: BC Managed Care – PPO | Admitting: Pulmonary Disease

## 2021-08-08 ENCOUNTER — Encounter: Payer: Self-pay | Admitting: Pulmonary Disease

## 2021-08-08 VITALS — BP 114/64 | HR 82 | Ht 66.0 in | Wt 260.2 lb

## 2021-08-08 DIAGNOSIS — G4734 Idiopathic sleep related nonobstructive alveolar hypoventilation: Secondary | ICD-10-CM | POA: Diagnosis not present

## 2021-08-08 DIAGNOSIS — J4540 Moderate persistent asthma, uncomplicated: Secondary | ICD-10-CM

## 2021-08-08 MED ORDER — ALBUTEROL SULFATE HFA 108 (90 BASE) MCG/ACT IN AERS: 2.0000 | INHALATION_SPRAY | Freq: Four times a day (QID) | RESPIRATORY_TRACT | 11 refills | Status: DC | PRN

## 2021-08-08 MED ORDER — TRELEGY ELLIPTA 100-62.5-25 MCG/ACT IN AEPB: 1.0000 | INHALATION_SPRAY | Freq: Every day | RESPIRATORY_TRACT | 6 refills | Status: DC

## 2021-08-08 NOTE — Progress Notes (Signed)
Synopsis: Referred in September 2021 for shortness of breath, followed for moderate persistent asthma.   Subjective:   PATIENT ID: Teresa Castaneda GENDER: female DOB: [birth year], MRN: [id number]  HPI  Chief Complaint  Patient presents with   Follow-up    9 mo f/u asthma. States her breathing has stable since last visit. Still using Trelegy.     Teresa Castaneda is a 51 year old woman, former smoker with asthma, history Covid 19 pneumonia, NASH cirrhosis, GERD, DMII and nocturnal hypoxemia who returns to pulmonary clinic for follow up of asthma.  She has been on trelegy ellipta 1 puff daily. She uses albuterol monthly. She required albuterol more frequently during the poor air quality times but overall has been doing well on trelegy.  She reports increased lower extremity edema after working her weekend shift.   She is requesting to cancel her home oxygen supplies due to costs.   OV 11/04/20 She continues to do well from a respiratory standpoint. She had sinus surgery 3 weeks ago and is having sinus drainage which leads to cough. She is using saline sinus rinses and nasonex spray 1 spray per nostril daily. She has close follow up with ENT.   She was recently diagnosed with NASH cirrhosis and is taking rifaximin.   She broke her foot recently and has been inactive due to this otherwise she was working on weight loss prior to this happening.  OV 02/10/20 She reports feeling significantly better since starting Trelegy Ellipta daily. She has used her albuterol inhaler 2 times in the past 2 weeks. She has started using 2L of oxygen at night and has also noticed improvement in her shortness of breath. She still experiences dyspnea with exertion but overall improved since last visit. She saw Dr. Irish Lack of Cardiology on 12/09/19 for her exertional dyspnea and family history of early CAD. She was started on atorvastatin 21m daily for hyperlipidemia. She is taking lasix as needed for shortness of  breath and lower extremity edema.  OV 10/2019 Recent labs show an IgE level of 621 and absolute eosinophil count of 400. CBC also indicates low platelets of 122k and 129k over the past two lab draws in October and July. She also had elevated LFTs in July, AST 41/ALT 58.     Pulmonary function tests show normal spirometry, lung volumes and DLCO and no significant change to bronchodilators.    She had an echo done on 10/20/19 which showed normal EF 625-00% grade I diastolic dysfunction. Normal RV systolic function and RVSP of 27.4 mmHg. Mildly dilated left atrial size.   She had a home sleep study done recently which showed an AHI of 2.9 and 50.5 minutes out of 370 minutes she had oxygen desaturations below 89%.     She works at [employer].   Past Medical History:  Diagnosis Date   Allergies    Anxiety    Arthritis    Asthma    Back pain    Spinal Issue    Diabetes mellitus without complication (HCC)    DJD (degenerative joint disease), lumbar    Elevated cholesterol    Gallstone    GERD (gastroesophageal reflux disease)    Liver cirrhosis secondary to NASH [place]    Neuromuscular disorder (HCC)    Obesity    Oxygen deficiency    Thyroid disease      Family History  Problem Relation Age of Onset   Coronary artery disease Mother    Diabetes Mother  COPD Mother    Coronary artery disease Father    Hypertension Father    Colon cancer Neg Hx    Esophageal cancer Neg Hx      Social History   Socioeconomic History   Marital status: Single    Spouse name: Not on file   Number of children: Not on file   Years of education: Not on file   Highest education level: Not on file  Occupational History   Not on file  Tobacco Use   Smoking status: Former    Packs/day: 0.50    Types: Cigarettes    Start date: 01/16/1985    Quit date: 10/08/2019    Years since quitting: 1.8   Smokeless tobacco: Never  Vaping Use   Vaping Use: Never used  Substance and Sexual Activity    Alcohol use: No   Drug use: No   Sexual activity: Not on file  Other Topics Concern   Not on file  Social History Narrative   Not on file   Social Determinants of Health   Financial Resource Strain: Not on file  Food Insecurity: Not on file  Transportation Needs: Not on file  Physical Activity: Not on file  Stress: Not on file  Social Connections: Not on file  Intimate Partner Violence: Not on file     Allergies  Allergen Reactions   Clarithromycin Other (See Comments)    Body cramps    Codeine Nausea And Vomiting   Tylenol [Acetaminophen] Other (See Comments)    Can not take Tylenol products due to liver     Outpatient Medications Prior to Visit  Medication Sig Dispense Refill   atorvastatin (LIPITOR) 10 MG tablet Take 1 tablet (10 mg total) by mouth daily. 90 tablet 3   cyclobenzaprine (FLEXERIL) 10 MG tablet Take 10 mg by mouth as needed.     DULoxetine (CYMBALTA) 60 MG capsule TAKE 2 CAPSULE BY MOUTH ONCE DAILY 180 capsule 1   FARXIGA 10 MG TABS tablet Take 10 mg by mouth daily.     furosemide (LASIX) 20 MG tablet TAKE 1 TABLET BY MOUTH ONCE DAILY AS NEEDED 60 tablet 0   gabapentin (NEURONTIN) 300 MG capsule Take 1 capsule (300 mg total) by mouth as directed. 1 capsule in the morning and 2 pills at lunch and 3 pills at night 180 capsule 0   glipiZIDE (GLUCOTROL) 5 MG tablet Take 1 tablet (5 mg total) by mouth daily before breakfast AND 2 tablets (10 mg total) daily before supper. 270 tablet 3   ibuprofen (ADVIL) 600 MG tablet Take 1 tablet (600 mg total) by mouth 2 (two) times daily as needed. 30 tablet 0   levocetirizine (XYZAL) 5 MG tablet Take 1 tablet (5 mg total) by mouth every evening. 90 tablet 3   levothyroxine (SYNTHROID) 150 MCG tablet Take 1 tablet (150 mcg total) by mouth daily. 90 tablet 3   lidocaine (LIDODERM) 5 % USE 1 PATCH EXTERNALLY ONCE DAILY REMOVE  AND  DISCARD  PATCH  WITH  12  HOURS  OR  AS  DIRECTED  BY  MD 30 patch 0   metFORMIN (GLUCOPHAGE) 850  MG tablet TAKE 1 TABLET BY MOUTH TWICE DAILY WITH A MEAL 60 tablet 3   omeprazole (PRILOSEC) 40 MG capsule Take 1 capsule (40 mg total) by mouth daily. 90 capsule 3   traMADol (ULTRAM) 50 MG tablet Take 1 tablet (50 mg total) by mouth every 6 (six) hours as needed. 20 tablet 0  triamcinolone (NASACORT) 55 MCG/ACT AERO nasal inhaler Place 2 sprays into the nose daily. 1 each 12   XIFAXAN 550 MG TABS tablet Take 1 tablet by mouth twice daily 60 tablet 12   albuterol (VENTOLIN HFA) 108 (90 Base) MCG/ACT inhaler Inhale 2 puffs into the lungs every 6 (six) hours as needed. For shortness of breath and wheezing 18 g 3   Fluticasone-Umeclidin-Vilant (TRELEGY ELLIPTA) 100-62.5-25 MCG/ACT AEPB Inhale 1 puff into the lungs daily. 60 each 5   hydrOXYzine (ATARAX) 10 MG tablet Take 1-2 tablets (10-20 mg total) by mouth 3 (three) times daily as needed for anxiety (or sleep.). 90 tablet 1   No facility-administered medications prior to visit.    Review of Systems  Constitutional:  Negative for chills, diaphoresis, fever, malaise/fatigue and weight loss.  HENT:  Negative for congestion, ear pain, nosebleeds, sinus pain and sore throat.   Eyes:  Negative for blurred vision.  Respiratory:  Negative for cough, hemoptysis, sputum production, shortness of breath and wheezing.   Cardiovascular:  Negative for chest pain, palpitations, orthopnea, claudication, leg swelling and PND.  Gastrointestinal:  Negative for abdominal pain, blood in stool, heartburn, nausea and vomiting.  Genitourinary:  Negative for hematuria.  Musculoskeletal:  Negative for joint pain and myalgias.  Skin:  Negative for itching and rash.  Neurological:  Negative for dizziness, weakness and headaches.  Endo/Heme/Allergies:  Does not bruise/bleed easily.  Psychiatric/Behavioral: Negative.      Objective:   Vitals:   08/08/21 0859  BP: 114/64  Pulse: 82  SpO2: 98%  Weight: 260 lb 3.2 oz (118 kg)  Height: 5' 6"  (1.676 m)   Physical  Exam Constitutional:      General: She is not in acute distress.    Appearance: She is obese.  HENT:     Head: Normocephalic and atraumatic.     Nose: Nose normal.     Mouth/Throat:     Mouth: Mucous membranes are moist.     Pharynx: Oropharynx is clear.  Eyes:     General: No scleral icterus. Cardiovascular:     Rate and Rhythm: Normal rate and regular rhythm.     Pulses: Normal pulses.     Heart sounds: Normal heart sounds. No murmur heard. Pulmonary:     Effort: Pulmonary effort is normal.     Breath sounds: No decreased air movement. No wheezing, rhonchi or rales.  Musculoskeletal:     Right lower leg: Edema present.     Left lower leg: Edema present.  Skin:    General: Skin is warm and dry.     Findings: No rash.  Neurological:     General: No focal deficit present.     Mental Status: She is alert and oriented to person, place, and time. Mental status is at baseline.  Psychiatric:        Mood and Affect: Mood normal.        Behavior: Behavior normal.        Thought Content: Thought content normal.        Judgment: Judgment normal.     CBC    Component Value Date/Time   WBC 8.7 05/10/2021 0845   RBC 5.03 05/10/2021 0845   HGB 11.9 (L) 05/10/2021 0845   HGB 12.4 02/11/2021 0936   HCT 37.0 05/10/2021 0845   HCT 40.0 02/11/2021 0936   PLT 150.0 05/10/2021 0845   PLT 124 (L) 03/25/2020 1520   MCV 73.6 (L) 05/10/2021 0845   MCV 78 (L)  02/11/2021 0936   MCH 24.3 (L) 02/11/2021 0936   MCH 26.4 10/08/2020 1510   MCHC 32.1 05/10/2021 0845   RDW 17.9 (H) 05/10/2021 0845   RDW 15.7 (H) 02/11/2021 0936   LYMPHSABS 2.4 05/10/2021 0845   LYMPHSABS 3.8 (H) 02/11/2021 0936   MONOABS 0.6 05/10/2021 0845   EOSABS 0.3 05/10/2021 0845   EOSABS 0.3 02/11/2021 0936   BASOSABS 0.1 05/10/2021 0845   BASOSABS 0.1 02/11/2021 0936      Latest Ref Rng & Units 05/10/2021    8:45 AM 02/11/2021    9:36 AM 10/20/2020    9:10 AM  BMP  Glucose 70 - 99 mg/dL 216  165  152   BUN 6 -  23 mg/dL 7  11  17    Creatinine 0.40 - 1.20 mg/dL 0.52  0.61  0.60   Sodium 135 - 145 mEq/L 136  137  134   Potassium 3.5 - 5.1 mEq/L 4.1  4.3  4.3   Chloride 96 - 112 mEq/L 99  100  100   CO2 19 - 32 mEq/L 29  27  26    Calcium 8.4 - 10.5 mg/dL 9.1  8.9  9.0    Chest imaging:  CXR 09/12/19  Heart size is normal. Mediastinal shadows are normal. Mild patchy infiltrates and or volume loss in the mid and lower lungs. No dense consolidation or lobar collapse. No effusions. No significant bone finding.  09/23/19 The heart size and mediastinal contours are unchanged. Persistent trace bi basilar airspace opacities. No new focal consolidation. The visualized skeletal structures are unremarkable.  10/03/19: Hazy bibasilar airspace disease. No other focal parenchymal opacity. No pleural effusion or pneumothorax. Stable cardiomediastinal silhouette. No aggressive osseous lesion.    PFT: Post FEV1/FVC: 85 FEV1: 2.36L (77%) FVC: 2.79L (72%) TLC: 4.79L (89%) DLCO 121%  Labs: Reviewed as above  ECHO 10/20/19 1. Left ventricular ejection fraction, by estimation, is 65 to 70%. The  left ventricle has normal function. The left ventricle has no regional  wall motion abnormalities. There is mild concentric left ventricular  hypertrophy. Left ventricular diastolic  parameters are consistent with Grade I diastolic dysfunction (impaired  relaxation). Elevated left atrial pressure.   2. Right ventricular systolic function is normal. The right ventricular  size is normal. There is normal pulmonary artery systolic pressure. The  estimated right ventricular systolic pressure is 33.5 mmHg.   3. Left atrial size was mildly dilated.   4. The mitral valve is normal in structure. Mild mitral valve  regurgitation. No evidence of mitral stenosis.   5. The aortic valve is normal in structure. Aortic valve regurgitation is  not visualized. No aortic stenosis is present.   6. The inferior vena cava is normal  in size with greater than 50%  respiratory variability, suggesting right atrial pressure of 3 mmHg.   Abdominal US 11/21/19 1.  Cholelithiasis without evidence of cholecystitis.   2. Diffusely increased liver parenchymal echogenicity which is most commonly seen with hepatic steatosis. There is an ill-defined hypoechoic area adjacent to the gallbladder fossa which is indeterminate but favored to represent focal fatty sparing.  Assessment & Plan:   Nocturnal hypoxemia - Plan: Ambulatory Referral for DME  Moderate persistent asthma without complication - Plan: albuterol (VENTOLIN HFA) 108 (90 Base) MCG/ACT inhaler, Fluticasone-Umeclidin-Vilant (TRELEGY ELLIPTA) 100-62.5-25 MCG/ACT AEPB  Discussion: Teresa Castaneda is a 51 year old woman, former smoker with asthma, history Covid 19 pneumonia, NASH cirrhosis, GERD, DMII and nocturnal hypoxemia who returns to pulmonary  clinic for follow up of asthma.  She is doing well on trelegy ellipta for her asthma. She can continue albuterol as needed. We will cancel her home O2 orders as requested due to financial reasons.  She is to follow up in 1 year.  Freda Jackson, MD [facility] Office: [phone]   Current Outpatient Medications:    albuterol (VENTOLIN HFA) 108 (90 Base) MCG/ACT inhaler, Inhale 2 puffs into the lungs every 6 (six) hours as needed for wheezing or shortness of breath., Disp: 8 g, Rfl: 11   atorvastatin (LIPITOR) 10 MG tablet, Take 1 tablet (10 mg total) by mouth daily., Disp: 90 tablet, Rfl: 3   cyclobenzaprine (FLEXERIL) 10 MG tablet, Take 10 mg by mouth as needed., Disp: , Rfl:    DULoxetine (CYMBALTA) 60 MG capsule, TAKE 2 CAPSULE BY MOUTH ONCE DAILY, Disp: 180 capsule, Rfl: 1   FARXIGA 10 MG TABS tablet, Take 10 mg by mouth daily., Disp: , Rfl:    furosemide (LASIX) 20 MG tablet, TAKE 1 TABLET BY MOUTH ONCE DAILY AS NEEDED, Disp: 60 tablet, Rfl: 0   gabapentin (NEURONTIN) 300 MG capsule, Take 1  capsule (300 mg total) by mouth as directed. 1 capsule in the morning and 2 pills at lunch and 3 pills at night, Disp: 180 capsule, Rfl: 0   glipiZIDE (GLUCOTROL) 5 MG tablet, Take 1 tablet (5 mg total) by mouth daily before breakfast AND 2 tablets (10 mg total) daily before supper., Disp: 270 tablet, Rfl: 3   ibuprofen (ADVIL) 600 MG tablet, Take 1 tablet (600 mg total) by mouth 2 (two) times daily as needed., Disp: 30 tablet, Rfl: 0   levocetirizine (XYZAL) 5 MG tablet, Take 1 tablet (5 mg total) by mouth every evening., Disp: 90 tablet, Rfl: 3   levothyroxine (SYNTHROID) 150 MCG tablet, Take 1 tablet (150 mcg total) by mouth daily., Disp: 90 tablet, Rfl: 3   lidocaine (LIDODERM) 5 %, USE 1 PATCH EXTERNALLY ONCE DAILY REMOVE  AND  DISCARD  PATCH  WITH  12  HOURS  OR  AS  DIRECTED  BY  MD, Disp: 30 patch, Rfl: 0   metFORMIN (GLUCOPHAGE) 850 MG tablet, TAKE 1 TABLET BY MOUTH TWICE DAILY WITH A MEAL, Disp: 60 tablet, Rfl: 3   omeprazole (PRILOSEC) 40 MG capsule, Take 1 capsule (40 mg total) by mouth daily., Disp: 90 capsule, Rfl: 3   traMADol (ULTRAM) 50 MG tablet, Take 1 tablet (50 mg total) by mouth every 6 (six) hours as needed., Disp: 20 tablet, Rfl: 0   triamcinolone (NASACORT) 55 MCG/ACT AERO nasal inhaler, Place 2 sprays into the nose daily., Disp: 1 each, Rfl: 12   XIFAXAN 550 MG TABS tablet, Take 1 tablet by mouth twice daily, Disp: 60 tablet, Rfl: 12   Fluticasone-Umeclidin-Vilant (TRELEGY ELLIPTA) 100-62.5-25 MCG/ACT AEPB, Inhale 1 puff into the lungs daily., Disp: 60 each, Rfl: 6

## 2021-08-08 NOTE — Patient Instructions (Addendum)
We will send an order to your DME company to cancel the home oxygen supplies.   Continue trelegy ellipta 1 puff daily  Continue to use albuterol as needed  Please call if your asthma symptoms worsen and we can schedule you sooner.  Follow up in 1 year.

## 2021-08-13 ENCOUNTER — Encounter: Payer: Self-pay | Admitting: Pulmonary Disease

## 2021-08-19 ENCOUNTER — Encounter: Payer: Self-pay | Admitting: Internal Medicine

## 2021-08-19 ENCOUNTER — Ambulatory Visit
Admission: EM | Admit: 2021-08-19 | Discharge: 2021-08-19 | Disposition: A | Discharge status: Home / Self Care | Payer: BC Managed Care – PPO | Attending: Physician Assistant | Admitting: Physician Assistant

## 2021-08-19 DIAGNOSIS — G629 Polyneuropathy, unspecified: Secondary | ICD-10-CM | POA: Diagnosis not present

## 2021-08-19 MED ORDER — TRAMADOL HCL 50 MG PO TABS: 50.0000 mg | ORAL_TABLET | Freq: Four times a day (QID) | ORAL | 0 refills | Status: AC | PRN

## 2021-08-19 NOTE — ED Provider Notes (Signed)
[name]    CSN: [id number] Arrival date & time: 08/19/21  1625      History   Chief Complaint Chief Complaint  Patient presents with   Peripheral Neuropathy    HPI Teresa Castaneda is a 51 y.o. female.   Patient here today for evaluation of suspected neuropathy flare.  She notes she has had worsening pins-and-needles, numbness in her distal feet.  She reports that she does have a neurologist but is not scheduled to see them until the end of this month.  Her diabetic doctor is also on vacation until 14th.  She has tried getting in with her primary care provider but has been unable to do so as they do not have any available appointments.  She reports in the past she was prescribed tramadol when gabapentin was not effective and this was helpful.  She requests refill of same if possible.  She has tried changing her shoes without significant relief.  The history is provided by the patient.    Past Medical History:  Diagnosis Date   Allergies    Anxiety    Arthritis    Asthma    Back pain    Spinal Issue    Diabetes mellitus without complication (HCC)    DJD (degenerative joint disease), lumbar    Elevated cholesterol    Gallstone    GERD (gastroesophageal reflux disease)    Liver cirrhosis secondary to NASH ([place])    Neuromuscular disorder ([place])    Obesity    Oxygen deficiency    Thyroid disease     Patient Active Problem List   Diagnosis Date Noted   Cirrhosis of liver ([place]) 06/23/2020   Symptomatic cholelithiasis 05/10/2020   Acquired hypothyroidism 04/05/2020   Weight gain 04/05/2020   Type 2 diabetes mellitus with hyperglycemia, without long-term current use of insulin ([place]) 04/05/2020   History of COVID-19 09/23/2019   Pneumonia due to COVID-19 virus 09/23/2019   Cough 09/23/2019   BMI 40.0-44.9, adult ([place]) 07/22/2019   Facet arthropathy, lumbar 07/22/2019   Sacroiliitis ([place]) 07/22/2019   ANXIETY 09/10/2006   DEPRESSION 09/10/2006   ASTHMA  09/10/2006   HEADACHE 09/10/2006    Past Surgical History:  Procedure Laterality Date   ABDOMINAL HYSTERECTOMY  02/21/2003   CHOLECYSTECTOMY N/A 05/10/2020   Procedure: LAPAROSCOPIC CHOLECYSTECTOMY WITH INTRAOPERATIVE CHOLANGIOGRAM AND LIVER BIOPSY;  Surgeon: Felicie Morn, MD;  Location: [facility];  Service: General;  Laterality: N/A;   DG SACROILIAC JOINTS ([place] HX)  01/11/2021   [facility]   ENDOSCOPIC TURBINATE REDUCTION Bilateral 10/13/2020   Procedure: ENDOSCOPIC BILATERAL INFERIOR TURBINATE REDUCTIONS;  Surgeon: Rozetta Nunnery, MD;  Location: [facility];  Service: ENT;  Laterality: Bilateral;   ETHMOIDECTOMY Bilateral 10/13/2020   Procedure: BILATERAL TOTAL ETHMOIDECTOMY AND MAXILLARY OSTIA ENLARGEMENTS;  Surgeon: Rozetta Nunnery, MD;  Location: [facility];  Service: ENT;  Laterality: Bilateral;   LIVER BIOPSY     NASAL SINUS SURGERY Bilateral 10/13/2020   Procedure: SINUS ENDOSCOPY WITH STEALTH NAVIGATION;  Surgeon: Rozetta Nunnery, MD;  Location: [place];  Service: ENT;  Laterality: Bilateral;   TONSILLECTOMY      OB History   No obstetric history on file.      Home Medications    Prior to Admission medications   Medication Sig Start Date End Date Taking? Authorizing Provider  albuterol (VENTOLIN HFA) 108 (90 Base) MCG/ACT inhaler Inhale 2 puffs into the lungs every 6 (six) hours  as needed for wheezing or shortness of breath. 08/08/21   Freddi Starr, MD  atorvastatin (LIPITOR) 10 MG tablet Take 1 tablet (10 mg total) by mouth daily. 10/18/20   Wendie Agreste, MD  cyclobenzaprine (FLEXERIL) 10 MG tablet Take 10 mg by mouth as needed. 12/22/20   [provider]  DULoxetine (CYMBALTA) 60 MG capsule TAKE 2 CAPSULE BY MOUTH ONCE DAILY 05/02/21   Wendie Agreste, MD  FARXIGA 10 MG TABS tablet Take 10 mg by mouth daily. 12/04/20   [provider]   Fluticasone-Umeclidin-Vilant (TRELEGY ELLIPTA) 100-62.5-25 MCG/ACT AEPB Inhale 1 puff into the lungs daily. 08/08/21   Freddi Starr, MD  furosemide (LASIX) 20 MG tablet TAKE 1 TABLET BY MOUTH ONCE DAILY AS NEEDED 06/27/21   Wendie Agreste, MD  gabapentin (NEURONTIN) 300 MG capsule Take 1 capsule (300 mg total) by mouth as directed. 1 capsule in the morning and 2 pills at lunch and 3 pills at night 07/24/21   Nyoka Lint, PA-C  glipiZIDE (GLUCOTROL) 5 MG tablet Take 1 tablet (5 mg total) by mouth daily before breakfast AND 2 tablets (10 mg total) daily before supper. 06/20/21   Shamleffer, Melanie Crazier, MD  ibuprofen (ADVIL) 600 MG tablet Take 1 tablet (600 mg total) by mouth 2 (two) times daily as needed. 04/15/21   Thurman Coyer, DO  levocetirizine (XYZAL) 5 MG tablet Take 1 tablet (5 mg total) by mouth every evening. 05/02/21   Wendie Agreste, MD  levothyroxine (SYNTHROID) 150 MCG tablet Take 1 tablet (150 mcg total) by mouth daily. 04/20/21   Shamleffer, Melanie Crazier, MD  lidocaine (LIDODERM) 5 % USE 1 PATCH EXTERNALLY ONCE DAILY REMOVE  AND  DISCARD  PATCH  WITH  12  HOURS  OR  AS  DIRECTED  BY  MD 08/11/20   Wendie Agreste, MD  metFORMIN (GLUCOPHAGE) 850 MG tablet TAKE 1 TABLET BY MOUTH TWICE DAILY WITH A MEAL 07/14/21   Shamleffer, Melanie Crazier, MD  omeprazole (PRILOSEC) 40 MG capsule Take 1 capsule (40 mg total) by mouth daily. 10/20/20   Jackquline Denmark, MD  traMADol (ULTRAM) 50 MG tablet Take 1 tablet (50 mg total) by mouth every 6 (six) hours as needed for up to 3 days. 08/19/21 08/22/21  Francene Finders, PA-C  triamcinolone (NASACORT) 55 MCG/ACT AERO nasal inhaler Place 2 sprays into the nose daily. 05/02/21   Wendie Agreste, MD  XIFAXAN 550 MG TABS tablet Take 1 tablet by mouth twice daily 11/08/20   Jackquline Denmark, MD  azelastine (ASTELIN) 0.1 % nasal spray Place 2 sprays into both nostrils 2 (two) times daily. 04/23/19 12/26/19  Ok Edwards, PA-C    Family History Family  History  Problem Relation Age of Onset   Coronary artery disease Mother    Diabetes Mother    COPD Mother    Coronary artery disease Father    Hypertension Father    Colon cancer Neg Hx    Esophageal cancer Neg Hx     Social History Social History   Tobacco Use   Smoking status: Former    Packs/day: 0.50    Types: Cigarettes    Start date: 01/16/1985    Quit date: 10/08/2019    Years since quitting: 1.8   Smokeless tobacco: Never  Vaping Use   Vaping Use: Never used  Substance Use Topics   Alcohol use: No   Drug use: No     Allergies   Clarithromycin,  Codeine, and Tylenol [acetaminophen]   Review of Systems Review of Systems  Constitutional:  Negative for chills and fever.  Eyes:  Negative for discharge and redness.  Respiratory:  Negative for shortness of breath.   Gastrointestinal:  Negative for nausea and vomiting.  Neurological:  Positive for numbness.     Physical Exam Triage Vital Signs ED Triage Vitals [08/19/21 1740]  Enc Vitals Group     BP 110/70     Pulse Rate 97     Resp 18     Temp 98 F (36.7 C)     Temp Source Oral     SpO2 95 %     Weight      Height      Head Circumference      Peak Flow      Pain Score 0     Pain Loc      Pain Edu?      Excl. in [place]?    No data found.  Updated Vital Signs BP 110/70 (BP Location: Left Arm)   Pulse 97   Temp 98 F (36.7 C) (Oral)   Resp 18   SpO2 95%      Physical Exam Vitals and nursing note reviewed.  Constitutional:      General: She is not in acute distress.    Appearance: Normal appearance. She is not ill-appearing.  HENT:     Head: Normocephalic and atraumatic.  Eyes:     Conjunctiva/sclera: Conjunctivae normal.  Cardiovascular:     Rate and Rhythm: Normal rate.  Pulmonary:     Effort: Pulmonary effort is normal.  Neurological:     Mental Status: She is alert.  Psychiatric:        Mood and Affect: Mood normal.        Behavior: Behavior normal.        Thought Content:  Thought content normal.      UC Treatments / Results  Labs (all labs ordered are listed, but only abnormal results are displayed) Labs Reviewed - No data to display  EKG   Radiology No results found.  Procedures Procedures (including critical care time)  Medications Ordered in UC Medications - No data to display  Initial Impression / Assessment and Plan / UC Course  I have reviewed the triage vital signs and the nursing notes.  Pertinent labs & imaging results that were available during my care of the patient were reviewed by me and considered in my medical decision making (see chart for details).    Tramadol refilled as requested.  Encourage patient to keep follow-up as scheduled.  Recommend follow-up sooner with any further concerns.  Final Clinical Impressions(s) / UC Diagnoses   Final diagnoses:  Neuropathy   Discharge Instructions   None    ED Prescriptions     Medication Sig Dispense Auth. Provider   traMADol (ULTRAM) 50 MG tablet Take 1 tablet (50 mg total) by mouth every 6 (six) hours as needed for up to 3 days. 12 tablet Francene Finders, PA-C      I have reviewed the PDMP during this encounter.   Francene Finders, PA-C 08/19/21 1827

## 2021-08-19 NOTE — ED Triage Notes (Signed)
Pt c/o bilat foot neuropathy. Requesting tramadol, states she has gabapentin. Reports unable to get in with neurology until the end of this month. Also contacted DM provider but they are out of town until the 14th of this month. Requesting tramadol to last until apt.

## 2021-08-22 ENCOUNTER — Other Ambulatory Visit: Payer: Self-pay | Admitting: Internal Medicine

## 2021-08-22 ENCOUNTER — Other Ambulatory Visit: Payer: Self-pay

## 2021-08-22 MED ORDER — NONFORMULARY OR COMPOUNDED ITEM: 2 refills | Status: DC

## 2021-08-25 ENCOUNTER — Other Ambulatory Visit: Payer: Self-pay | Admitting: Family Medicine

## 2021-08-25 DIAGNOSIS — R600 Localized edema: Secondary | ICD-10-CM

## 2021-08-30 ENCOUNTER — Other Ambulatory Visit (INDEPENDENT_AMBULATORY_CARE_PROVIDER_SITE_OTHER): Payer: BC Managed Care – PPO

## 2021-08-30 ENCOUNTER — Ambulatory Visit: Payer: BC Managed Care – PPO | Admitting: Gastroenterology

## 2021-08-30 ENCOUNTER — Encounter: Payer: Self-pay | Admitting: Gastroenterology

## 2021-08-30 VITALS — BP 126/80 | HR 84 | Ht 66.0 in | Wt 267.0 lb

## 2021-08-30 DIAGNOSIS — K219 Gastro-esophageal reflux disease without esophagitis: Secondary | ICD-10-CM

## 2021-08-30 DIAGNOSIS — K7581 Nonalcoholic steatohepatitis (NASH): Secondary | ICD-10-CM | POA: Diagnosis not present

## 2021-08-30 DIAGNOSIS — K7460 Unspecified cirrhosis of liver: Secondary | ICD-10-CM | POA: Diagnosis not present

## 2021-08-30 DIAGNOSIS — R1011 Right upper quadrant pain: Secondary | ICD-10-CM

## 2021-08-30 LAB — COMPREHENSIVE METABOLIC PANEL
ALT: 29 U/L (ref 0–35)
AST: 39 U/L — ABNORMAL HIGH (ref 0–37)
Albumin: 3.5 g/dL (ref 3.5–5.2)
Alkaline Phosphatase: 81 U/L (ref 39–117)
BUN: 6 mg/dL (ref 6–23)
CO2: 27 mEq/L (ref 19–32)
Calcium: 9 mg/dL (ref 8.4–10.5)
Chloride: 103 mEq/L (ref 96–112)
Creatinine, Ser: 0.49 mg/dL (ref 0.40–1.20)
GFR: 110.41 mL/min (ref >60.00)
Glucose, Bld: 142 mg/dL — ABNORMAL HIGH (ref 70–99)
Potassium: 3.9 mEq/L (ref 3.5–5.1)
Sodium: 138 mEq/L (ref 135–145)
Total Bilirubin: 0.7 mg/dL (ref 0.2–1.2)
Total Protein: 7.3 g/dL (ref 6.0–8.3)

## 2021-08-30 LAB — PROTIME-INR
INR: 1.1 ratio — ABNORMAL HIGH (ref 0.8–1.0)
Prothrombin Time: 12.5 s (ref 9.6–13.1)

## 2021-08-30 LAB — CBC WITH DIFFERENTIAL/PLATELET
Basophils Absolute: 0 10*3/uL (ref 0.0–0.1)
Basophils Relative: 0.7 % (ref 0.0–3.0)
Eosinophils Absolute: 0.3 10*3/uL (ref 0.0–0.7)
Eosinophils Relative: 5.2 % — ABNORMAL HIGH (ref 0.0–5.0)
HCT: 33.4 % — ABNORMAL LOW (ref 36.0–46.0)
Hemoglobin: 10.4 g/dL — ABNORMAL LOW (ref 12.0–15.0)
Lymphocytes Relative: 37.6 % (ref 12.0–46.0)
Lymphs Abs: 2 10*3/uL (ref 0.7–4.0)
MCHC: 31.1 g/dL (ref 30.0–36.0)
MCV: 70.8 fl — ABNORMAL LOW (ref 78.0–100.0)
Monocytes Absolute: 0.3 10*3/uL (ref 0.1–1.0)
Monocytes Relative: 6.4 % (ref 3.0–12.0)
Neutro Abs: 2.7 10*3/uL (ref 1.4–7.7)
Neutrophils Relative %: 50.1 % (ref 43.0–77.0)
Platelets: 110 10*3/uL — ABNORMAL LOW (ref 150.0–400.0)
RBC: 4.72 Mil/uL (ref 3.87–5.11)
RDW: 19.2 % — ABNORMAL HIGH (ref 11.5–15.5)
WBC: 5.3 10*3/uL (ref 4.0–10.5)

## 2021-08-30 LAB — TSH: TSH: 35.56 u[IU]/mL — ABNORMAL HIGH (ref 0.35–5.50)

## 2021-08-30 LAB — HEMOGLOBIN A1C: Hgb A1c MFr Bld: 9.1 % — ABNORMAL HIGH (ref 4.6–6.5)

## 2021-08-30 NOTE — Progress Notes (Signed)
Chief Complaint: FU  Referring Provider:  Wendie Agreste, MD      ASSESSMENT AND PLAN;   #1. GERD   #2. 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 No EV on EGD 07/2020. No ascites.  -Neg extensive work-up. No ETOH.   -AFP was borderline elevated.  Neg CT Abdo/pelvis for any liver masses 05/2020.  Neg [place] 04/2021 -Has subclinical hepatic encephalopathy.  Could not tolerate lactulose. On rifaximin 550 twice daily.  #3. H/O biliary colic s/p lap chole with neg IOC and liver Bx 05/10/2020.  Plan: -Low salt, normal protein diet -CBC, CMP, TSH, PT INR, AFP, HBA1c -Continue Rifaxamin 556m po BID. -Continue omeprazole 446mpo QD #90 -Continue lasix 2056mo BID -Discussed weight loss in detail again. Aim to reduce 6lb over next 12 weeks. -Best possible control for DM. -FU in 6 months.     HPI:    Teresa Castaneda a [age]o. female  With DM, obesity, DJD, HLD, anxiety, L foot #, sinus Sx  For follow-up visit. [place]2023-stable liver cirrhosis without ascites, stable mild splenomegaly.  No nausea, vomiting, heartburn, regurgitation, odynophagia or dysphagia.  No significant diarrhea or constipation.  No melena or hematochezia. No unintentional weight loss. No abdominal pain.  3BMs/day.  Has stopped Dr. PepMalachi BondsHad problems adjusting her thyroid medications.  Has gained weight  Wt Readings from Last 3 Encounters:  08/30/21 267 lb (121.1 kg)  08/08/21 260 lb 3.2 oz (118 kg)  06/20/21 259 lb (117.5 kg)   She also has applied for disability.      From previous notes:  Had Biliary colic (RUQ pain) , underwent lap chole with neg IOC and liver Bx 05/10/2020. [facility].  She does have FH of NASH cirrhosis (mom).  Unfortunately, her mom has passed away due to complications from diabetes.  Could not tolerate lactulose due to diarrhea.  Denies having  any significant change in her mental status.  She continues to be on rifaximin.  No skin lesions, easy bruisability, intake of OTC meds including diet pills, herbal medications, anabolic steroids or Tylenol. There is no H/O blood transfusions, IVDA. No jaundice, dark urine or pale stools. No alcohol abuse.  No chocolates, chewing gums, artificial sweeteners and candy. No NSAIDs.  She would drink 4 Dr. PepSamson Fredericr day.  Has reduced it to 2/day.  Wt Readings from Last 3 Encounters:  08/30/21 267 lb (121.1 kg)  08/08/21 260 lb 3.2 oz (118 kg)  06/20/21 259 lb (117.5 kg)      Past GI procedures:  [place] 1. Cirrhotic morphology of the liver.  No focal mass identified. 2. The splenic length was measured at 11.7 cm today versus greater than 13 cm previously. I suspect the splenic length may not have been accurately measured today and visually, I am still concerned for splenomegaly although a volume was not obtained. 3. The common bile duct measures 6.7 mm which is mildly dilated, within normal limits given previous cholecystectomy.  Colon 07/2020 - One 6 mm polyp in the proximal ascending colon, removed with a cold snare. Resected and retrieved. Bx- TA - Four 6 to 8 mm polyps in the mid rectum and in the distal sigmoid colon, removed with a cold snare. Resected and retrieved. Bx-hyperplastic. - Non-bleeding internal hemorrhoids. - The examined portion of the ileum was normal. - The examination was otherwise normal on direct and retroflexion  views. - Negative random colonic biopsies.  EGD 07/2020 - Mild gastritis. - Salmon-colored mucosa projecting 1 cm above GE junction. Bx- neg for Barrett's. - No esophageal varices. - Negative small bowel biopsies for celiac disease.  CT Abdo/pelvis with contrast 06/09/2020 1. Morphologic features of the liver compatible with cirrhosis. No suspicious liver lesions identified. 2. Splenomegaly. 3. Aortic atherosclerosis.  [place] 10/2020 1.  Cirrhotic morphology of the liver. No focal liver lesion is identified. 2. Sequela of portal hypertension including splenomegaly. 3. Prior cholecystectomy. 4. No Ascites  Additional liver work-up -AFP 6.5 10/15/2020 -ANA 1:40 -Neg AMA, celiac serology, iron studies, alpha-1 antitrypsin, ASMA -Ammonia 55 -Not immune to hepatitis A/B, s/p vaccines    Additional GI history: Op note from lap chole 05/10/2020-reviewed Louanna Raw, MD General, Bariatric, & Minimally Invasive Surgery [facility], [place]) -Cirrhotic appearing liver. Bx-Liver Bx reviewed at [facility] showed steatohepatitis with stage IV cirrhosis. -IOC negative -No ascites or any obvious varices  Past Medical History:  Diagnosis Date   Allergies    Anxiety    Arthritis    Asthma    Back pain    Spinal Issue    Diabetes mellitus without complication (HCC)    DJD (degenerative joint disease), lumbar    Elevated cholesterol    Gallstone    GERD (gastroesophageal reflux disease)    Liver cirrhosis secondary to NASH ([place])    Neuromuscular disorder ([place])    Obesity    Oxygen deficiency    Thyroid disease     Past Surgical History:  Procedure Laterality Date   ABDOMINAL HYSTERECTOMY  02/21/2003   CHOLECYSTECTOMY N/A 05/10/2020   Procedure: LAPAROSCOPIC CHOLECYSTECTOMY WITH INTRAOPERATIVE CHOLANGIOGRAM AND LIVER BIOPSY;  Surgeon: Felicie Morn, MD;  [facility];  Service: General;  Laterality: N/A;   DG SACROILIAC JOINTS ([place] HX)  01/11/2021   [facility]   ENDOSCOPIC TURBINATE REDUCTION Bilateral 10/13/2020   Procedure: ENDOSCOPIC BILATERAL INFERIOR TURBINATE REDUCTIONS;  Surgeon: Rozetta Nunnery, MD;  Location: [place];  Service: ENT;  Laterality: Bilateral;   ETHMOIDECTOMY Bilateral 10/13/2020   Procedure: BILATERAL TOTAL ETHMOIDECTOMY AND MAXILLARY OSTIA ENLARGEMENTS;  Surgeon: Rozetta Nunnery, MD;  Location: [place];  Service: ENT;   Laterality: Bilateral;   LIVER BIOPSY     NASAL SINUS SURGERY Bilateral 10/13/2020   Procedure: SINUS ENDOSCOPY WITH STEALTH NAVIGATION;  Surgeon: Rozetta Nunnery, MD;  Location: [facility];  Service: ENT;  Laterality: Bilateral;   TONSILLECTOMY      Family History  Problem Relation Age of Onset   Coronary artery disease Mother    Diabetes Mother    COPD Mother    Coronary artery disease Father    Hypertension Father    Colon cancer Neg Hx    Esophageal cancer Neg Hx     Social History   Tobacco Use   Smoking status: Former    Packs/day: 0.50    Types: Cigarettes    Start date: 01/16/1985    Quit date: 10/08/2019    Years since quitting: 1.8   Smokeless tobacco: Never  Vaping Use   Vaping Use: Never used  Substance Use Topics   Alcohol use: No   Drug use: No    Current Outpatient Medications  Medication Sig Dispense Refill   albuterol (VENTOLIN HFA) 108 (90 Base) MCG/ACT inhaler Inhale 2 puffs into the lungs every 6 (six) hours as needed for wheezing or shortness of breath. 8 g 11  atorvastatin (LIPITOR) 10 MG tablet Take 1 tablet (10 mg total) by mouth daily. 90 tablet 3   cyclobenzaprine (FLEXERIL) 10 MG tablet Take 10 mg by mouth as needed.     DULoxetine (CYMBALTA) 60 MG capsule TAKE 2 CAPSULE BY MOUTH ONCE DAILY 180 capsule 1   FARXIGA 10 MG TABS tablet Take 10 mg by mouth daily.     Fluticasone-Umeclidin-Vilant (TRELEGY ELLIPTA) 100-62.5-25 MCG/ACT AEPB Inhale 1 puff into the lungs daily. 60 each 6   furosemide (LASIX) 20 MG tablet TAKE 1 TABLET BY MOUTH ONCE DAILY AS NEEDED 60 tablet 0   gabapentin (NEURONTIN) 300 MG capsule Take 1 capsule (300 mg total) by mouth as directed. 1 capsule in the morning and 2 pills at lunch and 3 pills at night 180 capsule 0   glipiZIDE (GLUCOTROL) 5 MG tablet Take 1 tablet (5 mg total) by mouth daily before breakfast AND 2 tablets (10 mg total) daily before supper. 270 tablet 3   ibuprofen (ADVIL) 600 MG tablet  Take 1 tablet (600 mg total) by mouth 2 (two) times daily as needed. 30 tablet 0   levocetirizine (XYZAL) 5 MG tablet Take 1 tablet (5 mg total) by mouth every evening. 90 tablet 3   levothyroxine (SYNTHROID) 150 MCG tablet Take 1 tablet (150 mcg total) by mouth daily. 90 tablet 3   lidocaine (LIDODERM) 5 % USE 1 PATCH EXTERNALLY ONCE DAILY REMOVE  AND  DISCARD  PATCH  WITH  12  HOURS  OR  AS  DIRECTED  BY  MD 30 patch 0   meloxicam (MOBIC) 15 MG tablet Take 15 mg by mouth daily.     metFORMIN (GLUCOPHAGE) 850 MG tablet TAKE 1 TABLET BY MOUTH TWICE DAILY WITH A MEAL 60 tablet 3   NONFORMULARY OR COMPOUNDED ITEM Mix: Amitriptyline 2%, Lidocaine 2%, and Ketamine 1% - apply on feet 2x a day 1 each 2   omeprazole (PRILOSEC) 40 MG capsule Take 1 capsule (40 mg total) by mouth daily. 90 capsule 3   triamcinolone (NASACORT) 55 MCG/ACT AERO nasal inhaler Place 2 sprays into the nose daily. 1 each 12   XIFAXAN 550 MG TABS tablet Take 1 tablet by mouth twice daily 60 tablet 12   No current facility-administered medications for this visit.    Allergies  Allergen Reactions   Clarithromycin Other (See Comments)    Body cramps    Codeine Nausea And Vomiting   Tylenol [Acetaminophen] Other (See Comments)    Can not take Tylenol products due to liver    Review of Systems:  Constitutional: Denies fever, chills, diaphoresis, appetite change and has fatigue.      Physical Exam:    BP 126/80   Pulse 84   Ht 5' 6" (1.676 m)   Wt 267 lb (121.1 kg)   BMI 43.09 kg/m  Wt Readings from Last 3 Encounters:  08/30/21 267 lb (121.1 kg)  08/08/21 260 lb 3.2 oz (118 kg)  06/20/21 259 lb (117.5 kg)   Constitutional:  Well-developed, in no acute distress. Psychiatric: Normal mood and affect. Behavior is normal. HEENT: Pupils normal.  Conjunctivae are normal. No scleral icterus.  Cardiovascular: Normal rate, regular rhythm. No edema Pulmonary/chest: Effort normal and breath sounds normal. No wheezing,  rales or rhonchi. Abdominal: Soft, nondistended. Nontender. Bowel sounds active throughout. There are no masses palpable.  Liver palpated 2 cm below costal margin.  No ascites. Rectal: Deferred Neurological: Alert and oriented to person place and time. Skin: Skin is  warm and dry. No rashes noted. Extremities-no edema.  Boot over left foot  Data Reviewed: I have personally reviewed following labs and imaging studies  CBC:    Latest Ref Rng & Units 05/10/2021    8:45 AM 02/11/2021    9:36 AM 10/20/2020    9:10 AM  CBC  WBC 4.0 - 10.5 K/uL 8.7  11.4  17.1   Hemoglobin 12.0 - 15.0 g/dL 11.9  12.4  12.6   Hematocrit 36.0 - 46.0 % 37.0  40.0  39.7   Platelets 150.0 - 400.0 K/uL 150.0   192.0     CMP:    Latest Ref Rng & Units 05/10/2021    8:45 AM 02/11/2021    9:36 AM 10/20/2020    9:10 AM  CMP  Glucose 70 - 99 mg/dL 216  165  152   BUN 6 - 23 mg/dL _0 Creatinine 0.40 - 1.20 mg/dL 0.52  0.61  0.60   Sodium 135 - 145 mEq/L 136  137  134   Potassium 3.5 - 5.1 mEq/L 4.1  4.3  4.3   Chloride 96 - 112 mEq/L 99  100  100   CO2 19 - 32 mEq/L _1 Calcium 8.4 - 10.5 mg/dL 9.1  8.9  9.0   Total Protein 6.0 - 8.3 g/dL 7.7  7.2  7.0   Total Bilirubin 0.2 - 1.2 mg/dL 0.5  0.6  1.0   Alkaline Phos 39 - 117 U/L 98  123  94   AST 0 - 37 U/L 32  29  27   ALT 0 - 35 U/L 29  30  41       Latest Ref Rng & Units 05/10/2021    8:45 AM 02/11/2021    9:36 AM 10/20/2020    9:10 AM  Hepatic Function  Total Protein 6.0 - 8.3 g/dL 7.7  7.2  7.0   Albumin 3.5 - 5.2 g/dL 3.7  3.8  3.8   AST 0 - 37 U/L 32  29  27   ALT 0 - 35 U/L 29  30  41   Alk Phosphatase 39 - 117 U/L 98  123  94   Total Bilirubin 0.2 - 1.2 mg/dL 0.5  0.6  1.0      Radiology Studies: No results found.     Carmell Austria, MD 08/30/2021, 9:37 AM  Cc: Wendie Agreste, MD

## 2021-08-30 NOTE — Patient Instructions (Signed)
_______________________________________________________  If you are age 50 or older, your body mass index should be between 23-30. Your Body mass index is 43.09 kg/m. If this is out of the aforementioned range listed, please consider follow up with your Primary Care Provider.  If you are age 89 or younger, your body mass index should be between 19-25. Your Body mass index is 43.09 kg/m. If this is out of the aformentioned range listed, please consider follow up with your Primary Care Provider.   ________________________________________________________  The [facility] [facility] providers would like to encourage you to use [facility] to communicate with providers for non-urgent requests or questions.  Due to long hold times on the telephone, sending your provider a message by [facility] may be a faster and more efficient way to get a response.  Please allow 48 business hours for a response.  Please remember that this is for non-urgent requests.  _______________________________________________________  Your provider has requested that you go to the basement level for lab work before leaving today. Press "B" on the elevator. The lab is located at the first door on the left as you exit the elevator.  Continue current medication  Try to lose 6 pounds over the next 3 months.  Please follow up in 6 months. Give [place] a call at [phone] to schedule an appointment.  Thank you,  Dr. Jackquline Denmark

## 2021-09-01 LAB — AFP TUMOR MARKER: AFP-Tumor Marker: 4.5 ng/mL

## 2021-09-07 ENCOUNTER — Encounter (HOSPITAL_COMMUNITY): Payer: Self-pay | Admitting: *Deleted

## 2021-09-07 ENCOUNTER — Other Ambulatory Visit: Payer: Self-pay

## 2021-09-07 ENCOUNTER — Ambulatory Visit (HOSPITAL_COMMUNITY)
Admission: EM | Admit: 2021-09-07 | Discharge: 2021-09-07 | Disposition: A | Discharge status: Home / Self Care | Payer: BC Managed Care – PPO | Attending: Physician Assistant | Admitting: Physician Assistant

## 2021-09-07 DIAGNOSIS — G609 Hereditary and idiopathic neuropathy, unspecified: Secondary | ICD-10-CM

## 2021-09-07 DIAGNOSIS — M79672 Pain in left foot: Secondary | ICD-10-CM

## 2021-09-07 DIAGNOSIS — M79671 Pain in right foot: Secondary | ICD-10-CM

## 2021-09-07 MED ORDER — TRAMADOL HCL 50 MG PO TABS: 100.0000 mg | ORAL_TABLET | Freq: Four times a day (QID) | ORAL | 0 refills | Status: DC | PRN

## 2021-09-07 NOTE — ED Triage Notes (Signed)
Bil. Foot pain caused by neuropathy. Has an Appt with MD next week

## 2021-09-07 NOTE — Discharge Instructions (Signed)
Advised to take the Ultram tablets 1-2 every 6-8 hours as needed for pain relief from the neuropathy. Advised to ensure to keep your appointment next week with the neurologist to have the neuropathy evaluated and possible changes to medical regimen. Advised to follow-up with PCP return to urgent care if symptoms fail to improve.

## 2021-09-07 NOTE — ED Provider Notes (Signed)
[place]    CSN: [id number] Arrival date & time: 09/07/21  1016      History   Chief Complaint Chief Complaint  Patient presents with   Foot Pain    HPI Teresa Castaneda is a 51 y.o. female.   51 year old female presents with neuropathy pain at the feet.  Patient returns today indicating she continues to have significant neuropathy of the feet bilaterally.  She indicates on a scale of 1-10 that the pain is a 8 to a 9.  Patient indicates that her pain usually bothers her most after she has been up working for 2 days in a row and then when she is off she has several days of intense pain to where she is not hardly able to walk.  Patient continues to take her medication on a regular basis but relates that the Cymbalta and the Neurontin tend to give very little relief.  Patient does indicate that she gets the most relief by taking the Ultram and she will usually take 1-2 a day to keep the pain and check however when she has exacerbations she has to take several a day in order to control her pain.   Patient indicates that she does have an appointment next week to see the neurologist and hopefully she will be able to get some changes in her medication regimen to get her neuropathy under control or she may be referred to the pain center for further medical therapy.  Patient denies fever or chills.   Foot Pain    Past Medical History:  Diagnosis Date   Allergies    Anxiety    Arthritis    Asthma    Back pain    Spinal Issue    Diabetes mellitus without complication (HCC)    DJD (degenerative joint disease), lumbar    Elevated cholesterol    Gallstone    GERD (gastroesophageal reflux disease)    Liver cirrhosis secondary to NASH ([place])    Neuromuscular disorder ([place])    Obesity    Oxygen deficiency    Thyroid disease     Patient Active Problem List   Diagnosis Date Noted   Cirrhosis of liver ([place]) 06/23/2020   Symptomatic cholelithiasis 05/10/2020   Acquired  hypothyroidism 04/05/2020   Weight gain 04/05/2020   Type 2 diabetes mellitus with hyperglycemia, without long-term current use of insulin ([place]) 04/05/2020   History of COVID-19 09/23/2019   Pneumonia due to COVID-19 virus 09/23/2019   Cough 09/23/2019   BMI 40.0-44.9, adult ([place]) 07/22/2019   Facet arthropathy, lumbar 07/22/2019   Sacroiliitis ([place]) 07/22/2019   ANXIETY 09/10/2006   DEPRESSION 09/10/2006   ASTHMA 09/10/2006   HEADACHE 09/10/2006    Past Surgical History:  Procedure Laterality Date   ABDOMINAL HYSTERECTOMY  02/21/2003   CHOLECYSTECTOMY N/A 05/10/2020   Procedure: LAPAROSCOPIC CHOLECYSTECTOMY WITH INTRAOPERATIVE CHOLANGIOGRAM AND LIVER BIOPSY;  Surgeon: Felicie Morn, MD;  [facility];  Service: General;  Laterality: N/A;   DG SACROILIAC JOINTS ([place] HX)  01/11/2021   [facility]   ENDOSCOPIC TURBINATE REDUCTION Bilateral 10/13/2020   Procedure: ENDOSCOPIC BILATERAL INFERIOR TURBINATE REDUCTIONS;  Surgeon: Rozetta Nunnery, MD;  Location: [place];  Service: ENT;  Laterality: Bilateral;   ETHMOIDECTOMY Bilateral 10/13/2020   Procedure: BILATERAL TOTAL ETHMOIDECTOMY AND MAXILLARY OSTIA ENLARGEMENTS;  Surgeon: Rozetta Nunnery, MD;  Location: [place];  Service: ENT;  Laterality: Bilateral;   LIVER BIOPSY     NASAL SINUS  SURGERY Bilateral 10/13/2020   Procedure: SINUS ENDOSCOPY WITH STEALTH NAVIGATION;  Surgeon: Rozetta Nunnery, MD;  Location: [place];  Service: ENT;  Laterality: Bilateral;   TONSILLECTOMY      OB History   No obstetric history on file.      Home Medications    Prior to Admission medications   Medication Sig Start Date End Date Taking? Authorizing Provider  traMADol (ULTRAM) 50 MG tablet Take 2 tablets (100 mg total) by mouth every 6 (six) hours as needed. 09/07/21  Yes Nyoka Lint, PA-C  albuterol (VENTOLIN HFA) 108 (90 Base) MCG/ACT inhaler Inhale 2  puffs into the lungs every 6 (six) hours as needed for wheezing or shortness of breath. 08/08/21   Freddi Starr, MD  atorvastatin (LIPITOR) 10 MG tablet Take 1 tablet (10 mg total) by mouth daily. 10/18/20   Wendie Agreste, MD  cyclobenzaprine (FLEXERIL) 10 MG tablet Take 10 mg by mouth as needed. 12/22/20   [provider]  DULoxetine (CYMBALTA) 60 MG capsule TAKE 2 CAPSULE BY MOUTH ONCE DAILY 05/02/21   Wendie Agreste, MD  FARXIGA 10 MG TABS tablet Take 10 mg by mouth daily. 12/04/20   [provider]  Fluticasone-Umeclidin-Vilant (TRELEGY ELLIPTA) 100-62.5-25 MCG/ACT AEPB Inhale 1 puff into the lungs daily. 08/08/21   Freddi Starr, MD  furosemide (LASIX) 20 MG tablet TAKE 1 TABLET BY MOUTH ONCE DAILY AS NEEDED 08/25/21   Wendie Agreste, MD  gabapentin (NEURONTIN) 300 MG capsule Take 1 capsule (300 mg total) by mouth as directed. 1 capsule in the morning and 2 pills at lunch and 3 pills at night 07/24/21   Nyoka Lint, PA-C  glipiZIDE (GLUCOTROL) 5 MG tablet Take 1 tablet (5 mg total) by mouth daily before breakfast AND 2 tablets (10 mg total) daily before supper. 06/20/21   Shamleffer, Melanie Crazier, MD  ibuprofen (ADVIL) 600 MG tablet Take 1 tablet (600 mg total) by mouth 2 (two) times daily as needed. 04/15/21   Thurman Coyer, DO  levocetirizine (XYZAL) 5 MG tablet Take 1 tablet (5 mg total) by mouth every evening. 05/02/21   Wendie Agreste, MD  levothyroxine (SYNTHROID) 150 MCG tablet Take 1 tablet (150 mcg total) by mouth daily. 04/20/21   Shamleffer, Melanie Crazier, MD  lidocaine (LIDODERM) 5 % USE 1 PATCH EXTERNALLY ONCE DAILY REMOVE  AND  DISCARD  PATCH  WITH  12  HOURS  OR  AS  DIRECTED  BY  MD 08/11/20   Wendie Agreste, MD  meloxicam (MOBIC) 15 MG tablet Take 15 mg by mouth daily. 08/19/21   [provider]  metFORMIN (GLUCOPHAGE) 850 MG tablet TAKE 1 TABLET BY MOUTH TWICE DAILY WITH A MEAL 07/14/21   Shamleffer, Melanie Crazier, MD  NONFORMULARY  OR COMPOUNDED ITEM Mix: Amitriptyline 2%, Lidocaine 2%, and Ketamine 1% - apply on feet 2x a day 08/22/21   Philemon Kingdom, MD  omeprazole (PRILOSEC) 40 MG capsule Take 1 capsule (40 mg total) by mouth daily. 10/20/20   Jackquline Denmark, MD  triamcinolone (NASACORT) 55 MCG/ACT AERO nasal inhaler Place 2 sprays into the nose daily. 05/02/21   Wendie Agreste, MD  XIFAXAN 550 MG TABS tablet Take 1 tablet by mouth twice daily 11/08/20   Jackquline Denmark, MD  azelastine (ASTELIN) 0.1 % nasal spray Place 2 sprays into both nostrils 2 (two) times daily. 04/23/19 12/26/19  Ok Edwards, PA-C    Family History Family History  Problem  Relation Age of Onset   Coronary artery disease Mother    Diabetes Mother    COPD Mother    Coronary artery disease Father    Hypertension Father    Colon cancer Neg Hx    Esophageal cancer Neg Hx     Social History Social History   Tobacco Use   Smoking status: Former    Packs/day: 0.50    Types: Cigarettes    Start date: 01/16/1985    Quit date: 10/08/2019    Years since quitting: 1.9   Smokeless tobacco: Never  Vaping Use   Vaping Use: Never used  Substance Use Topics   Alcohol use: No   Drug use: No     Allergies   Clarithromycin, Codeine, and Tylenol [acetaminophen]   Review of Systems Review of Systems  Musculoskeletal:  Positive for gait problem (bilat feet pain).     Physical Exam Triage Vital Signs ED Triage Vitals  Enc Vitals Group     BP 09/07/21 1135 (!) 142/87     Pulse Rate 09/07/21 1135 92     Resp 09/07/21 1135 20     Temp 09/07/21 1135 97.8 F (36.6 C)     Temp src --      SpO2 09/07/21 1135 97 %     Weight --      Height --      Head Circumference --      Peak Flow --      Pain Score 09/07/21 1136 10     Pain Loc --      Pain Edu? --      Excl. in [place]? --    No data found.  Updated Vital Signs BP (!) 142/87   Pulse 92   Temp 97.8 F (36.6 C)   Resp 20   SpO2 97%   Visual Acuity Right Eye Distance:   Left Eye  Distance:   Bilateral Distance:    Right Eye Near:   Left Eye Near:    Bilateral Near:     Physical Exam Constitutional:      Appearance: Normal appearance.  Musculoskeletal:       Feet:  Feet:     Comments: Bilateral feet: Both feet are normal in appearance without any redness, swelling, there is significant tenderness on palpation of the plantar surface but particularly associated at the toes and distal plantar pad area.  Range of motion is normal Neurological:     Mental Status: She is alert.      UC Treatments / Results  Labs (all labs ordered are listed, but only abnormal results are displayed) Labs Reviewed - No data to display  EKG   Radiology No results found.  Procedures Procedures (including critical care time)  Medications Ordered in UC Medications - No data to display  Initial Impression / Assessment and Plan / UC Course  I have reviewed the triage vital signs and the nursing notes.  Pertinent labs & imaging results that were available during my care of the patient were reviewed by me and considered in my medical decision making (see chart for details).    Plan: 1.  Advised to take Ultram 1 to 2 tablets every 6-8 hours as needed for pain relief.  Advised to use sparingly. 2.  Advised to follow-up with neurologist in order to have medication regimen adjusted or possible referral to the pain center. 3.  Advised to follow-up with PCP or return to urgent care if symptoms fail to improve.  Final Clinical Impressions(s) / UC Diagnoses   Final diagnoses:  Foot pain, right  Foot pain, left  Hereditary and idiopathic peripheral neuropathy     Discharge Instructions      Advised to take the Ultram tablets 1-2 every 6-8 hours as needed for pain relief from the neuropathy. Advised to ensure to keep your appointment next week with the neurologist to have the neuropathy evaluated and possible changes to medical regimen. Advised to follow-up with PCP return  to urgent care if symptoms fail to improve.    ED Prescriptions     Medication Sig Dispense Auth. Provider   traMADol (ULTRAM) 50 MG tablet Take 2 tablets (100 mg total) by mouth every 6 (six) hours as needed. 24 tablet Nyoka Lint, PA-C      PDMP not reviewed this encounter.   Nyoka Lint, PA-C 09/07/21 1205

## 2021-09-08 NOTE — Progress Notes (Signed)
Initial neurology clinic note  SERVICE DATE: 09/13/21 SERVICE TIME: 9:30 am  Reason for Evaluation: Consultation requested by Wendie Agreste, MD for an opinion regarding foot and leg pain. My final recommendations will be communicated back to the requesting physician by way of shared medical record or letter to requesting physician via [place] mail.  HPI: This is [name], a 51 y.o. right-handed female with a medical history of DM c/b diabetic neuropathy, hypothyroidism, OA, degenerative disk disease, NASH cirrhosis, HLD, asthma, anxiety, former smoker who presents to neurology clinic with the chief complaint of foot and leg pain (left worse than right). The patient is alone today.  Patient has burning and "lightening like pain" in bilateral feet. It is about the same in both feet. The majority of the pain is in the distal half of the feet. It makes it hard for the patient to walk. She has been to Urgent Care 3 times lately and been given tramadol. This eases the pain some. Her pain is a 9/10 today, but can ease up to 4/10. If she stands, it gets worse. Due to this pain, she is less active and gaining weight. Any cold used to help, but now makes her feet burn worse. The pain is worse at night. She finds it hard to sleep. She denies weakness. She has occasional numbness/tingling in hands that she has been told is carpal tunnel syndrome.  She inspects her feet daily and lotions them nightly. Of note, she broke her left foot about 1 year ago (in 3 spots).   Patient has previously seen by neurology in [facility] for diabetic neuropathy. She had an EMG that showed nerve damage per patient.   She is on Cymbalta 60-120 mg at night (told to take 1-2 tabs, but usually only take 1, and Gabapentin 300/600/900 (seems to work because if she doesn't take it, she hurts worse). She sees Dr. Kelton Pillar in endocrinology for DM and hypothyroidism. At office visit on 06/20/21, patient mentioned poor sleep  due to neuropathy, so patient was referred to neurology.  She has tried various creams in the past, including over the counter lidocaine cream. This helped for the first two weeks but then did not work.  The patient has not noticed any recent skin rashes nor does she report any constitutional symptoms like fever, night sweats, anorexia or unintentional weight loss.  EtOH use: None  Restrictive diet? No, does not eat a healthy diet most of the time Family history of neuropathy/myopathy/NM disease? Mother had diabetic neuropathy   MEDICATIONS:  Outpatient Encounter Medications as of 09/13/2021  Medication Sig   albuterol (VENTOLIN HFA) 108 (90 Base) MCG/ACT inhaler Inhale 2 puffs into the lungs every 6 (six) hours as needed for wheezing or shortness of breath.   atorvastatin (LIPITOR) 10 MG tablet Take 1 tablet (10 mg total) by mouth daily.   cyclobenzaprine (FLEXERIL) 10 MG tablet Take 10 mg by mouth as needed.   DULoxetine (CYMBALTA) 60 MG capsule TAKE 2 CAPSULE BY MOUTH ONCE DAILY   FARXIGA 10 MG TABS tablet Take 10 mg by mouth daily.   Fluticasone-Umeclidin-Vilant (TRELEGY ELLIPTA) 100-62.5-25 MCG/ACT AEPB Inhale 1 puff into the lungs daily.   furosemide (LASIX) 20 MG tablet TAKE 1 TABLET BY MOUTH ONCE DAILY AS NEEDED   gabapentin (NEURONTIN) 300 MG capsule Take 1 capsule (300 mg total) by mouth as directed. 1 capsule in the morning and 2 pills at lunch and 3 pills at night   glipiZIDE (GLUCOTROL)  5 MG tablet Take 1 tablet (5 mg total) by mouth daily before breakfast AND 2 tablets (10 mg total) daily before supper.   ibuprofen (ADVIL) 600 MG tablet Take 1 tablet (600 mg total) by mouth 2 (two) times daily as needed.   levothyroxine (SYNTHROID) 150 MCG tablet Take 1 tablet (150 mcg total) by mouth daily.   lidocaine (LIDODERM) 5 % USE 1 PATCH EXTERNALLY ONCE DAILY REMOVE  AND  DISCARD  PATCH  WITH  12  HOURS  OR  AS  DIRECTED  BY  MD   meloxicam (MOBIC) 15 MG tablet Take 15 mg by mouth  daily. PRN   metFORMIN (GLUCOPHAGE) 850 MG tablet TAKE 1 TABLET BY MOUTH TWICE DAILY WITH A MEAL   omeprazole (PRILOSEC) 40 MG capsule Take 1 capsule (40 mg total) by mouth daily.   traMADol (ULTRAM) 50 MG tablet Take 2 tablets (100 mg total) by mouth every 6 (six) hours as needed.   triamcinolone (NASACORT) 55 MCG/ACT AERO nasal inhaler Place 2 sprays into the nose daily.   XIFAXAN 550 MG TABS tablet Take 1 tablet by mouth twice daily   levocetirizine (XYZAL) 5 MG tablet Take 1 tablet (5 mg total) by mouth every evening. (Patient not taking: Reported on 09/13/2021)   NONFORMULARY OR COMPOUNDED ITEM Mix: Amitriptyline 2%, Lidocaine 2%, and Ketamine 1% - apply on feet 2x a day (Patient not taking: Reported on 09/13/2021)   [DISCONTINUED] azelastine (ASTELIN) 0.1 % nasal spray Place 2 sprays into both nostrils 2 (two) times daily.   No facility-administered encounter medications on file as of 09/13/2021.    PAST MEDICAL HISTORY: Past Medical History:  Diagnosis Date   Allergies    Anxiety    Arthritis    Asthma    Back pain    Spinal Issue    Diabetes mellitus without complication (HCC)    DJD (degenerative joint disease), lumbar    Elevated cholesterol    Gallstone    GERD (gastroesophageal reflux disease)    Liver cirrhosis secondary to NASH ([place])    Neuromuscular disorder ([place])    Obesity    Oxygen deficiency    Thyroid disease     PAST SURGICAL HISTORY: Past Surgical History:  Procedure Laterality Date   ABDOMINAL HYSTERECTOMY  02/21/2003   CHOLECYSTECTOMY N/A 05/10/2020   Procedure: LAPAROSCOPIC CHOLECYSTECTOMY WITH INTRAOPERATIVE CHOLANGIOGRAM AND LIVER BIOPSY;  Surgeon: Felicie Morn, MD;  Location: [facility];  Service: General;  Laterality: N/A;   DG SACROILIAC JOINTS ([place] HX)  01/11/2021   [facility]   ENDOSCOPIC TURBINATE REDUCTION Bilateral 10/13/2020   Procedure: ENDOSCOPIC BILATERAL INFERIOR TURBINATE REDUCTIONS;  Surgeon: Rozetta Nunnery, MD;   Location: [facility];  Service: ENT;  Laterality: Bilateral;   ETHMOIDECTOMY Bilateral 10/13/2020   Procedure: BILATERAL TOTAL ETHMOIDECTOMY AND MAXILLARY OSTIA ENLARGEMENTS;  Surgeon: Rozetta Nunnery, MD;  Location: [facility];  Service: ENT;  Laterality: Bilateral;   LIVER BIOPSY     NASAL SINUS SURGERY Bilateral 10/13/2020   Procedure: SINUS ENDOSCOPY WITH STEALTH NAVIGATION;  Surgeon: Rozetta Nunnery, MD;  Location: [facility];  Service: ENT;  Laterality: Bilateral;   TONSILLECTOMY      ALLERGIES: Allergies  Allergen Reactions   Clarithromycin Other (See Comments)    Body cramps    Codeine Nausea And Vomiting   Tylenol [Acetaminophen] Other (See Comments)    Can not take Tylenol products due to liver    FAMILY HISTORY: Family History  Problem Relation Age of Onset   Coronary artery disease Mother    Diabetes Mother    COPD Mother    Coronary artery disease Father    Hypertension Father    Colon cancer Neg Hx    Esophageal cancer Neg Hx     SOCIAL HISTORY: Social History   Tobacco Use   Smoking status: Former    Packs/day: 0.50    Types: Cigarettes    Start date: 01/16/1985    Quit date: 10/08/2019    Years since quitting: 1.9   Smokeless tobacco: Never   Tobacco comments:    7- 8 cig a week  Vaping Use   Vaping Use: Never used  Substance Use Topics   Alcohol use: No   Drug use: No   Social History   Social History Narrative   Right Handed    Caffeine rare   Live with husband and mother in law in a two story hpme     OBJECTIVE: PHYSICAL EXAM: BP 103/69   Pulse (!) 104   Ht 5' 6"  (1.676 m)   Wt 254 lb (115.2 kg)   SpO2 95%   BMI 41.00 kg/m   General: General appearance: Awake and alert. No distress. Cooperative with exam.  Skin: No obvious rash or jaundice. HEENT: Atraumatic. Anicteric. Lungs: Non-labored breathing on room air  Psych: Affect appropriate.  Neurological: Mental  Status: Alert. Speech fluent. No pseudobulbar affect Cranial Nerves: CNII: No RAPD. Visual fields intact. CNIII, IV, VI: PERRL. No nystagmus. EOMI. CN V: Facial sensation intact bilaterally to fine touch. CN VII: Facial muscles symmetric and strong. No ptosis at rest. CN VIII: Hears finger rub well bilaterally. CN IX: No hypophonia. CN X: Palate elevates symmetrically. CN XI: Full strength shoulder shrug bilaterally. CN XII: Tongue protrusion full and midline. No atrophy or fasciculations. No significant dysarthria Motor: Tone is normal. No fasciculations in any extremities. No atrophy.  Individual muscle group testing (MRC grade out of 5):  Movement     Neck flexion 5    Neck extension 5     Right Left   Shoulder abduction 5 5   Shoulder adduction 5 5   Shoulder ext rotation 5 5   Shoulder int rotation 5 5   Elbow flexion 5 5   Elbow extension 5 5   Wrist extension 5 5   Wrist flexion 5 5   Finger abduction - FDI 5 5   Finger abduction - ADM 5 5   Finger extension 5 5   Finger distal flexion - 2/3 5 5    Finger distal flexion - 4/5 5 5    Thumb flexion - FPL 5 5   Thumb abduction - APB 5 5    Hip flexion 5 5   Hip extension 5 5   Hip adduction 5 5   Hip abduction 5 5   Knee extension 5 5   Knee flexion 5 5   Dorsiflexion 5 5   Plantarflexion 5 5   Inversion 5 5   Eversion 5 5   Great toe extension 5 5   Great toe flexion 5 5     Reflexes:  Right Left   Bicep 2+ 2+   Tricep 2+ 2+   BrRad 2+ 2+   Knee 2+ 2+   Ankle 2+ 2+    Pathological Reflexes: Babinski: mute response bilaterally Hoffman: absent bilaterally Troemner: absent bilaterally  Sensation: Pinprick: Reduced in bilateral feet with increasing gradient to ankle Vibration: Reduced in  bilateral feet (3 seconds) and ankles (7 seconds on left, 9 seconds on right) Temperature: Increased temperature sensation in bilateral feet Proprioception: Intact in bilateral great toes. Coordination: Intact  finger-to- nose-finger bilaterally. Romberg negative. Gait: Able to rise from chair with arms crossed unassisted. Narrow-based gait, cautious, slow steps.  Lab and Test Review: Internal labs: Component     Latest Ref Rng 08/30/2021  WBC     4.0 - 10.5 K/uL 5.3   RBC     3.87 - 5.11 Mil/uL 4.72   Hemoglobin     12.0 - 15.0 g/dL 10.4 (L)   HCT     36.0 - 46.0 % 33.4 (L)   MCV     78.0 - 100.0 fl 70.8 (L)   MCHC     30.0 - 36.0 g/dL 31.1   RDW     11.5 - 15.5 % 19.2 (H)   Platelets     150.0 - 400.0 K/uL 110.0 (L)   Neutrophils     43.0 - 77.0 % 50.1   Lymphocytes     12.0 - 46.0 % 37.6   Monocytes Relative     3.0 - 12.0 % 6.4   Eosinophil     0.0 - 5.0 % 5.2 (H)   Basophil     0.0 - 3.0 % 0.7   NEUT#     1.4 - 7.7 K/uL 2.7   Lymphocyte #     0.7 - 4.0 K/uL 2.0   Monocyte #     0.1 - 1.0 K/uL 0.3   Eosinophils Absolute     0.0 - 0.7 K/uL 0.3   Basophils Absolute     0.0 - 0.1 K/uL 0.0     Legend: (L) Low (H) High  Component     Latest Ref Rng 08/30/2021  Sodium     135 - 145 mEq/L 138   Potassium     3.5 - 5.1 mEq/L 3.9   Chloride     96 - 112 mEq/L 103   CO2     19 - 32 mEq/L 27   Glucose     70 - 99 mg/dL 142 (H)   BUN     6 - 23 mg/dL 6   Creatinine     0.40 - 1.20 mg/dL 0.49   Total Bilirubin     0.2 - 1.2 mg/dL 0.7   Alkaline Phosphatase     39 - 117 U/L 81   AST     0 - 37 U/L 39 (H)   ALT     0 - 35 U/L 29   Total Protein     6.0 - 8.3 g/dL 7.3   Albumin     3.5 - 5.2 g/dL 3.5   GFR     >60.00 mL/min 110.41   Calcium     8.4 - 10.5 mg/dL 9.0     Legend: (H) High  TSH (08/30/21): 35.56 HbA1c (08/30/21): 9.1  ANA titer (06/02/20): 1:40 Normal or unremarkable: mitochondrial abs, anti-smooth muscle ab, alpha-1 antitrypsin, ceruloplasmin, Celiac panel Iron studies: iron 105, TIBC 445, %sat 24, ferritin 16  MRI lumbar spine (07/04/20): FINDINGS: Segmentation: There are five lumbar type vertebral bodies. The last full  intervertebral disc space is labeled L5-S1.   Alignment:  Normal   Vertebrae:  Normal marrow signal.  No bone lesions or fractures.   Conus medullaris and cauda equina: Conus extends to the L1 level. Conus and cauda equina appear normal.   Paraspinal and  other soft tissues: No significant paraspinal or retroperitoneal findings.   Disc levels:   No lumbar disc protrusions, spinal or foraminal stenosis. No significant lumbar facet disease. No pars defects.   IMPRESSION: Unremarkable lumbar spine MRI examination.  ASSESSMENT: Teresa Castaneda is a 51 y.o. female who presents for evaluation of pain in bilateral feet. She has a relevant medical history of poorly controlled DM c/b diabetic neuropathy, hypothyroidism, OA, degenerative disk disease, NASH cirrhosis, HLD, asthma, anxiety, former smoker. Her neurological examination is pertinent for abnormal sensation in bilateral feet with gait difficulties due to foot pain. Available diagnostic data is significant for recent HbA1c of 9.0.   Patient's pain is likely multifactorial. 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 We discussed that the best treatment and prevention of worsening neuropathy would be to better control her diabetes. Patient also had a left foot fracture last year that is likely contributing. She may also have metatarsalgia as her pain seems to be concentrated from ball of feet.   Patient was not interested in adjusting nerve pain medications or trying more creams, but wanted other options such as narcotics to help when the pain was extreme. I explained that this would not help with nerve pain and that I did not prescribe narcotics. Due to history of foot fracture, she may need other pain control options though, so she was open to pain management referral to discuss options they may have.  PLAN: -Blood work: B12, SPEP, IFE to evaluate for other treatable  causes of neuropathy -Discussed the importance of controlling diabetes and following closely with PCP and endocrine -Pain management referral  -Return to clinic as needed  The impression above as well as the plan as outlined below were extensively discussed with the patient who voiced understanding. All questions were answered to their satisfaction.  The patient was counseled on pertinent fall precautions per the printed material provided today, and as noted under the "Patient Instructions" section below.  When available, results of the above investigations and possible further recommendations will be communicated to the patient via telephone/MyChart. Patient to call office if not contacted after expected testing turnaround time.   Total time spent reviewing records, interview, history/exam, documentation, and coordination of care on day of encounter:  64 min   Thank you for allowing me to participate in patient's care.  If I can answer any additional questions, I would be pleased to do so.  Kai Levins, MD   CC: Wendie Agreste, MD [address] [place] [address] [place] [address]  CC: Referring provider: Wendie Agreste, MD [address] [place] [address] [place],  [place] [address]

## 2021-09-09 ENCOUNTER — Encounter: Payer: Self-pay | Admitting: Internal Medicine

## 2021-09-13 ENCOUNTER — Ambulatory Visit: Payer: BC Managed Care – PPO | Admitting: Neurology

## 2021-09-13 ENCOUNTER — Other Ambulatory Visit (INDEPENDENT_AMBULATORY_CARE_PROVIDER_SITE_OTHER): Payer: BC Managed Care – PPO

## 2021-09-13 ENCOUNTER — Encounter: Payer: Self-pay | Admitting: Neurology

## 2021-09-13 VITALS — BP 103/69 | HR 104 | Ht 66.0 in | Wt 254.0 lb

## 2021-09-13 DIAGNOSIS — M79671 Pain in right foot: Secondary | ICD-10-CM | POA: Diagnosis not present

## 2021-09-13 DIAGNOSIS — E1142 Type 2 diabetes mellitus with diabetic polyneuropathy: Secondary | ICD-10-CM

## 2021-09-13 DIAGNOSIS — M79672 Pain in left foot: Secondary | ICD-10-CM | POA: Diagnosis not present

## 2021-09-13 DIAGNOSIS — M7741 Metatarsalgia, right foot: Secondary | ICD-10-CM

## 2021-09-13 DIAGNOSIS — S92902S Unspecified fracture of left foot, sequela: Secondary | ICD-10-CM

## 2021-09-13 DIAGNOSIS — M7742 Metatarsalgia, left foot: Secondary | ICD-10-CM

## 2021-09-13 LAB — VITAMIN B12: Vitamin B-12: 448 pg/mL (ref 211–911)

## 2021-09-13 NOTE — Patient Instructions (Addendum)
I saw you today for foot pain. Your pain seems most like small fiber neuropathy because of diabetes. You may also have local inflammation in your foot that is causing pain in the ball of your foot.  I will get some lab work today to ensure there is no other treatable cause of neuropathy. You will get these today. I will be in touch when I have the results of your testing.  I am limited in options due to the medications you are currently taking and the ones you have previously tried. I can refer to pain management for consideration of other options due to the previous foot fracture and pain in the ball of your foot.  Controlling your diabetes would be the best treatment to make sure any neuropathy does not worsen.  Follow up with me as needed.  Kai Levins, MD   Preventing Falls at [facility] are common, often dreaded events in the lives of older people. Aside from the obvious injuries and even death that may result, fall can cause wide-ranging consequences including loss of independence, mental decline, decreased activity and mobility. Younger people are also at risk of falling, especially those with chronic illnesses and fatigue.  Ways to reduce risk for falling Examine diet and medications. Warm foods and alcohol dilate blood vessels, which can lead to dizziness when standing. Sleep aids, antidepressants and pain medications can also increase the likelihood of a fall.  Get a vision exam. Poor vision, cataracts and glaucoma increase the chances of falling.  Check foot gear. Shoes should fit snugly and have a sturdy, nonskid sole and a broad, low heel  Participate in a physician-approved exercise program to build and maintain muscle strength and improve balance and coordination. Programs that use ankle weights or stretch bands are excellent for muscle-strengthening. Water aerobics programs and low-impact Tai Chi programs have also been shown to improve balance and coordination.  Increase  vitamin D intake. Vitamin D improves muscle strength and increases the amount of calcium the body is able to absorb and deposit in bones.  How to prevent falls from common hazards Floors - Remove all loose wires, cords, and throw rugs. Minimize clutter. Make sure rugs are anchored and smooth. Keep furniture in its usual place.  Chairs -- Use chairs with straight backs, armrests and firm seats. Add firm cushions to existing pieces to add height.  Bathroom - Install grab bars and non-skid tape in the tub or shower. Use a bathtub transfer bench or a shower chair with a back support Use an elevated toilet seat and/or safety rails to assist standing from a low surface. Do not use towel racks or bathroom tissue holders to help you stand.  Lighting - Make sure halls, stairways, and entrances are well-lit. Install a night light in your bathroom or hallway. Make sure there is a light switch at the top and bottom of the staircase. Turn lights on if you get up in the middle of the night. Make sure lamps or light switches are within reach of the bed if you have to get up during the night.  Kitchen - Install non-skid rubber mats near the sink and stove. Clean spills immediately. Store frequently used utensils, pots, pans between waist and eye level. This helps prevent reaching and bending. Sit when getting things out of lower cupboards.  Living room/ Bedrooms - Place furniture with wide spaces in between, giving enough room to move around. Establish a route through the living room that gives you  something to hold onto as you walk.  Stairs - Make sure treads, rails, and rugs are secure. Install a rail on both sides of the stairs. If stairs are a threat, it might be helpful to arrange most of your activities on the lower level to reduce the number of times you must climb the stairs.  Entrances and doorways - Install metal handles on the walls adjacent to the doorknobs of all doors to make it more secure as you  travel through the doorway.  Tips for maintaining balance Keep at least one hand free at all times. Try using a backpack or fanny pack to hold things rather than carrying them in your hands. Never carry objects in both hands when walking as this interferes with keeping your balance.  Attempt to swing both arms from front to back while walking. This might require a conscious effort if Parkinson's disease has diminished your movement. It will, however, help you to maintain balance and posture, and reduce fatigue.  Consciously lift your feet off of the ground when walking. Shuffling and dragging of the feet is a common culprit in losing your balance.  When trying to navigate turns, use a "U" technique of facing forward and making a wide turn, rather than pivoting sharply.  Try to stand with your feet shoulder-length apart. When your feet are close together for any length of time, you increase your risk of losing your balance and falling.  Do one thing at a time. Don't try to walk and accomplish another task, such as reading or looking around. The decrease in your automatic reflexes complicates motor function, so the less distraction, the better.  Do not wear rubber or gripping soled shoes, they might "catch" on the floor and cause tripping.  Move slowly when changing positions. Use deliberate, concentrated movements and, if needed, use a grab bar or walking aid. Count 15 seconds between each movement. For example, when rising from a seated position, wait 15 seconds after standing to begin walking.  If balance is a continuous problem, you might want to consider a walking aid such as a cane, walking stick, or walker. Once you've mastered walking with help, you might be ready to try it on your own again.

## 2021-09-14 ENCOUNTER — Encounter: Payer: Self-pay | Admitting: Physical Medicine and Rehabilitation

## 2021-09-15 ENCOUNTER — Encounter: Payer: Self-pay | Admitting: Family Medicine

## 2021-09-15 ENCOUNTER — Ambulatory Visit (INDEPENDENT_AMBULATORY_CARE_PROVIDER_SITE_OTHER): Payer: BC Managed Care – PPO | Admitting: Family Medicine

## 2021-09-15 ENCOUNTER — Telehealth: Payer: Self-pay

## 2021-09-15 VITALS — BP 124/82 | HR 98 | Temp 98.3°F | Ht 66.0 in | Wt 253.4 lb

## 2021-09-15 DIAGNOSIS — R051 Acute cough: Secondary | ICD-10-CM

## 2021-09-15 DIAGNOSIS — F411 Generalized anxiety disorder: Secondary | ICD-10-CM

## 2021-09-15 DIAGNOSIS — E039 Hypothyroidism, unspecified: Secondary | ICD-10-CM | POA: Diagnosis not present

## 2021-09-15 DIAGNOSIS — R0981 Nasal congestion: Secondary | ICD-10-CM | POA: Diagnosis not present

## 2021-09-15 DIAGNOSIS — J3489 Other specified disorders of nose and nasal sinuses: Secondary | ICD-10-CM

## 2021-09-15 DIAGNOSIS — R0982 Postnasal drip: Secondary | ICD-10-CM | POA: Diagnosis not present

## 2021-09-15 DIAGNOSIS — D696 Thrombocytopenia, unspecified: Secondary | ICD-10-CM

## 2021-09-15 DIAGNOSIS — E1140 Type 2 diabetes mellitus with diabetic neuropathy, unspecified: Secondary | ICD-10-CM

## 2021-09-15 DIAGNOSIS — E1165 Type 2 diabetes mellitus with hyperglycemia: Secondary | ICD-10-CM | POA: Diagnosis not present

## 2021-09-15 MED ORDER — DULOXETINE HCL 60 MG PO CPEP: 60.0000 mg | ORAL_CAPSULE | Freq: Every day | ORAL | 1 refills | Status: DC

## 2021-09-15 MED ORDER — IPRATROPIUM BROMIDE 0.06 % NA SOLN: 1.0000 | Freq: Four times a day (QID) | NASAL | 5 refills | Status: DC

## 2021-09-15 MED ORDER — DOXYCYCLINE HYCLATE 100 MG PO TABS: 100.0000 mg | ORAL_TABLET | Freq: Two times a day (BID) | ORAL | 0 refills | Status: DC

## 2021-09-15 NOTE — Progress Notes (Signed)
Subjective:  Patient ID: Teresa Castaneda, female    DOB: 10/08/1970  Age: 51 y.o. MRN: [id number]  CC:  Chief Complaint  Patient presents with   Diabetes    Pt states she knows her A1C is up along with her thyroid   Cough    Pt states she has a cough for 2 months that wont go away, she has tried allergy meds and sinus spray    HPI Teresa Castaneda presents for   Diabetes: Recent elevated A!c at Dr. Lyndel Safe.  Complicated by neuropathy, hyperglycemia. Treated by endocrinology, Dr. Kelton Pillar.  Last office visit June 5, glipizide increased but not morning dose due to hypoglycemia mid afternoon.  Intolerant to higher doses of metformin.  Continued Farxiga. Ozempic discussed by other specialists - will need to meet in person with endocrinologist - will see in October.  Has been taking diabetes meds routinely.  History of diabetic neuropathy, seen by neurology regarding ongoing foot and leg pain.  Visit 09/13/21.  Pain thought to be multifactorial.  Component of neuropathy with diabetes, plan for improved control of diabetes to prevent worsening neuropathy.  Previous left foot fracture also thought to be contributing.  Metatarsalgia also possible contribution.  Per note from neurology patient not interested in adjusting her pain medications or try more creams but option of narcotic.  Was referred to pain management.   She currently takes Cymbalta 60 mg daily for depression - too sedated on 165m. .Marland Kitchen Needs refill. ne Gabapentin 300 mg - 1 in morning, 2 at noon, 3 at night. Most symptomatic at night or with 1st step in am. No side effects with current dosing.   Hypothyroidism: Lab Results  Component Value Date   TSH 35.56 (H) 08/30/2021  Recent elevated TSH on August 15.  Notes reviewed, inconsistent dosing of medication - missed 6-7 doses. Had trouble with feet, trouble getting to med but able to do so now.  Taking medication daily.  No new hot or cold intolerance. Weight increased off thyroid  med, then improved back on med. Appt with endocrine 10/6.    Cough: Past 2 months.  Similar postnasal drip cough.  Persistent sinus drainage throughout the day.  No fever, no dyspnea or chest pain.  Some sinus pain at times, yellow discharge. Some headaches at times.  Taking nasacort in each nostril BID.  Xyzal 567mqd.  Saline NS bid.  Prior sinus surgery with ENT. No recent sinus treatment - doxycycline worked in past.   History Patient Active Problem List   Diagnosis Date Noted   Cirrhosis of liver ([facility]06/08/2020   Symptomatic cholelithiasis 05/10/2020   Acquired hypothyroidism 04/05/2020   Weight gain 04/05/2020   Type 2 diabetes mellitus with hyperglycemia, without long-term current use of insulin ([facility]03/21/2022   History of COVID-19 09/23/2019   Pneumonia due to COVID-19 virus 09/23/2019   Cough 09/23/2019   BMI 40.0-44.9, adult ([facility]07/06/2019   Facet arthropathy, lumbar 07/22/2019   Sacroiliitis ([facility]07/06/2019   ANXIETY 09/10/2006   DEPRESSION 09/10/2006   ASTHMA 09/10/2006   HEADACHE 09/10/2006   Past Medical History:  Diagnosis Date   Allergies    Anxiety    Arthritis    Asthma    Back pain    Spinal Issue    Diabetes mellitus without complication (HCC)    DJD (degenerative joint disease), lumbar    Elevated cholesterol    Gallstone    GERD (gastroesophageal reflux disease)    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[facility])    Neuromuscular disorder ([place])    Obesity    Oxygen deficiency    Thyroid disease    Past Surgical History:  Procedure Laterality Date   ABDOMINAL HYSTERECTOMY  02/21/2003   CHOLECYSTECTOMY N/A 05/10/2020   Procedure: LAPAROSCOPIC CHOLECYSTECTOMY WITH INTRAOPERATIVE CHOLANGIOGRAM AND LIVER BIOPSY;  Surgeon: Felicie Morn, MD;  Location: [facility];  Service: General;  Laterality: N/A;   DG SACROILIAC JOINTS ([place] HX)  01/11/2021   [facility]   ENDOSCOPIC TURBINATE REDUCTION Bilateral 10/13/2020   Procedure:  ENDOSCOPIC BILATERAL INFERIOR TURBINATE REDUCTIONS;  Surgeon: Rozetta Nunnery, MD;  Location: [place];  Service: ENT;  Laterality: Bilateral;   ETHMOIDECTOMY Bilateral 10/13/2020   Procedure: BILATERAL TOTAL ETHMOIDECTOMY AND MAXILLARY OSTIA ENLARGEMENTS;  Surgeon: Rozetta Nunnery, MD;  Location: [place];  Service: ENT;  Laterality: Bilateral;   LIVER BIOPSY     NASAL SINUS SURGERY Bilateral 10/13/2020   Procedure: SINUS ENDOSCOPY WITH STEALTH NAVIGATION;  Surgeon: Rozetta Nunnery, MD;  Location: [place];  Service: ENT;  Laterality: Bilateral;   TONSILLECTOMY     Allergies  Allergen Reactions   Clarithromycin Other (See Comments)    Body cramps    Codeine Nausea And Vomiting   Tylenol [Acetaminophen] Other (See Comments)    Can not take Tylenol products due to liver   Prior to Admission medications   Medication Sig Start Date End Date Taking? Authorizing Provider  albuterol (VENTOLIN HFA) 108 (90 Base) MCG/ACT inhaler Inhale 2 puffs into the lungs every 6 (six) hours as needed for wheezing or shortness of breath. 08/08/21  Yes Dewald, Cheryle Horsfall, MD  atorvastatin (LIPITOR) 10 MG tablet Take 1 tablet (10 mg total) by mouth daily. 10/18/20  Yes Wendie Agreste, MD  cyclobenzaprine (FLEXERIL) 10 MG tablet Take 10 mg by mouth as needed. 12/22/20  Yes [provider]  DULoxetine (CYMBALTA) 60 MG capsule TAKE 2 CAPSULE BY MOUTH ONCE DAILY 05/02/21  Yes Wendie Agreste, MD  FARXIGA 10 MG TABS tablet Take 10 mg by mouth daily. 12/04/20  Yes [provider]  Fluticasone-Umeclidin-Vilant (TRELEGY ELLIPTA) 100-62.5-25 MCG/ACT AEPB Inhale 1 puff into the lungs daily. 08/08/21  Yes Freddi Starr, MD  furosemide (LASIX) 20 MG tablet TAKE 1 TABLET BY MOUTH ONCE DAILY AS NEEDED 08/25/21  Yes Wendie Agreste, MD  gabapentin (NEURONTIN) 300 MG capsule Take 1 capsule (300 mg total) by mouth as directed. 1 capsule in  the morning and 2 pills at lunch and 3 pills at night 07/24/21  Yes Nyoka Lint, PA-C  glipiZIDE (GLUCOTROL) 5 MG tablet Take 1 tablet (5 mg total) by mouth daily before breakfast AND 2 tablets (10 mg total) daily before supper. 06/20/21  Yes Shamleffer, Melanie Crazier, MD  ibuprofen (ADVIL) 600 MG tablet Take 1 tablet (600 mg total) by mouth 2 (two) times daily as needed. 04/15/21  Yes Draper, Carlos Levering, DO  levothyroxine (SYNTHROID) 150 MCG tablet Take 1 tablet (150 mcg total) by mouth daily. 04/20/21  Yes Shamleffer, Melanie Crazier, MD  lidocaine (LIDODERM) 5 % USE 1 PATCH EXTERNALLY ONCE DAILY REMOVE  AND  DISCARD  PATCH  WITH  12  HOURS  OR  AS  DIRECTED  BY  MD 08/11/20  Yes Wendie Agreste, MD  metFORMIN (GLUCOPHAGE) 850 MG tablet TAKE 1 TABLET BY MOUTH TWICE DAILY WITH A MEAL 07/14/21  Yes Shamleffer, Melanie Crazier, MD  omeprazole (PRILOSEC) 40 MG  capsule Take 1 capsule (40 mg total) by mouth daily. 10/20/20  Yes Jackquline Denmark, MD  traMADol (ULTRAM) 50 MG tablet Take 2 tablets (100 mg total) by mouth every 6 (six) hours as needed. 09/07/21  Yes Nyoka Lint, PA-C  triamcinolone (NASACORT) 55 MCG/ACT AERO nasal inhaler Place 2 sprays into the nose daily. 05/02/21  Yes Wendie Agreste, MD  XIFAXAN 550 MG TABS tablet Take 1 tablet by mouth twice daily 11/08/20  Yes Jackquline Denmark, MD  levocetirizine (XYZAL) 5 MG tablet Take 1 tablet (5 mg total) by mouth every evening. Patient not taking: Reported on 09/13/2021 05/02/21   Wendie Agreste, MD  meloxicam (MOBIC) 15 MG tablet Take 15 mg by mouth daily. PRN Patient not taking: Reported on 09/15/2021 08/19/21   [provider]  NONFORMULARY OR COMPOUNDED ITEM Mix: Amitriptyline 2%, Lidocaine 2%, and Ketamine 1% - apply on feet 2x a day Patient not taking: Reported on 09/13/2021 08/22/21   Philemon Kingdom, MD  azelastine (ASTELIN) 0.1 % nasal spray Place 2 sprays into both nostrils 2 (two) times daily. 04/23/19 12/26/19  Ok Edwards, PA-C   Social  History   Socioeconomic History   Marital status: Single    Spouse name: Not on file   Number of children: Not on file   Years of education: Not on file   Highest education level: Not on file  Occupational History   Not on file  Tobacco Use   Smoking status: Former    Packs/day: 0.50    Types: Cigarettes    Start date: 01/16/1985    Quit date: 10/08/2019    Years since quitting: 1.9   Smokeless tobacco: Never   Tobacco comments:    7- 8 cig a week  Vaping Use   Vaping Use: Never used  Substance and Sexual Activity   Alcohol use: No   Drug use: No   Sexual activity: Not on file  Other Topics Concern   Not on file  Social History Narrative   Right Handed    Caffeine rare   Live with husband and mother in law in a two story hpme   Social Determinants of Health   Financial Resource Strain: Not on file  Food Insecurity: Not on file  Transportation Needs: Not on file  Physical Activity: Not on file  Stress: Not on file  Social Connections: Not on file  Intimate Partner Violence: Not on file    Review of Systems   Objective:   Vitals:   09/15/21 1630  BP: 124/82  Pulse: 98  Temp: 98.3 F (36.8 C)  SpO2: 97%  Weight: 253 lb 6.4 oz (114.9 kg)  Height: 5' 6"  (1.676 m)     Physical Exam Vitals reviewed.  Constitutional:      General: She is not in acute distress.    Appearance: She is well-developed.  HENT:     Head: Normocephalic and atraumatic.     Right Ear: Hearing normal.     Left Ear: Hearing normal.     Nose: Nose normal.     Comments: Dried nasal d/c in R nasl passage. Left maxillary sinus ttp    Mouth/Throat:     Pharynx: No posterior oropharyngeal erythema.  Eyes:     Conjunctiva/sclera: Conjunctivae normal.     Pupils: Pupils are equal, round, and reactive to light.  Cardiovascular:     Rate and Rhythm: Normal rate and regular rhythm.     Heart sounds: Normal heart sounds. No  murmur heard. Pulmonary:     Effort: Pulmonary effort is normal.  No respiratory distress.     Breath sounds: Normal breath sounds. No stridor. No wheezing or rhonchi.  Skin:    General: Skin is warm and dry.     Findings: No rash.  Neurological:     Mental Status: She is alert and oriented to person, place, and time.  Psychiatric:        Mood and Affect: Mood normal.        Behavior: Behavior normal.    Assessment & Plan:  Teresa Castaneda is a 51 y.o. female . Acquired hypothyroidism  -Uncontrolled on recent testing but medication nonadherence as above.  Recommended discussion of any medication changes or repeat testing interval with her endocrinologist.  Has follow-up as planned above.  Type 2 diabetes mellitus with hyperglycemia, without long-term current use of insulin (HCC)  -Uncontrolled on most recent A1c.  Glipizide dose was increased in the afternoon at her last visit with endocrinology note reviewed as above regarding concern for mid day hypoglycemia.  I did not make any changes and I recommended she discuss any adjustments in her meds with endocrinology.  Improved diabetes control stressed as below to help with neuropathic pain component.  Postnasal drip - Plan: ipratropium (ATROVENT) 0.06 % nasal spray Sinus congestion - Plan: ipratropium (ATROVENT) 0.06 % nasal spray, doxycycline (VIBRA-TABS) 100 MG tablet Sinus pain - Plan: ipratropium (ATROVENT) 0.06 % nasal spray, doxycycline (VIBRA-TABS) 100 MG tablet Acute cough  -Cough likely related to postnasal drip, chronic sinus issues.  Some sinus pain, headache, discolored nasal discharge, possible underlying sinusitis.  Has tolerated doxycycline previously, not Augmentin.  -Doxycycline 10-day course, potential side effects of antibiotics discussed.  -Trial of additional Atrovent nasal spray, continue oral antihistamine and steroid nasal spray with saline nasal spray as needed.  May need ENT follow-up if not improving.  RTC precautions if persistent cough or new/worsening symptoms.  Type 2  diabetes mellitus with diabetic neuropathy, without long-term current use of insulin (HCC)  -Various causes of foot pain per neuro above.  Does have some room for adjustments with both her Cymbalta and gabapentin to treat neuropathic symptoms and has pain management follow-up planned.  Did not tolerate 120 mg Cymbalta but I did recommend 90 mg as a middle ground that may be helpful on treating pain.  She declined that change today and decided to remain on the 60 mg dose.  We also discussed increasing her gabapentin dosage, and will initially take additional 300 mg at bedtime, then can look at additional doses during the day if persistent symptoms.  RTC precautions.   Meds ordered this encounter  Medications   ipratropium (ATROVENT) 0.06 % nasal spray    Sig: Place 1-2 sprays into both nostrils 4 (four) times daily. As needed for nasal congestion    Dispense:  15 mL    Refill:  5   doxycycline (VIBRA-TABS) 100 MG tablet    Sig: Take 1 tablet (100 mg total) by mouth 2 (two) times daily.    Dispense:  20 tablet    Refill:  0   Patient Instructions  Try atrovent nasal spray and doxycycline in addition to your other meds for sinus symptoms - should help cough.   Discuss diabetes or thyroid med changes with Dr. Kelton Pillar.   You can try 4 of the gapapentin at night for now, then can decide on further dose changes depending on improvement with this dose. Keep follow up  with pain management as planned.  As we discussed I would recommend trying a higher dose of Cymbalta at 90 mg total per day which would be a 60 mg and a 30 mg pill as that can also help with chronic pain.  Let me know if you would like to make this change.    Signed,   Merri Ray, MD [place] 09/15/21 6:31 PM

## 2021-09-15 NOTE — Patient Instructions (Addendum)
Try atrovent nasal spray and doxycycline in addition to your other meds for sinus symptoms - should help cough.   Discuss diabetes or thyroid med changes with Dr. Kelton Pillar.   You can try 4 of the gapapentin at night for now, then can decide on further dose changes depending on improvement with this dose. Keep follow up with pain management as planned.  As we discussed I would recommend trying a higher dose of Cymbalta at 90 mg total per day which would be a 60 mg and a 30 mg pill as that can also help with chronic pain.  Let me know if you would like to make this change.

## 2021-09-20 ENCOUNTER — Encounter: Payer: Self-pay | Admitting: Neurology

## 2021-09-20 LAB — PROTEIN ELECTROPHORESIS, SERUM
Albumin ELP: 3.6 g/dL — ABNORMAL LOW (ref 3.8–4.8)
Alpha 1: 0.3 g/dL (ref 0.2–0.3)
Alpha 2: 0.7 g/dL (ref 0.5–0.9)
Beta 2: 0.6 g/dL — ABNORMAL HIGH (ref 0.2–0.5)
Beta Globulin: 0.7 g/dL — ABNORMAL HIGH (ref 0.4–0.6)
Gamma Globulin: 1.7 g/dL (ref 0.8–1.7)
Total Protein: 7.6 g/dL (ref 6.1–8.1)

## 2021-09-20 LAB — IMMUNOFIXATION ELECTROPHORESIS
IgG (Immunoglobin G), Serum: 1721 mg/dL — ABNORMAL HIGH (ref 600–1640)
IgM, Serum: 89 mg/dL (ref 50–300)
Immunofix Electr Int: NOT DETECTED
Immunoglobulin A: 593 mg/dL — ABNORMAL HIGH (ref 47–310)

## 2021-09-21 NOTE — Telephone Encounter (Signed)
Error by clicking telephone encounter

## 2021-10-21 ENCOUNTER — Ambulatory Visit: Payer: BC Managed Care – PPO | Admitting: Internal Medicine

## 2021-10-21 ENCOUNTER — Encounter: Payer: Self-pay | Admitting: Internal Medicine

## 2021-10-21 VITALS — BP 130/84 | HR 95 | Ht 66.0 in | Wt 257.0 lb

## 2021-10-21 DIAGNOSIS — E039 Hypothyroidism, unspecified: Secondary | ICD-10-CM

## 2021-10-21 DIAGNOSIS — E1140 Type 2 diabetes mellitus with diabetic neuropathy, unspecified: Secondary | ICD-10-CM

## 2021-10-21 DIAGNOSIS — E1165 Type 2 diabetes mellitus with hyperglycemia: Secondary | ICD-10-CM | POA: Diagnosis not present

## 2021-10-21 LAB — POCT GLYCOSYLATED HEMOGLOBIN (HGB A1C): Hemoglobin A1C: 7.4 % — AB (ref 4.0–5.6)

## 2021-10-21 LAB — TSH: TSH: 1.8 u[IU]/mL (ref 0.35–5.50)

## 2021-10-21 LAB — T4, FREE: Free T4: 1.14 ng/dL (ref 0.60–1.60)

## 2021-10-21 MED ORDER — TIRZEPATIDE 2.5 MG/0.5ML ~~LOC~~ SOAJ: 2.5000 mg | SUBCUTANEOUS | 3 refills | Status: DC

## 2021-10-21 MED ORDER — LEVOTHYROXINE SODIUM 150 MCG PO TABS: 150.0000 ug | ORAL_TABLET | Freq: Every day | ORAL | 3 refills | Status: DC

## 2021-10-21 MED ORDER — GLIPIZIDE 5 MG PO TABS: 5.0000 mg | ORAL_TABLET | Freq: Two times a day (BID) | ORAL | 3 refills | Status: DC

## 2021-10-21 MED ORDER — METFORMIN HCL 850 MG PO TABS: 850.0000 mg | ORAL_TABLET | Freq: Two times a day (BID) | ORAL | 3 refills | Status: DC

## 2021-10-21 MED ORDER — FARXIGA 10 MG PO TABS: 10.0000 mg | ORAL_TABLET | Freq: Every day | ORAL | 3 refills | Status: DC

## 2021-10-21 NOTE — Patient Instructions (Addendum)
-   Continue Metformin 850 mg, 1 tablet with Breakfast and Supper  - Continue Farxiga 10 mg daily  - Continue Glipizide 5 mg,  1 tablet before breakfast and 1 tablet before supper  ( If sugars are less than 100 , on mounjaro , reduce it to one tablet ONLY before breakfast  - Start Mounjaro 2.5 mg weekly      HOW TO TREAT LOW BLOOD SUGARS (Blood sugar LESS THAN 70 MG/DL) Please follow the RULE OF 15 for the treatment of hypoglycemia treatment (when your (blood sugars are less than 70 mg/dL)   STEP 1: Take 15 grams of carbohydrates when your blood sugar is low, which includes:  3-4 GLUCOSE TABS  OR 3-4 OZ OF JUICE OR REGULAR SODA OR ONE TUBE OF GLUCOSE GEL    STEP 2: RECHECK blood sugar in 15 MINUTES STEP 3: If your blood sugar is still low at the 15 minute recheck --> then, go back to STEP 1 and treat AGAIN with another 15 grams of carbohydrates.

## 2021-10-21 NOTE — Progress Notes (Signed)
Name: Teresa Castaneda  Age/ Sex: 51 y.o., female   MRN/ DOB: [id number], July 07, 1970     PCP: Wendie Agreste, MD   Reason for Endocrinology Evaluation: Type 2 Diabetes Mellitus  Initial Endocrine Consultative Visit: 04/05/2020    PATIENT IDENTIFIER: [name] is a 51 y.o. female with a past medical history of T2DM, cirrhosis  and Hypothyroidism. The patient has followed with Endocrinology clinic since 04/05/2020 for consultative assistance with management of her diabetes.  DIABETIC HISTORY:  Teresa Castaneda was diagnosed with DM in 08/2019, Metformin started 08/2019. Her hemoglobin A1c has ranged from 8.1% in 2022, peaking at 9.8% in 2022. Has mild upset stomach since increasing metformin but its transient    Glipizide started 02/2021    THYROID HISTORY: She has been diagnosed with hypothyroidism ~ 5 yrs ago No prior sx or radiation to the neck She was on Levothyroxine in he past but that didn't  work   Pt is on Armour thyroid 180 mg daily and 15 mg daily , we stopped 50 mg of Armour Thyroid which normalized her TSH.  Switched from armour to levothyroxine 04/2021 due to cost   SUBJECTIVE:   During the last visit (02/18/2021): A1c 8.8 %      Today (10/21/2021): Teresa Castaneda  is here for a follow up on diabetes and hypothyroid management. She checks her blood sugars 3 x daily  . The patient has not had hypoglycemic episodes since the last clinic visit but endorses subjective hypoglycemia with Bg's 100s around 1 pm    She was seen by neurology 09/13/2021 for neuropathy  She is interested in GLP-a agonists , she has  cirrhosis  Denies recent nausea, vomitn or diarrhea  Denies pancreatitis     HOME ENDOCRINE  REGIMEN:  Metformin 850 mg BID  Farxiga 10 mg daily  Glipizide 5 mg, 1 tablet before breakfast and 2 tablets before supper- has been taking 1 tab BID  Levothyroxine 150 mg daily       Statin: Yes ACE-I/ARB: No Prior Diabetic Education: No   METER DOWNLOAD  SUMMARY: n/a   DIABETIC COMPLICATIONS: Microvascular complications:  Neuropathy Denies: CKD, retinopathy Last Eye Exam: Completed 03/2020  Macrovascular complications:   Denies: CAD, CVA, PVD   HISTORY:  Past Medical History:  Past Medical History:  Diagnosis Date   Allergies    Anxiety    Arthritis    Asthma    Back pain    Spinal Issue    Diabetes mellitus without complication (HCC)    DJD (degenerative joint disease), lumbar    Elevated cholesterol    Gallstone    GERD (gastroesophageal reflux disease)    Liver cirrhosis secondary to NASH ([place])    Neuromuscular disorder ([place])    Obesity    Oxygen deficiency    Thyroid disease    Past Surgical History:  Past Surgical History:  Procedure Laterality Date   ABDOMINAL HYSTERECTOMY  02/21/2003   CHOLECYSTECTOMY N/A 05/10/2020   Procedure: LAPAROSCOPIC CHOLECYSTECTOMY WITH INTRAOPERATIVE CHOLANGIOGRAM AND LIVER BIOPSY;  Surgeon: Felicie Morn, MD;  Location: [facility];  Service: General;  Laterality: N/A;   DG SACROILIAC JOINTS ([place] [facility])  01/11/2021   [facility]   ENDOSCOPIC TURBINATE REDUCTION Bilateral 10/13/2020   Procedure: ENDOSCOPIC BILATERAL INFERIOR TURBINATE REDUCTIONS;  Surgeon: Rozetta Nunnery, MD;  Location: [facility];  Service: ENT;  Laterality: Bilateral;   ETHMOIDECTOMY Bilateral 10/13/2020   Procedure: BILATERAL TOTAL ETHMOIDECTOMY AND MAXILLARY OSTIA ENLARGEMENTS;  Surgeon: Rozetta Nunnery, MD;  Location: [facility];  Service: ENT;  Laterality: Bilateral;   LIVER BIOPSY     NASAL SINUS SURGERY Bilateral 10/13/2020   Procedure: SINUS ENDOSCOPY WITH STEALTH NAVIGATION;  Surgeon: Rozetta Nunnery, MD;  Location: [facility];  Service: ENT;  Laterality: Bilateral;   TONSILLECTOMY     Social History:  reports that she quit smoking about 2 years ago. Her smoking use included cigarettes. She started smoking about 36 years ago. She  smoked an average of .5 packs per day. She has never used smokeless tobacco. She reports that she does not drink alcohol and does not use drugs. Family History:  Family History  Problem Relation Age of Onset   Coronary artery disease Mother    Diabetes Mother    COPD Mother    Coronary artery disease Father    Hypertension Father    Colon cancer Neg Hx    Esophageal cancer Neg Hx      HOME MEDICATIONS: Allergies as of 10/21/2021       Reactions   Clarithromycin Other (See Comments)   Body cramps    Codeine Nausea And Vomiting   Tylenol [acetaminophen] Other (See Comments)   Can not take Tylenol products due to liver        Medication List        Accurate as of October 21, 2021  8:24 AM. If you have any questions, ask your nurse or doctor.          STOP taking these medications    doxycycline 100 MG tablet Commonly known as: VIBRA-TABS Stopped by: Dorita Sciara, MD       TAKE these medications    albuterol 108 (90 Base) MCG/ACT inhaler Commonly known as: VENTOLIN HFA Inhale 2 puffs into the lungs every 6 (six) hours as needed for wheezing or shortness of breath.   atorvastatin 10 MG tablet Commonly known as: LIPITOR Take 1 tablet (10 mg total) by mouth daily.   cyclobenzaprine 10 MG tablet Commonly known as: FLEXERIL Take 10 mg by mouth as needed.   DULoxetine 60 MG capsule Commonly known as: CYMBALTA Take 1 capsule (60 mg total) by mouth daily. TAKE 2 CAPSULE BY MOUTH ONCE DAILY   Farxiga 10 MG Tabs tablet Generic drug: dapagliflozin propanediol Take 10 mg by mouth daily.   furosemide 20 MG tablet Commonly known as: LASIX TAKE 1 TABLET BY MOUTH ONCE DAILY AS NEEDED   gabapentin 300 MG capsule Commonly known as: NEURONTIN Take 1 capsule (300 mg total) by mouth as directed. 1 capsule in the morning and 2 pills at lunch and 3 pills at night   glipiZIDE 5 MG tablet Commonly known as: GLUCOTROL Take 1 tablet (5 mg total) by mouth daily  before breakfast AND 2 tablets (10 mg total) daily before supper.   ibuprofen 600 MG tablet Commonly known as: ADVIL Take 1 tablet (600 mg total) by mouth 2 (two) times daily as needed.   ipratropium 0.06 % nasal spray Commonly known as: ATROVENT Place 1-2 sprays into both nostrils 4 (four) times daily. As needed for nasal congestion   levocetirizine 5 MG tablet Commonly known as: XYZAL Take 1 tablet (5 mg total) by mouth every evening.   levothyroxine 150 MCG tablet Commonly known as: SYNTHROID Take 1 tablet (150 mcg total) by mouth daily.   lidocaine 5 % Commonly known as: LIDODERM USE 1 PATCH EXTERNALLY ONCE DAILY REMOVE  AND  DISCARD  PATCH  WITH  12  HOURS  OR  AS  DIRECTED  BY  MD   meloxicam 15 MG tablet Commonly known as: MOBIC Take 15 mg by mouth daily. PRN   metFORMIN 850 MG tablet Commonly known as: GLUCOPHAGE TAKE 1 TABLET BY MOUTH TWICE DAILY WITH A MEAL   NONFORMULARY OR COMPOUNDED ITEM Mix: Amitriptyline 2%, Lidocaine 2%, and Ketamine 1% - apply on feet 2x a day   omeprazole 40 MG capsule Commonly known as: PRILOSEC Take 1 capsule (40 mg total) by mouth daily.   traMADol 50 MG tablet Commonly known as: ULTRAM Take 2 tablets (100 mg total) by mouth every 6 (six) hours as needed.   Trelegy Ellipta 100-62.5-25 MCG/ACT Aepb Generic drug: Fluticasone-Umeclidin-Vilant Inhale 1 puff into the lungs daily.   triamcinolone 55 MCG/ACT Aero nasal inhaler Commonly known as: NASACORT Place 2 sprays into the nose daily.   Xifaxan 550 MG Tabs tablet Generic drug: rifaximin Take 1 tablet by mouth twice daily         OBJECTIVE:   Vital Signs: BP 130/84 (BP Location: Left Arm, Patient Position: Sitting, Cuff Size: Large)   Pulse 95   Ht 5' 6"  (1.676 m)   Wt 257 lb (116.6 kg)   SpO2 96%   BMI 41.48 kg/m   Wt Readings from Last 3 Encounters:  10/21/21 257 lb (116.6 kg)  09/15/21 253 lb 6.4 oz (114.9 kg)  09/13/21 254 lb (115.2 kg)      Exam: General: Pt appears well and is in NAD  Neck: General: Supple without adenopathy. Thyroid:  No goiter or nodules appreciated.   Lungs: Clear with good BS bilat with no rales, rhonchi, or wheezes  Heart: RRR   Extremities: Trace edema   Neuro: MS is good with appropriate affect, pt is alert and Ox3   DM Foot Exam 02/18/2021 The skin of the feet is intact without sores or ulcerations. The pedal pulses are 2+ on right and 2+ on left. The sensation is absent to a screening 5.07, 10 gram monofilament bilaterally   DATA REVIEWED:   Lab Results  Component Value Date   HGBA1C 7.4 (A) 10/21/2021   HGBA1C 9.1 (H) 08/30/2021   HGBA1C 8.8 (A) 06/20/2021     Latest Reference Range & Units 08/30/21 10:06  Sodium 135 - 145 mEq/L 138  Potassium 3.5 - 5.1 mEq/L 3.9  Chloride 96 - 112 mEq/L 103  CO2 19 - 32 mEq/L 27  Glucose 70 - 99 mg/dL 142 (H)  BUN 6 - 23 mg/dL 6  Creatinine 0.40 - 1.20 mg/dL 0.49  Calcium 8.4 - 10.5 mg/dL 9.0  Alkaline Phosphatase 39 - 117 U/L 81  Albumin 3.5 - 5.2 g/dL 3.5  AST 0 - 37 U/L 39 (H)  ALT 0 - 35 U/L 29  Total Protein 6.0 - 8.3 g/dL 7.3  Total Bilirubin 0.2 - 1.2 mg/dL 0.7  GFR >60.00 mL/min 110.41    Latest Reference Range & Units 10/21/21 08:43  TSH 0.35 - 5.50 uIU/mL 1.80  T4,Free(Direct) 0.60 - 1.60 ng/dL 1.14    ASSESSMENT / PLAN / RECOMMENDATIONS:   1) Type 2 Diabetes Mellitus, with improving glycemic control, With out complications - Most recent A1c of 7.4 %. Goal A1c < 7.0 %.    -Her A1c has trended down from 9.1% to 7.4% -I have praised the patient improved glycemic control -Intolerant to higher doses of Metformin  -She has been using less glipizide than previously advised, we will continue  current dose, but she was advised to reduce to once daily should her BG's remain less than 100 mg/DL -She is interested in GLP-1 agonist, she discussed this with her rheumatologist due to her liver cirrhosis, prep and he is okay with  this -Discussed starting Mounjaro, cautioned against GI side effects, she has no history of pancreatitis, if this is not covered we will proceed with Ozempic.  She was provided with a coupon   MEDICATIONS: Continue Metformin 850 mg, 1 tablet with Breakfast and Supper  Continue Farxiga 10  mg daily  Change Glipizide 5 mg , 1 tablet before Breakfast and 1 tab before Supper  Start Mounjaro 2.5 mg weekly   EDUCATION / INSTRUCTIONS: BG monitoring instructions: Patient is instructed to check her blood sugars 1 times a day, fasting. Call [facility] if: BG persistently < 70  I reviewed the Rule of 15 for the treatment of hypoglycemia in detail with the patient. Literature supplied.    2) Diabetic complications:  Eye: Does not have known diabetic retinopathy.  Neuro/ Feet: Does not have known diabetic peripheral neuropathy .  Renal: Patient does not have known baseline CKD. She   is not on an ACEI/ARB at present.    3) Hypothyroidism:   -Her TSH was elevated in August at 35 u IU/mL, but she admits to imperfect adherence to levothyroxine at the time -TFT's are normal  Medication  Continue levothyroxine 150 mg daily   F/U in 6 months   Signed electronically by: Mack Guise, MD  [facility] Endocrinology  [facility] [address] [place], [place] [address] Phone: [phone] FAX: [phone]   CC: Wendie Agreste, MD [address] [place] [address] Phone: [phone]  Fax: [phone]  Return to Endocrinology clinic as below: Future Appointments  Date Time Provider [place]  11/04/2021  8:00 AM Wendie Agreste, MD [id number]  11/11/2021 10:20 AM Raulkar, Clide Deutscher, MD [facility]

## 2021-11-04 ENCOUNTER — Ambulatory Visit (INDEPENDENT_AMBULATORY_CARE_PROVIDER_SITE_OTHER): Payer: BC Managed Care – PPO | Admitting: Family Medicine

## 2021-11-04 ENCOUNTER — Encounter: Payer: Self-pay | Admitting: Family Medicine

## 2021-11-04 VITALS — BP 118/60 | HR 104 | Temp 98.0°F | Ht 66.0 in | Wt 249.0 lb

## 2021-11-04 DIAGNOSIS — Z720 Tobacco use: Secondary | ICD-10-CM

## 2021-11-04 DIAGNOSIS — S00411A Abrasion of right ear, initial encounter: Secondary | ICD-10-CM | POA: Diagnosis not present

## 2021-11-04 DIAGNOSIS — J309 Allergic rhinitis, unspecified: Secondary | ICD-10-CM | POA: Diagnosis not present

## 2021-11-04 DIAGNOSIS — R600 Localized edema: Secondary | ICD-10-CM

## 2021-11-04 DIAGNOSIS — K219 Gastro-esophageal reflux disease without esophagitis: Secondary | ICD-10-CM

## 2021-11-04 DIAGNOSIS — F411 Generalized anxiety disorder: Secondary | ICD-10-CM

## 2021-11-04 DIAGNOSIS — Z0000 Encounter for general adult medical examination without abnormal findings: Secondary | ICD-10-CM

## 2021-11-04 DIAGNOSIS — Z1231 Encounter for screening mammogram for malignant neoplasm of breast: Secondary | ICD-10-CM

## 2021-11-04 MED ORDER — OMEPRAZOLE 40 MG PO CPDR: 40.0000 mg | DELAYED_RELEASE_CAPSULE | Freq: Every day | ORAL | 3 refills | Status: DC

## 2021-11-04 MED ORDER — TRIAMCINOLONE ACETONIDE 55 MCG/ACT NA AERO: 2.0000 | INHALATION_SPRAY | Freq: Every day | NASAL | 12 refills | Status: AC

## 2021-11-04 MED ORDER — MUPIROCIN 2 % EX OINT: 1.0000 | TOPICAL_OINTMENT | Freq: Two times a day (BID) | CUTANEOUS | 0 refills | Status: DC

## 2021-11-04 MED ORDER — FUROSEMIDE 20 MG PO TABS: 20.0000 mg | ORAL_TABLET | Freq: Every day | ORAL | 1 refills | Status: DC

## 2021-11-04 NOTE — Progress Notes (Unsigned)
Subjective:  Patient ID: Teresa Castaneda, female    DOB: 1971/01/08  Age: 51 y.o. MRN: [id number]  CC:  Chief Complaint  Patient presents with   Annual Exam    Pt states all is well Pt is fasting   Depression    PHQ9 - 12    HPI [place]ts [place] presents for Annual Exam Care team PCP, me Endocrinology, Dr. Kelton Pillar, diabetes, hypothyroidism.  Last visit October 6.  Continued metformin, Farxiga, changed her glipizide to once daily and added Mounjaro - doing well.  Gastroenterology, Dr. Lyndel Safe, Karlene Lineman liver cirrhosis, GERD.  Appointment August.  Continued on omeprazole, rifaximin, Lasix 75m qd (helping with swelling) and weight loss goals.  As above Mounjaro recently started. Blood sugar 129 this morning.  Pulmonary, Dr. DErin Fulling appointment in July.  Asthma.  Trelegy Ellipta.  Albuterol as needed.  History of nocturnal hypoxemia.  Nasacort, Xyzal for allergic rhinitis, I treated sinusitis with doxycycline in August.  Atrovent nasal spray as option. Cardiology, Dr. VIrish Lack last appointment November 2022.  Family history of early CAD with planned risk factor modification.  Anxiety/depression/insomnia With adjustment component with health issues and chronic pain.  Treated with Cymbalta 120 mg daily.  Minimal relief with Lexapro high dose in the past.  Hydroxyzine for sleep or anxiety flares, infrequent use.  Counseling recommended previously. Did not meet with therapist.  Some good days, some bad days. Hydroxyzine 2 times per month.  Declines psychiatry referral at this time.       11/04/2021    8:06 AM 05/02/2021    8:47 AM 03/02/2021   11:16 AM 01/27/2021   11:39 AM 12/02/2020   10:48 AM  Depression screen PHQ 2/9  Decreased Interest 1 2 1 2 1   Down, Depressed, Hopeless 2 1 1 2 1   PHQ - 2 Score 3 3 2 4 2   Altered sleeping 3 2 2 2 2   Tired, decreased energy 3 2 2 2 1   Change in appetite 1 1 0 1 1  Feeling bad or failure about yourself  0 1 0 2 0  Trouble concentrating 2 1 1 1  1   Moving slowly or fidgety/restless 0 2 0 1 0  Suicidal thoughts 0 0 0 0 0  PHQ-9 Score 12 12 7 13 7   Difficult doing work/chores    Not difficult at all     Health Maintenance  Topic Date Due   Diabetic kidney evaluation - Urine ACR  02/11/2021   OPHTHALMOLOGY EXAM  11/04/2021 (Originally 03/24/2021)   COVID-19 Vaccine (3 - Booster for Janssen series) 11/20/2021 (Originally 12/17/2020)   FOOT EXAM  01/14/2022 (Originally 06/09/2021)   HIV Screening  05/03/2022 (Originally 11/30/1986)   HEMOGLOBIN A1C  04/22/2022   Diabetic kidney evaluation - GFR measurement  08/31/2022   COLONOSCOPY (Pts 45-435yrInsurance coverage will need to be confirmed)  07/28/2027   TETANUS/TDAP  02/11/2030   Hepatitis C Screening  Completed   HPV VACCINES  Aged Out   INFLUENZA VACCINE  Discontinued  Colon ca screening:07/27/20, Dr. GuLyndel Saferepeat 7 years.  Mammogram: overdue. Agrees to screening. No FH of breast CA.  S/p hysterectomy for benign reasons. No new genital lesions, rash.   Immunization History  Administered Date(s) Administered   Hep A / Hep B 06/09/2020, 12/02/2020   Hepb-cpg 07/09/2020   Janssen (J&J) SARS-COV-2 Vaccination 04/26/2019   PFIZER Comirnaty([place])Covid-19 Tri-Sucrose Vaccine 10/22/2020   Pneumococcal Polysaccharide-23 06/09/2020   Td 02/12/2020  Flu vaccine declined.  Plans on scheduling  new covid booster at pharmacy.  Declines HIV screening.   Plans to schedule optho appt.   No results found.  Dental:none - plans to schedule appt - no dental insurance currently. Upper dentures.   Alcohol: none  Tobacco: 1-2 cigs per day.   Exercise: some walking.   Additional concern Sore in ear 2 weeks ago - popped with q tip. Scabbed, but stays irritated. Tx neosporin, peroxide.    History Patient Active Problem List   Diagnosis Date Noted   Cirrhosis of liver ([place]) 06/23/2020   Symptomatic cholelithiasis 05/10/2020   Acquired hypothyroidism 04/05/2020   Weight gain  04/05/2020   Type 2 diabetes mellitus with hyperglycemia, without long-term current use of insulin ([place]) 04/05/2020   History of COVID-19 09/23/2019   Pneumonia due to COVID-19 virus 09/23/2019   Cough 09/23/2019   BMI 40.0-44.9, adult ([place]) 07/22/2019   Facet arthropathy, lumbar 07/22/2019   Sacroiliitis ([place]) 07/22/2019   ANXIETY 09/10/2006   DEPRESSION 09/10/2006   ASTHMA 09/10/2006   HEADACHE 09/10/2006   Past Medical History:  Diagnosis Date   Allergies    Anxiety    Arthritis    Asthma    Back pain    Spinal Issue    Diabetes mellitus without complication (HCC)    DJD (degenerative joint disease), lumbar    Elevated cholesterol    Gallstone    GERD (gastroesophageal reflux disease)    Liver cirrhosis secondary to NASH ([place])    Neuromuscular disorder ([place])    Obesity    Oxygen deficiency    Thyroid disease    Past Surgical History:  Procedure Laterality Date   ABDOMINAL HYSTERECTOMY  02/21/2003   CHOLECYSTECTOMY N/A 05/10/2020   Procedure: LAPAROSCOPIC CHOLECYSTECTOMY WITH INTRAOPERATIVE CHOLANGIOGRAM AND LIVER BIOPSY;  Surgeon: Felicie Morn, MD;  Location: [facility];  Service: General;  Laterality: N/A;   DG SACROILIAC JOINTS ([place] [facility])  01/11/2021   [facility]   ENDOSCOPIC TURBINATE REDUCTION Bilateral 10/13/2020   Procedure: ENDOSCOPIC BILATERAL INFERIOR TURBINATE REDUCTIONS;  Surgeon: Rozetta Nunnery, MD;  Location: [facility];  Service: ENT;  Laterality: Bilateral;   ETHMOIDECTOMY Bilateral 10/13/2020   Procedure: BILATERAL TOTAL ETHMOIDECTOMY AND MAXILLARY OSTIA ENLARGEMENTS;  Surgeon: Rozetta Nunnery, MD;  Location: [facility];  Service: ENT;  Laterality: Bilateral;   LIVER BIOPSY     NASAL SINUS SURGERY Bilateral 10/13/2020   Procedure: SINUS ENDOSCOPY WITH STEALTH NAVIGATION;  Surgeon: Rozetta Nunnery, MD;  Location: [facility];  Service: ENT;  Laterality: Bilateral;    TONSILLECTOMY     Allergies  Allergen Reactions   Clarithromycin Other (See Comments)    Body cramps    Codeine Nausea And Vomiting   Tylenol [Acetaminophen] Other (See Comments)    Can not take Tylenol products due to liver   Prior to Admission medications   Medication Sig Start Date End Date Taking? Authorizing Provider  albuterol (VENTOLIN HFA) 108 (90 Base) MCG/ACT inhaler Inhale 2 puffs into the lungs every 6 (six) hours as needed for wheezing or shortness of breath. 08/08/21  Yes Dewald, Cheryle Horsfall, MD  atorvastatin (LIPITOR) 10 MG tablet Take 1 tablet (10 mg total) by mouth daily. 10/18/20  Yes Wendie Agreste, MD  cyclobenzaprine (FLEXERIL) 10 MG tablet Take 10 mg by mouth as needed. 12/22/20  Yes [provider]  DULoxetine (CYMBALTA) 60 MG capsule Take 1 capsule (60 mg total) by mouth daily. TAKE 2 CAPSULE BY MOUTH ONCE DAILY 09/15/21  Yes Wendie Agreste, MD  FARXIGA 10 MG TABS tablet Take 1 tablet (10 mg total) by mouth daily. 10/21/21  Yes Shamleffer, Melanie Crazier, MD  Fluticasone-Umeclidin-Vilant (TRELEGY ELLIPTA) 100-62.5-25 MCG/ACT AEPB Inhale 1 puff into the lungs daily. 08/08/21  Yes Freddi Starr, MD  furosemide (LASIX) 20 MG tablet TAKE 1 TABLET BY MOUTH ONCE DAILY AS NEEDED 08/25/21  Yes Wendie Agreste, MD  gabapentin (NEURONTIN) 300 MG capsule Take 1 capsule (300 mg total) by mouth as directed. 1 capsule in the morning and 2 pills at lunch and 3 pills at night 07/24/21  Yes Nyoka Lint, PA-C  glipiZIDE (GLUCOTROL) 5 MG tablet Take 1 tablet (5 mg total) by mouth 2 (two) times daily before a meal. 10/21/21  Yes Shamleffer, Melanie Crazier, MD  ibuprofen (ADVIL) 600 MG tablet Take 1 tablet (600 mg total) by mouth 2 (two) times daily as needed. 04/15/21  Yes Draper, Christia Reading R, DO  ipratropium (ATROVENT) 0.06 % nasal spray Place 1-2 sprays into both nostrils 4 (four) times daily. As needed for nasal congestion 09/15/21  Yes Wendie Agreste, MD  levocetirizine  (XYZAL) 5 MG tablet Take 1 tablet (5 mg total) by mouth every evening. 05/02/21  Yes Wendie Agreste, MD  levothyroxine (SYNTHROID) 150 MCG tablet Take 1 tablet (150 mcg total) by mouth daily. 10/21/21  Yes Shamleffer, Melanie Crazier, MD  lidocaine (LIDODERM) 5 % USE 1 PATCH EXTERNALLY ONCE DAILY REMOVE  AND  DISCARD  PATCH  WITH  12  HOURS  OR  AS  DIRECTED  BY  MD 08/11/20  Yes Wendie Agreste, MD  meloxicam (MOBIC) 15 MG tablet Take 15 mg by mouth daily. PRN 08/19/21  Yes [provider]  metFORMIN (GLUCOPHAGE) 850 MG tablet Take 1 tablet (850 mg total) by mouth 2 (two) times daily with a meal. 10/21/21  Yes Shamleffer, Melanie Crazier, MD  omeprazole (PRILOSEC) 40 MG capsule Take 1 capsule (40 mg total) by mouth daily. 10/20/20  Yes Jackquline Denmark, MD  tirzepatide [facility]) 2.5 MG/0.5ML Pen Inject 2.5 mg into the skin once a week. 10/21/21  Yes Shamleffer, Melanie Crazier, MD  traMADol (ULTRAM) 50 MG tablet Take 2 tablets (100 mg total) by mouth every 6 (six) hours as needed. 09/07/21  Yes Nyoka Lint, PA-C  triamcinolone (NASACORT) 55 MCG/ACT AERO nasal inhaler Place 2 sprays into the nose daily. 05/02/21  Yes Wendie Agreste, MD  XIFAXAN 550 MG TABS tablet Take 1 tablet by mouth twice daily 11/08/20  Yes Jackquline Denmark, MD  azelastine (ASTELIN) 0.1 % nasal spray Place 2 sprays into both nostrils 2 (two) times daily. 04/23/19 12/26/19  Ok Edwards, PA-C   Social History   Socioeconomic History   Marital status: Single    Spouse name: Not on file   Number of children: Not on file   Years of education: Not on file   Highest education level: Not on file  Occupational History   Not on file  Tobacco Use   Smoking status: Some Days    Packs/day: 0.50    Types: Cigarettes    Start date: 01/16/1985    Last attempt to quit: 10/08/2019    Years since quitting: 2.0   Smokeless tobacco: Never   Tobacco comments:    7- 8 cig a week  Vaping Use   Vaping Use: Never used  Substance and Sexual  Activity   Alcohol use: No   Drug use: No   Sexual activity: Not on  file  Other Topics Concern   Not on file  Social History Narrative   Right Handed    Caffeine rare   Live with husband and mother in law in a two story hpme   Social Determinants of Health   Financial Resource Strain: Not on file  Food Insecurity: Not on file  Transportation Needs: Not on file  Physical Activity: Not on file  Stress: Not on file  Social Connections: Not on file  Intimate Partner Violence: Not on file    Review of Systems 13 point review of systems per patient health survey noted.  Negative other than as indicated above or in HPI.    Objective:   Vitals:   11/04/21 0807  BP: 118/60  Pulse: (!) 104  Temp: 98 F (36.7 C)  SpO2: 98%  Weight: 249 lb (112.9 kg)  Height: 5' 6"  (1.676 m)     Physical Exam Constitutional:      Appearance: She is well-developed.  HENT:     Head: Normocephalic and atraumatic.     Right Ear: External ear normal.     Left Ear: External ear normal.     Ears:     Comments: Abrasion of external ear on right, see photo.  No significant surrounding erythema or swelling appreciated. Eyes:     Conjunctiva/sclera: Conjunctivae normal.     Pupils: Pupils are equal, round, and reactive to light.  Neck:     Thyroid: No thyromegaly.  Cardiovascular:     Rate and Rhythm: Normal rate and regular rhythm.     Heart sounds: Normal heart sounds. No murmur heard. Pulmonary:     Effort: Pulmonary effort is normal. No respiratory distress.     Breath sounds: Normal breath sounds. No wheezing.  Abdominal:     General: Bowel sounds are normal.     Palpations: Abdomen is soft.     Tenderness: There is no abdominal tenderness.  Musculoskeletal:        General: No tenderness. Normal range of motion.     Cervical back: Normal range of motion and neck supple.  Lymphadenopathy:     Cervical: No cervical adenopathy.  Skin:    General: Skin is warm and dry.     Findings:  No rash.  Neurological:     Mental Status: She is alert and oriented to person, place, and time.  Psychiatric:        Behavior: Behavior normal.        Thought Content: Thought content normal.         Assessment & Plan:  ALEAYAH CHICO is a 51 y.o. female . Annual physical exam  - -anticipatory guidance as below in AVS, screening labs above. Health maintenance items as above in HPI discussed/recommended as applicable.   Pedal edema - Plan: furosemide (LASIX) 20 MG tablet  -Overall stable.  Continue furosemide.  Allergic rhinitis, unspecified seasonality, unspecified trigger - Plan: triamcinolone (NASACORT) 55 MCG/ACT AERO nasal inhaler  -Stable with current treatment, continue triamcinolone nasal inhaler.  Tobacco use  -Minimal as above, complete cessation recommended.  Abrasion of right ear, initial encounter - Plan: mupirocin ointment (BACTROBAN) 2 %  -Does not appear to be significantly infected at this time.  Mupirocin ointment over area, avoid any objects including fingernails to ear with RTC precautions discussed.  Anxiety state Intermittent flares as above, would like to remain at same regimen, declined psychiatry eval at this time.  Continue same meds with option of psychiatry follow-up if medication  adjustments needed.  Option of counseling, declined.  Gastroesophageal reflux disease, unspecified whether esophagitis present - Plan: omeprazole (PRILOSEC) 40 MG capsule  -Stable, continue omeprazole.  Encounter for screening mammogram for malignant neoplasm of breast - Plan: MM Digital Screening   Meds ordered this encounter  Medications   furosemide (LASIX) 20 MG tablet    Sig: Take 1 tablet (20 mg total) by mouth daily.    Dispense:  90 tablet    Refill:  1   triamcinolone (NASACORT) 55 MCG/ACT AERO nasal inhaler    Sig: Place 2 sprays into the nose daily.    Dispense:  1 each    Refill:  12   omeprazole (PRILOSEC) 40 MG capsule    Sig: Take 1 capsule (40 mg  total) by mouth daily.    Dispense:  90 capsule    Refill:  3   mupirocin ointment (BACTROBAN) 2 %    Sig: Apply 1 Application topically 2 (two) times daily. For 1 week    Dispense:  22 g    Refill:  0   Patient Instructions  I will order mammogram.  Please call and schedule eye specialist visit., and covid booster when available.  Dental school may be a lower cost dental option until insurance available.  Please let me know if we can help with quitting smoking.  I recommend counseling, and let me know if you would like to meet with psychiatrist.  Ointment to ear abrasion 2 times per day for 1 week. Return to the clinic or go to the nearest emergency room if any of your symptoms worsen or new symptoms occur.  Preventive Care 23-69 Years Old, Female Preventive care refers to lifestyle choices and visits with your health care provider that can promote health and wellness. Preventive care visits are also called wellness exams. What can I expect for my preventive care visit? Counseling Your health care provider may ask you questions about your: Medical history, including: Past medical problems. Family medical history. Pregnancy history. Current health, including: Menstrual cycle. Method of birth control. Emotional well-being. Home life and relationship well-being. Sexual activity and sexual health. Lifestyle, including: Alcohol, nicotine or tobacco, and drug use. Access to firearms. Diet, exercise, and sleep habits. Work and work Statistician. Sunscreen use. Safety issues such as seatbelt and bike helmet use. Physical exam Your health care provider will check your: Height and weight. These may be used to calculate your BMI (body mass index). BMI is a measurement that tells if you are at a healthy weight. Waist circumference. This measures the distance around your waistline. This measurement also tells if you are at a healthy weight and may help predict your risk of certain  diseases, such as type 2 diabetes and high blood pressure. Heart rate and blood pressure. Body temperature. Skin for abnormal spots. What immunizations do I need?  Vaccines are usually given at various ages, according to a schedule. Your health care provider will recommend vaccines for you based on your age, medical history, and lifestyle or other factors, such as travel or where you work. What tests do I need? Screening Your health care provider may recommend screening tests for certain conditions. This may include: Lipid and cholesterol levels. Diabetes screening. This is done by checking your blood sugar (glucose) after you have not eaten for a while (fasting). Pelvic exam and Pap test. Hepatitis B test. Hepatitis C test. HIV (human immunodeficiency virus) test. STI (sexually transmitted infection) testing, if you are at risk. Lung cancer  screening. Colorectal cancer screening. Mammogram. Talk with your health care provider about when you should start having regular mammograms. This may depend on whether you have a family history of breast cancer. BRCA-related cancer screening. This may be done if you have a family history of breast, ovarian, tubal, or peritoneal cancers. Bone density scan. This is done to screen for osteoporosis. Talk with your health care provider about your test results, treatment options, and if necessary, the need for more tests. Follow these instructions at home: Eating and drinking  Eat a diet that includes fresh fruits and vegetables, whole grains, lean protein, and low-fat dairy products. Take vitamin and mineral supplements as recommended by your health care provider. Do not drink alcohol if: Your health care provider tells you not to drink. You are pregnant, may be pregnant, or are planning to become pregnant. If you drink alcohol: Limit how much you have to 0-1 drink a day. Know how much alcohol is in your drink. In the [place] one drink equals one 12 oz  bottle of beer (355 mL), one 5 oz glass of wine (148 mL), or one 1 oz glass of hard liquor (44 mL). Lifestyle Brush your teeth every morning and night with fluoride toothpaste. Floss one time each day. Exercise for at least 30 minutes 5 or more days each week. Do not use any products that contain nicotine or tobacco. These products include cigarettes, chewing tobacco, and vaping devices, such as e-cigarettes. If you need help quitting, ask your health care provider. Do not use drugs. If you are sexually active, practice safe sex. Use a condom or other form of protection to prevent STIs. If you do not wish to become pregnant, use a form of birth control. If you plan to become pregnant, see your health care provider for a prepregnancy visit. Take aspirin only as told by your health care provider. Make sure that you understand how much to take and what form to take. Work with your health care provider to find out whether it is safe and beneficial for you to take aspirin daily. Find healthy ways to manage stress, such as: Meditation, yoga, or listening to music. Journaling. Talking to a trusted person. Spending time with friends and family. Minimize exposure to UV radiation to reduce your risk of skin cancer. Safety Always wear your seat belt while driving or riding in a vehicle. Do not drive: If you have been drinking alcohol. Do not ride with someone who has been drinking. When you are tired or distracted. While texting. If you have been using any mind-altering substances or drugs. Wear a helmet and other protective equipment during sports activities. If you have firearms in your house, make sure you follow all gun safety procedures. Seek help if you have been physically or sexually abused. What's next? Visit your health care provider once a year for an annual wellness visit. Ask your health care provider how often you should have your eyes and teeth checked. Stay up to date on all  vaccines. This information is not intended to replace advice given to you by your health care provider. Make sure you discuss any questions you have with your health care provider. Document Revised: 06/30/2020 Document Reviewed: 06/30/2020 Elsevier Patient Education  [place],   Merri Ray, MD [place], [facility] 11/04/21 8:49 AM

## 2021-11-04 NOTE — Patient Instructions (Addendum)
I will order mammogram.  Please call and schedule eye specialist visit., and covid booster when available.  Dental school may be a lower cost dental option until insurance available.  Please let me know if we can help with quitting smoking.  I recommend counseling, and let me know if you would like to meet with psychiatrist.  Ointment to ear abrasion 2 times per day for 1 week. Return to the clinic or go to the nearest emergency room if any of your symptoms worsen or new symptoms occur.  Preventive Care 24-32 Years Old, Female Preventive care refers to lifestyle choices and visits with your health care provider that can promote health and wellness. Preventive care visits are also called wellness exams. What can I expect for my preventive care visit? Counseling Your health care provider may ask you questions about your: Medical history, including: Past medical problems. Family medical history. Pregnancy history. Current health, including: Menstrual cycle. Method of birth control. Emotional well-being. Home life and relationship well-being. Sexual activity and sexual health. Lifestyle, including: Alcohol, nicotine or tobacco, and drug use. Access to firearms. Diet, exercise, and sleep habits. Work and work Statistician. Sunscreen use. Safety issues such as seatbelt and bike helmet use. Physical exam Your health care provider will check your: Height and weight. These may be used to calculate your BMI (body mass index). BMI is a measurement that tells if you are at a healthy weight. Waist circumference. This measures the distance around your waistline. This measurement also tells if you are at a healthy weight and may help predict your risk of certain diseases, such as type 2 diabetes and high blood pressure. Heart rate and blood pressure. Body temperature. Skin for abnormal spots. What immunizations do I need?  Vaccines are usually given at various ages, according to a schedule.  Your health care provider will recommend vaccines for you based on your age, medical history, and lifestyle or other factors, such as travel or where you work. What tests do I need? Screening Your health care provider may recommend screening tests for certain conditions. This may include: Lipid and cholesterol levels. Diabetes screening. This is done by checking your blood sugar (glucose) after you have not eaten for a while (fasting). Pelvic exam and Pap test. Hepatitis B test. Hepatitis C test. HIV (human immunodeficiency virus) test. STI (sexually transmitted infection) testing, if you are at risk. Lung cancer screening. Colorectal cancer screening. Mammogram. Talk with your health care provider about when you should start having regular mammograms. This may depend on whether you have a family history of breast cancer. BRCA-related cancer screening. This may be done if you have a family history of breast, ovarian, tubal, or peritoneal cancers. Bone density scan. This is done to screen for osteoporosis. Talk with your health care provider about your test results, treatment options, and if necessary, the need for more tests. Follow these instructions at home: Eating and drinking  Eat a diet that includes fresh fruits and vegetables, whole grains, lean protein, and low-fat dairy products. Take vitamin and mineral supplements as recommended by your health care provider. Do not drink alcohol if: Your health care provider tells you not to drink. You are pregnant, may be pregnant, or are planning to become pregnant. If you drink alcohol: Limit how much you have to 0-1 drink a day. Know how much alcohol is in your drink. In the [place] one drink equals one 12 oz bottle of beer (355 mL), one 5 oz glass of wine (148  mL), or one 1 oz glass of hard liquor (44 mL). Lifestyle Brush your teeth every morning and night with fluoride toothpaste. Floss one time each day. Exercise for at least 30 minutes  5 or more days each week. Do not use any products that contain nicotine or tobacco. These products include cigarettes, chewing tobacco, and vaping devices, such as e-cigarettes. If you need help quitting, ask your health care provider. Do not use drugs. If you are sexually active, practice safe sex. Use a condom or other form of protection to prevent STIs. If you do not wish to become pregnant, use a form of birth control. If you plan to become pregnant, see your health care provider for a prepregnancy visit. Take aspirin only as told by your health care provider. Make sure that you understand how much to take and what form to take. Work with your health care provider to find out whether it is safe and beneficial for you to take aspirin daily. Find healthy ways to manage stress, such as: Meditation, yoga, or listening to music. Journaling. Talking to a trusted person. Spending time with friends and family. Minimize exposure to UV radiation to reduce your risk of skin cancer. Safety Always wear your seat belt while driving or riding in a vehicle. Do not drive: If you have been drinking alcohol. Do not ride with someone who has been drinking. When you are tired or distracted. While texting. If you have been using any mind-altering substances or drugs. Wear a helmet and other protective equipment during sports activities. If you have firearms in your house, make sure you follow all gun safety procedures. Seek help if you have been physically or sexually abused. What's next? Visit your health care provider once a year for an annual wellness visit. Ask your health care provider how often you should have your eyes and teeth checked. Stay up to date on all vaccines. This information is not intended to replace advice given to you by your health care provider. Make sure you discuss any questions you have with your health care provider. Document Revised: 06/30/2020 Document Reviewed:  06/30/2020 Elsevier Patient Education  [place].

## 2021-11-11 ENCOUNTER — Encounter: Payer: Self-pay | Admitting: Internal Medicine

## 2021-11-11 ENCOUNTER — Encounter
Payer: BC Managed Care – PPO | Attending: Physical Medicine and Rehabilitation | Admitting: Physical Medicine and Rehabilitation

## 2021-11-11 ENCOUNTER — Encounter: Payer: Self-pay | Admitting: Physical Medicine and Rehabilitation

## 2021-11-11 ENCOUNTER — Telehealth: Payer: Self-pay | Admitting: Family Medicine

## 2021-11-11 VITALS — BP 116/77 | HR 94 | Ht 66.0 in | Wt 252.8 lb

## 2021-11-11 DIAGNOSIS — Z7409 Other reduced mobility: Secondary | ICD-10-CM | POA: Insufficient documentation

## 2021-11-11 DIAGNOSIS — Z5181 Encounter for therapeutic drug level monitoring: Secondary | ICD-10-CM | POA: Diagnosis not present

## 2021-11-11 DIAGNOSIS — G894 Chronic pain syndrome: Secondary | ICD-10-CM | POA: Insufficient documentation

## 2021-11-11 DIAGNOSIS — G6289 Other specified polyneuropathies: Secondary | ICD-10-CM | POA: Insufficient documentation

## 2021-11-11 DIAGNOSIS — Z79891 Long term (current) use of opiate analgesic: Secondary | ICD-10-CM | POA: Insufficient documentation

## 2021-11-11 MED ORDER — GABAPENTIN 300 MG PO CAPS: 300.0000 mg | ORAL_CAPSULE | ORAL | 1 refills | Status: DC

## 2021-11-11 NOTE — Telephone Encounter (Signed)
Encourage patient to contact the pharmacy for refills or they can request refills through [facility]  (Please schedule appointment if patient has not been seen in over a year)    WHAT PHARMACY WOULD THEY LIKE THIS SENT TO: [facility] [address], [place] - [place] [address]  MEDICATION NAME & DOSE: gabapentin 300 mg   NOTES/COMMENTS FROM PATIENT: Pt stated that on her OV appt on 09/15/21 Dr.Greene stated that he would refill her gabapentin 300 mg.  pt also stated that Dr.greene suppose to change the direction of her medication as while. Pt takes I cap at breakfast, 2 at lunch and 3 Dinner. Pt states that Dr.Greene suppose to change it 1 cap at breakfast , 2 at lunch and 4 at dinner. Pt states that she only have two days of medication.      [facility] office please notify patient: It takes 48-72 hours to process rx refill requests Ask patient to call pharmacy to ensure rx is ready before heading there.

## 2021-11-11 NOTE — Progress Notes (Signed)
CSA/OPIOID TREATMENT AGREEMENT SIGNED AND UDS done.

## 2021-11-11 NOTE — Addendum Note (Signed)
Addended by: Caro Hight on: 11/11/2021 11:24 AM   Modules accepted: Orders

## 2021-11-11 NOTE — Progress Notes (Signed)
Subjective:    Patient ID: Teresa Castaneda, female    DOB: 11-20-1970, 51 y.o.   MRN: [id number]  HPI [name] is a 51 year old woman who presents to establish care for neuropathy,  1) Neuropathy -started about 6 months ago -has tried over the counter cream, lidocaine patch -has tried Benadryl -cannot ascend her stairs -has tried [employer] -she takes Gabapentin, Cymbalta  2) Impaired mobility secondary to pain from neuropathy  Pain Inventory Average Pain 6 Pain Right Now 4 My pain is constant, burning, stabbing, and tingling  In the last 24 hours, has pain interfered with the following? General activity 6 Relation with others 7 Enjoyment of life 9 What TIME of day is your pain at its worst? morning , daytime, evening, and night Sleep (in general) Fair  Pain is worse with: walking, standing, and some activites Pain improves with: medication Relief from Meds: 5  walk without assistance how many minutes can you walk? 10 ability to climb steps?  yes do you drive?  yes  employed # of hrs/week 24 what is your job? [employer]  trouble walking depression anxiety  Any changes since last visit?  no  Any changes since last visit?  no    Family History  Problem Relation Age of Onset   Coronary artery disease Mother    Diabetes Mother    COPD Mother    Coronary artery disease Father    Hypertension Father    Colon cancer Neg Hx    Esophageal cancer Neg Hx    Social History   Socioeconomic History   Marital status: Single    Spouse name: Not on file   Number of children: Not on file   Years of education: Not on file   Highest education level: Not on file  Occupational History   Not on file  Tobacco Use   Smoking status: Some Days    Packs/day: 0.50    Types: Cigarettes    Start date: 01/16/1985    Last attempt to quit: 10/08/2019    Years since quitting: 2.0   Smokeless tobacco: Never   Tobacco comments:    7- 8 cig a week  Vaping Use    Vaping Use: Never used  Substance and Sexual Activity   Alcohol use: No   Drug use: No   Sexual activity: Not on file  Other Topics Concern   Not on file  Social History Narrative   Right Handed    Caffeine rare   Live with husband and mother in law in a two story hpme   Social Determinants of Health   Financial Resource Strain: Not on file  Food Insecurity: Not on file  Transportation Needs: Not on file  Physical Activity: Not on file  Stress: Not on file  Social Connections: Not on file   Past Surgical History:  Procedure Laterality Date   ABDOMINAL HYSTERECTOMY  02/21/2003   CHOLECYSTECTOMY N/A 05/10/2020   Procedure: LAPAROSCOPIC CHOLECYSTECTOMY WITH INTRAOPERATIVE CHOLANGIOGRAM AND LIVER BIOPSY;  Surgeon: Felicie Morn, MD;  Location: [facility];  Service: General;  Laterality: N/A;   DG SACROILIAC JOINTS ([place] HX)  01/11/2021   [facility]   ENDOSCOPIC TURBINATE REDUCTION Bilateral 10/13/2020   Procedure: ENDOSCOPIC BILATERAL INFERIOR TURBINATE REDUCTIONS;  Surgeon: Rozetta Nunnery, MD;  Location: [facility];  Service: ENT;  Laterality: Bilateral;   ETHMOIDECTOMY Bilateral 10/13/2020   Procedure: BILATERAL TOTAL ETHMOIDECTOMY AND MAXILLARY OSTIA ENLARGEMENTS;  Surgeon: Melony Overly  E, MD;  Location: [facility];  Service: ENT;  Laterality: Bilateral;   LIVER BIOPSY     NASAL SINUS SURGERY Bilateral 10/13/2020   Procedure: SINUS ENDOSCOPY WITH STEALTH NAVIGATION;  Surgeon: Rozetta Nunnery, MD;  Location: [facility];  Service: ENT;  Laterality: Bilateral;   TONSILLECTOMY     Past Medical History:  Diagnosis Date   Allergies    Anxiety    Arthritis    Asthma    Back pain    Spinal Issue    Diabetes mellitus without complication (HCC)    DJD (degenerative joint disease), lumbar    Elevated cholesterol    Gallstone    GERD (gastroesophageal reflux disease)    Liver cirrhosis secondary to NASH  (HCC)    Neuromuscular disorder (HCC)    Obesity    Oxygen deficiency    Thyroid disease    BP 116/77   Pulse 94   Ht 5' 6"  (1.676 m)   Wt 252 lb 12.8 oz (114.7 kg)   SpO2 97%   BMI 40.80 kg/m   Opioid Risk Score:   Fall Risk Score:  `1  Depression screen [facility] 2/9     11/11/2021   10:54 AM 11/04/2021    8:06 AM 05/02/2021    8:47 AM 03/02/2021   11:16 AM 01/27/2021   11:39 AM 12/02/2020   10:48 AM 10/18/2020    3:37 PM  Depression screen PHQ 2/9  Decreased Interest 1 1 2 1 2 1 2   Down, Depressed, Hopeless 3 2 1 1 2 1 1   PHQ - 2 Score 4 3 3 2 4 2 3   Altered sleeping 1 3 2 2 2 2 3   Tired, decreased energy 3 3 2 2 2 1 3   Change in appetite 2 1 1  0 1 1 2   Feeling bad or failure about yourself  2 0 1 0 2 0 1  Trouble concentrating 1 2 1 1 1 1 1   Moving slowly or fidgety/restless 0 0 2 0 1 0 2  Suicidal thoughts 0 0 0 0 0 0 0  PHQ-9 Score 13 12 12 7 13 7 15   Difficult doing work/chores Very difficult    Not difficult at all       Review of Systems  Constitutional: Negative.   HENT: Negative.    Eyes: Negative.   Respiratory: Negative.    Cardiovascular: Negative.   Gastrointestinal: Negative.   Endocrine:       High blood sugars  Genitourinary: Negative.   Musculoskeletal:  Positive for gait problem.  Skin: Negative.   Allergic/Immunologic: Negative.   Neurological:  Positive for numbness.       Tingling neuropathy  Hematological: Negative.   Psychiatric/Behavioral:  Positive for dysphoric mood. The patient is nervous/anxious.   All other systems reviewed and are negative.      Objective:   Physical Exam Gen: no distress, normal appearing HEENT: oral mucosa pink and moist, NCAT Cardio: Reg rate Chest: normal effort, normal rate of breathing Abd: soft, non-distended Ext: no edema Psych: pleasant, normal affect Skin: intact Neuro: Alert and oriented x3 Sensation decreased in the plantar aspect     Assessment & Plan:   1) Chronic Pain Syndrome  secondary to peripheral neuropathy -Discussed current symptoms of pain and history of pain.  -Discussed Qutenza as an option for neuropathic pain control. Discussed that this is a capsaicin patch, stronger than capsaicin cream. Discussed that it is currently approved for diabetic  peripheral neuropathy and post-herpetic neuralgia, but that it has also shown benefit in treating other forms of neuropathy. Provided patient with link to site to learn more about the patch: CinemaBonus.fr. Discussed that the patch would be placed in office and benefits usually last 3 months. Discussed that unintended exposure to capsaicin can cause severe irritation of eyes, mucous membranes, respiratory tract, and skin, but that Qutenza is a local treatment and does not have the systemic side effects of other nerve medications. Discussed that there may be pain, itching, erythema, and decreased sensory function associated with the application of Qutenza. Side effects usually subside within 1 week. A cold pack of analgesic medications can help with these side effects. Blood pressure can also be increased due to pain associated with administration of the patch.  -Discussed benefits of exercise in reducing pain. -pain contract and urine sample today -when I get the results in 7 days I can prescribe the Tramadol for her, BID PRN -Discussed following foods that may reduce pain: 1) Ginger (especially studied for arthritis)- reduce leukotriene production to decrease inflammation 2) Blueberries- high in phytonutrients that decrease inflammation 3) Salmon- marine omega-3s reduce joint swelling and pain 4) Pumpkin seeds- reduce inflammation 5) dark chocolate- reduces inflammation 6) turmeric- reduces inflammation 7) tart cherries - reduce pain and stiffness 8) extra virgin olive oil - its compound olecanthal helps to block prostaglandins  9) chili peppers- can be eaten or applied topically via capsaicin 10) mint- helpful  for headache, muscle aches, joint pain, and itching 11) garlic- reduces inflammation  Link to further information on diet for chronic pain: http://www.randall.com/   2) Impaired mobility: -discussed that this is secondary to pain -discussed that she does not have balance deficits.

## 2021-11-11 NOTE — Telephone Encounter (Signed)
Patient notes she needs a refill however notes that at last visit you had stated you were to change her dosing   Can you update Rx or please advise other action

## 2021-11-11 NOTE — Telephone Encounter (Signed)
Additional 300 mg dose at bedtime recommended at her August 31 visit.  This resulted in a dosage of 1 in the morning, 2 at noon, 4 at night.  I will send in a new prescription to reflect this dosing.  Thanks.

## 2021-11-11 NOTE — Telephone Encounter (Signed)
Called and informed patient she thanked [place] for our time

## 2021-11-16 LAB — TOXASSURE SELECT,+ANTIDEPR,UR

## 2021-11-17 ENCOUNTER — Encounter: Payer: Self-pay | Admitting: Physical Medicine and Rehabilitation

## 2021-11-17 ENCOUNTER — Telehealth: Payer: Self-pay

## 2021-11-17 MED ORDER — TRAMADOL HCL 50 MG PO TABS: 100.0000 mg | ORAL_TABLET | Freq: Four times a day (QID) | ORAL | 0 refills | Status: DC | PRN

## 2021-11-17 NOTE — Addendum Note (Signed)
Addended by: Izora Ribas on: 11/17/2021 11:58 AM   Modules accepted: Orders

## 2021-11-17 NOTE — Telephone Encounter (Signed)
Close thi please

## 2021-11-17 NOTE — Telephone Encounter (Signed)
PA submitted Tramadol

## 2021-11-18 NOTE — Telephone Encounter (Signed)
Approved 11/18/21-05/17/22

## 2021-11-21 ENCOUNTER — Other Ambulatory Visit: Payer: Self-pay | Admitting: Gastroenterology

## 2021-11-24 ENCOUNTER — Encounter: Payer: Self-pay | Admitting: Gastroenterology

## 2021-11-25 ENCOUNTER — Other Ambulatory Visit: Payer: Self-pay

## 2021-11-25 DIAGNOSIS — K7460 Unspecified cirrhosis of liver: Secondary | ICD-10-CM

## 2021-11-25 MED ORDER — RIFAXIMIN 550 MG PO TABS: 550.0000 mg | ORAL_TABLET | Freq: Two times a day (BID) | ORAL | 12 refills | Status: AC

## 2021-11-28 ENCOUNTER — Encounter: Payer: Self-pay | Admitting: Physical Medicine and Rehabilitation

## 2021-11-28 ENCOUNTER — Telehealth: Payer: Self-pay | Admitting: *Deleted

## 2021-11-28 NOTE — Telephone Encounter (Signed)
Urine drug screen is positive for Tramadol. It is impossible for me to say whether this is consistent or not as there is no documentation in MD note or self inventory (H&P) or medications notating that she was taking Tramadol at time of first visit.  Per PMP the last Rx was 09/07/21 #24 tablets.

## 2021-11-29 ENCOUNTER — Other Ambulatory Visit: Payer: Self-pay | Admitting: Physical Medicine and Rehabilitation

## 2021-11-29 MED ORDER — TRAMADOL HCL 50 MG PO TABS: 100.0000 mg | ORAL_TABLET | Freq: Four times a day (QID) | ORAL | 0 refills | Status: DC | PRN

## 2021-12-06 ENCOUNTER — Telehealth: Payer: Self-pay | Admitting: Lab

## 2021-12-06 NOTE — Telephone Encounter (Signed)
No not needed

## 2021-12-06 NOTE — Telephone Encounter (Signed)
No note needed 

## 2021-12-22 ENCOUNTER — Other Ambulatory Visit: Payer: Self-pay | Admitting: Sports Medicine

## 2021-12-26 ENCOUNTER — Ambulatory Visit: Payer: BC Managed Care – PPO

## 2021-12-28 ENCOUNTER — Ambulatory Visit
Admission: RE | Admit: 2021-12-28 | Discharge: 2021-12-28 | Disposition: A | Discharge status: Home / Self Care | Payer: BC Managed Care – PPO | Source: Ambulatory Visit | Attending: Family Medicine | Admitting: Family Medicine

## 2021-12-28 ENCOUNTER — Encounter: Payer: Self-pay | Admitting: Physical Medicine and Rehabilitation

## 2021-12-28 DIAGNOSIS — Z1231 Encounter for screening mammogram for malignant neoplasm of breast: Secondary | ICD-10-CM

## 2021-12-29 ENCOUNTER — Other Ambulatory Visit: Payer: Self-pay | Admitting: Physical Medicine and Rehabilitation

## 2022-01-06 ENCOUNTER — Other Ambulatory Visit: Payer: Self-pay | Admitting: Lab

## 2022-01-06 DIAGNOSIS — E1165 Type 2 diabetes mellitus with hyperglycemia: Secondary | ICD-10-CM

## 2022-01-06 MED ORDER — GABAPENTIN 300 MG PO CAPS: 300.0000 mg | ORAL_CAPSULE | ORAL | 1 refills | Status: DC

## 2022-01-29 ENCOUNTER — Other Ambulatory Visit: Payer: Self-pay | Admitting: Physical Medicine and Rehabilitation

## 2022-01-30 ENCOUNTER — Encounter: Payer: Self-pay | Admitting: Physical Medicine and Rehabilitation

## 2022-01-30 ENCOUNTER — Ambulatory Visit: Payer: BC Managed Care – PPO | Admitting: Physical Medicine and Rehabilitation

## 2022-01-30 ENCOUNTER — Other Ambulatory Visit: Payer: Self-pay | Admitting: Physical Medicine and Rehabilitation

## 2022-01-30 ENCOUNTER — Other Ambulatory Visit: Payer: Self-pay | Admitting: Sports Medicine

## 2022-01-31 ENCOUNTER — Telehealth: Payer: Self-pay | Admitting: Family Medicine

## 2022-01-31 NOTE — Telephone Encounter (Signed)
I received a mychart massage from this patient.    Appointment Request From: Teresa Castaneda   With Provider: Wendie Agreste, MD [facility]]   Preferred Date Range: From 01/30/2022 To 04/05/2022   Preferred Times: Any   Reason: To address the following health maintenance concerns. Diabetic Kidney Evaluation - Urine Acr Zoster Vaccines- Shingrix   Comments: I need these test done. It says it's time on my chart   What is she due for?

## 2022-01-31 NOTE — Telephone Encounter (Signed)
Pt called back and was scheduled for a nurse visit for shingles

## 2022-01-31 NOTE — Telephone Encounter (Signed)
Filled  Written  ID  Drug  QTY  Days  Prescriber  RX #  Dispenser  Refill  Daily Dose*  Pymt Type  PMP  12/29/2021 12/29/2021 1  Tramadol Hcl 50 Mg Tablet 120.00 15 [facility] [address] [id number] [facility] (1224) 0/0 80.00 MME Comm Ins [place] 11/29/2021 11/29/2021 1  Tramadol Hcl 50 Mg Tablet 120.00 15 Kr [facility]au [id number] [facility] (1224) 0/0 80.00 MME Comm Ins [place]

## 2022-02-01 ENCOUNTER — Ambulatory Visit (INDEPENDENT_AMBULATORY_CARE_PROVIDER_SITE_OTHER): Payer: BC Managed Care – PPO

## 2022-02-01 DIAGNOSIS — Z23 Encounter for immunization: Secondary | ICD-10-CM | POA: Diagnosis not present

## 2022-02-01 NOTE — Progress Notes (Signed)
Pt presented today for first shingles vaccine no concerns expressed did discuss dose schedule today

## 2022-02-16 ENCOUNTER — Encounter
Payer: BC Managed Care – PPO | Attending: Physical Medicine and Rehabilitation | Admitting: Physical Medicine and Rehabilitation

## 2022-02-16 VITALS — BP 112/77 | HR 98 | Ht 66.0 in | Wt 251.6 lb

## 2022-02-16 DIAGNOSIS — G6289 Other specified polyneuropathies: Secondary | ICD-10-CM | POA: Diagnosis not present

## 2022-02-16 DIAGNOSIS — G4701 Insomnia due to medical condition: Secondary | ICD-10-CM | POA: Diagnosis not present

## 2022-02-16 MED ORDER — AMITRIPTYLINE HCL 10 MG PO TABS: 10.0000 mg | ORAL_TABLET | Freq: Every day | ORAL | 1 refills | Status: DC

## 2022-02-16 MED ORDER — TRAMADOL HCL 100 MG PO TABS: 1.0000 | ORAL_TABLET | Freq: Four times a day (QID) | ORAL | 3 refills | Status: DC | PRN

## 2022-02-16 NOTE — Patient Instructions (Addendum)
Foods that may reduce pain: 1) Ginger (especially studied for arthritis)- reduce leukotriene production to decrease inflammation 2) Blueberries- high in phytonutrients that decrease inflammation 3) Salmon- marine omega-3s reduce joint swelling and pain 4) Pumpkin seeds- reduce inflammation 5) dark chocolate- reduces inflammation 6) turmeric- reduces inflammation 7) tart cherries - reduce pain and stiffness 8) extra virgin olive oil - its compound olecanthal helps to block prostaglandins  9) chili peppers- can be eaten or applied topically via capsaicin 10) mint- helpful for headache, muscle aches, joint pain, and itching 11) garlic- reduces inflammation  Link to further information on diet for chronic pain: https://www.practicalpainmanagement.com/treatments/complementary/diet-patients-chronic-pain    Insomnia: -Try to go outside near sunrise -Get exercise during the day.  -Turn off all devices an hour before bedtime.  -Teas that can benefit: chamomile, valerian root, Brahmi (Bacopa) -Can consider over the counter melatonin, magnesium, and/or L-theanine. Melatonin is an anti-oxidant with multiple health benefits. Magnesium is involved in greater than 300 enzymatic reactions in the body and most of us are deficient as our soil is often depleted. There are 7 different types of magnesium- Bioptemizer's is a supplement with all 7 types, and each has unique benefits. Magnesium can also help with constipation and anxiety.  -Pistachios naturally increase the production of melatonin -[employer]zy Earth bamboo bed sheets are free from toxic chemicals.  -Tart cherry juice or a tart cherry supplement can improve sleep and soreness post-workout   

## 2022-02-16 NOTE — Progress Notes (Signed)
Subjective:    Patient ID: Teresa Castaneda, female    DOB: 1970-10-17, 52 y.o.   MRN: [id number]  HPI  Teresa Castaneda is a 52 year old woman who presents for follow-up of neuropathy 1) Peripheral neuropathy -tramadol helps for 3 hours, she always takes two '50mg'$  tablets at a time.  -pain is getting worse -she is taking 120 cymbalta  2) Insomnia -sleeping poorly at night  Pain Inventory Average Pain 5 Pain Right Now 7 My pain is sharp, burning, stabbing, tingling, and aching  In the last 24 hours, has pain interfered with the following? General activity 4 Relation with others 5 Enjoyment of life 4 What TIME of day is your pain at its worst? morning  Sleep (in general) Poor  Pain is worse with: walking, standing, and some activites Pain improves with: rest and medication Relief from Meds: 6  Family History  Problem Relation Age of Onset   Coronary artery disease Mother    Diabetes Mother    COPD Mother    Coronary artery disease Father    Hypertension Father    Colon cancer Neg Hx    Esophageal cancer Neg Hx    Social History   Socioeconomic History   Marital status: Single    Spouse name: Not on file   Number of children: Not on file   Years of education: Not on file   Highest education level: Not on file  Occupational History   Not on file  Tobacco Use   Smoking status: Some Days    Packs/day: 0.50    Types: Cigarettes    Start date: 01/16/1985    Last attempt to quit: 10/08/2019    Years since quitting: 2.3   Smokeless tobacco: Never   Tobacco comments:    7- 8 cig a week  Vaping Use   Vaping Use: Never used  Substance and Sexual Activity   Alcohol use: No   Drug use: No   Sexual activity: Not on file  Other Topics Concern   Not on file  Social History Narrative   Right Handed    Caffeine rare   Live with husband and mother in law in a two story hpme   Social Determinants of Health   Financial Resource Strain: Not on file  Food Insecurity: Not  on file  Transportation Needs: Not on file  Physical Activity: Not on file  Stress: Not on file  Social Connections: Not on file   Past Surgical History:  Procedure Laterality Date   ABDOMINAL HYSTERECTOMY  02/21/2003   CHOLECYSTECTOMY N/A 05/10/2020   Procedure: LAPAROSCOPIC CHOLECYSTECTOMY WITH INTRAOPERATIVE CHOLANGIOGRAM AND LIVER BIOPSY;  Surgeon: Felicie Morn, MD;  Location: [facility];  Service: General;  Laterality: N/A;   DG SACROILIAC JOINTS ([place] HX)  01/11/2021   [facility]   ENDOSCOPIC TURBINATE REDUCTION Bilateral 10/13/2020   Procedure: ENDOSCOPIC BILATERAL INFERIOR TURBINATE REDUCTIONS;  Surgeon: Rozetta Nunnery, MD;  Location: [place];  Service: ENT;  Laterality: Bilateral;   ETHMOIDECTOMY Bilateral 10/13/2020   Procedure: BILATERAL TOTAL ETHMOIDECTOMY AND MAXILLARY OSTIA ENLARGEMENTS;  Surgeon: Rozetta Nunnery, MD;  Location: [place];  Service: ENT;  Laterality: Bilateral;   LIVER BIOPSY     NASAL SINUS SURGERY Bilateral 10/13/2020   Procedure: SINUS ENDOSCOPY WITH STEALTH NAVIGATION;  Surgeon: Rozetta Nunnery, MD;  Location: [place];  Service: ENT;  Laterality: Bilateral;   TONSILLECTOMY     Past Surgical History:  Procedure Laterality Date   ABDOMINAL HYSTERECTOMY  02/21/2003   CHOLECYSTECTOMY N/A 05/10/2020   Procedure: LAPAROSCOPIC CHOLECYSTECTOMY WITH INTRAOPERATIVE CHOLANGIOGRAM AND LIVER BIOPSY;  Surgeon: Felicie Morn, MD;  [facility];  Service: General;  Laterality: N/A;   DG SACROILIAC JOINTS ([place] HX)  01/11/2021   [facility]   ENDOSCOPIC TURBINATE REDUCTION Bilateral 10/13/2020   Procedure: ENDOSCOPIC BILATERAL INFERIOR TURBINATE REDUCTIONS;  Surgeon: Rozetta Nunnery, MD;  Location: [place];  Service: ENT;  Laterality: Bilateral;   ETHMOIDECTOMY Bilateral 10/13/2020   Procedure: BILATERAL TOTAL ETHMOIDECTOMY AND MAXILLARY  OSTIA ENLARGEMENTS;  Surgeon: Rozetta Nunnery, MD;  Location: [place];  Service: ENT;  Laterality: Bilateral;   LIVER BIOPSY     NASAL SINUS SURGERY Bilateral 10/13/2020   Procedure: SINUS ENDOSCOPY WITH STEALTH NAVIGATION;  Surgeon: Rozetta Nunnery, MD;  Location: [place];  Service: ENT;  Laterality: Bilateral;   TONSILLECTOMY     Past Medical History:  Diagnosis Date   Allergies    Anxiety    Arthritis    Asthma    Back pain    Spinal Issue    Diabetes mellitus without complication (HCC)    DJD (degenerative joint disease), lumbar    Elevated cholesterol    Gallstone    GERD (gastroesophageal reflux disease)    Liver cirrhosis secondary to NASH (HCC)    Neuromuscular disorder (HCC)    Obesity    Oxygen deficiency    Thyroid disease    BP 112/77   Pulse 98   Ht '5\' 6"'$  (1.676 m)   Wt 251 lb 9.6 oz (114.1 kg)   SpO2 96%   BMI 40.61 kg/m   Opioid Risk Score:   Fall Risk Score:  `1  Depression screen [facility] 2/9     11/11/2021   10:54 AM 11/04/2021    8:06 AM 05/02/2021    8:47 AM 03/02/2021   11:16 AM 01/27/2021   11:39 AM 12/02/2020   10:48 AM 10/18/2020    3:37 PM  Depression screen [facility] 2/9  Decreased Interest '1 1 2 1 2 1 2  '$ Down, Depressed, Hopeless '3 2 1 1 2 1 1  '$ PHQ - 2 Score '4 3 3 2 4 2 3  '$ Altered sleeping '1 3 2 2 2 2 3  '$ Tired, decreased energy '3 3 2 2 2 1 3  '$ Change in appetite '2 1 1 '$ 0 '1 1 2  '$ Feeling bad or failure about yourself  2 0 1 0 2 0 1  Trouble concentrating '1 2 1 1 1 1 1  '$ Moving slowly or fidgety/restless 0 0 2 0 1 0 2  Suicidal thoughts 0 0 0 0 0 0 0  PHQ-9 Score '13 12 12 7 13 7 15  '$ Difficult doing work/chores Very difficult    Not difficult at all        Review of Systems  Musculoskeletal:        Bilateral foot pain  All other systems reviewed and are negative.     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  -increase Tramadol to '100mg'$  q6H prn -Discussed benefits of exercise in reducing pain. -Discussed following foods that may reduce pain: 1) Ginger (especially studied for arthritis)- reduce leukotriene production to decrease inflammation 2) Blueberries- high in phytonutrients that decrease inflammation 3) Salmon- marine omega-3s reduce joint swelling and pain 4) Pumpkin seeds- reduce inflammation 5) dark chocolate- reduces inflammation 6) turmeric- reduces inflammation 7) tart cherries - reduce pain and stiffness 8) extra virgin olive oil - its compound olecanthal helps to block prostaglandins  9) chili peppers- can be eaten or applied topically via capsaicin 10) mint- helpful for headache, muscle aches, joint pain, and itching 11) garlic- reduces inflammation  Link to further information on diet for chronic pain: http://www.randall.com/   2) Insomnia: -Try to go outside near sunrise -Get exercise during the day.  -Turn off all devices an hour before bedtime.  -Teas that can benefit: chamomile, valerian root, Brahmi (Bacopa) -Can consider over the counter melatonin, magnesium, and/or L-theanine. Melatonin is an anti-oxidant with multiple health benefits. Magnesium is involved in greater than 300 enzymatic reactions in the body and most of [place] are deficient as our soil is often depleted. There are 7 different types of magnesium- Bioptemizer's is a supplement with all 7 types, and each has unique benefits. Magnesium can also help with constipation and anxiety.  -Pistachios naturally increase the  production of melatonin -Cozy Earth bamboo bed sheets are free from toxic chemicals.  -Tart cherry juice or a tart cherry supplement can improve sleep and soreness post-workout

## 2022-02-17 ENCOUNTER — Telehealth: Payer: Self-pay

## 2022-02-17 NOTE — Telephone Encounter (Signed)
PA submitted for Tramadol 

## 2022-02-20 NOTE — Telephone Encounter (Signed)
denied °

## 2022-02-21 ENCOUNTER — Encounter (HOSPITAL_BASED_OUTPATIENT_CLINIC_OR_DEPARTMENT_OTHER): Payer: BC Managed Care – PPO | Admitting: Physical Medicine and Rehabilitation

## 2022-02-21 DIAGNOSIS — G6289 Other specified polyneuropathies: Secondary | ICD-10-CM | POA: Diagnosis not present

## 2022-02-21 MED ORDER — TRAMADOL HCL 50 MG PO TABS: 100.0000 mg | ORAL_TABLET | Freq: Four times a day (QID) | ORAL | 0 refills | Status: DC | PRN

## 2022-02-21 NOTE — Progress Notes (Signed)
Subjective:    Patient ID: Teresa Castaneda, female    DOB: 02-10-1970, 52 y.o.   MRN: [id number]  HPI An audio/video tele-health visit is felt to be the most appropriate encounter for this patient at this time. This is a follow up tele-visit via phone. The patient is at home. MD is at office. Prior to scheduling this appointment, our staff discussed the limitations of evaluation and management by telemedicine and the availability of in-person appointments. The patient expressed understanding and agreed to proceed.   Teresa Castaneda is a 52 year old woman who presents for f/u of neuropathy 1) Peripheral neuropathy -tramadol helps for 3 hours, she always takes two '50mg'$  tablets at a time. She notes that she contacted pharmacy yesterday and they told her that the reason the medication was denied was because it was ordered as '100mg'$  tablets.  -she would like to try Qutenza in the future if it is fully covered -pain is getting worse -she is taking 120 cymbalta  2) Insomnia -sleeping poorly at night  Pain Inventory Average Pain 5 Pain Right Now 7 My pain is sharp, burning, stabbing, tingling, and aching  In the last 24 hours, has pain interfered with the following? General activity 4 Relation with others 5 Enjoyment of life 4 What TIME of day is your pain at its worst? morning  Sleep (in general) Poor  Pain is worse with: walking, standing, and some activites Pain improves with: rest and medication Relief from Meds: 6  Family History  Problem Relation Age of Onset   Coronary artery disease Mother    Diabetes Mother    COPD Mother    Coronary artery disease Father    Hypertension Father    Colon cancer Neg Hx    Esophageal cancer Neg Hx    Social History   Socioeconomic History   Marital status: Single    Spouse name: Not on file   Number of children: Not on file   Years of education: Not on file   Highest education level: Not on file  Occupational History   Not on file   Tobacco Use   Smoking status: Some Days    Packs/day: 0.50    Types: Cigarettes    Start date: 01/16/1985    Last attempt to quit: 10/08/2019    Years since quitting: 2.3   Smokeless tobacco: Never   Tobacco comments:    7- 8 cig a week  Vaping Use   Vaping Use: Never used  Substance and Sexual Activity   Alcohol use: No   Drug use: No   Sexual activity: Not on file  Other Topics Concern   Not on file  Social History Narrative   Right Handed    Caffeine rare   Live with husband and mother in law in a two story hpme   Social Determinants of Health   Financial Resource Strain: Not on file  Food Insecurity: Not on file  Transportation Needs: Not on file  Physical Activity: Not on file  Stress: Not on file  Social Connections: Not on file   Past Surgical History:  Procedure Laterality Date   ABDOMINAL HYSTERECTOMY  02/21/2003   CHOLECYSTECTOMY N/A 05/10/2020   Procedure: LAPAROSCOPIC CHOLECYSTECTOMY WITH INTRAOPERATIVE CHOLANGIOGRAM AND LIVER BIOPSY;  Surgeon: Felicie Morn, MD;  [facility];  Service: General;  Laterality: N/A;   DG SACROILIAC JOINTS ([place] HX)  01/11/2021   [facility]   ENDOSCOPIC TURBINATE REDUCTION Bilateral 10/13/2020   Procedure:  ENDOSCOPIC BILATERAL INFERIOR TURBINATE REDUCTIONS;  Surgeon: Rozetta Nunnery, MD;  Location: [place];  Service: ENT;  Laterality: Bilateral;   ETHMOIDECTOMY Bilateral 10/13/2020   Procedure: BILATERAL TOTAL ETHMOIDECTOMY AND MAXILLARY OSTIA ENLARGEMENTS;  Surgeon: Rozetta Nunnery, MD;  Location: [place];  Service: ENT;  Laterality: Bilateral;   LIVER BIOPSY     NASAL SINUS SURGERY Bilateral 10/13/2020   Procedure: SINUS ENDOSCOPY WITH STEALTH NAVIGATION;  Surgeon: Rozetta Nunnery, MD;  Location: [place];  Service: ENT;  Laterality: Bilateral;   TONSILLECTOMY     Past Surgical History:  Procedure Laterality Date   ABDOMINAL  HYSTERECTOMY  02/21/2003   CHOLECYSTECTOMY N/A 05/10/2020   Procedure: LAPAROSCOPIC CHOLECYSTECTOMY WITH INTRAOPERATIVE CHOLANGIOGRAM AND LIVER BIOPSY;  Surgeon: Felicie Morn, MD;  [facility];  Service: General;  Laterality: N/A;   DG SACROILIAC JOINTS ([place] HX)  01/11/2021   [facility]   ENDOSCOPIC TURBINATE REDUCTION Bilateral 10/13/2020   Procedure: ENDOSCOPIC BILATERAL INFERIOR TURBINATE REDUCTIONS;  Surgeon: Rozetta Nunnery, MD;  Location: [place];  Service: ENT;  Laterality: Bilateral;   ETHMOIDECTOMY Bilateral 10/13/2020   Procedure: BILATERAL TOTAL ETHMOIDECTOMY AND MAXILLARY OSTIA ENLARGEMENTS;  Surgeon: Rozetta Nunnery, MD;  Location: [place];  Service: ENT;  Laterality: Bilateral;   LIVER BIOPSY     NASAL SINUS SURGERY Bilateral 10/13/2020   Procedure: SINUS ENDOSCOPY WITH STEALTH NAVIGATION;  Surgeon: Rozetta Nunnery, MD;  Location: [place];  Service: ENT;  Laterality: Bilateral;   TONSILLECTOMY     Past Medical History:  Diagnosis Date   Allergies    Anxiety    Arthritis    Asthma    Back pain    Spinal Issue    Diabetes mellitus without complication (HCC)    DJD (degenerative joint disease), lumbar    Elevated cholesterol    Gallstone    GERD (gastroesophageal reflux disease)    Liver cirrhosis secondary to NASH ([place])    Neuromuscular disorder ([place])    Obesity    Oxygen deficiency    Thyroid disease    There were no vitals taken for this visit.  Opioid Risk Score:   Fall Risk Score:  `1  Depression screen [facility] 2/9     11/11/2021   10:54 AM 11/04/2021    8:06 AM 05/02/2021    8:47 AM 03/02/2021   11:16 AM 01/27/2021   11:39 AM 12/02/2020   10:48 AM 10/18/2020    3:37 PM  Depression screen [facility] 2/9  Decreased Interest '1 1 2 1 2 1 2  '$ Down, Depressed, Hopeless '3 2 1 1 2 1 1  '$ PHQ - 2 Score '4 3 3 2 4 2 3  '$ Altered sleeping '1 3 2 2 2 2 3  '$ Tired, decreased energy '3 3 2  2 2 1 3  '$ Change in appetite '2 1 1 '$ 0 '1 1 2  '$ Feeling bad or failure about yourself  2 0 1 0 2 0 1  Trouble concentrating '1 2 1 1 1 1 1  '$ Moving slowly or fidgety/restless 0 0 2 0 1 0 2  Suicidal thoughts 0 0 0 0 0 0 0  PHQ-9 Score '13 12 12 7 13 7 15  '$ Difficult doing work/chores Very difficult    Not difficult at all        Review of Systems  Musculoskeletal:        Bilateral foot pain  All other  systems reviewed and are negative.      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  -increase Tramadol to '100mg'$  q6H prn, have sent in new script today as patient was told it was denied yesterday due to the request for '100mg'$  tablets -Discussed Qutenza as an option for neuropathic pain control. Discussed that this is a capsaicin patch, stronger than capsaicin cream. Discussed that it is currently approved for diabetic peripheral neuropathy and post-herpetic neuralgia, but that it has also shown benefit in treating other forms of neuropathy. Provided patient with link to site to learn more about the patch: [employer].fr. Discussed that the patch would be placed in office and benefits usually last 3 months. Discussed that unintended exposure to capsaicin can cause severe irritation of eyes, mucous membranes, respiratory tract, and skin, but that Qutenza is a local treatment and does not have the systemic side effects of other nerve medications. Discussed that there may be pain, itching, erythema, and decreased sensory function associated with the application of Qutenza. Side effects usually subside within 1 week. A cold pack of analgesic medications can help with these side effects. Blood pressure can also be increased due to pain associated with administration of the patch.  -discussed that we will try again for Qutenza next visit if this can be  covered fully for her -Discussed benefits of exercise in reducing pain. -Discussed following foods that may reduce pain: 1) Ginger (especially studied for arthritis)- reduce leukotriene production to decrease inflammation 2) Blueberries- high in phytonutrients that decrease inflammation 3) Salmon- marine omega-3s reduce joint swelling and pain 4) Pumpkin seeds- reduce inflammation 5) dark chocolate- reduces inflammation 6) turmeric- reduces inflammation 7) tart cherries - reduce pain and stiffness 8) extra virgin olive oil - its compound olecanthal helps to block prostaglandins  9) chili peppers- can be eaten or applied topically via capsaicin 10) mint- helpful for headache, muscle aches, joint pain, and itching 11) garlic- reduces inflammation  Link to further information on diet for chronic pain: http://www.randall.com/   2) Insomnia: -Try to go outside near sunrise -Get exercise during the day.  -Turn off all devices an hour before bedtime.  -Teas that can benefit: chamomile, valerian root, Brahmi (Bacopa) -Can consider over the counter melatonin, magnesium, and/or L-theanine. Melatonin is an anti-oxidant with multiple health benefits. Magnesium is involved in greater than 300 enzymatic reactions in the body and most of [place] are deficient as our soil is often depleted. There are 7 different types of magnesium- Bioptemizer's is a supplement with all 7 types, and each has unique benefits. Magnesium can also help with constipation and anxiety.  -Pistachios naturally increase the production of melatonin -Cozy Earth bamboo bed sheets are free from toxic chemicals.  -Tart cherry juice or a tart cherry supplement can improve sleep and soreness post-workout  5 minutes spent in discussing denial of tramadol, that patient called to find out the reason was that '100mg'$  tablets were requested, discussed trying Qutenza next visit

## 2022-02-22 NOTE — Telephone Encounter (Signed)
Sent patient a Therapist, music.

## 2022-03-14 ENCOUNTER — Encounter: Payer: Self-pay | Admitting: Family Medicine

## 2022-03-29 ENCOUNTER — Other Ambulatory Visit: Payer: Self-pay | Admitting: Family Medicine

## 2022-03-29 DIAGNOSIS — E1165 Type 2 diabetes mellitus with hyperglycemia: Secondary | ICD-10-CM

## 2022-03-30 ENCOUNTER — Other Ambulatory Visit: Payer: Self-pay | Admitting: Physical Medicine and Rehabilitation

## 2022-03-30 ENCOUNTER — Other Ambulatory Visit: Payer: Self-pay

## 2022-03-30 ENCOUNTER — Telehealth: Payer: Self-pay | Admitting: Family Medicine

## 2022-03-30 DIAGNOSIS — E1165 Type 2 diabetes mellitus with hyperglycemia: Secondary | ICD-10-CM

## 2022-03-30 MED ORDER — GABAPENTIN 300 MG PO CAPS: 300.0000 mg | ORAL_CAPSULE | ORAL | 1 refills | Status: DC

## 2022-03-30 NOTE — Telephone Encounter (Signed)
Called and LM to inform pt

## 2022-03-30 NOTE — Telephone Encounter (Signed)
Encourage patient to contact the pharmacy for refills or they can request refills through [facility]  (Please schedule appointment if patient has not been seen in over a year)  Last ov was on 11/04/21 WHAT PHARMACY WOULD THEY LIKE THIS SENT TO:  [place], [place] - [address]    MEDICATION NAME & DOSE: gabapentin (NEURONTIN) 300 MG capsule   NOTES/COMMENTS FROM PATIENT: Patient called for a refill on her medication. Patient hasn't had her medication in two days. Patient has up coming appt with Dr.Greene on 05/12/22.      [place] office please notify patient: It takes 48-72 hours to process rx refill requests Ask patient to call pharmacy to ensure rx is ready before heading there.

## 2022-04-10 ENCOUNTER — Telehealth: Payer: Self-pay

## 2022-04-10 ENCOUNTER — Ambulatory Visit (INDEPENDENT_AMBULATORY_CARE_PROVIDER_SITE_OTHER): Payer: BC Managed Care – PPO | Admitting: Family Medicine

## 2022-04-10 DIAGNOSIS — G6289 Other specified polyneuropathies: Secondary | ICD-10-CM

## 2022-04-10 DIAGNOSIS — Z23 Encounter for immunization: Secondary | ICD-10-CM

## 2022-04-10 MED ORDER — QUTENZA (4 PATCH) 8 % EX KIT: 4.0000 | PACK | Freq: Once | CUTANEOUS | 0 refills | Status: AC

## 2022-04-10 NOTE — Telephone Encounter (Signed)
Sent to specialty pharmacy to see if we can get covered under pharmacy benefits for patient

## 2022-04-11 ENCOUNTER — Telehealth: Payer: Self-pay | Admitting: Family Medicine

## 2022-04-11 NOTE — Telephone Encounter (Signed)
Disability Determination Services paperwork placed in front bin charge sheet attached.

## 2022-04-11 NOTE — Telephone Encounter (Signed)
I did not see these forms in bin

## 2022-04-12 NOTE — Telephone Encounter (Signed)
I did not receive this form in the bin either

## 2022-04-12 NOTE — Telephone Encounter (Signed)
Update:  Although, I take full responsibility for those forms not sure where they disappeared to but the pt no longer needs them she has had to return to work full time.

## 2022-04-12 NOTE — Telephone Encounter (Signed)
Noted thank you Vanessa

## 2022-04-17 NOTE — Progress Notes (Unsigned)
Pt was given second shingles shot. No concerns

## 2022-04-18 NOTE — Progress Notes (Signed)
Note reviewed, and agree with documentation and plan. Signed,   Merri Ray, MD [place] 04/18/22 2:54 PM

## 2022-04-19 ENCOUNTER — Ambulatory Visit: Payer: BC Managed Care – PPO | Admitting: Family Medicine

## 2022-04-19 ENCOUNTER — Encounter: Payer: Self-pay | Admitting: Family Medicine

## 2022-04-19 VITALS — BP 108/60 | HR 95 | Temp 98.2°F | Ht 66.0 in | Wt 262.4 lb

## 2022-04-19 DIAGNOSIS — R059 Cough, unspecified: Secondary | ICD-10-CM

## 2022-04-19 DIAGNOSIS — J019 Acute sinusitis, unspecified: Secondary | ICD-10-CM

## 2022-04-19 DIAGNOSIS — E1140 Type 2 diabetes mellitus with diabetic neuropathy, unspecified: Secondary | ICD-10-CM

## 2022-04-19 DIAGNOSIS — E1165 Type 2 diabetes mellitus with hyperglycemia: Secondary | ICD-10-CM

## 2022-04-19 DIAGNOSIS — F411 Generalized anxiety disorder: Secondary | ICD-10-CM

## 2022-04-19 DIAGNOSIS — S0031XA Abrasion of nose, initial encounter: Secondary | ICD-10-CM

## 2022-04-19 MED ORDER — DULOXETINE HCL 60 MG PO CPEP: 60.0000 mg | ORAL_CAPSULE | Freq: Every day | ORAL | 1 refills | Status: DC

## 2022-04-19 MED ORDER — MUPIROCIN 2 % EX OINT: 1.0000 | TOPICAL_OINTMENT | Freq: Two times a day (BID) | CUTANEOUS | 0 refills | Status: DC

## 2022-04-19 MED ORDER — GABAPENTIN 300 MG PO CAPS
300.0000 mg | ORAL_CAPSULE | ORAL | 1 refills | Status: DC
Start: 2022-04-19 — End: 2022-05-23

## 2022-04-19 MED ORDER — DOXYCYCLINE HYCLATE 100 MG PO TABS: 100.0000 mg | ORAL_TABLET | Freq: Two times a day (BID) | ORAL | 0 refills | Status: DC

## 2022-04-19 NOTE — Patient Instructions (Addendum)
I suspect your sinuses may be main thing contributing to cough, but please have x-ray performed to make sure we do not see pneumonia.  Start doxycycline twice per day, saline nasal spray throughout the day, drink plenty of fluids and see other information below on cough and sinus infection.  I do not hear any wheeze on your exam today.  Continue Trelegy, albuterol if needed, but if you require that more frequently or any increase in shortness of breath, be seen sooner.  Otherwise follow-up 1 week.  Teresa Castaneda for xray: Walk in 8:30-4:30 during weekdays, no appointment needed [place].  [place], [place] [address]  Bactroban ointment twice per day to the wound on the inside of the nose.  Saline nasal spray may also be helpful.  Gabapentin, Cymbalta refilled for now.  Recheck in 1 week.  Return to the clinic or go to the nearest emergency room if any of your symptoms worsen or new symptoms occur.  Sinus Infection, Adult A sinus infection, also called sinusitis, is inflammation of your sinuses. Sinuses are hollow spaces in the bones around your face. Your sinuses are located: Around your eyes. In the middle of your forehead. Behind your nose. In your cheekbones. Mucus normally drains out of your sinuses. When your nasal tissues become inflamed or swollen, mucus can become trapped or blocked. This allows bacteria, viruses, and fungi to grow, which leads to infection. Most infections of the sinuses are caused by a virus. A sinus infection can develop quickly. It can last for up to 4 weeks (acute) or for more than 12 weeks (chronic). A sinus infection often develops after a cold. What are the causes? This condition is caused by anything that creates swelling in the sinuses or stops mucus from draining. This includes: Allergies. Asthma. Infection from bacteria or viruses. Deformities or blockages in your nose or sinuses. Abnormal growths in the nose (nasal polyps). Pollutants, such as chemicals  or irritants in the air. Infection from fungi. This is rare. What increases the risk? You are more likely to develop this condition if you: Have a weak body defense system (immune system). Do a lot of swimming or diving. Overuse nasal sprays. Smoke. What are the signs or symptoms? The main symptoms of this condition are pain and a feeling of pressure around the affected sinuses. Other symptoms include: Stuffy nose or congestion that makes it difficult to breathe through your nose. Thick yellow or greenish drainage from your nose. Tenderness, swelling, and warmth over the affected sinuses. A cough that may get worse at night. Decreased sense of smell and taste. Extra mucus that collects in the throat or the back of the nose (postnasal drip) causing a sore throat or bad breath. Tiredness (fatigue). Fever. How is this diagnosed? This condition is diagnosed based on: Your symptoms. Your medical history. A physical exam. Tests to find out if your condition is acute or chronic. This may include: Checking your nose for nasal polyps. Viewing your sinuses using a device that has a light (endoscope). Testing for allergies or bacteria. Imaging tests, such as an MRI or CT scan. In rare cases, a bone biopsy may be done to rule out more serious types of fungal sinus disease. How is this treated? Treatment for a sinus infection depends on the cause and whether your condition is chronic or acute. If caused by a virus, your symptoms should go away on their own within 10 days. You may be given medicines to relieve symptoms. They include:  Medicines that shrink swollen nasal passages (decongestants). A spray that eases inflammation of the nostrils (topical intranasal corticosteroids). Rinses that help get rid of thick mucus in your nose (nasal saline washes). Medicines that treat allergies (antihistamines). Over-the-counter pain relievers. If caused by bacteria, your health care provider may  recommend waiting to see if your symptoms improve. Most bacterial infections will get better without antibiotic medicine. You may be given antibiotics if you have: A severe infection. A weak immune system. If caused by narrow nasal passages or nasal polyps, surgery may be needed. Follow these instructions at home: Medicines Take, use, or apply over-the-counter and prescription medicines only as told by your health care provider. These may include nasal sprays. If you were prescribed an antibiotic medicine, take it as told by your health care provider. Do not stop taking the antibiotic even if you start to feel better. Hydrate and humidify  Drink enough fluid to keep your urine pale yellow. Staying hydrated will help to thin your mucus. Use a cool mist humidifier to keep the humidity level in your home above 50%. Inhale steam for 10-15 minutes, 3-4 times a day, or as told by your health care provider. You can do this in the bathroom while a hot shower is running. Limit your exposure to cool or dry air. Rest Rest as much as possible. Sleep with your head raised (elevated). Make sure you get enough sleep each night. General instructions  Apply a warm, moist washcloth to your face 3-4 times a day or as told by your health care provider. This will help with discomfort. Use nasal saline washes as often as told by your health care provider. Wash your hands often with soap and water to reduce your exposure to germs. If soap and water are not available, use hand sanitizer. Do not smoke. Avoid being around people who are smoking (secondhand smoke). Keep all follow-up visits. This is important. Contact a health care provider if: You have a fever. Your symptoms get worse. Your symptoms do not improve within 10 days. Get help right away if: You have a severe headache. You have persistent vomiting. You have severe pain or swelling around your face or eyes. You have vision problems. You develop  confusion. Your neck is stiff. You have trouble breathing. These symptoms may be an emergency. Get help right away. Call 911. Do not wait to see if the symptoms will go away. Do not drive yourself to the hospital. Summary A sinus infection is soreness and inflammation of your sinuses. Sinuses are hollow spaces in the bones around your face. This condition is caused by nasal tissues that become inflamed or swollen. The swelling traps or blocks the flow of mucus. This allows bacteria, viruses, and fungi to grow, which leads to infection. If you were prescribed an antibiotic medicine, take it as told by your health care provider. Do not stop taking the antibiotic even if you start to feel better. Keep all follow-up visits. This is important. This information is not intended to replace advice given to you by your health care provider. Make sure you discuss any questions you have with your health care provider. Document Revised: 12/07/2020 Document Reviewed: 12/07/2020 Elsevier Patient Education  [place].   Cough, Adult Coughing is a reflex that clears your throat and airways (respiratory system). It helps heal and protect your lungs. It is normal to cough from time to time. A cough that happens with other symptoms or that lasts a long time may  be a sign of a condition that needs treatment. A short-term (acute) cough may only last 2-3 weeks. A long-term (chronic) cough may last 8 or more weeks. Coughing is often caused by: Diseases, such as: An infection of the respiratory system. Asthma or other heart or lung diseases. Gastroesophageal reflux. This is when acid comes back up from the stomach. Breathing in things that irritate your lungs. Allergies. Postnasal drip. This is when mucus runs down the back of your throat. Smoking. Some medicines. Follow these instructions at home: Medicines Take over-the-counter and prescription medicines only as told by your health care  provider. Talk with your provider before you take cough medicine (cough suppressants). Eating and drinking Do not drink alcohol. Avoid caffeine. Drink enough fluid to keep your pee (urine) pale yellow. Lifestyle Avoid cigarette smoke. Do not use any products that contain nicotine or tobacco. These products include cigarettes, chewing tobacco, and vaping devices, such as e-cigarettes. If you need help quitting, ask your provider. Avoid things that make you cough. These may include perfumes, candles, cleaning products, or campfire smoke. General instructions  Watch for any changes to your cough. Tell your provider about them. Always cover your mouth when you cough. If the air is dry in your bedroom or home, use a cool mist vaporizer or humidifier. If your cough is worse at night, try to sleep in a semi-upright position. Rest as needed. Contact a health care provider if: You have new symptoms, or your symptoms get worse. You cough up pus. You have a fever that does not go away or a cough that does not get better after 2-3 weeks. You cannot control your cough with medicine, and you are losing sleep. You have pain that gets worse or is not helped with medicine. You lose weight for no clear reason. You have night sweats. Get help right away if: You cough up blood. You have trouble breathing. Your heart is beating very fast. These symptoms may be an emergency. Get help right away. Call 911. Do not wait to see if the symptoms will go away. Do not drive yourself to the hospital. This information is not intended to replace advice given to you by your health care provider. Make sure you discuss any questions you have with your health care provider. Document Revised: 09/02/2021 Document Reviewed: 09/02/2021 Elsevier Patient Education  [place].

## 2022-04-19 NOTE — Progress Notes (Signed)
Subjective:  Patient ID: Teresa Castaneda, female    DOB: 05/19/1970  Age: 52 y.o. MRN: [id number]  CC:  Chief Complaint  Patient presents with   Cough    Pt states that she has had a cough for over three weeks and congestion and face pressure and mucus buildup for over a month.    Nasal Congestion    HPI Teresa Castaneda presents for   Acute visit for cough Cough, congestion Cough past 3-4 weeks. No fever. Productive - brown-green. More cough recently. Has been using trelegy - short of breath with walking. Using albuterol once per day - helps.  Multiple sick contacts at work past few weeks.  Sinus congestion past 2 months, more green nasal d/c past few weeks. Frontal and maxillary sinuses sore. Sore on inside R nostril, vaseline helped other side. R nostril wound is new.   Covid testing - none.  Tx: mucinex D.   She does have a history of asthma treated with Trelegy Ellipta, albuterol as needed, and history ofnocturnal hypoxemia.  Nasacort, Xyzal for allergies with Atrovent nasal spray as an option.  Pulmonary Dr. Erin Fulling, Visit in July with 1 year follow-up planned. Ran out of atrovent, using nasacort and saline nasal wash.   PCN allergic, allergic to biaxin, has tolerated doxycycline.   Anxiety/depression/insomnia Last discussed in October.  Adjustment component with health issues and chronic pain.  Cymbalta 120 mg daily.  Minimal relief with high-dose Lexapro in the past.  Hydroxyzine for sleep or anxiety flares with infrequent use.  Has declined therapist or psychiatry referral previously.stable depression. Hydroxyzine few times per month.    Foot neuropathy with diabetes Request gabapentin refill.  Thought to have multifactorial foot and leg pain, component of neuropathy due to diabetes as well as prior foot fracture.  On Cymbalta as above, gabapentin 300 mg 1 in the morning, 2 at noon, 3 at night when discussed last year, option of 4 at night.  Currently taking 1 in the morning,  2 in afternoon, 4 at night. Needs refill. Better with this dosing. No new side effects on this dosing.  Controlled substance database reviewed.  Gabapentin 300 mg #210 on 03/30/2022.  History Patient Active Problem List   Diagnosis Date Noted   Cirrhosis of liver 06/23/2020   Symptomatic cholelithiasis 05/10/2020   Acquired hypothyroidism 04/05/2020   Weight gain 04/05/2020   Type 2 diabetes mellitus with hyperglycemia, without long-term current use of insulin 04/05/2020   History of COVID-19 09/23/2019   Pneumonia due to COVID-19 virus 09/23/2019   Cough 09/23/2019   BMI 40.0-44.9, adult 07/22/2019   Facet arthropathy, lumbar 07/22/2019   Sacroiliitis 07/22/2019   Asthma 09/30/2016   ANXIETY 09/10/2006   DEPRESSION 09/10/2006   ASTHMA 09/10/2006   HEADACHE 09/10/2006   Past Medical History:  Diagnosis Date   Allergies    Anxiety    Arthritis    Asthma    Back pain    Spinal Issue    Diabetes mellitus without complication    DJD (degenerative joint disease), lumbar    Elevated cholesterol    Gallstone    GERD (gastroesophageal reflux disease)    Liver cirrhosis secondary to NASH    Neuromuscular disorder    Obesity    Oxygen deficiency    Thyroid disease    Past Surgical History:  Procedure Laterality Date   ABDOMINAL HYSTERECTOMY  02/21/2003   CHOLECYSTECTOMY N/A 05/10/2020   Procedure: LAPAROSCOPIC CHOLECYSTECTOMY WITH INTRAOPERATIVE CHOLANGIOGRAM AND LIVER BIOPSY;  Surgeon: Felicie Morn, MD;  Location: [facility];  Service: General;  Laterality: N/A;   DG SACROILIAC JOINTS ([place] HX)  01/11/2021   [facility]   ENDOSCOPIC TURBINATE REDUCTION Bilateral 10/13/2020   Procedure: ENDOSCOPIC BILATERAL INFERIOR TURBINATE REDUCTIONS;  Surgeon: Rozetta Nunnery, MD;  Location: [facility];  Service: ENT;  Laterality: Bilateral;   ETHMOIDECTOMY Bilateral 10/13/2020   Procedure: BILATERAL TOTAL ETHMOIDECTOMY AND MAXILLARY OSTIA ENLARGEMENTS;   Surgeon: Rozetta Nunnery, MD;  Location: [facility];  Service: ENT;  Laterality: Bilateral;   LIVER BIOPSY     NASAL SINUS SURGERY Bilateral 10/13/2020   Procedure: SINUS ENDOSCOPY WITH STEALTH NAVIGATION;  Surgeon: Rozetta Nunnery, MD;  Location: [facility];  Service: ENT;  Laterality: Bilateral;   TONSILLECTOMY     TOTAL ABDOMINAL HYSTERECTOMY     Allergies  Allergen Reactions   Clarithromycin Other (See Comments)    Body cramps    Codeine Nausea And Vomiting   Tylenol [Acetaminophen] Other (See Comments)    Can not take Tylenol products due to liver   Penicillins Rash   Prior to Admission medications   Medication Sig Start Date End Date Taking? Authorizing Provider  albuterol (VENTOLIN HFA) 108 (90 Base) MCG/ACT inhaler Inhale 2 puffs into the lungs every 6 (six) hours as needed for wheezing or shortness of breath. 08/08/21  Yes Dewald, Cheryle Horsfall, MD  amitriptyline (ELAVIL) 10 MG tablet Take 1 tablet (10 mg total) by mouth at bedtime. 02/16/22  Yes Raulkar, Clide Deutscher, MD  atorvastatin (LIPITOR) 10 MG tablet Take 1 tablet (10 mg total) by mouth daily. 10/18/20  Yes Wendie Agreste, MD  capsaicin topical system (QUTENZA, 4 PATCH,) 8 % Apply 4 patches topically once for 1 dose. Placed on affected area by provider every 3 months as needed for polyneuropathy 05/18/22 05/18/22 Yes Raulkar, Clide Deutscher, MD  DULoxetine (CYMBALTA) 60 MG capsule Take 1 capsule (60 mg total) by mouth daily. TAKE 2 CAPSULE BY MOUTH ONCE DAILY 09/15/21  Yes Wendie Agreste, MD  FARXIGA 10 MG TABS tablet Take 1 tablet (10 mg total) by mouth daily. 10/21/21  Yes Shamleffer, Melanie Crazier, MD  Fluticasone-Umeclidin-Vilant (TRELEGY ELLIPTA) 100-62.5-25 MCG/ACT AEPB Inhale 1 puff into the lungs daily. 08/08/21  Yes Freddi Starr, MD  furosemide (LASIX) 20 MG tablet Take 1 tablet (20 mg total) by mouth daily. 11/04/21  Yes Wendie Agreste, MD  gabapentin (NEURONTIN) 300 MG capsule  Take 1 capsule (300 mg total) by mouth as directed. 1 capsule in the morning and 2 pills at lunch and 4 pills at night 03/30/22  Yes Wendie Agreste, MD  glipiZIDE (GLUCOTROL) 5 MG tablet Take 1 tablet (5 mg total) by mouth 2 (two) times daily before a meal. 10/21/21  Yes Shamleffer, Melanie Crazier, MD  hydrOXYzine (ATARAX) 25 MG tablet Take 1 tablet 3 times a day by oral route as needed for 5 days. 08/04/16  Yes [provider]  ibuprofen (ADVIL) 600 MG tablet Take 1 tablet (600 mg total) by mouth 2 (two) times daily as needed. 04/15/21  Yes Draper, Carlos Levering, DO  levocetirizine (XYZAL) 5 MG tablet Take 1 tablet (5 mg total) by mouth every evening. 05/02/21  Yes Wendie Agreste, MD  levothyroxine (SYNTHROID) 150 MCG tablet Take 1 tablet (150 mcg total) by mouth daily. 10/21/21  Yes Shamleffer, Melanie Crazier, MD  lidocaine (LIDODERM) 5 % USE 1 PATCH EXTERNALLY ONCE DAILY REMOVE  AND  DISCARD  PATCH  WITH  12  HOURS  OR  AS  DIRECTED  BY  MD 08/11/20  Yes Wendie Agreste, MD  metFORMIN (GLUCOPHAGE) 850 MG tablet Take 1 tablet (850 mg total) by mouth 2 (two) times daily with a meal. 10/21/21  Yes Shamleffer, Melanie Crazier, MD  methylPREDNISolone acetate (DEPO-MEDROL) 80 MG/ML injection Take 80 mg every day by injection route for 1 day. 08/08/16  Yes [provider]  omeprazole (PRILOSEC) 40 MG capsule Take 1 capsule (40 mg total) by mouth daily. 11/04/21  Yes Wendie Agreste, MD  penicillin v potassium (VEETID) 500 MG tablet TAKE ONE TABLET BY MOUTH FOUR TIMES DAILY TILL GONE   Yes [provider]  rifaximin (XIFAXAN) 550 MG TABS tablet Take 1 tablet (550 mg total) by mouth 2 (two) times daily. 11/25/21  Yes Jackquline Denmark, MD  tirzepatide [facility]) 2.5 MG/0.5ML Pen Inject 2.5 mg into the skin once a week. 10/21/21  Yes Shamleffer, Melanie Crazier, MD  traMADol (ULTRAM) 50 MG tablet TAKE 2 TABLETS BY MOUTH EVERY 6 HOURS AS NEEDED 03/30/22  Yes Raulkar, Clide Deutscher, MD   triamcinolone (NASACORT) 55 MCG/ACT AERO nasal inhaler Place 2 sprays into the nose daily. 11/04/21  Yes Wendie Agreste, MD  azelastine (ASTELIN) 0.1 % nasal spray Place 2 sprays into both nostrils 2 (two) times daily. 04/23/19 12/26/19  Ok Edwards, PA-C   Social History   Socioeconomic History   Marital status: Single    Spouse name: Not on file   Number of children: Not on file   Years of education: Not on file   Highest education level: Not on file  Occupational History   Not on file  Tobacco Use   Smoking status: Some Days    Packs/day: .5    Types: Cigarettes    Start date: 01/16/1985    Last attempt to quit: 10/08/2019    Years since quitting: 2.5   Smokeless tobacco: Never   Tobacco comments:    7- 8 cig a week  Vaping Use   Vaping Use: Never used  Substance and Sexual Activity   Alcohol use: No   Drug use: No   Sexual activity: Not on file  Other Topics Concern   Not on file  Social History Narrative   Right Handed    Caffeine rare   Live with husband and mother in law in a two story hpme   Social Determinants of Health   Financial Resource Strain: Not on file  Food Insecurity: Not on file  Transportation Needs: Not on file  Physical Activity: Not on file  Stress: Not on file  Social Connections: Not on file  Intimate Partner Violence: Not on file    Review of Systems Per HPI.   Objective:   Vitals:   04/19/22 0836  BP: 108/60  Pulse: 95  Temp: 98.2 F (36.8 C)  TempSrc: Temporal  SpO2: 96%  Weight: 262 lb 6.4 oz (119 kg)  Height: 5\' 6"  (1.676 m)     Physical Exam Vitals reviewed.  Constitutional:      General: She is not in acute distress.    Appearance: She is well-developed.  HENT:     Head: Normocephalic and atraumatic.     Right Ear: Hearing, tympanic membrane, ear canal and external ear normal.     Left Ear: Hearing, tympanic membrane, ear canal and external ear normal.     Nose: Nose normal.     Comments: Frontal  and maxillary  sinus pressure/pain with percussion.  Nasal congestion bilaterally with small raw area just inside the ala on the right.  No nasal swelling.  Septum intact.    Mouth/Throat:     Pharynx: No oropharyngeal exudate or posterior oropharyngeal erythema.  Eyes:     Conjunctiva/sclera: Conjunctivae normal.     Pupils: Pupils are equal, round, and reactive to light.  Cardiovascular:     Rate and Rhythm: Normal rate and regular rhythm.     Heart sounds: Normal heart sounds. No murmur heard. Pulmonary:     Effort: Pulmonary effort is normal. No respiratory distress.     Breath sounds: Normal breath sounds. No wheezing or rhonchi.     Comments: Clear, no wheeze appreciated, speaking in full sentences.  No rhonchi appreciated. Skin:    General: Skin is warm and dry.     Findings: No rash.  Neurological:     Mental Status: She is alert and oriented to person, place, and time.  Psychiatric:        Mood and Affect: Mood normal.        Behavior: Behavior normal.     Assessment & Plan:  CHIZARAM BIGG is a 52 y.o. female . Acute sinusitis, recurrence not specified, unspecified location - Plan: doxycycline (VIBRA-TABS) 100 MG tablet Cough, unspecified type - Plan: DG Chest 2 View, doxycycline (VIBRA-TABS) 100 MG tablet  -Cough likely due to sinusitis, lungs overall clear, I do not hear any rhonchi or wheeze.  History of asthma with some dyspnea with exertion but clear on exam currently, infrequent use of albuterol once per day.  Start doxycycline given antibiotic allergies, check chest x-ray, recheck 1 week.  Continue albuterol if needed for now with RTC/ER precautions if increased use/need or worsening dyspnea.  Anxiety state - Plan: DULoxetine (CYMBALTA) 60 MG capsule  -Stable with Cymbalta 120 mg daily, continue same dosing.  Type 2 diabetes mellitus with hyperglycemia, without long-term current use of insulin - Plan: gabapentin (NEURONTIN) 300 MG capsule Type 2 diabetes mellitus with diabetic  neuropathy, without long-term current use of insulin  -Improved control of neuropathic symptoms with gabapentin, refilled.  Continue 1 in the morning, 2 in the afternoon, 4 at night.  Nasal abrasion, initial encounter - Plan: mupirocin ointment (BACTROBAN) 2 %  -Small right-sided abrasion likely due to recurrent congestion, blowing nose.  Continue saline nasal spray or nasal wash, application of Bactroban ointment twice per day for the next 5 days.  Recheck 1 week  Meds ordered this encounter  Medications   DULoxetine (CYMBALTA) 60 MG capsule    Sig: Take 1 capsule (60 mg total) by mouth daily. TAKE 2 CAPSULE BY MOUTH ONCE DAILY    Dispense:  90 capsule    Refill:  1   doxycycline (VIBRA-TABS) 100 MG tablet    Sig: Take 1 tablet (100 mg total) by mouth 2 (two) times daily.    Dispense:  20 tablet    Refill:  0   mupirocin ointment (BACTROBAN) 2 %    Sig: Apply 1 Application topically 2 (two) times daily. For 5 days - inside of nose/wound.    Dispense:  22 g    Refill:  0   gabapentin (NEURONTIN) 300 MG capsule    Sig: Take 1 capsule (300 mg total) by mouth as directed. 1 capsule in the morning and 2 pills at lunch and 4 pills at night    Dispense:  210 capsule    Refill:  1  Patient Instructions  I suspect your sinuses may be main thing contributing to cough, but please have x-ray performed to make sure we do not see pneumonia.  Start doxycycline twice per day, saline nasal spray throughout the day, drink plenty of fluids and see other information below on cough and sinus infection.  I do not hear any wheeze on your exam today.  Continue Trelegy, albuterol if needed, but if you require that more frequently or any increase in shortness of breath, be seen sooner.  Otherwise follow-up 1 week.  [facility] for xray: Walk in 8:30-4:30 during weekdays, no appointment needed [place].  [place], [place] [address]  Bactroban ointment twice per day to the wound on the inside of the nose.   Saline nasal spray may also be helpful.  Gabapentin, Cymbalta refilled for now.  Recheck in 1 week.  Return to the clinic or go to the nearest emergency room if any of your symptoms worsen or new symptoms occur.  Sinus Infection, Adult A sinus infection, also called sinusitis, is inflammation of your sinuses. Sinuses are hollow spaces in the bones around your face. Your sinuses are located: Around your eyes. In the middle of your forehead. Behind your nose. In your cheekbones. Mucus normally drains out of your sinuses. When your nasal tissues become inflamed or swollen, mucus can become trapped or blocked. This allows bacteria, viruses, and fungi to grow, which leads to infection. Most infections of the sinuses are caused by a virus. A sinus infection can develop quickly. It can last for up to 4 weeks (acute) or for more than 12 weeks (chronic). A sinus infection often develops after a cold. What are the causes? This condition is caused by anything that creates swelling in the sinuses or stops mucus from draining. This includes: Allergies. Asthma. Infection from bacteria or viruses. Deformities or blockages in your nose or sinuses. Abnormal growths in the nose (nasal polyps). Pollutants, such as chemicals or irritants in the air. Infection from fungi. This is rare. What increases the risk? You are more likely to develop this condition if you: Have a weak body defense system (immune system). Do a lot of swimming or diving. Overuse nasal sprays. Smoke. What are the signs or symptoms? The main symptoms of this condition are pain and a feeling of pressure around the affected sinuses. Other symptoms include: Stuffy nose or congestion that makes it difficult to breathe through your nose. Thick yellow or greenish drainage from your nose. Tenderness, swelling, and warmth over the affected sinuses. A cough that may get worse at night. Decreased sense of smell and taste. Extra mucus that  collects in the throat or the back of the nose (postnasal drip) causing a sore throat or bad breath. Tiredness (fatigue). Fever. How is this diagnosed? This condition is diagnosed based on: Your symptoms. Your medical history. A physical exam. Tests to find out if your condition is acute or chronic. This may include: Checking your nose for nasal polyps. Viewing your sinuses using a device that has a light (endoscope). Testing for allergies or bacteria. Imaging tests, such as an MRI or CT scan. In rare cases, a bone biopsy may be done to rule out more serious types of fungal sinus disease. How is this treated? Treatment for a sinus infection depends on the cause and whether your condition is chronic or acute. If caused by a virus, your symptoms should go away on their own within 10 days. You may be given medicines to  relieve symptoms. They include: Medicines that shrink swollen nasal passages (decongestants). A spray that eases inflammation of the nostrils (topical intranasal corticosteroids). Rinses that help get rid of thick mucus in your nose (nasal saline washes). Medicines that treat allergies (antihistamines). Over-the-counter pain relievers. If caused by bacteria, your health care provider may recommend waiting to see if your symptoms improve. Most bacterial infections will get better without antibiotic medicine. You may be given antibiotics if you have: A severe infection. A weak immune system. If caused by narrow nasal passages or nasal polyps, surgery may be needed. Follow these instructions at home: Medicines Take, use, or apply over-the-counter and prescription medicines only as told by your health care provider. These may include nasal sprays. If you were prescribed an antibiotic medicine, take it as told by your health care provider. Do not stop taking the antibiotic even if you start to feel better. Hydrate and humidify  Drink enough fluid to keep your urine pale yellow.  Staying hydrated will help to thin your mucus. Use a cool mist humidifier to keep the humidity level in your home above 50%. Inhale steam for 10-15 minutes, 3-4 times a day, or as told by your health care provider. You can do this in the bathroom while a hot shower is running. Limit your exposure to cool or dry air. Rest Rest as much as possible. Sleep with your head raised (elevated). Make sure you get enough sleep each night. General instructions  Apply a warm, moist washcloth to your face 3-4 times a day or as told by your health care provider. This will help with discomfort. Use nasal saline washes as often as told by your health care provider. Wash your hands often with soap and water to reduce your exposure to germs. If soap and water are not available, use hand sanitizer. Do not smoke. Avoid being around people who are smoking (secondhand smoke). Keep all follow-up visits. This is important. Contact a health care provider if: You have a fever. Your symptoms get worse. Your symptoms do not improve within 10 days. Get help right away if: You have a severe headache. You have persistent vomiting. You have severe pain or swelling around your face or eyes. You have vision problems. You develop confusion. Your neck is stiff. You have trouble breathing. These symptoms may be an emergency. Get help right away. Call 911. Do not wait to see if the symptoms will go away. Do not drive yourself to the hospital. Summary A sinus infection is soreness and inflammation of your sinuses. Sinuses are hollow spaces in the bones around your face. This condition is caused by nasal tissues that become inflamed or swollen. The swelling traps or blocks the flow of mucus. This allows bacteria, viruses, and fungi to grow, which leads to infection. If you were prescribed an antibiotic medicine, take it as told by your health care provider. Do not stop taking the antibiotic even if you start to feel  better. Keep all follow-up visits. This is important. This information is not intended to replace advice given to you by your health care provider. Make sure you discuss any questions you have with your health care provider. Document Revised: 12/07/2020 Document Reviewed: 12/07/2020 Elsevier Patient Education  [place].   Cough, Adult Coughing is a reflex that clears your throat and airways (respiratory system). It helps heal and protect your lungs. It is normal to cough from time to time. A cough that happens with other symptoms or that lasts  a long time may be a sign of a condition that needs treatment. A short-term (acute) cough may only last 2-3 weeks. A long-term (chronic) cough may last 8 or more weeks. Coughing is often caused by: Diseases, such as: An infection of the respiratory system. Asthma or other heart or lung diseases. Gastroesophageal reflux. This is when acid comes back up from the stomach. Breathing in things that irritate your lungs. Allergies. Postnasal drip. This is when mucus runs down the back of your throat. Smoking. Some medicines. Follow these instructions at home: Medicines Take over-the-counter and prescription medicines only as told by your health care provider. Talk with your provider before you take cough medicine (cough suppressants). Eating and drinking Do not drink alcohol. Avoid caffeine. Drink enough fluid to keep your pee (urine) pale yellow. Lifestyle Avoid cigarette smoke. Do not use any products that contain nicotine or tobacco. These products include cigarettes, chewing tobacco, and vaping devices, such as e-cigarettes. If you need help quitting, ask your provider. Avoid things that make you cough. These may include perfumes, candles, cleaning products, or campfire smoke. General instructions  Watch for any changes to your cough. Tell your provider about them. Always cover your mouth when you cough. If the air is dry in your  bedroom or home, use a cool mist vaporizer or humidifier. If your cough is worse at night, try to sleep in a semi-upright position. Rest as needed. Contact a health care provider if: You have new symptoms, or your symptoms get worse. You cough up pus. You have a fever that does not go away or a cough that does not get better after 2-3 weeks. You cannot control your cough with medicine, and you are losing sleep. You have pain that gets worse or is not helped with medicine. You lose weight for no clear reason. You have night sweats. Get help right away if: You cough up blood. You have trouble breathing. Your heart is beating very fast. These symptoms may be an emergency. Get help right away. Call 911. Do not wait to see if the symptoms will go away. Do not drive yourself to the hospital. This information is not intended to replace advice given to you by your health care provider. Make sure you discuss any questions you have with your health care provider. Document Revised: 09/02/2021 Document Reviewed: 09/02/2021 Elsevier Patient Education  [place],   Merri Ray, MD [place], [facility] 04/19/22 9:13 AM

## 2022-04-21 ENCOUNTER — Ambulatory Visit (INDEPENDENT_AMBULATORY_CARE_PROVIDER_SITE_OTHER)
Admission: RE | Admit: 2022-04-21 | Discharge: 2022-04-21 | Disposition: A | Discharge status: Home / Self Care | Payer: BC Managed Care – PPO | Source: Ambulatory Visit | Attending: Family Medicine | Admitting: Family Medicine

## 2022-04-21 DIAGNOSIS — R0602 Shortness of breath: Secondary | ICD-10-CM | POA: Diagnosis not present

## 2022-04-21 DIAGNOSIS — R059 Cough, unspecified: Secondary | ICD-10-CM

## 2022-04-24 ENCOUNTER — Ambulatory Visit: Payer: BC Managed Care – PPO | Admitting: Internal Medicine

## 2022-04-24 ENCOUNTER — Encounter: Payer: Self-pay | Admitting: Internal Medicine

## 2022-04-24 VITALS — BP 122/70 | HR 100 | Ht 66.0 in | Wt 260.0 lb

## 2022-04-24 DIAGNOSIS — E039 Hypothyroidism, unspecified: Secondary | ICD-10-CM

## 2022-04-24 DIAGNOSIS — E1140 Type 2 diabetes mellitus with diabetic neuropathy, unspecified: Secondary | ICD-10-CM | POA: Diagnosis not present

## 2022-04-24 LAB — POCT GLYCOSYLATED HEMOGLOBIN (HGB A1C): Hemoglobin A1C: 6.6 % — AB (ref 4.0–5.6)

## 2022-04-24 LAB — POCT GLUCOSE (DEVICE FOR HOME USE): POC Glucose: 177 mg/dl — AB (ref 70–99)

## 2022-04-24 MED ORDER — FARXIGA 10 MG PO TABS: 10.0000 mg | ORAL_TABLET | Freq: Every day | ORAL | 3 refills | Status: DC

## 2022-04-24 MED ORDER — GLIPIZIDE 5 MG PO TABS: 5.0000 mg | ORAL_TABLET | Freq: Every day | ORAL | 3 refills | Status: DC

## 2022-04-24 MED ORDER — LEVOTHYROXINE SODIUM 150 MCG PO TABS: 150.0000 ug | ORAL_TABLET | Freq: Every day | ORAL | 3 refills | Status: DC

## 2022-04-24 MED ORDER — TIRZEPATIDE 5 MG/0.5ML ~~LOC~~ SOAJ: 5.0000 mg | SUBCUTANEOUS | 3 refills | Status: DC

## 2022-04-24 MED ORDER — METFORMIN HCL 850 MG PO TABS
850.0000 mg | ORAL_TABLET | Freq: Two times a day (BID) | ORAL | 3 refills | Status: DC
Start: 2022-04-24 — End: 2022-10-24

## 2022-04-24 NOTE — Progress Notes (Signed)
Name: Teresa Castaneda  Age/ Sex: 52 y.o., female   MRN/ DOB: [id number], 11-20-1970     PCP: Shade Flood, MD   Reason for Endocrinology Evaluation: Type 2 Diabetes Mellitus  Initial Endocrine Consultative Visit: 04/05/2020    PATIENT IDENTIFIER: [name] is a 52 y.o. female with a past medical history of T2DM, cirrhosis  and Hypothyroidism. The patient has followed with Endocrinology clinic since 04/05/2020 for consultative assistance with management of her diabetes.  DIABETIC HISTORY:  Ms. Sharpe was diagnosed with DM in 08/2019, Metformin started 08/2019. Her hemoglobin A1c has ranged from 8.1% in 2022, peaking at 9.8% in 2022. Has mild upset stomach since increasing metformin but its transient    Glipizide started 02/2021    THYROID HISTORY: She has been diagnosed with hypothyroidism ~ 5 yrs ago No prior sx or radiation to the neck She was on Levothyroxine in he past but that didn't  work   Pt is on Armour thyroid 180 mg daily and 15 mg daily , we stopped 50 mg of Armour Thyroid which normalized her TSH.  Switched from armour to levothyroxine 04/2021 due to cost   Mounjaro started 10/2021  SUBJECTIVE:   During the last visit (10/21/2021): A1c 7.4 %      Today (04/24/2022): [name]  is here for a follow up on diabetes and hypothyroid management. She checks her blood sugars 3 x daily  . The patient has not had hypoglycemic episodes since the last clinic visit but endorses subjective hypoglycemia with Bg's 100s around 1 pm    She follows with neurology  for neuropathy She is tolerating mounjaro  Weight fluctuates  Denies recent nausea, vomiting or diarrhea   She has been working 3rd shift    HOME ENDOCRINE  REGIMEN:  Metformin 850 mg BID  Farxiga 10 mg daily  Glipizide 5 mg, 1 tablet twice daily Mounjaro 2.5 mg weekly Levothyroxine 150 mg daily       Statin: Yes ACE-I/ARB: No Prior Diabetic Education: No   METER DOWNLOAD SUMMARY:  n/a   DIABETIC COMPLICATIONS: Microvascular complications:  Neuropathy Denies: CKD, retinopathy Last Eye Exam: Completed 03/2020  Macrovascular complications:   Denies: CAD, CVA, PVD   HISTORY:  Past Medical History:  Past Medical History:  Diagnosis Date   Allergies    Anxiety    Arthritis    Asthma    Back pain    Spinal Issue    Diabetes mellitus without complication    DJD (degenerative joint disease), lumbar    Elevated cholesterol    Gallstone    GERD (gastroesophageal reflux disease)    Liver cirrhosis secondary to NASH    Neuromuscular disorder    Obesity    Oxygen deficiency    Thyroid disease    Past Surgical History:  Past Surgical History:  Procedure Laterality Date   ABDOMINAL HYSTERECTOMY  02/21/2003   CHOLECYSTECTOMY N/A 05/10/2020   Procedure: LAPAROSCOPIC CHOLECYSTECTOMY WITH INTRAOPERATIVE CHOLANGIOGRAM AND LIVER BIOPSY;  Surgeon: Quentin Ore, MD;  Location: [facility];  Service: General;  Laterality: N/A;   DG SACROILIAC JOINTS ([facility] HX)  01/11/2021   [facility]   ENDOSCOPIC TURBINATE REDUCTION Bilateral 10/13/2020   Procedure: ENDOSCOPIC BILATERAL INFERIOR TURBINATE REDUCTIONS;  Surgeon: Drema Halon, MD;  Location: [facility];  Service: ENT;  Laterality: Bilateral;   ETHMOIDECTOMY Bilateral 10/13/2020   Procedure: BILATERAL TOTAL ETHMOIDECTOMY AND MAXILLARY OSTIA ENLARGEMENTS;  Surgeon: Drema Halon, MD;  Location: [facility];  Service: ENT;  Laterality: Bilateral;   LIVER BIOPSY     NASAL SINUS SURGERY Bilateral 10/13/2020   Procedure: SINUS ENDOSCOPY WITH STEALTH NAVIGATION;  Surgeon: Drema HalonNewman, Christopher E, MD;  Location: [facility];  Service: ENT;  Laterality: Bilateral;   TONSILLECTOMY     TOTAL ABDOMINAL HYSTERECTOMY     Social History:  reports that she has been smoking cigarettes. She started smoking about 37 years ago. She has been smoking an average of .5  packs per day. She has never used smokeless tobacco. She reports that she does not drink alcohol and does not use drugs. Family History:  Family History  Problem Relation Age of Onset   Coronary artery disease Mother    Diabetes Mother    COPD Mother    Coronary artery disease Father    Hypertension Father    Colon cancer Neg Hx    Esophageal cancer Neg Hx      HOME MEDICATIONS: Allergies as of 04/24/2022       Reactions   Clarithromycin Other (See Comments)   Body cramps    Codeine Nausea And Vomiting   Tylenol [acetaminophen] Other (See Comments)   Can not take Tylenol products due to liver   Penicillins Rash        Medication List        Accurate as of April 24, 2022  8:05 AM. If you have any questions, ask your nurse or doctor.          STOP taking these medications    tirzepatide 2.5 MG/0.5ML Pen Commonly known as: MOUNJARO Stopped by: Scarlette ShortsIbtehal J Maliek Schellhorn, MD       TAKE these medications    albuterol 108 (90 Base) MCG/ACT inhaler Commonly known as: VENTOLIN HFA Inhale 2 puffs into the lungs every 6 (six) hours as needed for wheezing or shortness of breath.   amitriptyline 10 MG tablet Commonly known as: ELAVIL Take 1 tablet (10 mg total) by mouth at bedtime.   atorvastatin 10 MG tablet Commonly known as: LIPITOR Take 1 tablet (10 mg total) by mouth daily.   DEPO-Medrol 80 MG/ML injection Generic drug: methylPREDNISolone acetate Take 80 mg every day by injection route for 1 day.   doxycycline 100 MG tablet Commonly known as: VIBRA-TABS Take 1 tablet (100 mg total) by mouth 2 (two) times daily.   DULoxetine 60 MG capsule Commonly known as: CYMBALTA Take 1 capsule (60 mg total) by mouth daily. TAKE 2 CAPSULE BY MOUTH ONCE DAILY   Farxiga 10 MG Tabs tablet Generic drug: dapagliflozin propanediol Take 1 tablet (10 mg total) by mouth daily.   furosemide 20 MG tablet Commonly known as: LASIX Take 1 tablet (20 mg total) by mouth daily.    gabapentin 300 MG capsule Commonly known as: NEURONTIN Take 1 capsule (300 mg total) by mouth as directed. 1 capsule in the morning and 2 pills at lunch and 4 pills at night   glipiZIDE 5 MG tablet Commonly known as: GLUCOTROL Take 1 tablet (5 mg total) by mouth 2 (two) times daily before a meal.   hydrOXYzine 25 MG tablet Commonly known as: ATARAX Take 1 tablet 3 times a day by oral route as needed for 5 days.   ibuprofen 600 MG tablet Commonly known as: ADVIL Take 1 tablet (600 mg total) by mouth 2 (two) times daily as needed.   levocetirizine 5 MG tablet Commonly known as: XYZAL Take 1 tablet (5 mg total) by mouth every  evening.   levothyroxine 150 MCG tablet Commonly known as: SYNTHROID Take 1 tablet (150 mcg total) by mouth daily.   lidocaine 5 % Commonly known as: LIDODERM USE 1 PATCH EXTERNALLY ONCE DAILY REMOVE  AND  DISCARD  PATCH  WITH  12  HOURS  OR  AS  DIRECTED  BY  MD   metFORMIN 850 MG tablet Commonly known as: GLUCOPHAGE Take 1 tablet (850 mg total) by mouth 2 (two) times daily with a meal.   mupirocin ointment 2 % Commonly known as: BACTROBAN Apply 1 Application topically 2 (two) times daily. For 5 days - inside of nose/wound.   omeprazole 40 MG capsule Commonly known as: PRILOSEC Take 1 capsule (40 mg total) by mouth daily.   penicillin v potassium 500 MG tablet Commonly known as: VEETID TAKE ONE TABLET BY MOUTH FOUR TIMES DAILY TILL GONE   Qutenza (4 Patch) 8 % Generic drug: capsaicin topical system Apply 4 patches topically once for 1 dose. Placed on affected area by provider every 3 months as needed for polyneuropathy Start taking on: May 18, 2022   rifaximin 550 MG Tabs tablet Commonly known as: Xifaxan Take 1 tablet (550 mg total) by mouth 2 (two) times daily.   traMADol 50 MG tablet Commonly known as: ULTRAM TAKE 2 TABLETS BY MOUTH EVERY 6 HOURS AS NEEDED   Trelegy Ellipta 100-62.5-25 MCG/ACT Aepb Generic drug:  Fluticasone-Umeclidin-Vilant Inhale 1 puff into the lungs daily.   triamcinolone 55 MCG/ACT Aero nasal inhaler Commonly known as: NASACORT Place 2 sprays into the nose daily.         OBJECTIVE:   Vital Signs: BP 122/70 (BP Location: Left Arm, Patient Position: Sitting, Cuff Size: Large)   Pulse 100   Ht 5\' 6"  (1.676 m)   Wt 260 lb (117.9 kg)   SpO2 98%   BMI 41.97 kg/m   Wt Readings from Last 3 Encounters:  04/24/22 260 lb (117.9 kg)  04/19/22 262 lb 6.4 oz (119 kg)  02/16/22 251 lb 9.6 oz (114.1 kg)     Exam: General: Pt appears well and is in NAD  Neck: General: Supple without adenopathy. Thyroid:  No goiter or nodules appreciated.   Lungs: Clear with good BS bilat with no rales, rhonchi, or wheezes  Heart: RRR   Extremities: Trace edema   Neuro: MS is good with appropriate affect, pt is alert and Ox3   DM Foot Exam 04/24/2022 The skin of the feet is intact without sores or ulcerations. The pedal pulses are 2+ on right and 2+ on left. The sensation is decreased  to a screening 5.07, 10 gram monofilament bilaterally   DATA REVIEWED:   Lab Results  Component Value Date   HGBA1C 6.6 (A) 04/24/2022   HGBA1C 7.4 (A) 10/21/2021   HGBA1C 9.1 (H) 08/30/2021     Latest Reference Range & Units 08/30/21 10:06  Sodium 135 - 145 mEq/L 138  Potassium 3.5 - 5.1 mEq/L 3.9  Chloride 96 - 112 mEq/L 103  CO2 19 - 32 mEq/L 27  Glucose 70 - 99 mg/dL 646 (H)  BUN 6 - 23 mg/dL 6  Creatinine 8.03 - 2.12 mg/dL 2.48  Calcium 8.4 - 25.0 mg/dL 9.0  Alkaline Phosphatase 39 - 117 U/L 81  Albumin 3.5 - 5.2 g/dL 3.5  AST 0 - 37 U/L 39 (H)  ALT 0 - 35 U/L 29  Total Protein 6.0 - 8.3 g/dL 7.3  Total Bilirubin 0.2 - 1.2 mg/dL 0.7  GFR >03.70  mL/min 110.41    Latest Reference Range & Units 10/21/21 08:43  TSH 0.35 - 5.50 uIU/mL 1.80  T4,Free(Direct) 0.60 - 1.60 ng/dL 3.12    In office BG 811 mg/dL   ASSESSMENT / PLAN / RECOMMENDATIONS:   1) Type 2 Diabetes Mellitus,  Optimally  With Neuropathic complications - Most recent A1c of 6.6 %. Goal A1c < 7.0 %.     - Praised the pt on improved glycemic control  - She is tolerating Mounjaro, will increase as below  - Will decrease Glipizide, preemptively to prevent hypoglycemia  -Intolerant to higher doses of Metformin   MEDICATIONS: Continue Metformin 850 mg, 1 tablet with Breakfast and Supper  Continue Farxiga 10  mg daily  Decrease  Glipizide 5 mg , 1 tablet before Breakfast  Increase Mounjaro  mg weekly   EDUCATION / INSTRUCTIONS: BG monitoring instructions: Patient is instructed to check her blood sugars 1 times a day, fasting. Call [facility] if: BG persistently < 70  I reviewed the Rule of 15 for the treatment of hypoglycemia in detail with the patient. Literature supplied.    2) Diabetic complications:  Eye: Does not have known diabetic retinopathy.  Neuro/ Feet: Does not have known diabetic peripheral neuropathy .  Renal: Patient does not have known baseline CKD. She   is not on an ACEI/ARB at present.    3) Hypothyroidism:   - Pt is clinically euthyroid  -Tsh has been normal   Medication  Continue levothyroxine 150 mg daily   F/U in 6 months   Signed electronically by: Lyndle Herrlich, MD  [facility]  [facility] [address] [place] [place], [place] [address] Phone: [phone] FAX: [phone]   CC: Shade Flood, MD [address] [place] [address] Phone: [phone]  Fax: [phone]  Return to Endocrinology clinic as below: Future Appointments  Date Time Provider Department Center  04/28/2022  9:40 AM Shade Flood, MD [facility]  05/12/2022  8:00 AM Shade Flood, MD [facility]  05/18/2022  9:45 AM Carlis Abbott, Drema Pry, MD [facility]  06/14/2022  3:00 PM Lynann Bologna, MD [facility]

## 2022-04-24 NOTE — Patient Instructions (Signed)
-   Keep Up the Good Work ! A1c 6.6%  - Continue Metformin 850 mg, 1 tablet with Breakfast and Supper  - Continue Farxiga 10 mg daily  - Decrease  Glipizide 5 mg,  1 tablet before breakfast  - Increase Mounjaro 5 mg weekly      HOW TO TREAT LOW BLOOD SUGARS (Blood sugar LESS THAN 70 MG/DL) Please follow the RULE OF 15 for the treatment of hypoglycemia treatment (when your (blood sugars are less than 70 mg/dL)   STEP 1: Take 15 grams of carbohydrates when your blood sugar is low, which includes:  3-4 GLUCOSE TABS  OR 3-4 OZ OF JUICE OR REGULAR SODA OR ONE TUBE OF GLUCOSE GEL    STEP 2: RECHECK blood sugar in 15 MINUTES STEP 3: If your blood sugar is still low at the 15 minute recheck --> then, go back to STEP 1 and treat AGAIN with another 15 grams of carbohydrates.

## 2022-04-28 ENCOUNTER — Other Ambulatory Visit: Payer: Self-pay | Admitting: Physical Medicine and Rehabilitation

## 2022-04-28 ENCOUNTER — Other Ambulatory Visit: Payer: Self-pay | Admitting: Family Medicine

## 2022-04-28 ENCOUNTER — Ambulatory Visit: Payer: BC Managed Care – PPO | Admitting: Family Medicine

## 2022-04-28 ENCOUNTER — Telehealth: Payer: Self-pay

## 2022-04-28 ENCOUNTER — Encounter: Payer: Self-pay | Admitting: Family Medicine

## 2022-04-28 VITALS — BP 110/58 | HR 104 | Temp 99.2°F | Ht 66.0 in | Wt 257.4 lb

## 2022-04-28 DIAGNOSIS — R0981 Nasal congestion: Secondary | ICD-10-CM | POA: Diagnosis not present

## 2022-04-28 DIAGNOSIS — J4520 Mild intermittent asthma, uncomplicated: Secondary | ICD-10-CM | POA: Diagnosis not present

## 2022-04-28 DIAGNOSIS — J309 Allergic rhinitis, unspecified: Secondary | ICD-10-CM

## 2022-04-28 DIAGNOSIS — F5104 Psychophysiologic insomnia: Secondary | ICD-10-CM

## 2022-04-28 DIAGNOSIS — R059 Cough, unspecified: Secondary | ICD-10-CM

## 2022-04-28 DIAGNOSIS — Z09 Encounter for follow-up examination after completed treatment for conditions other than malignant neoplasm: Secondary | ICD-10-CM

## 2022-04-28 DIAGNOSIS — R600 Localized edema: Secondary | ICD-10-CM

## 2022-04-28 MED ORDER — PREDNISONE 20 MG PO TABS
40.0000 mg | ORAL_TABLET | Freq: Every day | ORAL | 0 refills | Status: DC
Start: 2022-04-28 — End: 2022-06-14

## 2022-04-28 MED ORDER — HYDROXYZINE HCL 25 MG PO TABS
ORAL_TABLET | ORAL | 1 refills | Status: DC
Start: 2022-04-28 — End: 2022-08-18

## 2022-04-28 NOTE — Progress Notes (Signed)
Subjective:  Patient ID: Teresa Castaneda, female    DOB: 1970/08/11  Age: 52 y.o. MRN: [id number]  CC:  Chief Complaint  Patient presents with   Cough   Nasal Congestion    Pt is here today to F/U on cough (wet) and nasal congestion. No OTC meds.     HPI Teresa Castaneda presents for   Cough, nasal congestion Follow-up from April 3, started on doxycycline for probable sinusitis with cough likely due to the postnasal drip/congestion from sinusitis.  No rhonchi or wheeze on exam, history of asthma with dyspnea exertion but she was clear on exam and infrequent use of albuterol.  Chest x-ray obtained on April 5 with mild bilateral interstitial thickening unchanged from prior and appears chronic, no acute airspace opacity.  Mupirocin was also ordered for irritation on nasal passages. Has been taking doxycycline - some improvement. Feels like allergies are main issue.  No fever. No chest sx's. Nasal congestion and PND. Using bactoban - irritation improved.  Tx: saline ns BID. Xyzal 5mg  qd. Nasacort daily.   Suicide in family recently - 26yo nephew committed suicide 2 days ago. Trouble sleeping. Has not tried anything otc. No current therapist. Worried about mother in law - she is having trouble with loss of grandson. Declines need for counseling or resources for herself currently. Hydroxyzine has been used in past - did ok. Out of it.       History Patient Active Problem List   Diagnosis Date Noted   Cirrhosis of liver 06/23/2020   Symptomatic cholelithiasis 05/10/2020   Acquired hypothyroidism 04/05/2020   Weight gain 04/05/2020   Type 2 diabetes mellitus with hyperglycemia, without long-term current use of insulin 04/05/2020   History of COVID-19 09/23/2019   Pneumonia due to COVID-19 virus 09/23/2019   Cough 09/23/2019   BMI 40.0-44.9, adult 07/22/2019   Facet arthropathy, lumbar 07/22/2019   Sacroiliitis 07/22/2019   Asthma 09/30/2016   ANXIETY 09/10/2006   DEPRESSION  09/10/2006   ASTHMA 09/10/2006   HEADACHE 09/10/2006   Past Medical History:  Diagnosis Date   Allergies    Anxiety    Arthritis    Asthma    Back pain    Spinal Issue    Diabetes mellitus without complication    DJD (degenerative joint disease), lumbar    Elevated cholesterol    Gallstone    GERD (gastroesophageal reflux disease)    Liver cirrhosis secondary to NASH    Neuromuscular disorder    Obesity    Oxygen deficiency    Thyroid disease    Past Surgical History:  Procedure Laterality Date   ABDOMINAL HYSTERECTOMY  02/21/2003   CHOLECYSTECTOMY N/A 05/10/2020   Procedure: LAPAROSCOPIC CHOLECYSTECTOMY WITH INTRAOPERATIVE CHOLANGIOGRAM AND LIVER BIOPSY;  Surgeon: Quentin Ore, MD;  Location: [facility];  Service: General;  Laterality: N/A;   DG SACROILIAC JOINTS ([facility] HX)  01/11/2021   [facility]   ENDOSCOPIC TURBINATE REDUCTION Bilateral 10/13/2020   Procedure: ENDOSCOPIC BILATERAL INFERIOR TURBINATE REDUCTIONS;  Surgeon: Drema Halon, MD;  Location: [facility];  Service: ENT;  Laterality: Bilateral;   ETHMOIDECTOMY Bilateral 10/13/2020   Procedure: BILATERAL TOTAL ETHMOIDECTOMY AND MAXILLARY OSTIA ENLARGEMENTS;  Surgeon: Drema Halon, MD;  Location: [facility];  Service: ENT;  Laterality: Bilateral;   LIVER BIOPSY     NASAL SINUS SURGERY Bilateral 10/13/2020   Procedure: SINUS ENDOSCOPY WITH STEALTH NAVIGATION;  Surgeon: Drema Halon, MD;  Location: [facility];  Service: ENT;  Laterality: Bilateral;   TONSILLECTOMY     TOTAL ABDOMINAL HYSTERECTOMY     Allergies  Allergen Reactions   Clarithromycin Other (See Comments)    Body cramps    Codeine Nausea And Vomiting   Tylenol [Acetaminophen] Other (See Comments)    Can not take Tylenol products due to liver   Penicillins Rash   Prior to Admission medications   Medication Sig Start Date End Date Taking? Authorizing Provider   albuterol (VENTOLIN HFA) 108 (90 Base) MCG/ACT inhaler Inhale 2 puffs into the lungs every 6 (six) hours as needed for wheezing or shortness of breath. 08/08/21  Yes Dewald, Bettina Gavia, MD  amitriptyline (ELAVIL) 10 MG tablet Take 1 tablet (10 mg total) by mouth at bedtime. 02/16/22  Yes Raulkar, Drema Pry, MD  atorvastatin (LIPITOR) 10 MG tablet Take 1 tablet (10 mg total) by mouth daily. 10/18/20  Yes Shade Flood, MD  capsaicin topical system (QUTENZA, 4 PATCH,) 8 % Apply 4 patches topically once for 1 dose. Placed on affected area by provider every 3 months as needed for polyneuropathy 05/18/22 05/18/22 Yes Raulkar, Drema Pry, MD  doxycycline (VIBRA-TABS) 100 MG tablet Take 1 tablet (100 mg total) by mouth 2 (two) times daily. 04/19/22  Yes Shade Flood, MD  DULoxetine (CYMBALTA) 60 MG capsule Take 1 capsule (60 mg total) by mouth daily. TAKE 2 CAPSULE BY MOUTH ONCE DAILY 04/19/22  Yes Shade Flood, MD  FARXIGA 10 MG TABS tablet Take 1 tablet (10 mg total) by mouth daily. 04/24/22  Yes Shamleffer, Konrad Dolores, MD  Fluticasone-Umeclidin-Vilant (TRELEGY ELLIPTA) 100-62.5-25 MCG/ACT AEPB Inhale 1 puff into the lungs daily. 08/08/21  Yes Martina Sinner, MD  gabapentin (NEURONTIN) 300 MG capsule Take 1 capsule (300 mg total) by mouth as directed. 1 capsule in the morning and 2 pills at lunch and 4 pills at night 04/19/22  Yes Shade Flood, MD  glipiZIDE (GLUCOTROL) 5 MG tablet Take 1 tablet (5 mg total) by mouth daily before breakfast. 04/24/22  Yes Shamleffer, Konrad Dolores, MD  hydrOXYzine (ATARAX) 25 MG tablet Take 1 tablet 3 times a day by oral route as needed for 5 days. 08/04/16  Yes [provider]  ibuprofen (ADVIL) 600 MG tablet Take 1 tablet (600 mg total) by mouth 2 (two) times daily as needed. 04/15/21  Yes Draper, Ozzie Hoyle, DO  levocetirizine (XYZAL) 5 MG tablet Take 1 tablet (5 mg total) by mouth every evening. 05/02/21  Yes Shade Flood, MD  levothyroxine  (SYNTHROID) 150 MCG tablet Take 1 tablet (150 mcg total) by mouth daily. 04/24/22  Yes Shamleffer, Konrad Dolores, MD  lidocaine (LIDODERM) 5 % USE 1 PATCH EXTERNALLY ONCE DAILY REMOVE  AND  DISCARD  PATCH  WITH  12  HOURS  OR  AS  DIRECTED  BY  MD 08/11/20  Yes Shade Flood, MD  metFORMIN (GLUCOPHAGE) 850 MG tablet Take 1 tablet (850 mg total) by mouth 2 (two) times daily with a meal. 04/24/22  Yes Shamleffer, Konrad Dolores, MD  mupirocin ointment (BACTROBAN) 2 % Apply 1 Application topically 2 (two) times daily. For 5 days - inside of nose/wound. 04/19/22  Yes Shade Flood, MD  omeprazole (PRILOSEC) 40 MG capsule Take 1 capsule (40 mg total) by mouth daily. 11/04/21  Yes Shade Flood, MD  rifaximin (XIFAXAN) 550 MG TABS tablet Take 1 tablet (550 mg total) by mouth 2 (two) times daily. 11/25/21  Yes Lynann Bologna, MD  tirzepatide [facility]) 5 MG/0.5ML Pen Inject 5 mg into the skin once a week. 04/24/22  Yes Shamleffer, Konrad Dolores, MD  traMADol (ULTRAM) 50 MG tablet TAKE 2 TABLETS BY MOUTH EVERY 6 HOURS AS NEEDED 03/30/22  Yes Raulkar, Drema Pry, MD  triamcinolone (NASACORT) 55 MCG/ACT AERO nasal inhaler Place 2 sprays into the nose daily. 11/04/21  Yes Shade Flood, MD  furosemide (LASIX) 20 MG tablet Take 1 tablet by mouth once daily 04/28/22   Shade Flood, MD  methylPREDNISolone acetate (DEPO-MEDROL) 80 MG/ML injection Take 80 mg every day by injection route for 1 day. Patient not taking: Reported on 04/28/2022 08/08/16   [provider]  penicillin v potassium (VEETID) 500 MG tablet TAKE ONE TABLET BY MOUTH FOUR TIMES DAILY TILL GONE Patient not taking: Reported on 04/28/2022    [provider]  azelastine (ASTELIN) 0.1 % nasal spray Place 2 sprays into both nostrils 2 (two) times daily. 04/23/19 12/26/19  Belinda Fisher, PA-C   Social History   Socioeconomic History   Marital status: Single    Spouse name: Not on file   Number of children: Not on file   Years  of education: Not on file   Highest education level: Not on file  Occupational History   Occupation: [employer]  Tobacco Use   Smoking status: Some Days    Packs/day: .5    Types: Cigarettes    Start date: 01/16/1985    Last attempt to quit: 10/08/2019    Years since quitting: 2.5   Smokeless tobacco: Never   Tobacco comments:    7- 8 cig a week  Vaping Use   Vaping Use: Never used  Substance and Sexual Activity   Alcohol use: No   Drug use: No   Sexual activity: Yes    Birth control/protection: Surgical  Other Topics Concern   Not on file  Social History Narrative   Right Handed    Caffeine rare   Live with husband and mother in law in a two story hpme   Social Determinants of Health   Financial Resource Strain: Not on file  Food Insecurity: Not on file  Transportation Needs: Not on file  Physical Activity: Not on file  Stress: Not on file  Social Connections: Not on file  Intimate Partner Violence: Not on file    Review of Systems  Per HPI.  Objective:   Vitals:   04/28/22 0927  BP: (!) 110/58  Pulse: (!) 104  Temp: 99.2 F (37.3 C)  SpO2: 96%  Weight: 257 lb 6 oz (116.7 kg)  Height:  (1.676 m)     Physical Exam Vitals reviewed.  Constitutional:      General: She is not in acute distress.    Appearance: She is well-developed.  HENT:     Head: Normocephalic and atraumatic.     Right Ear: Hearing, tympanic membrane, ear canal and external ear normal.     Left Ear: Hearing, tympanic membrane, ear canal and external ear normal.     Nose: Congestion (minimal. mild irritation of R nares without swelling or erythema/discharge.) present.     Comments: Mild max sinus ttp.     Mouth/Throat:     Pharynx: No oropharyngeal exudate.  Eyes:     Conjunctiva/sclera: Conjunctivae normal.     Pupils: Pupils are equal, round, and reactive to light.  Cardiovascular:     Rate and Rhythm: Normal rate and regular rhythm.     Heart  sounds: Normal heart sounds.  No murmur heard. Pulmonary:     Effort: Pulmonary effort is normal. No respiratory distress.     Breath sounds: Normal breath sounds. No wheezing or rhonchi.  Skin:    General: Skin is warm and dry.     Findings: No rash.  Neurological:     Mental Status: She is alert and oriented to person, place, and time.  Psychiatric:        Behavior: Behavior normal.    Assessment & Plan:  Teresa Castaneda is a 52 y.o. female . Follow-up exam  Cough, unspecified type  Mild intermittent asthma, unspecified whether complicated - Plan: predniSONE (DELTASONE) 20 MG tablet  Sinus congestion - Plan: predniSONE (DELTASONE) 20 MG tablet  Psychophysiological insomnia - Plan: hydrOXYzine (ATARAX) 25 MG tablet  Allergic rhinitis, unspecified seasonality, unspecified trigger - Plan: hydrOXYzine (ATARAX) 25 MG tablet, predniSONE (DELTASONE) 20 MG tablet  Improvement with sinus symptoms, likely sinusitis that should be improved with doxycycline, and secondary allergic rhinitis with worsening symptoms with current pollen amounts.  Continue Nasacort, hydroxyzine at bedtime for sleep with situational stressors as above.  Option of prednisone if not improving next few days with potential side effects discussed, RTC precautions given.   I expressed condolences on the loss of her nephew, hydroxyzine refilled to help with sleep, which may also help with allergies as above.  Potential side effects and additive side effects with Xyzal discussed.  She declined any need for counseling or other resources at this time but advised her to let me know if I can help in this difficult time.   No orders of the defined types were placed in this encounter.  There are no Patient Instructions on file for this visit.    Signed,   Meredith Staggers, MD [facility], [facility] 04/28/22 10:08 AM

## 2022-04-28 NOTE — Telephone Encounter (Signed)
Contacted [facility] pharmacy and spoke to [place] regarding to a fax on Duloxentine 60mg  capsule.  Pharmacy requesting a clarification on the direction if pt is taking 1 or 2 daily.  Spoke to pt prior to confirm. Pt states she is taking 2 caps at night.  Inform [place] that pt is taking 2 capsules. She states she will update it to pt's chart. No further action is needed.

## 2022-04-28 NOTE — Patient Instructions (Addendum)
I am very sorry to hear about the loss in the family. I refilled hydroxyzine - up to 3 times per day, but can be started for sleep initially. Please let me know if other resources needed during this difficulty time.   Okay to continue levocetirizine for antihistamine, hydroxyzine works the same way, so that may help at bedtime.  I would be cautious taking that during the day with the levocetirizine.  Continue Nasacort, saline nasal spray, and if allergy symptoms, congestion or not improving in the next few days can start the prednisone.  Keep me posted.  Hope you feel better soon and take care.

## 2022-05-05 ENCOUNTER — Telehealth: Payer: Self-pay

## 2022-05-12 ENCOUNTER — Ambulatory Visit: Payer: BC Managed Care – PPO | Admitting: Family Medicine

## 2022-05-12 VITALS — BP 120/60 | HR 93 | Temp 98.0°F | Ht 66.0 in | Wt 260.0 lb

## 2022-05-12 DIAGNOSIS — J329 Chronic sinusitis, unspecified: Secondary | ICD-10-CM | POA: Diagnosis not present

## 2022-05-12 DIAGNOSIS — E1140 Type 2 diabetes mellitus with diabetic neuropathy, unspecified: Secondary | ICD-10-CM | POA: Diagnosis not present

## 2022-05-12 LAB — COMPREHENSIVE METABOLIC PANEL
ALT: 16 U/L (ref 0–35)
AST: 23 U/L (ref 0–37)
Albumin: 3.6 g/dL (ref 3.5–5.2)
Alkaline Phosphatase: 96 U/L (ref 39–117)
BUN: 13 mg/dL (ref 6–23)
CO2: 25 mEq/L (ref 19–32)
Calcium: 8.8 mg/dL (ref 8.4–10.5)
Chloride: 104 mEq/L (ref 96–112)
Creatinine, Ser: 0.63 mg/dL (ref 0.40–1.20)
GFR: 103.42 mL/min (ref >60.00)
Glucose, Bld: 115 mg/dL — ABNORMAL HIGH (ref 70–99)
Potassium: 4.2 mEq/L (ref 3.5–5.1)
Sodium: 137 mEq/L (ref 135–145)
Total Bilirubin: 0.4 mg/dL (ref 0.2–1.2)
Total Protein: 7.4 g/dL (ref 6.0–8.3)

## 2022-05-12 LAB — MICROALBUMIN / CREATININE URINE RATIO
Creatinine,U: 60.6 mg/dL
Microalb Creat Ratio: 1.2 mg/g (ref 0.0–30.0)
Microalb, Ur: <0.7 mg/dL (ref 0.0–1.9)

## 2022-05-12 LAB — LIPID PANEL
Cholesterol: 116 mg/dL (ref 0–200)
HDL: 39.4 mg/dL (ref >39.00)
LDL Cholesterol: 64 mg/dL (ref 0–99)
NonHDL: 76.67
Total CHOL/HDL Ratio: 3
Triglycerides: 61 mg/dL (ref 0.0–149.0)
VLDL: 12.2 mg/dL (ref 0.0–40.0)

## 2022-05-12 MED ORDER — DOXYCYCLINE HYCLATE 100 MG PO TABS
100.0000 mg | ORAL_TABLET | Freq: Two times a day (BID) | ORAL | 0 refills | Status: DC
Start: 2022-05-12 — End: 2022-06-14

## 2022-05-12 NOTE — Patient Instructions (Addendum)
Talk to pain management, but can try additional dose of gabapentin in the morning to see if that helps with burning in feet.  We can try one more round of doxycycline, but if that does not help will need to see ENT.   Hang in there.

## 2022-05-12 NOTE — Assessment & Plan Note (Signed)
Initially improved with first round of doxycycline, then worsening off antibiotic.  Suspect component of chronic sinusitis but will try 1 additional dose of doxycycline, then if not improving follow-up with ENT.  Continue saline nasal spray and allergy treatment with steroid nasal spray.  RTC precautions.  Potential side effects and risks of recurrent antibiotics discussed.

## 2022-05-12 NOTE — Progress Notes (Unsigned)
Subjective:  Patient ID: Teresa Castaneda, female    DOB: 1970-04-18  Age: 52 y.o. MRN: [id number]  CC:  Chief Complaint  Patient presents with   Hyperlipidemia   Diabetes    Pt is here today for med check and labs. Pt reports she has fluid on both feet. Pt reports pain in her feet. Pt is FASTING.    HPI Teresa Castaneda presents for   Diabetes: With diabetic neuropathy, followed by endocrinology.  A1c improved April 8. Treated with gabapentin 300 mg in the morning, 2 at lunch, 4 at night - doing ok. Notes when not taking. Breakthrough burning still noted.  No side effects with gabapentin.  She is also followed by pain management with [facility], Treated with tramadol, option of Qutenza in February.  On duloxetine 60 mg -2/day.   Microalbumin: Ordered today Optho, foot exam, pneumovax:  Ophthalmology - due. Life has been busy. Trying to schedule through here.   Lab Results  Component Value Date   HGBA1C 6.6 (A) 04/24/2022   HGBA1C 7.4 (A) 10/21/2021   HGBA1C 9.1 (H) 08/30/2021   Lab Results  Component Value Date   LDLCALC 63 02/18/2021   CREATININE 0.49 08/30/2021   Hyperlipidemia: Lipitor 10 mg daily.  Fasting today, due for labs. No new side effects.  Lab Results  Component Value Date   CHOL 126 02/18/2021   HDL 47.60 02/18/2021   LDLCALC 63 02/18/2021   TRIG 77.0 02/18/2021   CHOLHDL 3 02/18/2021   Lab Results  Component Value Date   ALT 29 08/30/2021   AST 39 (H) 08/30/2021   ALKPHOS 81 08/30/2021   BILITOT 0.7 08/30/2021   Cough, nasal congestion, allergic rhinitis, asthma. See prior visit.  Most recently seen April 12.  Improved sinus symptoms, status post doxycycline.  Worsened allergy symptoms, continue Nasacort, hydroxyzine at bedtime to help with situational stressors, Possible secondary benefit for allergy symptoms, and option of prednisone if not improving within a few days.  Was continued on Xyzal with potential additive  side effects with hydroxyzine. Did not need prednisone.  Sinuses worsened off antibiotic - thick green nasal d/c. No fever.  Using nasacort and saline nasal spray, nasal washes. PND with some cough. Has been using trelegy QD.   Doing ok otherwise, including with news of suicide in family from last visit.   History Patient Active Problem List   Diagnosis Date Noted   Recurrent sinusitis 05/12/2022   Type 2 diabetes mellitus with diabetic neuropathy, without long-term current use of insulin (HCC) 05/12/2022   Cirrhosis of liver (HCC) 06/23/2020   Symptomatic cholelithiasis 05/10/2020   Acquired hypothyroidism 04/05/2020   Weight gain 04/05/2020   Type 2 diabetes mellitus with hyperglycemia, without long-term current use of insulin (HCC) 04/05/2020   History of COVID-19 09/23/2019   Pneumonia due to COVID-19 virus 09/23/2019   Cough 09/23/2019   BMI 40.0-44.9, adult (HCC) 07/22/2019   Facet arthropathy, lumbar 07/22/2019   Sacroiliitis (HCC) 07/22/2019   Asthma 09/30/2016   ANXIETY 09/10/2006   DEPRESSION 09/10/2006   ASTHMA 09/10/2006   HEADACHE 09/10/2006    Past Medical History:  Diagnosis Date   Allergies    Anxiety    Arthritis    Asthma    Back pain    Spinal Issue    Diabetes mellitus without complication (HCC)    DJD (degenerative joint disease), lumbar    Elevated cholesterol    Gallstone    GERD (gastroesophageal  reflux disease)    Liver cirrhosis secondary to NASH (HCC)    Neuromuscular disorder (HCC)    Obesity    Oxygen deficiency    Thyroid disease     {History (Optional):23778}  Review of Systems Per HPI   Objective:   Vitals:   05/12/22 0804  BP: 120/60  Pulse: 93  Temp: 98 F (36.7 C)  SpO2: 98%  Weight: 260 lb (117.9 kg)  Height: 5\' 6"  (1.676 m)   {Vitals History (Optional):23777}  Physical Exam Vitals reviewed.  Constitutional:      General: She is not in acute distress.    Appearance: Normal appearance. She is well-developed.   HENT:     Head: Normocephalic and atraumatic.     Right Ear: Hearing, tympanic membrane, ear canal and external ear normal.     Left Ear: Hearing, tympanic membrane, ear canal and external ear normal.     Nose: Nose normal.     Comments: Frontal, maxillary sinus ttp.     Mouth/Throat:     Pharynx: No posterior oropharyngeal erythema.  Eyes:     Conjunctiva/sclera: Conjunctivae normal.     Pupils: Pupils are equal, round, and reactive to light.  Neck:     Vascular: No carotid bruit.  Cardiovascular:     Rate and Rhythm: Normal rate and regular rhythm.     Heart sounds: Normal heart sounds. No murmur heard. Pulmonary:     Effort: Pulmonary effort is normal. No respiratory distress.     Breath sounds: Normal breath sounds. No wheezing or rhonchi.  Abdominal:     Palpations: Abdomen is soft. There is no pulsatile mass.     Tenderness: There is no abdominal tenderness.  Musculoskeletal:     Right lower leg: No edema.     Left lower leg: No edema.  Skin:    General: Skin is warm and dry.     Findings: No rash.  Neurological:     Mental Status: She is alert and oriented to person, place, and time.  Psychiatric:        Mood and Affect: Mood normal.        Behavior: Behavior normal.      Assessment & Plan:  Teresa Castaneda is a 52 y.o. female . Type 2 diabetes mellitus with diabetic neuropathy, without long-term current use of insulin (HCC) Assessment & Plan: Worsening neuropathy, additional dose of gabapentin in the morning discussed and recommended she discuss other treatments with her pain management specialist.  RTC precautions.  Check lipid panel, CMP, continue same dose statin.  Follow-up with endocrinology as planned for diabetic management.  Orders: -     Microalbumin / creatinine urine ratio -     Comprehensive metabolic panel -     Lipid panel  Recurrent sinusitis Assessment & Plan: Initially improved with first round of doxycycline, then worsening off antibiotic.   Suspect component of chronic sinusitis but will try 1 additional dose of doxycycline, then if not improving follow-up with ENT.  Continue saline nasal spray and allergy treatment with steroid nasal spray.  RTC precautions.  Potential side effects and risks of recurrent antibiotics discussed.  Orders: -     Doxycycline Hyclate; Take 1 tablet (100 mg total) by mouth 2 (two) times daily.  Dispense: 20 tablet; Refill: 0    Patient Instructions  Talk to pain management, but can try additional dose of gabapentin in the morning to see if that helps with burning in feet.  We can  try one more round of doxycycline, but if that does not help will need to see ENT.   Hang in there.      Signed,   Meredith Staggers, MD [facility], [facility] 05/12/22 8:42 AM

## 2022-05-12 NOTE — Assessment & Plan Note (Signed)
Worsening neuropathy, additional dose of gabapentin in the morning discussed and recommended she discuss other treatments with her pain management specialist.  RTC precautions.  Check lipid panel, CMP, continue same dose statin.  Follow-up with endocrinology as planned for diabetic management.

## 2022-05-18 ENCOUNTER — Encounter: Payer: Self-pay | Admitting: Physical Medicine and Rehabilitation

## 2022-05-18 ENCOUNTER — Encounter
Payer: BC Managed Care – PPO | Attending: Physical Medicine and Rehabilitation | Admitting: Physical Medicine and Rehabilitation

## 2022-05-18 VITALS — BP 124/76 | HR 99 | Ht 66.0 in | Wt 261.0 lb

## 2022-05-18 DIAGNOSIS — G6289 Other specified polyneuropathies: Secondary | ICD-10-CM | POA: Insufficient documentation

## 2022-05-18 DIAGNOSIS — M7989 Other specified soft tissue disorders: Secondary | ICD-10-CM

## 2022-05-18 MED ORDER — TRAMADOL HCL 50 MG PO TABS: 100.0000 mg | ORAL_TABLET | Freq: Four times a day (QID) | ORAL | 0 refills | Status: DC | PRN

## 2022-05-18 MED ORDER — CAPSAICIN-CLEANSING GEL 8 % EX KIT
4.0000 | PACK | Freq: Once | CUTANEOUS | Status: AC
Start: 2022-05-18 — End: 2022-05-18
  Administered 2022-05-18: 4 via TOPICAL

## 2022-05-18 NOTE — Progress Notes (Signed)
Subjective:    Patient ID: Teresa Castaneda, female    DOB: [birth year], 52 y.o.   MRN: [id number]  HPI  Teresa Castaneda is a 52 year old woman who presents for f/u of neuropathy 1) Peripheral neuropathy -tramadol helps for 3 hours, she always takes two 50mg  tablets at a time. She notes that she contacted pharmacy yesterday and they told her that the reason the medication was denied was because it was ordered as 100mg  tablets.  -she would like to try Qutenza in the future if it is fully covered -pain is getting worse -she is taking 120 cymbalta -pain is 7/10 -neuropathy was not bad last year -now it is much more severe espcially when she stands on certain surfaces  2) Insomnia -sleeping poorly at night  3) right foot swelling -she is willing to get [place] -she takes lasix 20mg  daily  Pain Inventory Average Pain 5 Pain Right Now 7 My pain is sharp, burning, stabbing, tingling, and aching  In the last 24 hours, has pain interfered with the following? General activity 4 Relation with others 5 Enjoyment of life 4 What TIME of day is your pain at its worst? morning  Sleep (in general) Poor  Pain is worse with: walking, standing, and some activites Pain improves with: rest and medication Relief from Meds: 6  Family History  Problem Relation Age of Onset   Coronary artery disease Mother    Diabetes Mother    COPD Mother    Coronary artery disease Father    Hypertension Father    Colon cancer Neg Hx    Esophageal cancer Neg Hx    Social History   Socioeconomic History   Marital status: Single    Spouse name: Not on file   Number of children: Not on file   Years of education: Not on file   Highest education level: 9th grade  Occupational History   Occupation: Development worker, international aid  Tobacco Use   Smoking status: Some Days    Packs/day: .5    Types: Cigarettes    Start date: 01/16/1985    Last attempt to quit: 10/08/2019    Years since quitting: 2.6   Smokeless tobacco: Never    Tobacco comments:    7- 8 cig a week  Vaping Use   Vaping Use: Never used  Substance and Sexual Activity   Alcohol use: No   Drug use: No   Sexual activity: Yes    Birth control/protection: Surgical  Other Topics Concern   Not on file  Social History Narrative   Right Handed    Caffeine rare   Live with husband and mother in law in a two story hpme   Social Determinants of Health   Financial Resource Strain: Not on file  Food Insecurity: Food Insecurity Present (05/12/2022)   Hunger Vital Sign    Worried About Running Out of Food in the Last Year: Never true    Ran Out of Food in the Last Year: Sometimes true  Transportation Needs: No Transportation Needs (05/12/2022)   PRAPARE - Administrator, Civil Service (Medical): No    Lack of Transportation (Non-Medical): No  Physical Activity: Unknown (05/12/2022)   Exercise Vital Sign    Days of Exercise per Week: 1 day    Minutes of Exercise per Session: Patient declined  Stress: Stress Concern Present (05/12/2022)   [employer] of Occupational Health - Occupational Stress Questionnaire    Feeling of Stress : Very much  Social Connections: Moderately Integrated (05/12/2022)   Social Connection and Isolation Panel [NHANES]    Frequency of Communication with Friends and Family: Twice a week    Frequency of Social Gatherings with Friends and Family: Twice a week    Attends Religious Services: More than 4 times per year    Active Member of [employer] or Organizations: No    Attends Engineer, structural: Not on file    Marital Status: Married   Past Surgical History:  Procedure Laterality Date   ABDOMINAL HYSTERECTOMY  02/21/2003   CHOLECYSTECTOMY N/A 05/10/2020   Procedure: LAPAROSCOPIC CHOLECYSTECTOMY WITH INTRAOPERATIVE CHOLANGIOGRAM AND LIVER BIOPSY;  Surgeon: Quentin Ore, MD;  Location: [facility];  Service: General;  Laterality: N/A;   DG SACROILIAC JOINTS ([facility] HX)  01/11/2021   [facility]    ENDOSCOPIC TURBINATE REDUCTION Bilateral 10/13/2020   Procedure: ENDOSCOPIC BILATERAL INFERIOR TURBINATE REDUCTIONS;  Surgeon: Drema Halon, MD;  Location: [facility];  Service: ENT;  Laterality: Bilateral;   ETHMOIDECTOMY Bilateral 10/13/2020   Procedure: BILATERAL TOTAL ETHMOIDECTOMY AND MAXILLARY OSTIA ENLARGEMENTS;  Surgeon: Drema Halon, MD;  Location: [facility];  Service: ENT;  Laterality: Bilateral;   LIVER BIOPSY     NASAL SINUS SURGERY Bilateral 10/13/2020   Procedure: SINUS ENDOSCOPY WITH STEALTH NAVIGATION;  Surgeon: Drema Halon, MD;  Location: [facility];  Service: ENT;  Laterality: Bilateral;   TONSILLECTOMY     TOTAL ABDOMINAL HYSTERECTOMY     Past Surgical History:  Procedure Laterality Date   ABDOMINAL HYSTERECTOMY  02/21/2003   CHOLECYSTECTOMY N/A 05/10/2020   Procedure: LAPAROSCOPIC CHOLECYSTECTOMY WITH INTRAOPERATIVE CHOLANGIOGRAM AND LIVER BIOPSY;  Surgeon: Quentin Ore, MD;  Location: [facility];  Service: General;  Laterality: N/A;   DG SACROILIAC JOINTS ([facility] HX)  01/11/2021   [facility]   ENDOSCOPIC TURBINATE REDUCTION Bilateral 10/13/2020   Procedure: ENDOSCOPIC BILATERAL INFERIOR TURBINATE REDUCTIONS;  Surgeon: Drema Halon, MD;  Location: [facility];  Service: ENT;  Laterality: Bilateral;   ETHMOIDECTOMY Bilateral 10/13/2020   Procedure: BILATERAL TOTAL ETHMOIDECTOMY AND MAXILLARY OSTIA ENLARGEMENTS;  Surgeon: Drema Halon, MD;  Location: [facility];  Service: ENT;  Laterality: Bilateral;   LIVER BIOPSY     NASAL SINUS SURGERY Bilateral 10/13/2020   Procedure: SINUS ENDOSCOPY WITH STEALTH NAVIGATION;  Surgeon: Drema Halon, MD;  Location: [facility];  Service: ENT;  Laterality: Bilateral;   TONSILLECTOMY     TOTAL ABDOMINAL HYSTERECTOMY     Past Medical History:  Diagnosis Date   Allergies    Anxiety     Arthritis    Asthma    Back pain    Spinal Issue    Diabetes mellitus without complication (HCC)    DJD (degenerative joint disease), lumbar    Elevated cholesterol    Gallstone    GERD (gastroesophageal reflux disease)    Liver cirrhosis secondary to NASH (HCC)    Neuromuscular disorder (HCC)    Obesity    Oxygen deficiency    Thyroid disease    BP 124/76   Pulse 99   Ht 5\' 6"  (1.676 m)   Wt 261 lb (118.4 kg)   SpO2 95%   BMI 42.13 kg/m   Opioid Risk Score:   Fall Risk Score:  `1  Depression screen [facility] 2/9     05/18/2022    9:58 AM 04/19/2022    8:32 AM 04/19/2022    8:31 AM  11/11/2021   10:54 AM 11/04/2021    8:06 AM 05/02/2021    8:47 AM 03/02/2021   11:16 AM  Depression screen PHQ 2/9  Decreased Interest 1 1 0 1 1 2 1   Down, Depressed, Hopeless 1 1 0 3 2 1 1   PHQ - 2 Score 2 2 0 4 3 3 2   Altered sleeping  3  1 3 2 2   Tired, decreased energy  3  3 3 2 2   Change in appetite  2  2 1 1  0  Feeling bad or failure about yourself   1  2 0 1 0  Trouble concentrating  1  1 2 1 1   Moving slowly or fidgety/restless  1  0 0 2 0  Suicidal thoughts  0  0 0 0 0  PHQ-9 Score  13  13 12 12 7   Difficult doing work/chores  Somewhat difficult  Very difficult         Review of Systems  Musculoskeletal:        Bilateral foot pain  All other systems reviewed and are negative.      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  -increase Tramadol to 100mg  q6H prn, have sent in new script today as  patient was told it was denied yesterday due to the request for 100mg  tablets -Discussed Qutenza as an option for neuropathic pain control. Discussed that this is a capsaicin patch, stronger than capsaicin cream. Discussed that it is currently approved for diabetic peripheral neuropathy and post-herpetic neuralgia, but that it has also shown benefit in treating other forms of neuropathy. Provided patient with link to site to learn more about the patch: https://www.clark.biz/. Discussed that the patch would be placed in office and benefits usually last 3 months. Discussed that unintended exposure to capsaicin can cause severe irritation of eyes, mucous membranes, respiratory tract, and skin, but that Qutenza is a local treatment and does not have the systemic side effects of other nerve medications. Discussed that there may be pain, itching, erythema, and decreased sensory function associated with the application of Qutenza. Side effects usually subside within 1 week. A cold pack of analgesic medications can help with these side effects. Blood pressure can also be increased due to pain associated with administration of the patch.   4 patches of Qutenza was applied to the area of pain. Ice packs were applied during the procedure to ensure patient comfort. Blood pressure was monitored every 15 minutes. The patient tolerated the procedure well. Post-procedure instructions were given and follow-up has been scheduled.   -discussed that we will try again for Qutenza next visit if this can be covered fully for her -Discussed benefits of exercise in reducing pain. -Discussed following foods that may reduce pain: 1) Ginger (especially studied for arthritis)- reduce leukotriene production to decrease inflammation 2) Blueberries- high in phytonutrients that decrease inflammation 3) Salmon- marine omega-3s reduce joint swelling and pain 4) Pumpkin seeds- reduce inflammation 5) dark chocolate- reduces  inflammation 6) turmeric- reduces inflammation 7) tart cherries -  reduce pain and stiffness 8) extra virgin olive oil - its compound olecanthal helps to block prostaglandins  9) chili peppers- can be eaten or applied topically via capsaicin 10) mint- helpful for headache, muscle aches, joint pain, and itching 11) garlic- reduces inflammation  Link to further information on diet for chronic pain: http://www.bray.com/   2) Insomnia: -Try to go outside near sunrise -Get exercise during the day.  -Turn off all devices an hour before bedtime.  -Teas that can benefit: chamomile, valerian root, Brahmi (Bacopa) -Can consider over the counter melatonin, magnesium, and/or L-theanine. Melatonin is an anti-oxidant with multiple health benefits. Magnesium is involved in greater than 300 enzymatic reactions in the body and most of [place] are deficient as our soil is often depleted. There are 7 different types of magnesium- Bioptemizer's is a supplement with all 7 types, and each has unique benefits. Magnesium can also help with constipation and anxiety.  -Pistachios naturally increase the production of melatonin -Cozy Earth bamboo bed sheets are free from toxic chemicals.  -Tart cherry juice or a tart cherry supplement can improve sleep and soreness post-workout  3) Right lower extremity swelling -vascular US ordered -advised to take 2 lasix today -provided note for work  >40 minutes spent in discussion of risks and benefits of Qutenza and obtaining informed consent, discussion of q90 day follow-up and expectation of improvement in pain with each repeat application , ordering vascular ultrasound for right lower extremity to assess for clot, advised to take two lasix today to help with swelling, reviewed most recent creatinine and discussed it was in normal range, provided a note for work as she was unable to work yesterday and today due to  her leg swelling, discussion of response to [employer]

## 2022-05-23 ENCOUNTER — Other Ambulatory Visit: Payer: Self-pay | Admitting: Family Medicine

## 2022-05-23 DIAGNOSIS — E1165 Type 2 diabetes mellitus with hyperglycemia: Secondary | ICD-10-CM

## 2022-05-23 NOTE — Telephone Encounter (Signed)
controlled substance database reviewed, gabapentin last filled 04/25/2022, refill ordered.

## 2022-05-23 NOTE — Telephone Encounter (Signed)
Gabapentin 300 mg LOV: 05/12/22 Last Refill:04/19/22 Upcoming appt: none

## 2022-05-25 ENCOUNTER — Other Ambulatory Visit: Payer: Self-pay | Admitting: *Deleted

## 2022-05-25 ENCOUNTER — Telehealth: Payer: Self-pay

## 2022-05-25 DIAGNOSIS — G6289 Other specified polyneuropathies: Secondary | ICD-10-CM

## 2022-05-25 MED ORDER — QUTENZA (4 PATCH) 8 % EX KIT: 4.0000 | PACK | Freq: Once | CUTANEOUS | 0 refills | Status: DC

## 2022-05-25 MED ORDER — QUTENZA (4 PATCH) 8 % EX KIT
4.0000 | PACK | Freq: Once | CUTANEOUS | 0 refills | Status: AC
Start: 2022-05-25 — End: 2022-05-25

## 2022-05-25 NOTE — Telephone Encounter (Signed)
Sent to specialty pharmacy for August appt

## 2022-05-26 ENCOUNTER — Encounter: Payer: Self-pay | Admitting: Family Medicine

## 2022-06-01 ENCOUNTER — Encounter: Payer: Self-pay | Admitting: Physical Medicine and Rehabilitation

## 2022-06-01 MED ORDER — TRAMADOL HCL 50 MG PO TABS: 100.0000 mg | ORAL_TABLET | Freq: Four times a day (QID) | ORAL | 0 refills | Status: DC | PRN

## 2022-06-02 ENCOUNTER — Telehealth: Payer: Self-pay | Admitting: *Deleted

## 2022-06-02 NOTE — Telephone Encounter (Signed)
[facility] [place] (Key: [id number]) Rx #: [id number] Need Help? Call us at [phone] Status sent iconSent to Plan today

## 2022-06-05 NOTE — Telephone Encounter (Signed)
Outcome Approved today Your PA request has been approved. Additional information will be provided in the approval communication. (Message 1145) Authorization Expiration Date: 12/02/2022 

## 2022-06-05 NOTE — Telephone Encounter (Signed)
Teresa Castaneda (Teresa Castaneda) PA Case ID #: [id number] Rx #: [id number]

## 2022-06-14 ENCOUNTER — Encounter: Payer: Self-pay | Admitting: Gastroenterology

## 2022-06-14 ENCOUNTER — Ambulatory Visit: Payer: BC Managed Care – PPO | Admitting: Gastroenterology

## 2022-06-14 VITALS — BP 118/60 | HR 68 | Ht 66.0 in | Wt 249.5 lb

## 2022-06-14 DIAGNOSIS — K7581 Nonalcoholic steatohepatitis (NASH): Secondary | ICD-10-CM | POA: Diagnosis not present

## 2022-06-14 DIAGNOSIS — K7460 Unspecified cirrhosis of liver: Secondary | ICD-10-CM | POA: Diagnosis not present

## 2022-06-14 DIAGNOSIS — K219 Gastro-esophageal reflux disease without esophagitis: Secondary | ICD-10-CM

## 2022-06-14 DIAGNOSIS — Z8719 Personal history of other diseases of the digestive system: Secondary | ICD-10-CM | POA: Diagnosis not present

## 2022-06-14 MED ORDER — FUROSEMIDE 20 MG PO TABS: 20.0000 mg | ORAL_TABLET | Freq: Two times a day (BID) | ORAL | 4 refills | Status: DC

## 2022-06-14 NOTE — Progress Notes (Signed)
Chief Complaint: FU  Referring Provider:  Shade Flood, MD      ASSESSMENT AND PLAN;   #1. GERD   #2. 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 No EV on EGD 07/2020. No ascites.  -Neg extensive work-up. No ETOH.   -AFP was borderline elevated.  Neg CT Abdo/pelvis for any liver masses 05/2020.  Neg [place] 04/2021 -Has subclinical hepatic encephalopathy.  Could not tolerate lactulose. On rifaximin 550 twice daily.  #3. H/O biliary colic s/p lap chole with neg IOC and liver Bx 05/10/2020.  Plan: -Low salt, normal protein diet -Continue Rifaxamin 550mg  po BID. -Continue omeprazole 40mg  po QD #90 -Continue lasix 20mg  po BID -[place] abdo -Continue wt loss. -Best possible control for DM. -FU in 6 months. At FU, check labs (CBC, CMP, INR, AFP) and rpt EGD.     HPI:    Teresa Castaneda is a 52 y.o. female  With DM, obesity, DJD, HLD, anxiety, L foot #, sinus Sx  For follow-up visit. [place] 04/2021-stable liver cirrhosis without ascites, stable mild splenomegaly.  No nausea, vomiting, heartburn, regurgitation, odynophagia or dysphagia.  No significant diarrhea or constipation.  No melena or hematochezia. No unintentional weight loss. No abdominal pain.  3BMs/day.  Has stopped Dr. Reino Kent.  Had problems adjusting her thyroid medications.  Has gained weight  Has been on Mounjaro  1/week- lost 20lb as below.   Wt Readings from Last 3 Encounters:  06/14/22 249 lb 8 oz (113.2 kg)  05/18/22 261 lb (118.4 kg)  05/12/22 260 lb (117.9 kg)        From previous notes:  Had Biliary colic (RUQ pain) , underwent lap chole with neg IOC and liver Bx 05/10/2020. Liver Bx reviewed at [facility] showed steatohepatitis with stage IV cirrhosis.  She does have FH of NASH cirrhosis (mom).  Unfortunately, her mom has passed away due to complications from diabetes.  Could not tolerate lactulose due to diarrhea.  Denies having any significant  change in her mental status.  She continues to be on rifaximin.  No skin lesions, easy bruisability, intake of OTC meds including diet pills, herbal medications, anabolic steroids or Tylenol. There is no H/O blood transfusions, IVDA. No jaundice, dark urine or pale stools. No alcohol abuse.  No chocolates, chewing gums, artificial sweeteners and candy. No NSAIDs.  She would drink 4 Dr. Alcus Dad per day.  Has reduced it to 2/day.  Wt Readings from Last 3 Encounters:  06/14/22 249 lb 8 oz (113.2 kg)  05/18/22 261 lb (118.4 kg)  05/12/22 260 lb (117.9 kg)      Past GI procedures:  [place] 04/2011 1. Cirrhotic morphology of the liver.  No focal mass identified. 2. The splenic length was measured at 11.7 cm today versus greater than 13 cm previously. I suspect the splenic length may not have been accurately measured today and visually, I am still concerned for splenomegaly although a volume was not obtained. 3. The common bile duct measures 6.7 mm which is mildly dilated, within normal limits given previous cholecystectomy.  Colon 07/2020 - One 6 mm polyp in the proximal ascending colon, removed with a cold snare. Resected and retrieved. Bx- TA - Four 6 to 8 mm polyps in the mid rectum and in the distal sigmoid colon, removed with a cold snare. Resected and retrieved. Bx-hyperplastic. - Non-bleeding internal hemorrhoids. - The examined portion of the ileum was normal. - The examination was otherwise normal on direct and  retroflexion views. - Negative random colonic biopsies. - Next due 07/2027  EGD 07/2020 - Mild gastritis. - Salmon-colored mucosa projecting 1 cm above GE junction. Bx- neg for Barrett's. - No esophageal varices. - Negative small bowel biopsies for celiac disease.  CT Abdo/pelvis with contrast 06/09/2020 1. Morphologic features of the liver compatible with cirrhosis. No suspicious liver lesions identified. 2. Splenomegaly. 3. Aortic atherosclerosis.  [place] 10/2020 1.  Cirrhotic morphology of the liver. No focal liver lesion is identified. 2. Sequela of portal hypertension including splenomegaly. 3. Prior cholecystectomy. 4. No Ascites  Additional liver work-up -AFP 6.5 10/15/2020 -ANA 1:40 -Neg AMA, celiac serology, iron studies, alpha-1 antitrypsin, ASMA -Ammonia 55 -Not immune to hepatitis A/B, s/p vaccines    Additional GI history: Op note from lap chole 05/10/2020-reviewed Ivar Drape, MD General, Bariatric, & Minimally Invasive Surgery [facility], [place]) -Cirrhotic appearing liver. Bx-Liver Bx reviewed at [facility] showed steatohepatitis with stage IV cirrhosis. -IOC negative -No ascites or any obvious varices  Past Medical History:  Diagnosis Date   Allergies    Anxiety    Arthritis    Asthma    Back pain    Spinal Issue    Diabetes mellitus without complication (HCC)    DJD (degenerative joint disease), lumbar    Elevated cholesterol    Gallstone    GERD (gastroesophageal reflux disease)    Liver cirrhosis secondary to NASH (HCC)    Neuromuscular disorder (HCC)    Obesity    Oxygen deficiency    Thyroid disease     Past Surgical History:  Procedure Laterality Date   ABDOMINAL HYSTERECTOMY  02/21/2003   CHOLECYSTECTOMY N/A 05/10/2020   Procedure: LAPAROSCOPIC CHOLECYSTECTOMY WITH INTRAOPERATIVE CHOLANGIOGRAM AND LIVER BIOPSY;  Surgeon: Quentin Ore, MD;  Location: [facility];  Service: General;  Laterality: N/A;   DG SACROILIAC JOINTS ([facility] HX)  01/11/2021   [facility]   ENDOSCOPIC TURBINATE REDUCTION Bilateral 10/13/2020   Procedure: ENDOSCOPIC BILATERAL INFERIOR TURBINATE REDUCTIONS;  Surgeon: Drema Halon, MD;  Location: [facility];  Service: ENT;  Laterality: Bilateral;   ETHMOIDECTOMY Bilateral 10/13/2020   Procedure: BILATERAL TOTAL ETHMOIDECTOMY AND MAXILLARY OSTIA ENLARGEMENTS;  Surgeon: Drema Halon, MD;  Location: [facility];  Service: ENT;   Laterality: Bilateral;   LIVER BIOPSY     NASAL SINUS SURGERY Bilateral 10/13/2020   Procedure: SINUS ENDOSCOPY WITH STEALTH NAVIGATION;  Surgeon: Drema Halon, MD;  Location: [facility];  Service: ENT;  Laterality: Bilateral;   TONSILLECTOMY     TOTAL ABDOMINAL HYSTERECTOMY      Family History  Problem Relation Age of Onset   Coronary artery disease Mother    Diabetes Mother    COPD Mother    Coronary artery disease Father    Hypertension Father    Colon cancer Neg Hx    Esophageal cancer Neg Hx     Social History   Tobacco Use   Smoking status: Some Days    Packs/day: .5    Types: Cigarettes    Start date: 01/16/1985    Last attempt to quit: 10/08/2019    Years since quitting: 2.6   Smokeless tobacco: Never   Tobacco comments:    7- 8 cig a week  Vaping Use   Vaping Use: Never used  Substance Use Topics   Alcohol use: No   Drug use: No    Current Outpatient Medications  Medication Sig Dispense Refill   albuterol (VENTOLIN HFA) 108 (  90 Base) MCG/ACT inhaler Inhale 2 puffs into the lungs every 6 (six) hours as needed for wheezing or shortness of breath. 8 g 11   amitriptyline (ELAVIL) 10 MG tablet Take 1 tablet (10 mg total) by mouth at bedtime. 30 tablet 1   atorvastatin (LIPITOR) 10 MG tablet Take 1 tablet (10 mg total) by mouth daily. 90 tablet 3   DULoxetine (CYMBALTA) 60 MG capsule Take 1 capsule (60 mg total) by mouth daily. TAKE 2 CAPSULE BY MOUTH ONCE DAILY 90 capsule 1   FARXIGA 10 MG TABS tablet Take 1 tablet (10 mg total) by mouth daily. 90 tablet 3   Fluticasone-Umeclidin-Vilant (TRELEGY ELLIPTA) 100-62.5-25 MCG/ACT AEPB Inhale 1 puff into the lungs daily. 60 each 6   furosemide (LASIX) 20 MG tablet Take 1 tablet by mouth once daily 90 tablet 0   gabapentin (NEURONTIN) 300 MG capsule TAKE 1 CAPSULE BY MOUTH IN THE MORNING, 2 CAPSULES AT LUNCH AND 4 CAPSULES AT NIGHT 210 capsule 0   glipiZIDE (GLUCOTROL) 5 MG tablet Take 1 tablet (5 mg  total) by mouth daily before breakfast. 90 tablet 3   hydrOXYzine (ATARAX) 25 MG tablet Take 1 tablet 3 times a day by oral route as needed. 30 tablet 1   ibuprofen (ADVIL) 600 MG tablet Take 1 tablet (600 mg total) by mouth 2 (two) times daily as needed. 30 tablet 0   levocetirizine (XYZAL) 5 MG tablet Take 1 tablet (5 mg total) by mouth every evening. 90 tablet 3   levothyroxine (SYNTHROID) 150 MCG tablet Take 1 tablet (150 mcg total) by mouth daily. 90 tablet 3   lidocaine (LIDODERM) 5 % USE 1 PATCH EXTERNALLY ONCE DAILY REMOVE  AND  DISCARD  PATCH  WITH  12  HOURS  OR  AS  DIRECTED  BY  MD 30 patch 0   metFORMIN (GLUCOPHAGE) 850 MG tablet Take 1 tablet (850 mg total) by mouth 2 (two) times daily with a meal. 180 tablet 3   mupirocin ointment (BACTROBAN) 2 % Apply 1 Application topically 2 (two) times daily. For 5 days - inside of nose/wound. 22 g 0   omeprazole (PRILOSEC) 40 MG capsule Take 1 capsule (40 mg total) by mouth daily. 90 capsule 3   rifaximin (XIFAXAN) 550 MG TABS tablet Take 1 tablet (550 mg total) by mouth 2 (two) times daily. (Patient taking differently: Take 550 mg by mouth every other day.) 60 tablet 12   tirzepatide (MOUNJARO) 5 MG/0.5ML Pen Inject 5 mg into the skin once a week. 6 mL 3   traMADol (ULTRAM) 50 MG tablet Take 2 tablets (100 mg total) by mouth every 6 (six) hours as needed. 240 tablet 0   triamcinolone (NASACORT) 55 MCG/ACT AERO nasal inhaler Place 2 sprays into the nose daily. 1 each 12   No current facility-administered medications for this visit.    Allergies  Allergen Reactions   Clarithromycin Other (See Comments)    Body cramps    Codeine Nausea And Vomiting   Tylenol [Acetaminophen] Other (See Comments)    Can not take Tylenol products due to liver   Penicillins Rash    Review of Systems:  Constitutional: Denies fever, chills, diaphoresis, appetite change and has fatigue.      Physical Exam:    BP 118/60   Pulse 68   Ht 5\' 6"  (1.676 m)    Wt 249 lb 8 oz (113.2 kg)   BMI 40.27 kg/m  Wt Readings from Last 3 Encounters:  06/14/22 249 lb 8 oz (113.2 kg)  05/18/22 261 lb (118.4 kg)  05/12/22 260 lb (117.9 kg)   Constitutional:  Well-developed, in no acute distress. Psychiatric: Normal mood and affect. Behavior is normal. HEENT: Pupils normal.  Conjunctivae are normal. No scleral icterus.  Cardiovascular: Normal rate, regular rhythm. No edema Pulmonary/chest: Effort normal and breath sounds normal. No wheezing, rales or rhonchi. Abdominal: Soft, nondistended. Nontender. Bowel sounds active throughout. There are no masses palpable.  Liver palpated 2 cm below costal margin.  No ascites. Rectal: Deferred Neurological: Alert and oriented to person place and time. Skin: Skin is warm and dry. No rashes noted. Extremities-no edema.   Data Reviewed: I have personally reviewed following labs and imaging studies  CBC:    Latest Ref Rng & Units 08/30/2021   10:06 AM 05/10/2021    8:45 AM 02/11/2021    9:36 AM  CBC  WBC 4.0 - 10.5 K/uL 5.3  8.7  11.4   Hemoglobin 12.0 - 15.0 g/dL 40.9  81.1  91.4   Hematocrit 36.0 - 46.0 % 33.4  37.0  40.0   Platelets 150.0 - 400.0 K/uL 110.0  150.0      CMP:    Latest Ref Rng & Units 05/12/2022    8:37 AM 09/13/2021   11:07 AM 08/30/2021   10:06 AM  CMP  Glucose 70 - 99 mg/dL 782   956   BUN 6 - 23 mg/dL 13   6   Creatinine 2.13 - 1.20 mg/dL 0.86   5.78   Sodium 469 - 145 mEq/L 137   138   Potassium 3.5 - 5.1 mEq/L 4.2   3.9   Chloride 96 - 112 mEq/L 104   103   CO2 19 - 32 mEq/L 25   27   Calcium 8.4 - 10.5 mg/dL 8.8   9.0   Total Protein 6.0 - 8.3 g/dL 7.4  7.6  7.3   Total Bilirubin 0.2 - 1.2 mg/dL 0.4   0.7   Alkaline Phos 39 - 117 U/L 96   81   AST 0 - 37 U/L 23   39   ALT 0 - 35 U/L 16   29       Latest Ref Rng & Units 05/12/2022    8:37 AM 09/13/2021   11:07 AM 08/30/2021   10:06 AM  Hepatic Function  Total Protein 6.0 - 8.3 g/dL 7.4  7.6  7.3   Albumin 3.5 - 5.2 g/dL 3.6    3.5   AST 0 - 37 U/L 23   39   ALT 0 - 35 U/L 16   29   Alk Phosphatase 39 - 117 U/L 96   81   Total Bilirubin 0.2 - 1.2 mg/dL 0.4   0.7      Radiology Studies: No results found.     Edman Circle, MD 06/14/2022, 3:37 PM  Cc: Shade Flood, MD

## 2022-06-14 NOTE — Patient Instructions (Signed)
_______________________________________________________  If your blood pressure at your visit was 140/90 or greater, please contact your primary care physician to follow up on this.  _______________________________________________________  If you are age 51 or older, your body mass index should be between 23-30. Your Body mass index is 40.27 kg/m. If this is out of the aforementioned range listed, please consider follow up with your Primary Care Provider.  If you are age 46 or younger, your body mass index should be between 19-25. Your Body mass index is 40.27 kg/m. If this is out of the aformentioned range listed, please consider follow up with your Primary Care Provider.   ________________________________________________________  The [place] providers would like to encourage you to use [facility] to communicate with providers for non-urgent requests or questions.  Due to long hold times on the telephone, sending your provider a message by [facility] may be a faster and more efficient way to get a response.  Please allow 48 business hours for a response.  Please remember that this is for non-urgent requests.  _______________________________________________________  Bonita Quin have been scheduled for an abdominal ultrasound at [facility] (1st floor of hospital) on 06-21-2022 at 93am. Please arrive 30 minutes prior to your appointment for registration. Make certain not to have anything to eat or drink 6 hours prior to your appointment. Should you need to reschedule your appointment, please contact radiology at [phone]. This test typically takes about 30 minutes to perform.  Please follow up in 6 months. Give [place] a call at [phone] to schedule an appointment.  Continue with current medications and low salt diet and normal protein.  Thank you,  Dr. Lynann Bologna

## 2022-06-21 ENCOUNTER — Ambulatory Visit (HOSPITAL_COMMUNITY)
Admission: RE | Admit: 2022-06-21 | Discharge: 2022-06-21 | Disposition: A | Discharge status: Home / Self Care | Payer: BC Managed Care – PPO | Source: Ambulatory Visit | Attending: Gastroenterology | Admitting: Gastroenterology

## 2022-06-21 DIAGNOSIS — Z8719 Personal history of other diseases of the digestive system: Secondary | ICD-10-CM

## 2022-06-21 DIAGNOSIS — R161 Splenomegaly, not elsewhere classified: Secondary | ICD-10-CM | POA: Diagnosis not present

## 2022-06-21 DIAGNOSIS — K219 Gastro-esophageal reflux disease without esophagitis: Secondary | ICD-10-CM | POA: Diagnosis not present

## 2022-06-21 DIAGNOSIS — K7460 Unspecified cirrhosis of liver: Secondary | ICD-10-CM | POA: Insufficient documentation

## 2022-06-21 DIAGNOSIS — K7581 Nonalcoholic steatohepatitis (NASH): Secondary | ICD-10-CM | POA: Diagnosis not present

## 2022-07-03 ENCOUNTER — Other Ambulatory Visit: Payer: Self-pay | Admitting: Family Medicine

## 2022-07-03 DIAGNOSIS — E1165 Type 2 diabetes mellitus with hyperglycemia: Secondary | ICD-10-CM

## 2022-07-03 NOTE — Telephone Encounter (Signed)
Please fill, if appropriate.  

## 2022-07-03 NOTE — Telephone Encounter (Signed)
Med discussed in April, with 7 per day, discussed 1 additional dose if needed. Controlled substance database (PDMP) reviewed. No concerns appreciated. Filled #210 on 06/09/22. Refill ordered

## 2022-07-06 ENCOUNTER — Encounter: Payer: Self-pay | Admitting: Physical Medicine and Rehabilitation

## 2022-07-10 ENCOUNTER — Other Ambulatory Visit (HOSPITAL_COMMUNITY): Payer: Self-pay

## 2022-07-10 MED ORDER — TRAMADOL HCL 50 MG PO TABS: 100.0000 mg | ORAL_TABLET | Freq: Four times a day (QID) | ORAL | 0 refills | Status: DC | PRN

## 2022-07-11 ENCOUNTER — Telehealth: Payer: Self-pay

## 2022-07-11 NOTE — Telephone Encounter (Signed)
Called [facility] pharmacy to let them know of this . Called pt lvm to call office.

## 2022-07-11 NOTE — Telephone Encounter (Signed)
Patient Advocate Encounter  Prior Authorization for Gabapentin 300MG  capsules has been approved with [employer].    PA# [id number] Effective dates: 07/11/22 through 07/11/23

## 2022-07-12 NOTE — Telephone Encounter (Signed)
Left vm to call office

## 2022-07-13 ENCOUNTER — Telehealth: Payer: Self-pay

## 2022-07-13 NOTE — Telephone Encounter (Signed)
Error

## 2022-07-15 IMAGING — US US ABDOMEN LIMITED RUQ/ASCITES
1 series · 14 of 25 positions shown · non-contrast
Comparison: 11/21/2019

CLINICAL DATA: 49-year-old with right upper quadrant pain.

EXAM:
ULTRASOUND ABDOMEN LIMITED RIGHT UPPER QUADRANT

[Series 1: us abdomen limited ruq (liver/gb) · 14 of 48 slices shown]
[im 1/48]
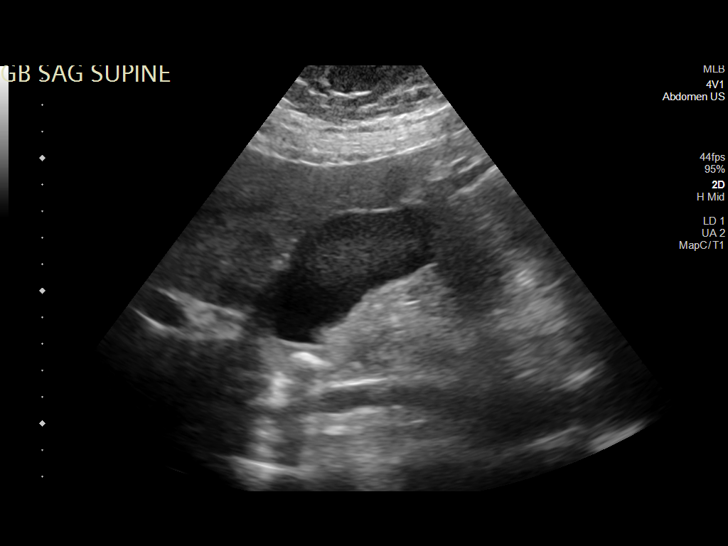
[im 4/48]
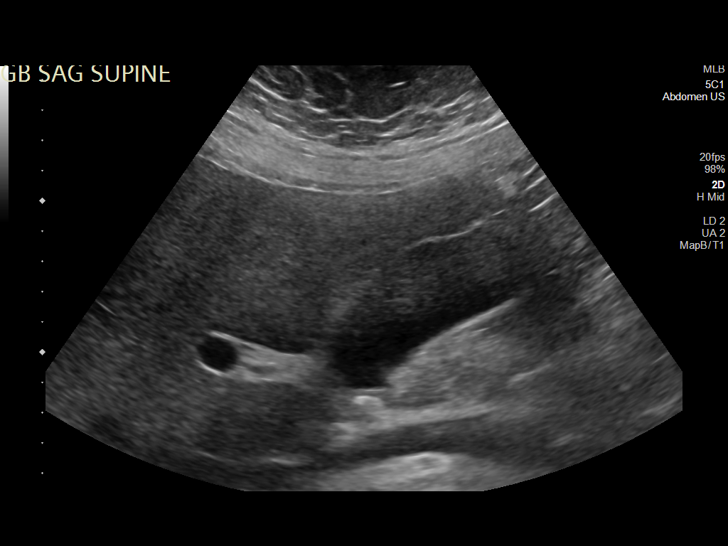
[im 8/48]
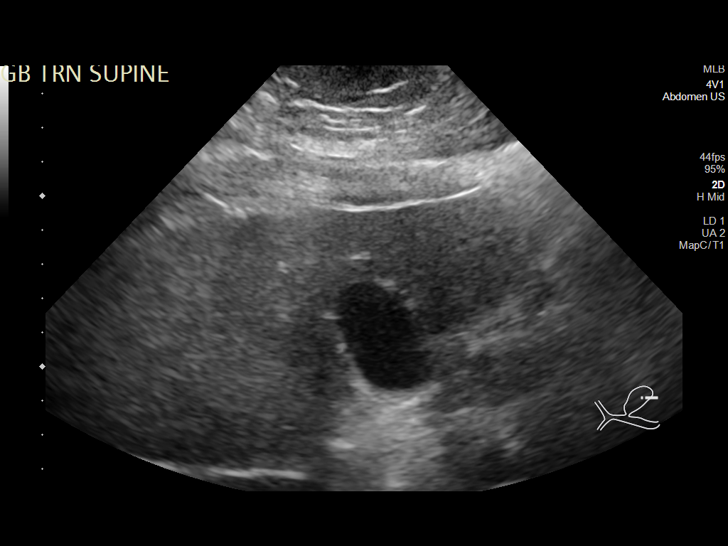
[im 12/48]
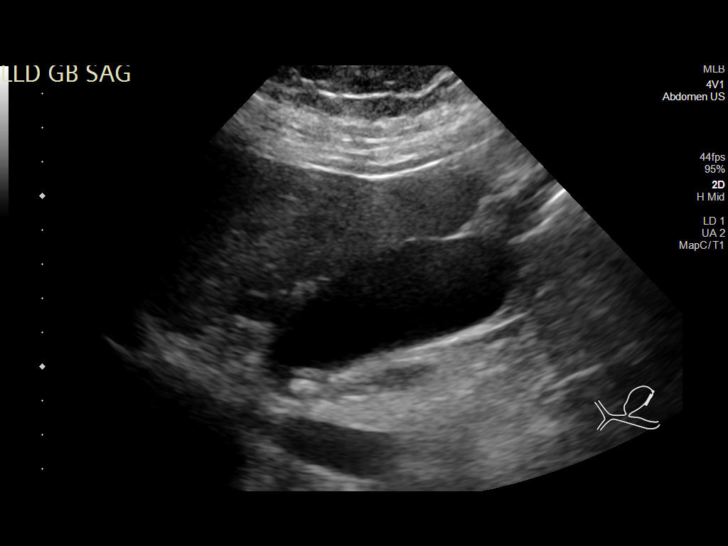
[im 16/48]
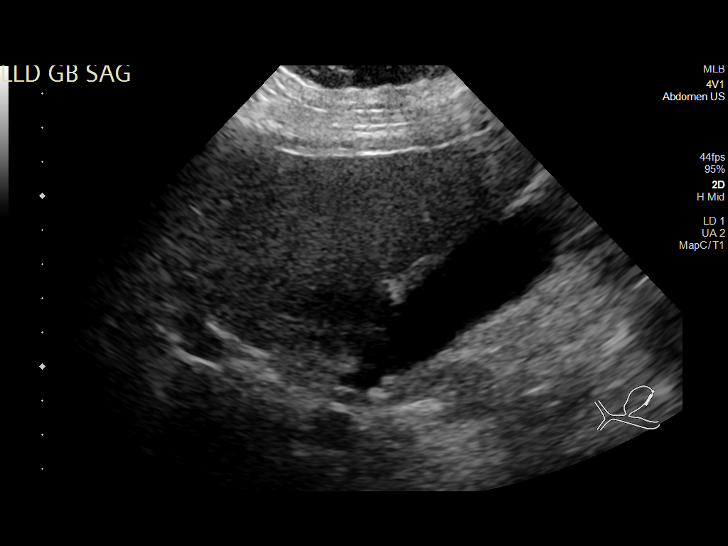
[im 18/48]
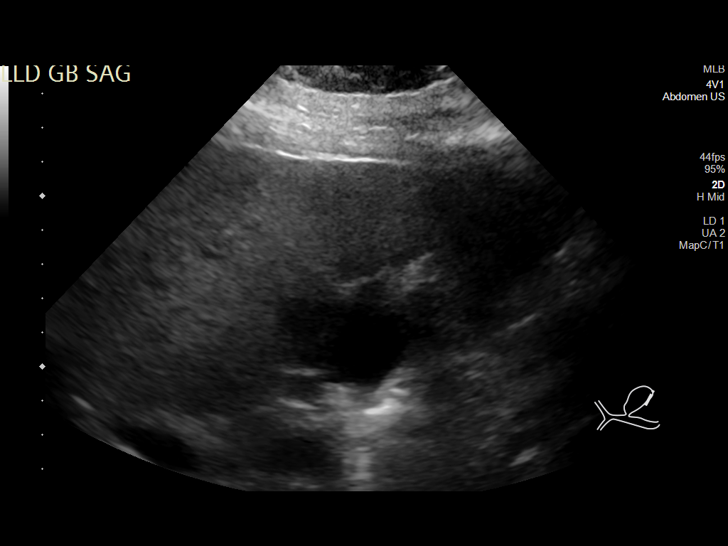
[im 22/48]
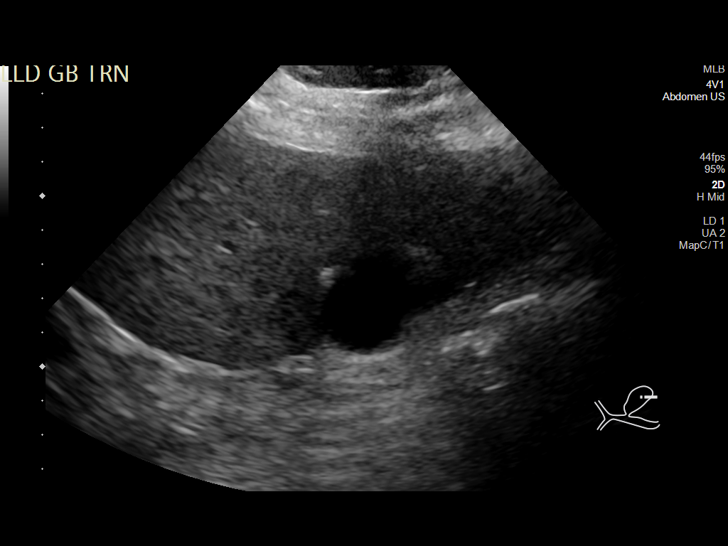
[im 26/48]
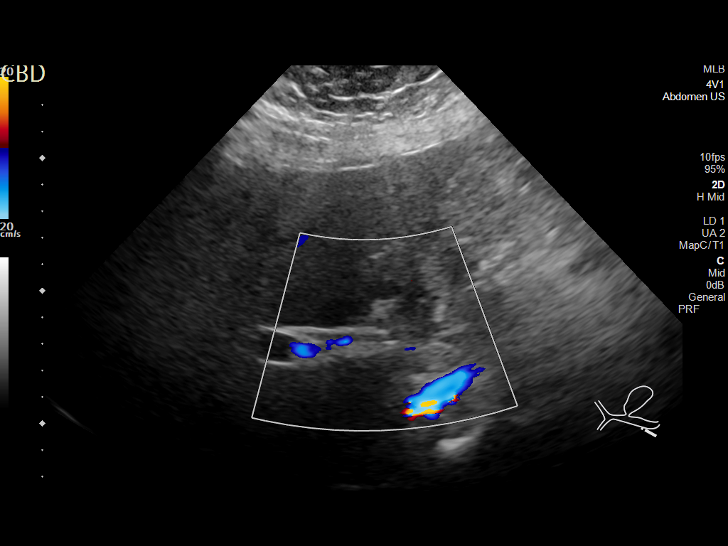
[im 30/48]
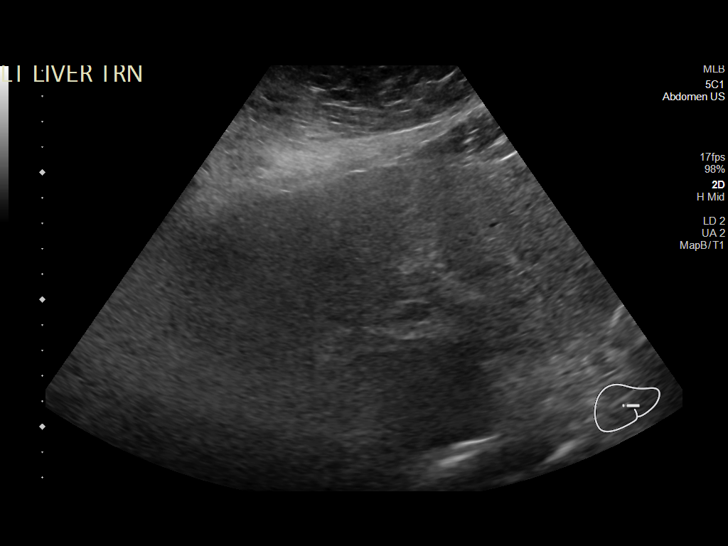
[im 32/48]
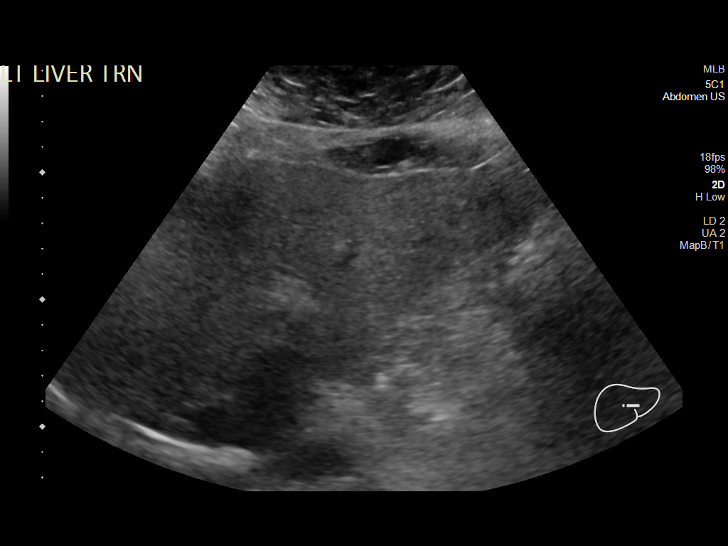
[im 36/48]
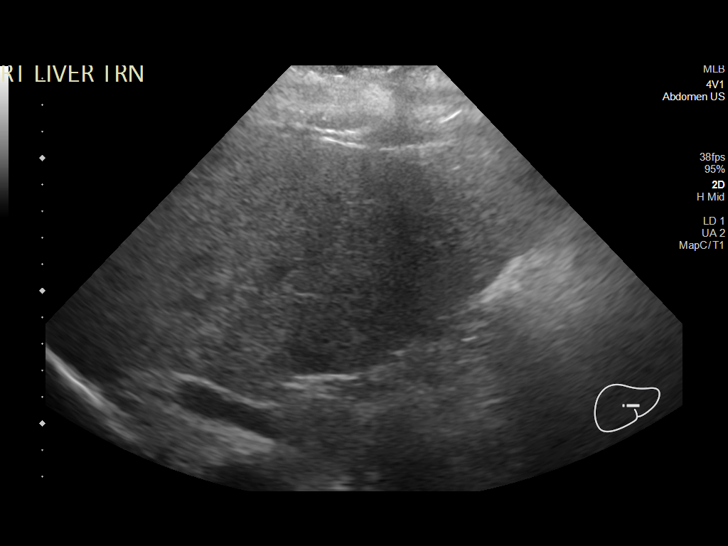
[im 40/48]
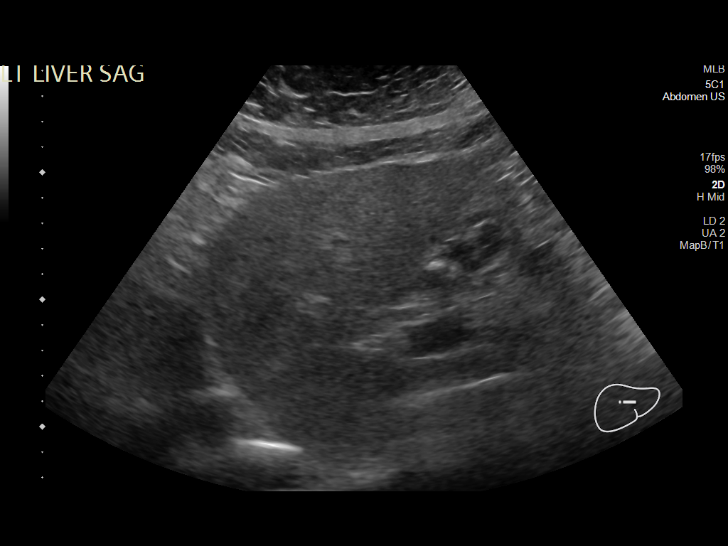
[im 44/48]
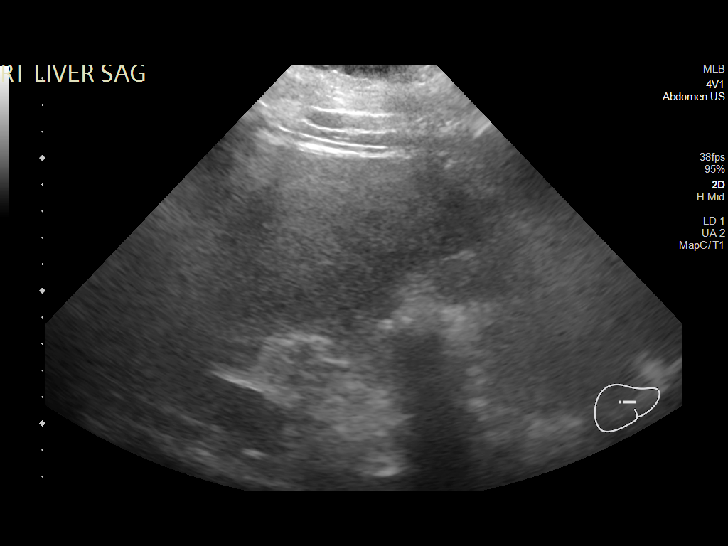
[im 48/48]
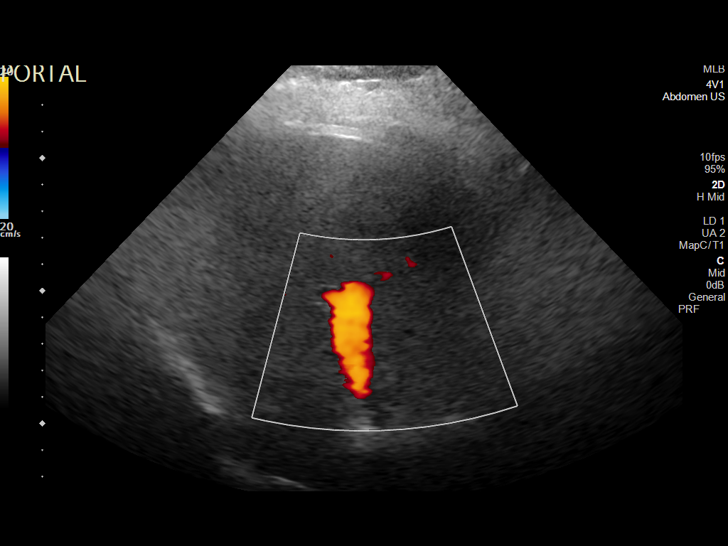

[14 of 25 positions shown; findings below may reference images not displayed]

FINDINGS: Gallbladder:

8 mm echogenic stone at the gallbladder base. No gallbladder wall
thickening. Reportedly, the patient does not have a sonographic
Murphy sign.

Common bile duct:

Diameter: 4 mm

Liver:

No focal lesion identified. Liver parenchyma is heterogeneous and
slightly increased. Portal vein is patent on color Doppler imaging
with normal direction of blood flow towards the liver.

Other: None.
IMPRESSION: 1. Cholelithiasis.  No evidence for acute cholecystitis.
2. Liver is heterogeneous and echogenic. Findings are suggestive for
steatosis. No biliary dilatation.

## 2022-08-11 ENCOUNTER — Telehealth: Payer: Self-pay | Admitting: Family Medicine

## 2022-08-11 NOTE — Telephone Encounter (Signed)
This is a records request form and has been forwarded to Medical records

## 2022-08-11 NOTE — Telephone Encounter (Signed)
Disability Determination Services  Fax [phone]  Placed in front bin

## 2022-08-18 ENCOUNTER — Other Ambulatory Visit: Payer: Self-pay | Admitting: Physical Medicine and Rehabilitation

## 2022-08-18 ENCOUNTER — Encounter
Payer: BC Managed Care – PPO | Attending: Physical Medicine and Rehabilitation | Admitting: Physical Medicine and Rehabilitation

## 2022-08-18 ENCOUNTER — Telehealth: Payer: Self-pay

## 2022-08-18 ENCOUNTER — Encounter: Payer: Self-pay | Admitting: Physical Medicine and Rehabilitation

## 2022-08-18 VITALS — BP 121/85 | HR 86 | Ht 66.0 in | Wt 249.0 lb

## 2022-08-18 DIAGNOSIS — J309 Allergic rhinitis, unspecified: Secondary | ICD-10-CM

## 2022-08-18 DIAGNOSIS — F5104 Psychophysiologic insomnia: Secondary | ICD-10-CM

## 2022-08-18 DIAGNOSIS — G6289 Other specified polyneuropathies: Secondary | ICD-10-CM | POA: Diagnosis not present

## 2022-08-18 DIAGNOSIS — R600 Localized edema: Secondary | ICD-10-CM | POA: Diagnosis not present

## 2022-08-18 DIAGNOSIS — G4701 Insomnia due to medical condition: Secondary | ICD-10-CM | POA: Insufficient documentation

## 2022-08-18 MED ORDER — TOPIRAMATE 25 MG PO TABS: 25.0000 mg | ORAL_TABLET | Freq: Every day | ORAL | 3 refills | Status: AC

## 2022-08-18 MED ORDER — HYDROXYZINE HCL 25 MG PO TABS
25.0000 mg | ORAL_TABLET | Freq: Four times a day (QID) | ORAL | 0 refills | Status: AC | PRN
Start: 2022-08-18 — End: ?

## 2022-08-18 MED ORDER — CAPSAICIN-CLEANSING GEL 8 % EX KIT
4.0000 | PACK | Freq: Once | CUTANEOUS | Status: AC
Start: 2022-08-18 — End: 2022-08-18
  Administered 2022-08-18: 4 via TOPICAL

## 2022-08-18 NOTE — Progress Notes (Signed)
Subjective:    Patient ID: Teresa Castaneda, female    DOB: [birth year], 52 y.o.   MRN: [id number]  HPI  Teresa Castaneda is a 52 year old woman who presents for f/u of neuropathy  1) Peripheral neuropathy -tramadol helps for 3 hours, she always takes two 50mg  tablets at a time. She notes that she contacted pharmacy yesterday and they told her that the reason the medication was denied was because it was ordered as 100mg  tablets.  -she would like to try Qutenza in the future if it is fully covered, it was helpful last time -pain is getting worse -she is taking 120 cymbalta -pain is 7/10 -neuropathy was not bad last year -now it is much more severe espcially when she stands on certain surfaces  2) Insomnia -sleeping poorly  -she works at night  3) right foot swelling -she is willing to get [place] -she takes lasix 20mg  daily  Pain Inventory Average Pain 5 Pain Right Now 7 My pain is sharp, burning, stabbing, tingling, and aching  In the last 24 hours, has pain interfered with the following? General activity 4 Relation with others 5 Enjoyment of life 4 What TIME of day is your pain at its worst? morning  Sleep (in general) Poor  Pain is worse with: walking, standing, and some activites Pain improves with: rest and medication Relief from Meds: 6  Family History  Problem Relation Age of Onset   Coronary artery disease Mother    Diabetes Mother    COPD Mother    Coronary artery disease Father    Hypertension Father    Colon cancer Neg Hx    Esophageal cancer Neg Hx    Social History   Socioeconomic History   Marital status: Married    Spouse name: Not on file   Number of children: Not on file   Years of education: Not on file   Highest education level: 9th grade  Occupational History   Occupation: Development worker, international aid  Tobacco Use   Smoking status: Some Days    Current packs/day: 0.00    Average packs/day: 0.5 packs/day for 34.7 years (17.4 ttl pk-yrs)    Types: Cigarettes     Start date: 01/16/1985    Last attempt to quit: 10/08/2019    Years since quitting: 2.8   Smokeless tobacco: Never   Tobacco comments:    7- 8 cig a week  Vaping Use   Vaping status: Never Used  Substance and Sexual Activity   Alcohol use: No   Drug use: No   Sexual activity: Yes    Birth control/protection: Surgical  Other Topics Concern   Not on file  Social History Narrative   Right Handed    Caffeine rare   Live with husband and mother in law in a two story hpme   Social Determinants of Health   Financial Resource Strain: Not on file  Food Insecurity: Food Insecurity Present (05/12/2022)   Hunger Vital Sign    Worried About Running Out of Food in the Last Year: Never true    Ran Out of Food in the Last Year: Sometimes true  Transportation Needs: No Transportation Needs (05/12/2022)   PRAPARE - Administrator, Civil Service (Medical): No    Lack of Transportation (Non-Medical): No  Physical Activity: Unknown (05/12/2022)   Exercise Vital Sign    Days of Exercise per Week: 1 day    Minutes of Exercise per Session: Patient declined  Stress: Stress  Concern Present (05/12/2022)   [employer] of Occupational Health - Occupational Stress Questionnaire    Feeling of Stress : Very much  Social Connections: Moderately Integrated (05/12/2022)   Social Connection and Isolation Panel [NHANES]    Frequency of Communication with Friends and Family: Twice a week    Frequency of Social Gatherings with Friends and Family: Twice a week    Attends Religious Services: More than 4 times per year    Active Member of [employer] or Organizations: No    Attends Engineer, structural: Not on file    Marital Status: Married   Past Surgical History:  Procedure Laterality Date   ABDOMINAL HYSTERECTOMY  02/21/2003   CHOLECYSTECTOMY N/A 05/10/2020   Procedure: LAPAROSCOPIC CHOLECYSTECTOMY WITH INTRAOPERATIVE CHOLANGIOGRAM AND LIVER BIOPSY;  Surgeon: Quentin Ore, MD;   Location: [facility];  Service: General;  Laterality: N/A;   DG SACROILIAC JOINTS ([facility] HX)  01/11/2021   [facility]   ENDOSCOPIC TURBINATE REDUCTION Bilateral 10/13/2020   Procedure: ENDOSCOPIC BILATERAL INFERIOR TURBINATE REDUCTIONS;  Surgeon: Drema Halon, MD;  Location: [facility];  Service: ENT;  Laterality: Bilateral;   ETHMOIDECTOMY Bilateral 10/13/2020   Procedure: BILATERAL TOTAL ETHMOIDECTOMY AND MAXILLARY OSTIA ENLARGEMENTS;  Surgeon: Drema Halon, MD;  Location: [facility];  Service: ENT;  Laterality: Bilateral;   LIVER BIOPSY     NASAL SINUS SURGERY Bilateral 10/13/2020   Procedure: SINUS ENDOSCOPY WITH STEALTH NAVIGATION;  Surgeon: Drema Halon, MD;  Location: [facility];  Service: ENT;  Laterality: Bilateral;   TONSILLECTOMY     TOTAL ABDOMINAL HYSTERECTOMY     Past Surgical History:  Procedure Laterality Date   ABDOMINAL HYSTERECTOMY  02/21/2003   CHOLECYSTECTOMY N/A 05/10/2020   Procedure: LAPAROSCOPIC CHOLECYSTECTOMY WITH INTRAOPERATIVE CHOLANGIOGRAM AND LIVER BIOPSY;  Surgeon: Quentin Ore, MD;  Location: [facility];  Service: General;  Laterality: N/A;   DG SACROILIAC JOINTS ([facility] HX)  01/11/2021   [facility]   ENDOSCOPIC TURBINATE REDUCTION Bilateral 10/13/2020   Procedure: ENDOSCOPIC BILATERAL INFERIOR TURBINATE REDUCTIONS;  Surgeon: Drema Halon, MD;  Location: [facility];  Service: ENT;  Laterality: Bilateral;   ETHMOIDECTOMY Bilateral 10/13/2020   Procedure: BILATERAL TOTAL ETHMOIDECTOMY AND MAXILLARY OSTIA ENLARGEMENTS;  Surgeon: Drema Halon, MD;  Location: [facility];  Service: ENT;  Laterality: Bilateral;   LIVER BIOPSY     NASAL SINUS SURGERY Bilateral 10/13/2020   Procedure: SINUS ENDOSCOPY WITH STEALTH NAVIGATION;  Surgeon: Drema Halon, MD;  Location: [facility];  Service: ENT;  Laterality:  Bilateral;   TONSILLECTOMY     TOTAL ABDOMINAL HYSTERECTOMY     Past Medical History:  Diagnosis Date   Allergies    Anxiety    Arthritis    Asthma    Back pain    Spinal Issue    Diabetes mellitus without complication (HCC)    DJD (degenerative joint disease), lumbar    Elevated cholesterol    Gallstone    GERD (gastroesophageal reflux disease)    Liver cirrhosis secondary to NASH (HCC)    Neuromuscular disorder (HCC)    Obesity    Oxygen deficiency    Thyroid disease    Ht 5\' 6"  (1.676 m)   BMI 40.27 kg/m   Opioid Risk Score:   Fall Risk Score:  `1  Depression screen [facility] 2/9     05/18/2022    9:58 AM 04/19/2022    8:32 AM 04/19/2022  8:31 AM 11/11/2021   10:54 AM 11/04/2021    8:06 AM 05/02/2021    8:47 AM 03/02/2021   11:16 AM  Depression screen PHQ 2/9  Decreased Interest 1 1 0 1 1 2 1   Down, Depressed, Hopeless 1 1 0 3 2 1 1   PHQ - 2 Score 2 2 0 4 3 3 2   Altered sleeping  3  1 3 2 2   Tired, decreased energy  3  3 3 2 2   Change in appetite  2  2 1 1  0  Feeling bad or failure about yourself   1  2 0 1 0  Trouble concentrating  1  1 2 1 1   Moving slowly or fidgety/restless  1  0 0 2 0  Suicidal thoughts  0  0 0 0 0  PHQ-9 Score  13  13 12 12 7   Difficult doing work/chores  Somewhat difficult  Very difficult         Review of Systems  Musculoskeletal:        Bilateral foot pain  All other systems reviewed and are negative.      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  -increase Tramadol to 100mg  q6H prn, have sent in new script today as patient was told it was denied yesterday due to the request for 100mg  tablets  Prescribed Zynex Nexwave, trialed Nexwave in office and it showed good benefit  -prescribed topamax 25mg  HS  -discussed benefits of heat in improving blood flow  -Discussed Qutenza as an option for neuropathic pain control. Discussed that this is a capsaicin patch, stronger than capsaicin cream. Discussed that it is currently approved for diabetic peripheral neuropathy and post-herpetic neuralgia, but that it has also shown benefit in treating other forms of neuropathy. Provided patient with link to site to learn more about the patch: https://www.clark.biz/. Discussed that the patch would be placed in office and benefits usually last 3 months. Discussed that unintended exposure to capsaicin can cause severe irritation of eyes, mucous membranes, respiratory tract, and skin, but that Qutenza is a local treatment and does not have the systemic side effects of other nerve medications. Discussed that there may be pain, itching, erythema, and decreased sensory function associated with the application of Qutenza. Side effects usually subside within 1 week. A cold pack of analgesic medications can help with these side effects. Blood pressure can also be increased due to pain associated with administration of the patch.   4 patches of Qutenza was applied to the area of pain. Ice packs were applied during the procedure to ensure patient comfort. Blood pressure was monitored every 15 minutes. The patient tolerated the procedure well. Post-procedure instructions were given and follow-up has been scheduled.    -discussed positive response to last Qutenza treatment   -discussed that we will try again for Qutenza next visit if this can be covered fully for her -Discussed benefits of exercise in reducing pain. -Discussed following foods that may  reduce pain: 1) Ginger (especially  studied for arthritis)- reduce leukotriene production to decrease inflammation 2) Blueberries- high in phytonutrients that decrease inflammation 3) Salmon- marine omega-3s reduce joint swelling and pain 4) Pumpkin seeds- reduce inflammation 5) dark chocolate- reduces inflammation 6) turmeric- reduces inflammation 7) tart cherries - reduce pain and stiffness 8) extra virgin olive oil - its compound olecanthal helps to block prostaglandins  9) chili peppers- can be eaten or applied topically via capsaicin 10) mint- helpful for headache, muscle aches, joint pain, and itching 11) garlic- reduces inflammation  Link to further information on diet for chronic pain: http://www.bray.com/   2) Insomnia: -prescribed topamax 25mg  HS .Prescribed Zynex Nexwave heating/cooling blanket  -Try to go outside near sunrise -Get exercise during the day.  -Turn off all devices an hour before bedtime.  -Teas that can benefit: chamomile, valerian root, Brahmi (Bacopa) -Can consider over the counter melatonin, magnesium, and/or L-theanine. Melatonin is an anti-oxidant with multiple health benefits. Magnesium is involved in greater than 300 enzymatic reactions in the body and most of [place] are deficient as our soil is often depleted. There are 7 different types of magnesium- Bioptemizer's is a supplement with all 7 types, and each has unique benefits. Magnesium can also help with constipation and anxiety.  -Pistachios naturally increase the production of melatonin -Cozy Earth bamboo bed sheets are free from toxic chemicals.  -Tart cherry juice or a tart cherry supplement can improve sleep and soreness post-workout  3) Right lower extremity swelling -vascular US ordered -advised to take 2 lasix today -provided note for work  4) Morbid obesity: -commended on 12 lb weight loss since last visit! -discussed that  tirzepatide appears to cause more weight loss than semaglutide -prescribed topamax 25mg  HS  40 minutes spent in discussion of risks and benefits of Qutenza and obtaining informed consent, discussion of q90 day follow-up and expectation of improvement in pain with each repeat application, commended on 12 lb weight loss since last viist, discussed that tirzepatide appears to cause more weight loss than semaglutide, trialed Nexwave in office and it showed good benefit, documenting visit, prescribed topamax for insomnia/morbid obesity/neuropathy, discussed positive response to last Qutenza treatment, discussed benefits of heat for improving blood flow

## 2022-08-18 NOTE — Telephone Encounter (Signed)
Per Dr. Carlis Abbott call in Hydroxyzine 25 one every 6 hours #30.Patient notified. [facility]

## 2022-08-18 NOTE — Telephone Encounter (Signed)
Patient called stating her feet are burning on the top where the gel was put on. She has tried ice, cold water and claritin. Advise please

## 2022-08-23 DIAGNOSIS — M549 Dorsalgia, unspecified: Secondary | ICD-10-CM | POA: Diagnosis not present

## 2022-08-23 DIAGNOSIS — G9009 Other idiopathic peripheral autonomic neuropathy: Secondary | ICD-10-CM | POA: Diagnosis not present

## 2022-08-24 DIAGNOSIS — M549 Dorsalgia, unspecified: Secondary | ICD-10-CM | POA: Diagnosis not present

## 2022-08-24 DIAGNOSIS — G9009 Other idiopathic peripheral autonomic neuropathy: Secondary | ICD-10-CM | POA: Diagnosis not present

## 2022-08-25 DIAGNOSIS — M549 Dorsalgia, unspecified: Secondary | ICD-10-CM | POA: Diagnosis not present

## 2022-08-25 DIAGNOSIS — G9009 Other idiopathic peripheral autonomic neuropathy: Secondary | ICD-10-CM | POA: Diagnosis not present

## 2022-08-26 DIAGNOSIS — G9009 Other idiopathic peripheral autonomic neuropathy: Secondary | ICD-10-CM | POA: Diagnosis not present

## 2022-08-26 DIAGNOSIS — M549 Dorsalgia, unspecified: Secondary | ICD-10-CM | POA: Diagnosis not present

## 2022-08-27 DIAGNOSIS — M549 Dorsalgia, unspecified: Secondary | ICD-10-CM | POA: Diagnosis not present

## 2022-08-27 DIAGNOSIS — G9009 Other idiopathic peripheral autonomic neuropathy: Secondary | ICD-10-CM | POA: Diagnosis not present

## 2022-08-28 DIAGNOSIS — M549 Dorsalgia, unspecified: Secondary | ICD-10-CM | POA: Diagnosis not present

## 2022-08-28 DIAGNOSIS — G9009 Other idiopathic peripheral autonomic neuropathy: Secondary | ICD-10-CM | POA: Diagnosis not present

## 2022-08-29 DIAGNOSIS — G9009 Other idiopathic peripheral autonomic neuropathy: Secondary | ICD-10-CM | POA: Diagnosis not present

## 2022-08-29 DIAGNOSIS — M549 Dorsalgia, unspecified: Secondary | ICD-10-CM | POA: Diagnosis not present

## 2022-08-30 DIAGNOSIS — M549 Dorsalgia, unspecified: Secondary | ICD-10-CM | POA: Diagnosis not present

## 2022-08-30 DIAGNOSIS — G9009 Other idiopathic peripheral autonomic neuropathy: Secondary | ICD-10-CM | POA: Diagnosis not present

## 2022-08-31 DIAGNOSIS — M549 Dorsalgia, unspecified: Secondary | ICD-10-CM | POA: Diagnosis not present

## 2022-08-31 DIAGNOSIS — G9009 Other idiopathic peripheral autonomic neuropathy: Secondary | ICD-10-CM | POA: Diagnosis not present

## 2022-09-01 DIAGNOSIS — M549 Dorsalgia, unspecified: Secondary | ICD-10-CM | POA: Diagnosis not present

## 2022-09-01 DIAGNOSIS — G9009 Other idiopathic peripheral autonomic neuropathy: Secondary | ICD-10-CM | POA: Diagnosis not present

## 2022-09-02 DIAGNOSIS — M549 Dorsalgia, unspecified: Secondary | ICD-10-CM | POA: Diagnosis not present

## 2022-09-02 DIAGNOSIS — G9009 Other idiopathic peripheral autonomic neuropathy: Secondary | ICD-10-CM | POA: Diagnosis not present

## 2022-09-03 DIAGNOSIS — G9009 Other idiopathic peripheral autonomic neuropathy: Secondary | ICD-10-CM | POA: Diagnosis not present

## 2022-09-03 DIAGNOSIS — M549 Dorsalgia, unspecified: Secondary | ICD-10-CM | POA: Diagnosis not present

## 2022-09-04 DIAGNOSIS — G9009 Other idiopathic peripheral autonomic neuropathy: Secondary | ICD-10-CM | POA: Diagnosis not present

## 2022-09-04 DIAGNOSIS — M549 Dorsalgia, unspecified: Secondary | ICD-10-CM | POA: Diagnosis not present

## 2022-09-05 DIAGNOSIS — M549 Dorsalgia, unspecified: Secondary | ICD-10-CM | POA: Diagnosis not present

## 2022-09-05 DIAGNOSIS — G9009 Other idiopathic peripheral autonomic neuropathy: Secondary | ICD-10-CM | POA: Diagnosis not present

## 2022-09-06 DIAGNOSIS — M549 Dorsalgia, unspecified: Secondary | ICD-10-CM | POA: Diagnosis not present

## 2022-09-06 DIAGNOSIS — G9009 Other idiopathic peripheral autonomic neuropathy: Secondary | ICD-10-CM | POA: Diagnosis not present

## 2022-09-07 DIAGNOSIS — M549 Dorsalgia, unspecified: Secondary | ICD-10-CM | POA: Diagnosis not present

## 2022-09-07 DIAGNOSIS — G9009 Other idiopathic peripheral autonomic neuropathy: Secondary | ICD-10-CM | POA: Diagnosis not present

## 2022-09-08 DIAGNOSIS — M549 Dorsalgia, unspecified: Secondary | ICD-10-CM | POA: Diagnosis not present

## 2022-09-08 DIAGNOSIS — G9009 Other idiopathic peripheral autonomic neuropathy: Secondary | ICD-10-CM | POA: Diagnosis not present

## 2022-09-09 DIAGNOSIS — G9009 Other idiopathic peripheral autonomic neuropathy: Secondary | ICD-10-CM | POA: Diagnosis not present

## 2022-09-09 DIAGNOSIS — M549 Dorsalgia, unspecified: Secondary | ICD-10-CM | POA: Diagnosis not present

## 2022-09-11 DIAGNOSIS — G9009 Other idiopathic peripheral autonomic neuropathy: Secondary | ICD-10-CM | POA: Diagnosis not present

## 2022-09-11 DIAGNOSIS — M549 Dorsalgia, unspecified: Secondary | ICD-10-CM | POA: Diagnosis not present

## 2022-09-12 DIAGNOSIS — G9009 Other idiopathic peripheral autonomic neuropathy: Secondary | ICD-10-CM | POA: Diagnosis not present

## 2022-09-12 DIAGNOSIS — M549 Dorsalgia, unspecified: Secondary | ICD-10-CM | POA: Diagnosis not present

## 2022-09-13 DIAGNOSIS — M549 Dorsalgia, unspecified: Secondary | ICD-10-CM | POA: Diagnosis not present

## 2022-09-13 DIAGNOSIS — G9009 Other idiopathic peripheral autonomic neuropathy: Secondary | ICD-10-CM | POA: Diagnosis not present

## 2022-09-17 DIAGNOSIS — M549 Dorsalgia, unspecified: Secondary | ICD-10-CM | POA: Diagnosis not present

## 2022-09-17 DIAGNOSIS — G9009 Other idiopathic peripheral autonomic neuropathy: Secondary | ICD-10-CM | POA: Diagnosis not present

## 2022-09-18 DIAGNOSIS — M549 Dorsalgia, unspecified: Secondary | ICD-10-CM | POA: Diagnosis not present

## 2022-09-18 DIAGNOSIS — G9009 Other idiopathic peripheral autonomic neuropathy: Secondary | ICD-10-CM | POA: Diagnosis not present

## 2022-09-19 DIAGNOSIS — M549 Dorsalgia, unspecified: Secondary | ICD-10-CM | POA: Diagnosis not present

## 2022-09-19 DIAGNOSIS — G9009 Other idiopathic peripheral autonomic neuropathy: Secondary | ICD-10-CM | POA: Diagnosis not present

## 2022-09-20 DIAGNOSIS — M549 Dorsalgia, unspecified: Secondary | ICD-10-CM | POA: Diagnosis not present

## 2022-09-20 DIAGNOSIS — G9009 Other idiopathic peripheral autonomic neuropathy: Secondary | ICD-10-CM | POA: Diagnosis not present

## 2022-09-20 IMAGING — MR MR LUMBAR SPINE W/O CM
4 of 8 series · 17 of 48 positions shown · non-contrast
Comparison: CT scan 06/07/2020

CLINICAL DATA: Chronic back pain.

EXAM:
MRI LUMBAR SPINE WITHOUT CONTRAST
TECHNIQUE: Multiplanar, multisequence MR imaging of the lumbar spine was
performed. No intravenous contrast was administered.

[Series 6: T2 · sagittal · 4.0mm · 0.73mm/px · 3 of 15 slices shown (1 of 3)]
[im 1/15]
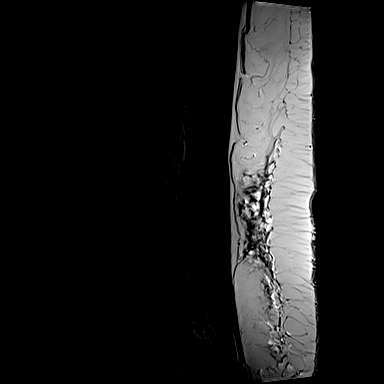
[im 8/15]
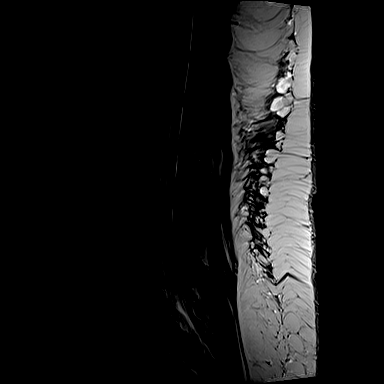
[im 15/15]
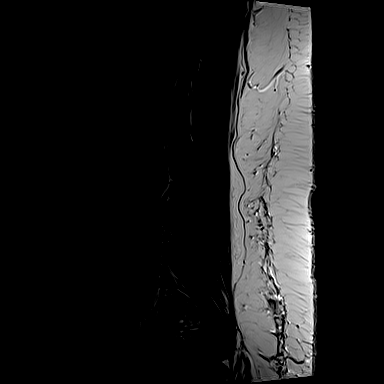

[Series 7: T1 · sagittal · 4.0mm · 0.73mm/px · 3 of 15 slices shown]
[im 1/15]
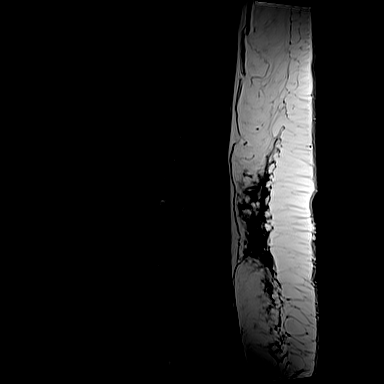
[im 8/15]
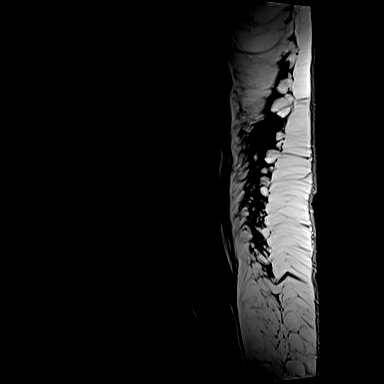
[im 15/15]
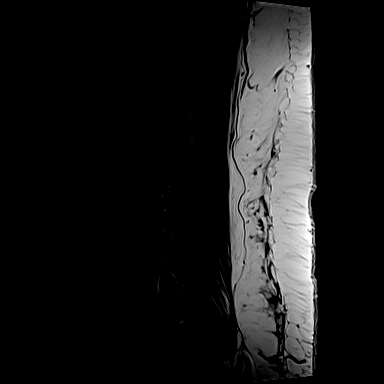

[Series 12: T2 · axial · 4.0mm · 0.28mm/px · z∈[+15,+110]mm · 5 of 20 slices shown (2 of 3)]
[im 1/20]
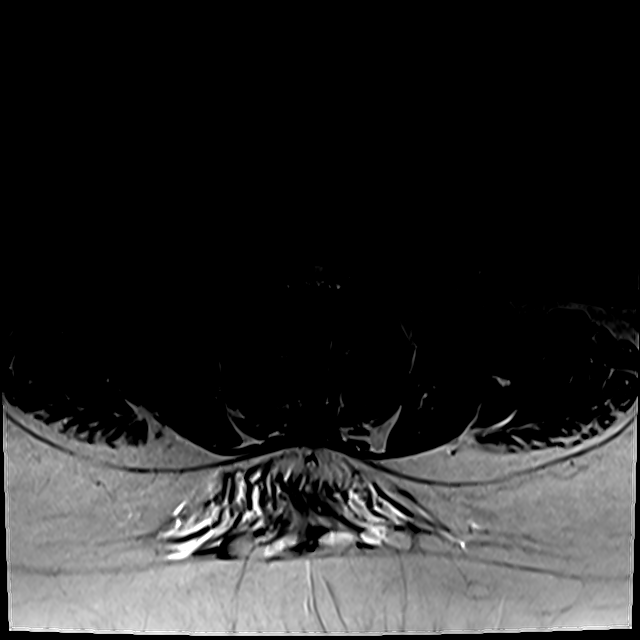
[im 5/20]
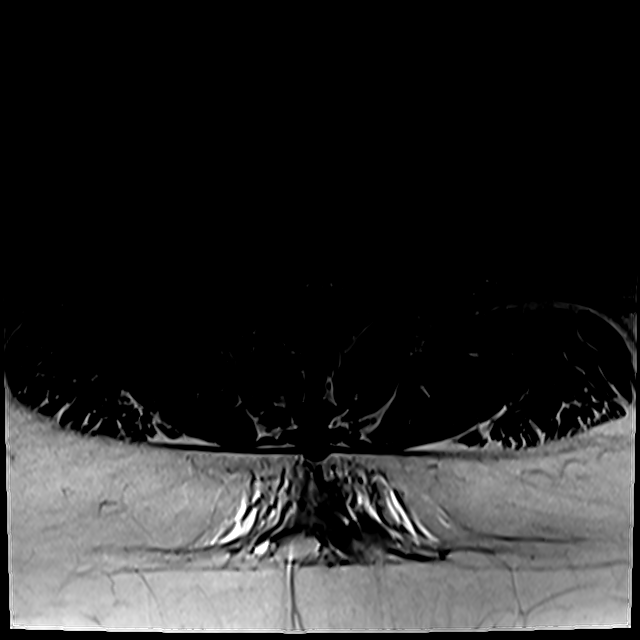
[im 10/20]
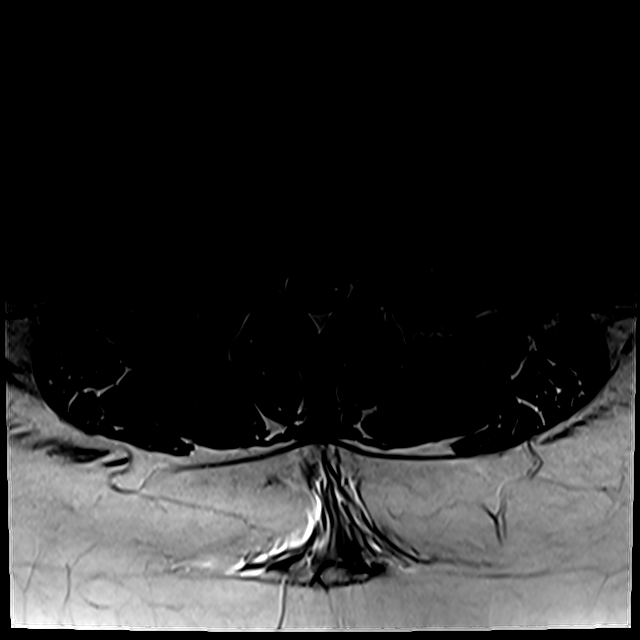
[im 15/20]
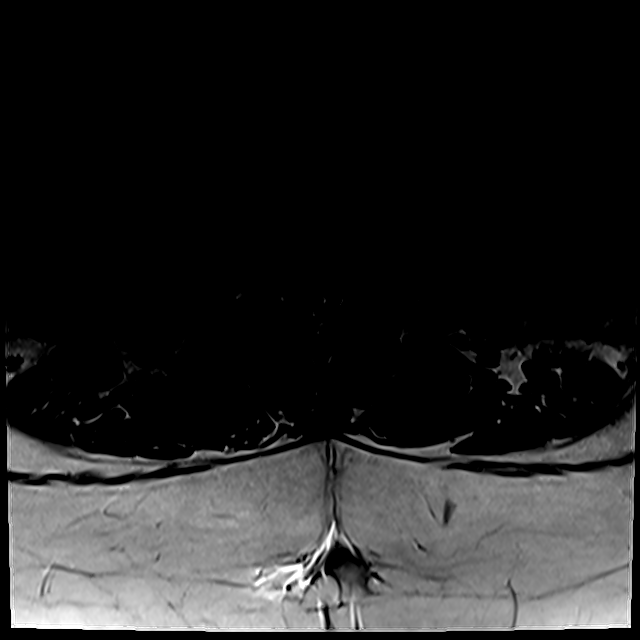
[im 20/20]
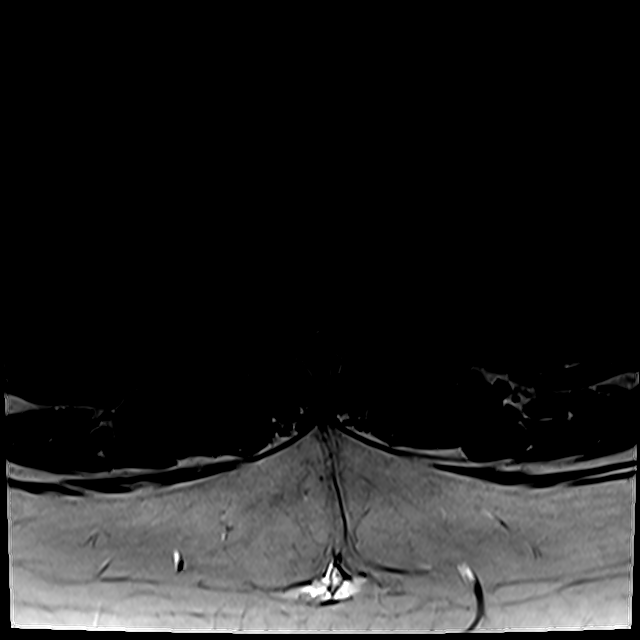

[Series 14: T2 · axial · 4.0mm · 0.28mm/px · z∈[-105,+85]mm · 6 of 42 slices shown (3 of 3)]
[im 1/42]
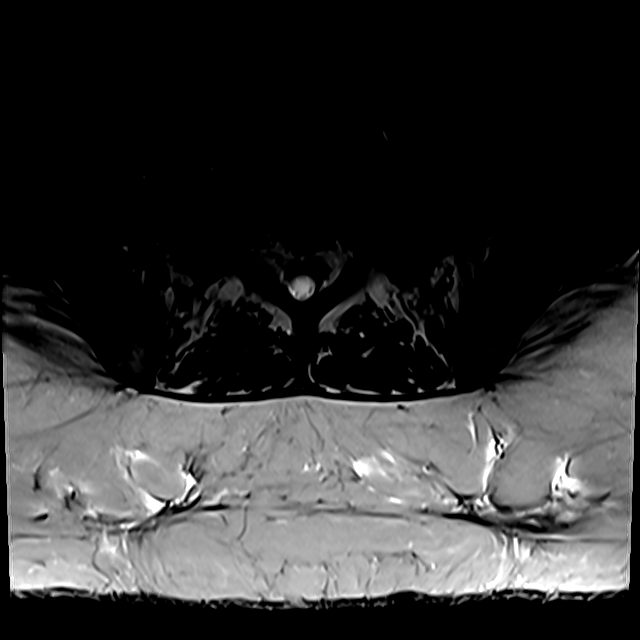
[im 5/42]
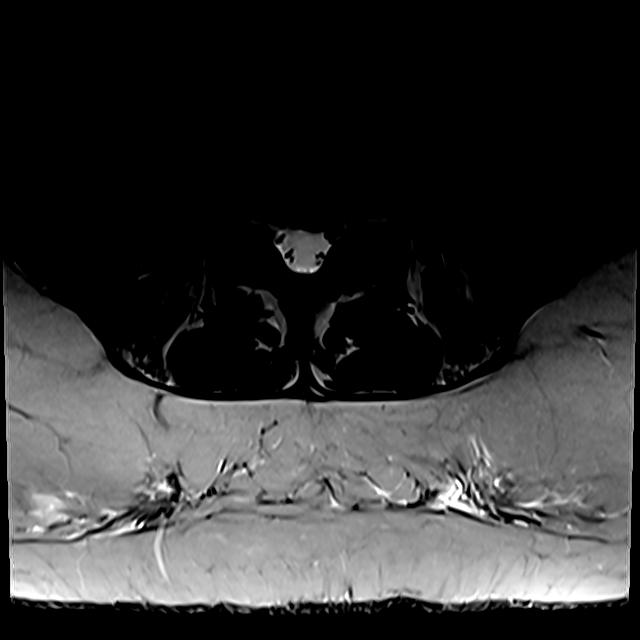
[im 9/42]
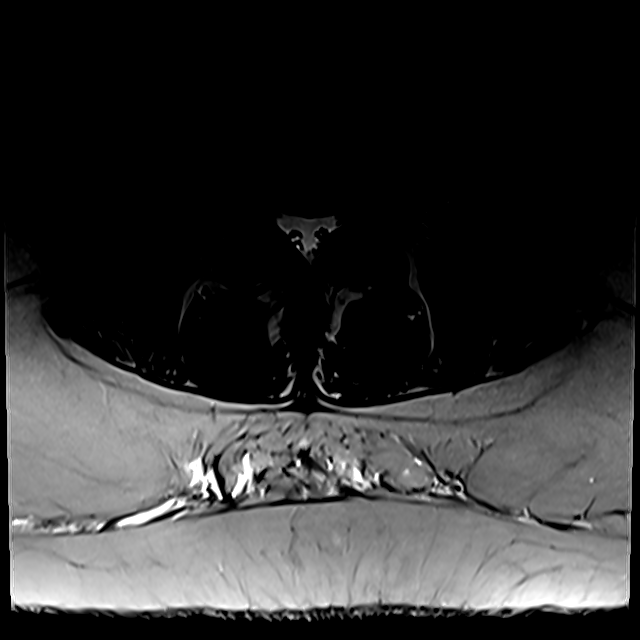
[im 13/42]
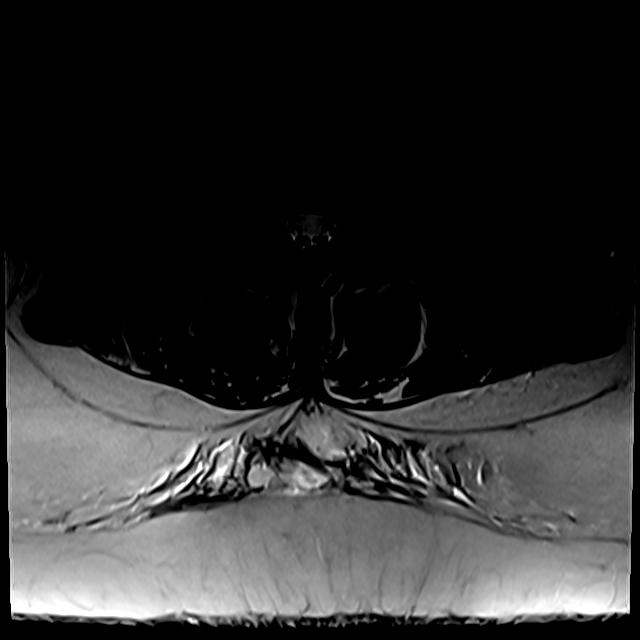
[im 21/42]
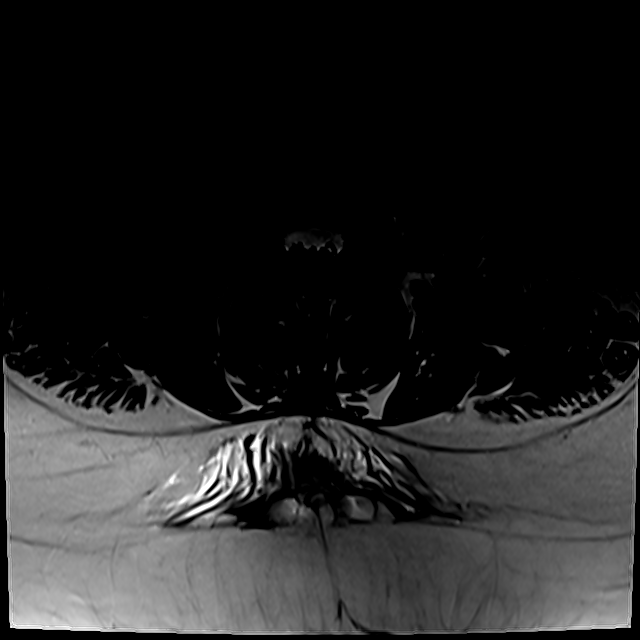
[im 37/42]
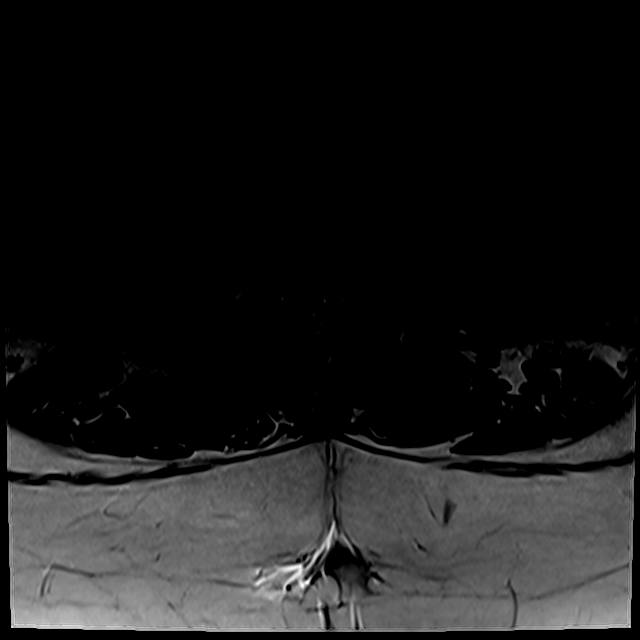

[17 of 48 positions shown; findings below may reference images not displayed]

FINDINGS: Segmentation: There are five lumbar type vertebral bodies. The last
full intervertebral disc space is labeled L5-S1.

Alignment:  Normal

Vertebrae:  Normal marrow signal.  No bone lesions or fractures.

Conus medullaris and cauda equina: Conus extends to the L1 level.
Conus and cauda equina appear normal.

Paraspinal and other soft tissues: No significant paraspinal or
retroperitoneal findings.

Disc levels:

No lumbar disc protrusions, spinal or foraminal stenosis. No
significant lumbar facet disease. No pars defects.
IMPRESSION: Unremarkable lumbar spine MRI examination.

## 2022-09-21 DIAGNOSIS — G9009 Other idiopathic peripheral autonomic neuropathy: Secondary | ICD-10-CM | POA: Diagnosis not present

## 2022-09-21 DIAGNOSIS — M549 Dorsalgia, unspecified: Secondary | ICD-10-CM | POA: Diagnosis not present

## 2022-09-22 DIAGNOSIS — M549 Dorsalgia, unspecified: Secondary | ICD-10-CM | POA: Diagnosis not present

## 2022-09-22 DIAGNOSIS — G9009 Other idiopathic peripheral autonomic neuropathy: Secondary | ICD-10-CM | POA: Diagnosis not present

## 2022-09-23 DIAGNOSIS — G9009 Other idiopathic peripheral autonomic neuropathy: Secondary | ICD-10-CM | POA: Diagnosis not present

## 2022-09-23 DIAGNOSIS — M549 Dorsalgia, unspecified: Secondary | ICD-10-CM | POA: Diagnosis not present

## 2022-09-24 DIAGNOSIS — M549 Dorsalgia, unspecified: Secondary | ICD-10-CM | POA: Diagnosis not present

## 2022-09-24 DIAGNOSIS — G9009 Other idiopathic peripheral autonomic neuropathy: Secondary | ICD-10-CM | POA: Diagnosis not present

## 2022-09-25 DIAGNOSIS — G9009 Other idiopathic peripheral autonomic neuropathy: Secondary | ICD-10-CM | POA: Diagnosis not present

## 2022-09-25 DIAGNOSIS — M549 Dorsalgia, unspecified: Secondary | ICD-10-CM | POA: Diagnosis not present

## 2022-09-26 DIAGNOSIS — G9009 Other idiopathic peripheral autonomic neuropathy: Secondary | ICD-10-CM | POA: Diagnosis not present

## 2022-09-26 DIAGNOSIS — M549 Dorsalgia, unspecified: Secondary | ICD-10-CM | POA: Diagnosis not present

## 2022-09-27 ENCOUNTER — Telehealth: Payer: Self-pay

## 2022-09-27 ENCOUNTER — Other Ambulatory Visit: Payer: Self-pay | Admitting: Physical Medicine and Rehabilitation

## 2022-09-27 ENCOUNTER — Encounter: Payer: Self-pay | Admitting: Physical Medicine and Rehabilitation

## 2022-09-27 DIAGNOSIS — G9009 Other idiopathic peripheral autonomic neuropathy: Secondary | ICD-10-CM | POA: Diagnosis not present

## 2022-09-27 DIAGNOSIS — E1140 Type 2 diabetes mellitus with diabetic neuropathy, unspecified: Secondary | ICD-10-CM

## 2022-09-27 DIAGNOSIS — M549 Dorsalgia, unspecified: Secondary | ICD-10-CM | POA: Diagnosis not present

## 2022-09-27 MED ORDER — QUTENZA (4 PATCH) 8 % EX KIT
4.0000 | PACK | Freq: Once | CUTANEOUS | 0 refills | Status: AC
Start: 2022-09-27 — End: 2022-09-27

## 2022-09-27 MED ORDER — TRAMADOL HCL 50 MG PO TABS: 100.0000 mg | ORAL_TABLET | Freq: Four times a day (QID) | ORAL | 0 refills | Status: DC | PRN

## 2022-09-27 NOTE — Telephone Encounter (Signed)
Patches reordered sent to specialty pharmacy

## 2022-09-28 DIAGNOSIS — G9009 Other idiopathic peripheral autonomic neuropathy: Secondary | ICD-10-CM | POA: Diagnosis not present

## 2022-09-28 DIAGNOSIS — M549 Dorsalgia, unspecified: Secondary | ICD-10-CM | POA: Diagnosis not present

## 2022-09-28 IMAGING — XA DG FLUORO GUIDE SPINAL/SI JT INJ*R*
1 series · 1 of 1 positions shown · non-contrast
Comparison: none

CLINICAL DATA: Sacroiliitis. Good response to previous right SI
joint injections with recurrence of symptoms.

[Series 1: ortho adipose · 1 of 1 slices shown]
[im 1/1]
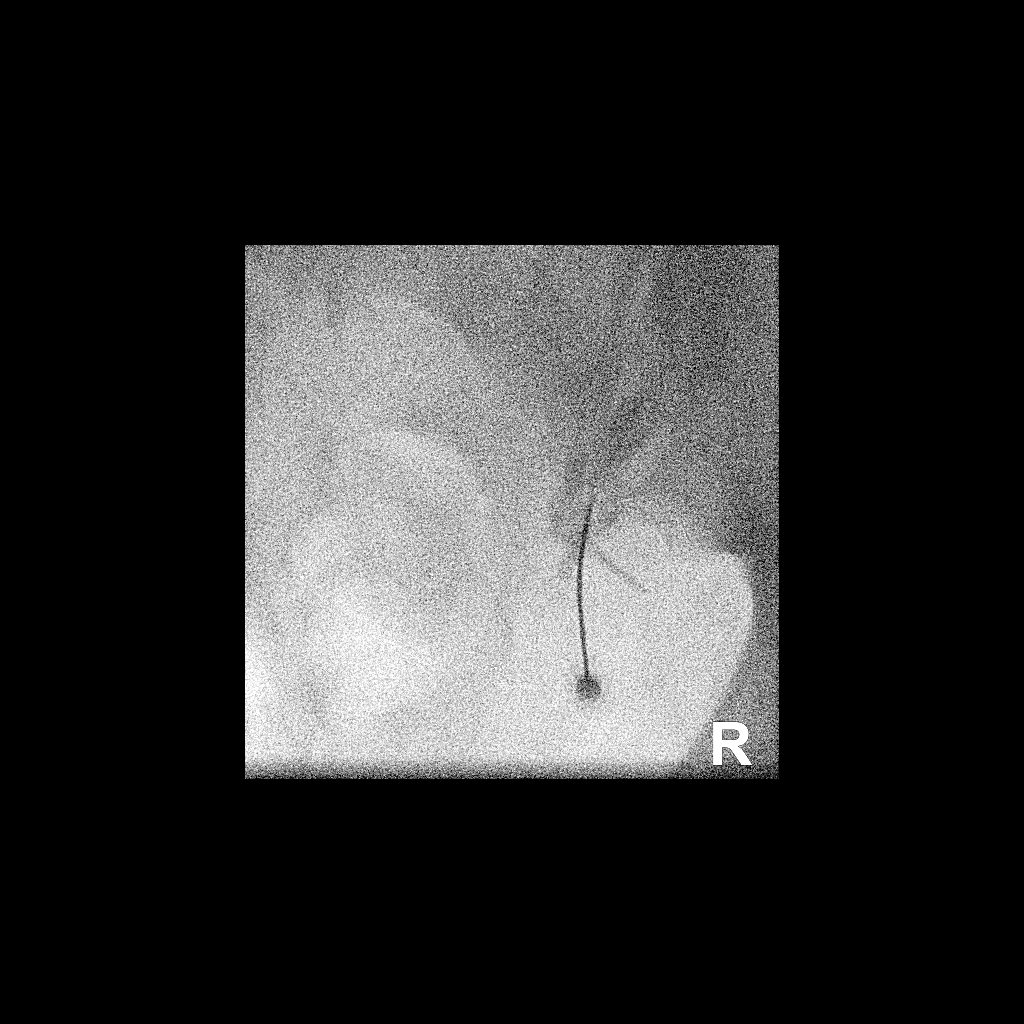

[1 of 1 positions shown; findings below may reference images not displayed]

FLUOROSCOPY TIME:  Fluoroscopy Time: 39 seconds

Radiation Exposure Index: 53.43 microGray*m^2

PROCEDURE:
RIGHT SI JOINT INJECTION UNDER FLUOROSCOPIC GUIDANCE

After a thorough discussion of risks and benefits of the procedure,
including bleeding, infection, injury to nerves, blood vessels, and
adjacent structures, verbal and written consent was obtained. The
patient was placed prone on the fluoroscopy table and localization
was performed over the sacrum. Target site marked using fluoroscopic
guidance. The skin was prepped and draped in the usual sterile
fashion using Betadine soap.

After local anesthesia with 1% lidocaine without epinephrine and
subsequent deep anesthesia, a 5 inch 22 gauge spinal needle was
advanced into the right SI joint. Injection of 0.5 ml Isovue-M 200
confirmed intra-articular placement. No vascular uptake present.
Subsequently, 80 mg of Depo-Medrol mixed with 1 mL of 0.25%
bupivacaine were injected into the right SI joint. The needle was
removed and a sterile dressing applied.

No complications were observed. The patient was observed and
released under the care of a driver after 30 minutes.
IMPRESSION: Successful fluoroscopically guided right SI joint injection.

## 2022-09-30 ENCOUNTER — Other Ambulatory Visit: Payer: Self-pay | Admitting: Pulmonary Disease

## 2022-09-30 DIAGNOSIS — J4540 Moderate persistent asthma, uncomplicated: Secondary | ICD-10-CM

## 2022-10-08 ENCOUNTER — Other Ambulatory Visit: Payer: Self-pay | Admitting: Family Medicine

## 2022-10-08 DIAGNOSIS — E1165 Type 2 diabetes mellitus with hyperglycemia: Secondary | ICD-10-CM

## 2022-10-09 ENCOUNTER — Other Ambulatory Visit: Payer: Self-pay

## 2022-10-09 ENCOUNTER — Telehealth: Payer: Self-pay | Admitting: Family Medicine

## 2022-10-09 DIAGNOSIS — E1165 Type 2 diabetes mellitus with hyperglycemia: Secondary | ICD-10-CM

## 2022-10-09 MED ORDER — GABAPENTIN 300 MG PO CAPS: ORAL_CAPSULE | ORAL | 0 refills | Status: DC

## 2022-10-09 MED ORDER — GABAPENTIN 300 MG PO CAPS
300.0000 mg | ORAL_CAPSULE | Freq: Three times a day (TID) | ORAL | 0 refills | Status: DC
Start: 2022-10-09 — End: 2022-10-09

## 2022-10-09 NOTE — Telephone Encounter (Signed)
Requested Prescriptions Gabaprntin 300mg  Last office visit 05/12/2022 Last refill 07/03/2022

## 2022-10-09 NOTE — Telephone Encounter (Signed)
Controlled substance database reviewed, gabapentin No. 210 last filled on 09/15/2022.  7 tablets/day were discussed in April with option of additional 1 dose per day.  If she is taking 8/day, would not last a full 30 days with 210.  Will change prescription to 240 and refill ordered.

## 2022-10-09 NOTE — Telephone Encounter (Signed)
Pt has been notified.

## 2022-10-09 NOTE — Telephone Encounter (Signed)
Encourage patient to contact the pharmacy for refills or they can request refills through [facility]   WHAT PHARMACY WOULD THEY LIKE THIS SENT TO:  [facility]  [address] [place] [address]  MEDICATION NAME & DOSE: gabapentin (NEURONTIN) 300 MG capsule   NOTES/COMMENTS FROM PATIENT:      Front office please notify patient: It takes 48-72 hours to process rx refill requests Ask patient to call pharmacy to ensure rx is ready before heading there.

## 2022-10-23 DIAGNOSIS — G9009 Other idiopathic peripheral autonomic neuropathy: Secondary | ICD-10-CM | POA: Diagnosis not present

## 2022-10-23 DIAGNOSIS — M549 Dorsalgia, unspecified: Secondary | ICD-10-CM | POA: Diagnosis not present

## 2022-10-24 ENCOUNTER — Encounter: Payer: Self-pay | Admitting: Internal Medicine

## 2022-10-24 ENCOUNTER — Ambulatory Visit: Payer: BC Managed Care – PPO | Admitting: Internal Medicine

## 2022-10-24 VITALS — BP 124/76 | HR 66 | Ht 66.0 in | Wt 233.0 lb

## 2022-10-24 DIAGNOSIS — E039 Hypothyroidism, unspecified: Secondary | ICD-10-CM

## 2022-10-24 DIAGNOSIS — E1140 Type 2 diabetes mellitus with diabetic neuropathy, unspecified: Secondary | ICD-10-CM | POA: Diagnosis not present

## 2022-10-24 DIAGNOSIS — Z7984 Long term (current) use of oral hypoglycemic drugs: Secondary | ICD-10-CM | POA: Diagnosis not present

## 2022-10-24 DIAGNOSIS — M549 Dorsalgia, unspecified: Secondary | ICD-10-CM | POA: Diagnosis not present

## 2022-10-24 DIAGNOSIS — E1165 Type 2 diabetes mellitus with hyperglycemia: Secondary | ICD-10-CM

## 2022-10-24 DIAGNOSIS — G9009 Other idiopathic peripheral autonomic neuropathy: Secondary | ICD-10-CM | POA: Diagnosis not present

## 2022-10-24 DIAGNOSIS — Z7985 Long-term (current) use of injectable non-insulin antidiabetic drugs: Secondary | ICD-10-CM | POA: Diagnosis not present

## 2022-10-24 LAB — POCT GLUCOSE (DEVICE FOR HOME USE): Glucose Fasting, POC: 87 mg/dL (ref 70–99)

## 2022-10-24 LAB — POCT GLYCOSYLATED HEMOGLOBIN (HGB A1C): Hemoglobin A1C: 5.7 % — AB (ref 4.0–5.6)

## 2022-10-24 LAB — TSH: TSH: 16.13 u[IU]/mL — ABNORMAL HIGH (ref 0.35–5.50)

## 2022-10-24 MED ORDER — TIRZEPATIDE 5 MG/0.5ML ~~LOC~~ SOAJ: 5.0000 mg | SUBCUTANEOUS | 3 refills | Status: DC

## 2022-10-24 MED ORDER — METFORMIN HCL 850 MG PO TABS
850.0000 mg | ORAL_TABLET | Freq: Two times a day (BID) | ORAL | 3 refills | Status: DC
Start: 2022-10-24 — End: 2023-04-30

## 2022-10-24 MED ORDER — FARXIGA 10 MG PO TABS: 10.0000 mg | ORAL_TABLET | Freq: Every day | ORAL | 3 refills | Status: DC

## 2022-10-24 NOTE — Progress Notes (Unsigned)
Name: Teresa Castaneda  Age/ Sex: 52 y.o., female   MRN/ DOB: [id number], [birth year]     PCP: Shade Flood, MD   Reason for Endocrinology Evaluation: Type 2 Diabetes Mellitus  Initial Endocrine Consultative Visit: 04/05/2020    PATIENT IDENTIFIER: Teresa Castaneda is a 52 y.o. female with a past medical history of T2DM, cirrhosis  and Hypothyroidism. The patient has followed with Endocrinology clinic since 04/05/2020 for consultative assistance with management of her diabetes.  DIABETIC HISTORY:  Teresa Castaneda was diagnosed with DM in 08/2019, Metformin started 08/2019. Her hemoglobin A1c has ranged from 8.1% in 2022, peaking at 9.8% in 2022. Has mild upset stomach since increasing metformin but its transient    Glipizide started 02/2021    THYROID HISTORY: She has been diagnosed with hypothyroidism ~ 5 yrs ago No prior sx or radiation to the neck She was on Levothyroxine in he past but that didn't  work   Pt is on Armour thyroid 180 mg daily and 15 mg daily , we stopped 50 mg of Armour Thyroid which normalized her TSH.  Switched from armour to levothyroxine 04/2021 due to cost   Mounjaro started 10/2021  SUBJECTIVE:   During the last visit (04/24/2022): A1c 6.6 %      Today (10/25/2022): Teresa Castaneda  is here for a follow up on diabetes and hypothyroid management. She checks her blood sugars 1 x daily  . 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 She has been working 3rd shift    HOME ENDOCRINE  REGIMEN:  Metformin 850 mg BID  Farxiga 10 mg daily  Glipizide 5 mg, 1 tablet daily  Mounjaro 5 mg weekly Levothyroxine 150 mg daily       Statin: Yes ACE-I/ARB: No Prior Diabetic Education:  No   METER DOWNLOAD SUMMARY: n/a   DIABETIC COMPLICATIONS: Microvascular complications:  Neuropathy Denies: CKD, retinopathy Last Eye Exam: Completed 03/2020  Macrovascular complications:   Denies: CAD, CVA, PVD   HISTORY:  Past Medical History:  Past Medical History:  Diagnosis Date   Allergies    Anxiety    Arthritis    Asthma    Back pain    Spinal Issue    Diabetes mellitus without complication (HCC)    DJD (degenerative joint disease), lumbar    Elevated cholesterol    Gallstone    GERD (gastroesophageal reflux disease)    Liver cirrhosis secondary to NASH (HCC)    Neuromuscular disorder (HCC)    Obesity    Oxygen deficiency    Thyroid disease    Past Surgical History:  Past Surgical History:  Procedure Laterality Date   ABDOMINAL HYSTERECTOMY  02/21/2003   CHOLECYSTECTOMY N/A 05/10/2020   Procedure: LAPAROSCOPIC CHOLECYSTECTOMY WITH INTRAOPERATIVE CHOLANGIOGRAM AND LIVER BIOPSY;  Surgeon: Quentin Ore, MD;  Location: [facility];  Service: General;  Laterality: N/A;   DG SACROILIAC JOINTS ([facility] HX)  01/11/2021   [facility]   ENDOSCOPIC TURBINATE REDUCTION Bilateral 10/13/2020   Procedure: ENDOSCOPIC BILATERAL INFERIOR TURBINATE REDUCTIONS;  Surgeon: Drema Halon, MD;  Location: [facility];  Service: ENT;  Laterality: Bilateral;   ETHMOIDECTOMY Bilateral 10/13/2020   Procedure: BILATERAL TOTAL ETHMOIDECTOMY AND MAXILLARY  OSTIA ENLARGEMENTS;  Surgeon: Drema Halon, MD;  Location: [facility];  Service: ENT;  Laterality: Bilateral;   LIVER BIOPSY     NASAL SINUS SURGERY Bilateral 10/13/2020   Procedure: SINUS ENDOSCOPY WITH STEALTH NAVIGATION;  Surgeon: Drema Halon, MD;  Location: [facility];  Service: ENT;  Laterality: Bilateral;   TONSILLECTOMY     TOTAL ABDOMINAL HYSTERECTOMY     Social History:  reports that she has been smoking cigarettes. She started smoking about 37  years ago. She has a 17.4 pack-year smoking history. She has never used smokeless tobacco. She reports that she does not drink alcohol and does not use drugs. Family History:  Family History  Problem Relation Age of Onset   Coronary artery disease Mother    Diabetes Mother    COPD Mother    Coronary artery disease Father    Hypertension Father    Colon cancer Neg Hx    Esophageal cancer Neg Hx      HOME MEDICATIONS: Allergies as of 10/24/2022       Reactions   Clarithromycin Other (See Comments)   Body cramps    Codeine Nausea And Vomiting   Tylenol [acetaminophen] Other (See Comments)   Can not take Tylenol products due to liver   Penicillins Rash        Medication List        Accurate as of October 24, 2022 11:59 PM. If you have any questions, ask your nurse or doctor.          STOP taking these medications    glipiZIDE 5 MG tablet Commonly known as: GLUCOTROL Stopped by: Johnney Ou Gowri Suchan       TAKE these medications    albuterol 108 (90 Base) MCG/ACT inhaler Commonly known as: VENTOLIN HFA Inhale 2 puffs into the lungs every 6 (six) hours as needed for wheezing or shortness of breath.   amitriptyline 10 MG tablet Commonly known as: ELAVIL Take 1 tablet (10 mg total) by mouth at bedtime.   atorvastatin 10 MG tablet Commonly known as: LIPITOR Take 1 tablet (10 mg total) by mouth daily.   DULoxetine 60 MG capsule Commonly known as: CYMBALTA Take 1 capsule (60 mg total) by mouth daily. TAKE 2 CAPSULE BY MOUTH ONCE DAILY   Farxiga 10 MG Tabs tablet Generic drug: dapagliflozin propanediol Take 1 tablet (10 mg total) by mouth daily.   furosemide 20 MG tablet Commonly known as: Lasix Take 1 tablet (20 mg total) by mouth 2 (two) times daily.   gabapentin 300 MG capsule Commonly known as: NEURONTIN TAKE 1-2 CAPSULES BY MOUTH IN THE MORNING, 2 CAPSULES AT LUNCH AND 4 CAPSULES AT NIGHT   hydrOXYzine 25 MG tablet Commonly known as: ATARAX TAKE 1  TABLET BY MOUTH EVERY 6 HOURS UP TO THREE TIMES DAILY AS NEEDED   ibuprofen 600 MG tablet Commonly known as: ADVIL Take 1 tablet (600 mg total) by mouth 2 (two) times daily as needed.   levocetirizine 5 MG tablet Commonly known as: XYZAL Take 1 tablet (5 mg total) by mouth every evening.   levothyroxine 175 MCG tablet Commonly known as: SYNTHROID Take 1 tablet (175 mcg total) by mouth daily. What changed:  medication strength how much to take Changed by: Johnney Ou Cashe Gatt   lidocaine 5 % Commonly known as: LIDODERM USE 1 PATCH EXTERNALLY ONCE DAILY REMOVE  AND  DISCARD  PATCH  WITH  12  HOURS  OR  AS  DIRECTED  BY  MD   metFORMIN 850 MG tablet Commonly known as: GLUCOPHAGE Take 1 tablet (850 mg total) by mouth 2 (two) times daily with a meal.   omeprazole 40 MG capsule Commonly known as: PRILOSEC Take 1 capsule (40 mg total) by mouth daily.   rifaximin 550 MG Tabs tablet Commonly known as: Xifaxan Take 1 tablet (550 mg total) by mouth 2 (two) times daily. What changed: when to take this   tirzepatide 5 MG/0.5ML Pen Commonly known as: MOUNJARO Inject 5 mg into the skin once a week.   topiramate 25 MG tablet Commonly known as: Topamax Take 1 tablet (25 mg total) by mouth daily.   traMADol 50 MG tablet Commonly known as: ULTRAM Take 2 tablets (100 mg total) by mouth every 6 (six) hours as needed.   Trelegy Ellipta 100-62.5-25 MCG/ACT Aepb Generic drug: Fluticasone-Umeclidin-Vilant INHALE 1 PUFF INTO LUNGS DAILY.   triamcinolone 55 MCG/ACT Aero nasal inhaler Commonly known as: NASACORT Place 2 sprays into the nose daily.   Vitamin D (Ergocalciferol) 1.25 MG (50000 UNIT) Caps capsule Commonly known as: DRISDOL Take 50,000 Units by mouth once a week.         OBJECTIVE:   Vital Signs: BP 124/76 (BP Location: Left Arm, Patient Position: Sitting, Cuff Size: Large)   Pulse 66   Ht 5\' 6"  (1.676 m)   Wt 233 lb (105.7 kg)   SpO2 97%   BMI 37.61 kg/m   Wt  Readings from Last 3 Encounters:  10/24/22 233 lb (105.7 kg)  08/18/22 249 lb (112.9 kg)  06/14/22 249 lb 8 oz (113.2 kg)     Exam: General: Pt appears well and is in NAD  Neck: General: Supple without adenopathy. Thyroid:  No goiter or nodules appreciated.   Lungs: Clear with good BS bilat with no rales, rhonchi, or wheezes  Heart: RRR   Extremities: Trace edema   Neuro: MS is good with appropriate affect, pt is alert and Ox3   DM Foot Exam 04/24/2022 The skin of the feet is intact without sores or ulcerations. The pedal pulses are 2+ on right and 2+ on left. The sensation is decreased  to a screening 5.07, 10 gram monofilament bilaterally   DATA REVIEWED:   Lab Results  Component Value Date   HGBA1C 5.7 (A) 10/24/2022   HGBA1C 6.6 (A) 04/24/2022   HGBA1C 7.4 (A) 10/21/2021    Latest Reference Range & Units 10/24/22 08:20  TSH 0.35 - 5.50 uIU/mL 16.13 (H)  (H): Data is abnormally high  Latest Reference Range & Units 08/30/21 10:06  Sodium 135 - 145 mEq/L 138  Potassium 3.5 - 5.1 mEq/L 3.9  Chloride 96 - 112 mEq/L 103  CO2 19 - 32 mEq/L 27  Glucose 70 - 99 mg/dL 782 (H)  BUN 6 - 23 mg/dL 6  Creatinine 9.56 - 2.13 mg/dL 0.86  Calcium 8.4 - 57.8 mg/dL 9.0  Alkaline Phosphatase 39 - 117 U/L 81  Albumin 3.5 - 5.2 g/dL 3.5  AST 0 - 37 U/L 39 (H)  ALT 0 - 35 U/L 29  Total Protein 6.0 - 8.3 g/dL 7.3  Total Bilirubin 0.2 - 1.2 mg/dL 0.7  GFR >46.96 mL/min 110.41    Latest Reference Range & Units 10/21/21 08:43  TSH 0.35 - 5.50 uIU/mL 1.80  T4,Free(Direct) 0.60 - 1.60 ng/dL 2.95    In office BG 284 mg/dL   ASSESSMENT / PLAN / RECOMMENDATIONS:   1) Type 2 Diabetes Mellitus, Optimally  With Neuropathic complications - Most recent A1c of 5.7%. Goal A1c < 7.0 %.     - Praised the pt on improved glycemic control  -Intolerant to higher doses of Metformin  -We have opted to discontinue glipizide at this time -Would like to remain on current dose of  Mounjaro  MEDICATIONS: Stop glipizide Continue Metformin 850 mg, 1 tablet with Breakfast and Supper  Continue Farxiga 10  mg daily  Continue  Mounjaro  mg weekly   EDUCATION / INSTRUCTIONS: BG monitoring instructions: Patient is instructed to check her blood sugars 1 times a day, fasting. Call [facility] if: BG persistently < 70  I reviewed the Rule of 15 for the treatment of hypoglycemia in detail with the patient. Literature supplied.    2) Diabetic complications:  Eye: Does not have known diabetic retinopathy.  Neuro/ Feet: Does not have known diabetic peripheral neuropathy .  Renal: Patient does not have known baseline CKD. She   is not on an ACEI/ARB at present.    3) Hypothyroidism:   - Pt is clinically euthyroid  -Tsh is elevated, will increase levothyroxine as below  Medication  Stop levothyroxine 150 mg daily Start levothyroxine 175 mcg daily   F/U in 6 months   Signed electronically by: Lyndle Herrlich, MD  [facility]  [facility] [address] [place] [place], [place] [address] Phone: [phone] FAX: [phone]   CC: Shade Flood, MD [address] [place] [address] Phone: [phone]  Fax: [phone]  Return to Endocrinology clinic as below: Future Appointments  Date Time Provider Department Center  11/20/2022  9:20 AM Carlis Abbott, Drema Pry, MD [facility]  04/30/2023  7:30 AM Creston Klas, Konrad Dolores, MD [id number] None

## 2022-10-24 NOTE — Patient Instructions (Signed)
-   Continue Metformin 850 mg, 1 tablet with Breakfast and Supper  - Continue Farxiga 10 mg daily  - STOP Glipizide 5 mg,  1 tablet before breakfast  - Continue  Mounjaro 5 mg weekly      HOW TO TREAT LOW BLOOD SUGARS (Blood sugar LESS THAN 70 MG/DL) Please follow the RULE OF 15 for the treatment of hypoglycemia treatment (when your (blood sugars are less than 70 mg/dL)   STEP 1: Take 15 grams of carbohydrates when your blood sugar is low, which includes:  3-4 GLUCOSE TABS  OR 3-4 OZ OF JUICE OR REGULAR SODA OR ONE TUBE OF GLUCOSE GEL    STEP 2: RECHECK blood sugar in 15 MINUTES STEP 3: If your blood sugar is still low at the 15 minute recheck --> then, go back to STEP 1 and treat AGAIN with another 15 grams of carbohydrates.

## 2022-10-25 MED ORDER — LEVOTHYROXINE SODIUM 175 MCG PO TABS: 175.0000 ug | ORAL_TABLET | Freq: Every day | ORAL | 3 refills | Status: DC

## 2022-10-29 ENCOUNTER — Encounter (HOSPITAL_COMMUNITY): Payer: Self-pay | Admitting: Emergency Medicine

## 2022-10-29 ENCOUNTER — Ambulatory Visit (HOSPITAL_COMMUNITY)
Admission: EM | Admit: 2022-10-29 | Discharge: 2022-10-29 | Disposition: A | Discharge status: Home / Self Care | Payer: BC Managed Care – PPO | Attending: Internal Medicine | Admitting: Internal Medicine

## 2022-10-29 ENCOUNTER — Other Ambulatory Visit: Payer: Self-pay

## 2022-10-29 ENCOUNTER — Ambulatory Visit (INDEPENDENT_AMBULATORY_CARE_PROVIDER_SITE_OTHER): Payer: BC Managed Care – PPO

## 2022-10-29 DIAGNOSIS — M25552 Pain in left hip: Secondary | ICD-10-CM | POA: Diagnosis not present

## 2022-10-29 MED ORDER — DEXAMETHASONE SODIUM PHOSPHATE 10 MG/ML IJ SOLN
10.0000 mg | Freq: Once | INTRAMUSCULAR | Status: AC
  Administered 2022-10-29: 10 mg via INTRAMUSCULAR

## 2022-10-29 MED ORDER — PREDNISONE 20 MG PO TABS: 40.0000 mg | ORAL_TABLET | Freq: Every day | ORAL | 0 refills | Status: AC

## 2022-10-29 MED ORDER — DEXAMETHASONE SODIUM PHOSPHATE 10 MG/ML IJ SOLN
INTRAMUSCULAR | Status: AC
  Filled 2022-10-29: qty 1

## 2022-10-29 MED ORDER — BACLOFEN 10 MG PO TABS: 10.0000 mg | ORAL_TABLET | Freq: Three times a day (TID) | ORAL | 0 refills | Status: DC

## 2022-10-29 NOTE — ED Provider Notes (Signed)
[facility]    CSN: [id number] Arrival date & time: 10/29/22  1218      History   Chief Complaint Chief Complaint  Patient presents with   Hip Pain    HPI Teresa Castaneda is a 52 y.o. female.   Patient presents to urgent care for evaluation of left-sided hip pain that started 5 days ago. Left hip pain is to the left buttock/sacroiliac joint and does not radiate to the legs. No new paresthesias, extremity weakness, dizziness.  Patient fell 2 weeks ago down a couple of carpeted steps on her right side being and right knee pain.  She did not hit her head and has been ambulatory since injury.  Left hip pain started a week and a half after falling.  She then fell again 2 days after the left hip pain started (3 days ago on Friday, October 27, 2022) when she was attempting to get off of the toilet.  Since most recent fall 3 days ago, she has had constant pain to the left hip/buttock.  Pain is worsened with weightbearing activity and movement.  No saddle paresthesia, changes to bowel or urinary habits, extremity weakness, radicular symptoms.  Denies preceding dizziness, chest pain, shortness of breath, or heart palpitations prior to falling. Pain has not responded well to ibuprofen over-the-counter at home.  No history of sciatic nerve pain.  Takes tramadol for chronic diabetic neuropathy pain.  Most recent hemoglobin A1c 5.7.    Hip Pain    Past Medical History:  Diagnosis Date   Allergies    Anxiety    Arthritis    Asthma    Back pain    Spinal Issue    Diabetes mellitus without complication (HCC)    DJD (degenerative joint disease), lumbar    Elevated cholesterol    Gallstone    GERD (gastroesophageal reflux disease)    Liver cirrhosis secondary to NASH (HCC)    Neuromuscular disorder (HCC)    Obesity    Oxygen deficiency    Thyroid disease     Patient Active Problem List   Diagnosis Date Noted   Recurrent sinusitis 05/12/2022   Type 2 diabetes mellitus with  diabetic neuropathy, without long-term current use of insulin (HCC) 05/12/2022   Cirrhosis of liver (HCC) 06/23/2020   Symptomatic cholelithiasis 05/10/2020   Acquired hypothyroidism 04/05/2020   Weight gain 04/05/2020   Type 2 diabetes mellitus with hyperglycemia, without long-term current use of insulin (HCC) 04/05/2020   History of COVID-19 09/23/2019   Pneumonia due to COVID-19 virus 09/23/2019   Cough 09/23/2019   BMI 40.0-44.9, adult (HCC) 07/22/2019   Facet arthropathy, lumbar 07/22/2019   Sacroiliitis (HCC) 07/22/2019   Asthma 09/30/2016   ANXIETY 09/10/2006   DEPRESSION 09/10/2006   ASTHMA 09/10/2006   HEADACHE 09/10/2006    Past Surgical History:  Procedure Laterality Date   ABDOMINAL HYSTERECTOMY  02/21/2003   CHOLECYSTECTOMY N/A 05/10/2020   Procedure: LAPAROSCOPIC CHOLECYSTECTOMY WITH INTRAOPERATIVE CHOLANGIOGRAM AND LIVER BIOPSY;  Surgeon: Quentin Ore, MD;  Location: [facility];  Service: General;  Laterality: N/A;   DG SACROILIAC JOINTS ([facility] HX)  01/11/2021   [facility]   ENDOSCOPIC TURBINATE REDUCTION Bilateral 10/13/2020   Procedure: ENDOSCOPIC BILATERAL INFERIOR TURBINATE REDUCTIONS;  Surgeon: Drema Halon, MD;  Location: [facility];  Service: ENT;  Laterality: Bilateral;   ETHMOIDECTOMY Bilateral 10/13/2020   Procedure: BILATERAL TOTAL ETHMOIDECTOMY AND MAXILLARY OSTIA ENLARGEMENTS;  Surgeon: Drema Halon, MD;  Location: [facility];  Service: ENT;  Laterality: Bilateral;   LIVER BIOPSY     NASAL SINUS SURGERY Bilateral 10/13/2020   Procedure: SINUS ENDOSCOPY WITH STEALTH NAVIGATION;  Surgeon: Drema Halon, MD;  Location: [facility];  Service: ENT;  Laterality: Bilateral;   TONSILLECTOMY     TOTAL ABDOMINAL HYSTERECTOMY      OB History   No obstetric history on file.      Home Medications    Prior to Admission medications   Medication Sig Start Date End Date Taking?  Authorizing Provider  baclofen (LIORESAL) 10 MG tablet Take 1 tablet (10 mg total) by mouth 3 (three) times daily. 10/29/22  Yes Carlisle Beers, FNP  predniSONE (DELTASONE) 20 MG tablet Take 2 tablets (40 mg total) by mouth daily for 5 days. 10/29/22 11/03/22 Yes Carlisle Beers, FNP  albuterol (VENTOLIN HFA) 108 (90 Base) MCG/ACT inhaler Inhale 2 puffs into the lungs every 6 (six) hours as needed for wheezing or shortness of breath. 08/08/21   Martina Sinner, MD  amitriptyline (ELAVIL) 10 MG tablet Take 1 tablet (10 mg total) by mouth at bedtime. 02/16/22   Raulkar, Drema Pry, MD  atorvastatin (LIPITOR) 10 MG tablet Take 1 tablet (10 mg total) by mouth daily. 10/18/20   Shade Flood, MD  DULoxetine (CYMBALTA) 60 MG capsule Take 1 capsule (60 mg total) by mouth daily. TAKE 2 CAPSULE BY MOUTH ONCE DAILY 04/19/22   Shade Flood, MD  FARXIGA 10 MG TABS tablet Take 1 tablet (10 mg total) by mouth daily. 10/24/22   Shamleffer, Konrad Dolores, MD  furosemide (LASIX) 20 MG tablet Take 1 tablet (20 mg total) by mouth 2 (two) times daily. 06/14/22   Lynann Bologna, MD  gabapentin (NEURONTIN) 300 MG capsule TAKE 1-2 CAPSULES BY MOUTH IN THE MORNING, 2 CAPSULES AT LUNCH AND 4 CAPSULES AT NIGHT 10/09/22   Shade Flood, MD  hydrOXYzine (ATARAX) 25 MG tablet TAKE 1 TABLET BY MOUTH EVERY 6 HOURS UP TO THREE TIMES DAILY AS NEEDED 08/23/22   Raulkar, Drema Pry, MD  ibuprofen (ADVIL) 600 MG tablet Take 1 tablet (600 mg total) by mouth 2 (two) times daily as needed. 04/15/21   Ralene Cork, DO  levocetirizine (XYZAL) 5 MG tablet Take 1 tablet (5 mg total) by mouth every evening. 05/02/21   Shade Flood, MD  levothyroxine (SYNTHROID) 175 MCG tablet Take 1 tablet (175 mcg total) by mouth daily. 10/25/22   Shamleffer, Konrad Dolores, MD  lidocaine (LIDODERM) 5 % USE 1 PATCH EXTERNALLY ONCE DAILY REMOVE  AND  DISCARD  PATCH  WITH  12  HOURS  OR  AS  DIRECTED  BY  MD 08/11/20   Shade Flood,  MD  metFORMIN (GLUCOPHAGE) 850 MG tablet Take 1 tablet (850 mg total) by mouth 2 (two) times daily with a meal. 10/24/22   Shamleffer, Konrad Dolores, MD  omeprazole (PRILOSEC) 40 MG capsule Take 1 capsule (40 mg total) by mouth daily. 11/04/21   Shade Flood, MD  rifaximin (XIFAXAN) 550 MG TABS tablet Take 1 tablet (550 mg total) by mouth 2 (two) times daily. Patient taking differently: Take 550 mg by mouth every other day. 11/25/21   Lynann Bologna, MD  tirzepatide [facility]) 5 MG/0.5ML Pen Inject 5 mg into the skin once a week. 10/24/22   Shamleffer, Konrad Dolores, MD  topiramate (TOPAMAX) 25 MG tablet Take 1 tablet (25 mg total) by mouth daily. 08/18/22   Sula Soda  P, MD  traMADol (ULTRAM) 50 MG tablet Take 2 tablets (100 mg total) by mouth every 6 (six) hours as needed. 09/27/22   Raulkar, Drema Pry, MD  TRELEGY ELLIPTA 100-62.5-25 MCG/ACT AEPB INHALE 1 PUFF INTO LUNGS DAILY. 10/02/22   Martina Sinner, MD  triamcinolone (NASACORT) 55 MCG/ACT AERO nasal inhaler Place 2 sprays into the nose daily. 11/04/21   Shade Flood, MD  Vitamin D, Ergocalciferol, (DRISDOL) 1.25 MG (50000 UNIT) CAPS capsule Take 50,000 Units by mouth once a week. 10/01/22   [provider]  azelastine (ASTELIN) 0.1 % nasal spray Place 2 sprays into both nostrils 2 (two) times daily. 04/23/19 12/26/19  Belinda Fisher, PA-C  furosemide (LASIX) 20 MG tablet Take 1 tablet by mouth once daily Patient taking differently: Take 20 mg by mouth 2 (two) times daily. 04/28/22   Shade Flood, MD    Family History Family History  Problem Relation Age of Onset   Coronary artery disease Mother    Diabetes Mother    COPD Mother    Coronary artery disease Father    Hypertension Father    Colon cancer Neg Hx    Esophageal cancer Neg Hx     Social History Social History   Tobacco Use   Smoking status: Some Days    Current packs/day: 0.00    Average packs/day: 0.5 packs/day for 34.7 years (17.4 ttl pk-yrs)     Types: Cigarettes    Start date: 01/16/1985    Last attempt to quit: 10/08/2019    Years since quitting: 3.0   Smokeless tobacco: Never   Tobacco comments:    7- 8 cig a week  Vaping Use   Vaping status: Never Used  Substance Use Topics   Alcohol use: No   Drug use: No     Allergies   Clarithromycin, Codeine, Tylenol [acetaminophen], and Penicillins   Review of Systems Review of Systems Per HPI  Physical Exam Triage Vital Signs ED Triage Vitals  Encounter Vitals Group     BP 10/29/22 1249 125/80     Systolic BP Percentile --      Diastolic BP Percentile --      Pulse Rate 10/29/22 1249 90     Resp 10/29/22 1249 16     Temp 10/29/22 1249 97.9 F (36.6 C)     Temp Source 10/29/22 1249 Oral     SpO2 10/29/22 1249 92 %     Weight --      Height --      Head Circumference --      Peak Flow --      Pain Score 10/29/22 1248 7     Pain Loc --      Pain Education --      Exclude from Growth Chart --    No data found.  Updated Vital Signs BP 125/80 (BP Location: Left Arm)   Pulse 90   Temp 97.9 F (36.6 C) (Oral)   Resp 16   SpO2 92%   Visual Acuity Right Eye Distance:   Left Eye Distance:   Bilateral Distance:    Right Eye Near:   Left Eye Near:    Bilateral Near:     Physical Exam Vitals and nursing note reviewed.  Constitutional:      Appearance: She is not ill-appearing or toxic-appearing.  HENT:     Head: Normocephalic and atraumatic.     Right Ear: Hearing and external ear normal.  Left Ear: Hearing and external ear normal.     Nose: Nose normal.     Mouth/Throat:     Lips: Pink.  Eyes:     General: Lids are normal. Vision grossly intact. Gaze aligned appropriately.     Extraocular Movements: Extraocular movements intact.     Conjunctiva/sclera: Conjunctivae normal.  Pulmonary:     Effort: Pulmonary effort is normal.  Musculoskeletal:     Cervical back: Normal and neck supple.     Thoracic back: Normal.     Lumbar back: Tenderness  present. No swelling, edema, deformity, signs of trauma, lacerations, spasms or bony tenderness. Normal range of motion. Negative right straight leg raise test and negative left straight leg raise test. No scoliosis.       Back:     Right hip: Normal.     Left hip: Normal. No tenderness, bony tenderness or crepitus. Normal range of motion. Normal strength.     Comments: Strength and sensation intact to bilateral upper and lower extremities (5/5). Moves all 4 extremities with normal coordination voluntarily. Non-focal neuro exam.   Skin:    General: Skin is warm and dry.     Capillary Refill: Capillary refill takes less than 2 seconds.     Findings: No rash.  Neurological:     General: No focal deficit present.     Mental Status: She is alert and oriented to person, place, and time. Mental status is at baseline.     Cranial Nerves: No dysarthria or facial asymmetry.  Psychiatric:        Mood and Affect: Mood normal.        Speech: Speech normal.        Behavior: Behavior normal.        Thought Content: Thought content normal.        Judgment: Judgment normal.      UC Treatments / Results  Labs (all labs ordered are listed, but only abnormal results are displayed) Labs Reviewed - No data to display  EKG   Radiology No results found.  Procedures Procedures (including critical care time)  Medications Ordered in UC Medications  dexamethasone (DECADRON) injection 10 mg (10 mg Intramuscular Given 10/29/22 1343)    Initial Impression / Assessment and Plan / UC Course  I have reviewed the triage vital signs and the nursing notes.  Pertinent labs & imaging results that were available during my care of the patient were reviewed by me and considered in my medical decision making (see chart for details).   1.  Left hip pain, fall Left hip x-rays unremarkable for acute bony abnormality by my interpretation.  Will call patient if radiology reread indicates change in treatment  plan. I would like to treat this as inflammation to the left sciatic nerve likely triggered by recent falls.  Symptoms have not responded well to NSAIDs over-the-counter at home, therefore will initiate steroid therapy today. Dexamethasone 10 mg IM.  Prednisone burst to be started tomorrow.  Discussed prednisone may increase sugars temporarily.  Diabetes is currently well-controlled. Muscle relaxer as needed, drowsiness precautions discussed.  Heat and gentle range of motion exercises as needed.  Counseled patient on potential for adverse effects with medications prescribed/recommended today, strict ER and return-to-clinic precautions discussed, patient verbalized understanding.    Final Clinical Impressions(s) / UC Diagnoses   Final diagnoses:  Left hip pain     Discharge Instructions      Your low back pain is due to sciatic nerve pain.  I gave you a steroid shot in the clinic to help reduce inflammation and pain. Start taking prednisone once daily for the next 5 days starting tomorrow.  Take this with food to avoid stomach upset. Take muscle relaxer at bedtime as needed for muscle spasm.  No that the muscle relaxer may make you sleepy, so do not take this during the daytime or when drinking/driving.  Apply heat to the low back and use gentle range of motion exercises to prevent stiffness to the area.  Please schedule an appointment for follow-up with your primary care provider or the orthopedic provider listed on your paperwork.     ED Prescriptions     Medication Sig Dispense Auth. Provider   predniSONE (DELTASONE) 20 MG tablet Take 2 tablets (40 mg total) by mouth daily for 5 days. 10 tablet Carlisle Beers, FNP   baclofen (LIORESAL) 10 MG tablet Take 1 tablet (10 mg total) by mouth 3 (three) times daily. 30 each Carlisle Beers, FNP      PDMP not reviewed this encounter.   Carlisle Beers, [place] 10/29/22 1357

## 2022-10-29 NOTE — ED Triage Notes (Signed)
Patient fell 2 weeks ago with no known injury. Larey Seat again this past Friday, and has had constant left hip pain. She rates the pain at a 7/10 when walking. States that she is already on tramadol from her pain management doctor. She denies using anything OTC for pain relief.

## 2022-10-29 NOTE — Discharge Instructions (Addendum)
Your low back pain is due to sciatic nerve pain. I gave you a steroid shot in the clinic to help reduce inflammation and pain. Start taking prednisone once daily for the next 5 days starting tomorrow.  Take this with food to avoid stomach upset. Take muscle relaxer at bedtime as needed for muscle spasm.  No that the muscle relaxer may make you sleepy, so do not take this during the daytime or when drinking/driving.  Apply heat to the low back and use gentle range of motion exercises to prevent stiffness to the area.  Please schedule an appointment for follow-up with your primary care provider or the orthopedic provider listed on your paperwork.

## 2022-10-30 ENCOUNTER — Other Ambulatory Visit: Payer: Self-pay | Admitting: Physical Medicine and Rehabilitation

## 2022-10-30 ENCOUNTER — Other Ambulatory Visit: Payer: Self-pay | Admitting: Pulmonary Disease

## 2022-10-30 ENCOUNTER — Encounter: Payer: Self-pay | Admitting: Physical Medicine and Rehabilitation

## 2022-10-30 DIAGNOSIS — J4540 Moderate persistent asthma, uncomplicated: Secondary | ICD-10-CM

## 2022-10-30 MED ORDER — TRAMADOL HCL 50 MG PO TABS: 100.0000 mg | ORAL_TABLET | Freq: Four times a day (QID) | ORAL | 0 refills | Status: DC | PRN

## 2022-10-30 MED ORDER — AMITRIPTYLINE HCL 10 MG PO TABS: 10.0000 mg | ORAL_TABLET | Freq: Every day | ORAL | 1 refills | Status: DC

## 2022-11-02 ENCOUNTER — Other Ambulatory Visit: Payer: Self-pay | Admitting: Family Medicine

## 2022-11-02 DIAGNOSIS — E1165 Type 2 diabetes mellitus with hyperglycemia: Secondary | ICD-10-CM

## 2022-11-20 ENCOUNTER — Encounter
Payer: BC Managed Care – PPO | Attending: Physical Medicine and Rehabilitation | Admitting: Physical Medicine and Rehabilitation

## 2022-11-20 ENCOUNTER — Encounter: Payer: Self-pay | Admitting: Physical Medicine and Rehabilitation

## 2022-11-20 VITALS — BP 116/79 | HR 76 | Ht 66.0 in | Wt 229.0 lb

## 2022-11-20 DIAGNOSIS — E1140 Type 2 diabetes mellitus with diabetic neuropathy, unspecified: Secondary | ICD-10-CM | POA: Insufficient documentation

## 2022-11-20 DIAGNOSIS — Z794 Long term (current) use of insulin: Secondary | ICD-10-CM

## 2022-11-20 MED ORDER — HYDROCODONE-ACETAMINOPHEN 5-325 MG PO TABS: 1.0000 | ORAL_TABLET | Freq: Three times a day (TID) | ORAL | 0 refills | Status: DC | PRN

## 2022-11-20 NOTE — Patient Instructions (Signed)
foods that may reduce pain: 1) Ginger (especially studied for arthritis)- reduce leukotriene production to decrease inflammation 2) Blueberries- high in phytonutrients that decrease inflammation 3) Salmon- marine omega-3s reduce joint swelling and pain 4) Pumpkin seeds- reduce inflammation 5) dark chocolate- reduces inflammation 6) turmeric- reduces inflammation 7) tart cherries - reduce pain and stiffness 8) extra virgin olive oil - its compound olecanthal helps to block prostaglandins  9) chili peppers- can be eaten or applied topically via capsaicin 10) mint- helpful for headache, muscle aches, joint pain, and itching 11) garlic- reduces inflammation  Link to further information on diet for chronic pain: http://[email or web address]

## 2022-11-20 NOTE — Progress Notes (Signed)
-  Discussed Qutenza as an option for neuropathic pain control. Discussed that this is a capsaicin patch, stronger than capsaicin cream. Discussed that it is currently approved for diabetic peripheral neuropathy and post-herpetic neuralgia, but that it has also shown benefit in treating other forms of neuropathy. Provided patient with link to site to learn more about the patch: https://www.clark.biz/. Discussed that the patch would be placed in office and benefits usually last 3 months. Discussed that unintended exposure to capsaicin can cause severe irritation of eyes, mucous membranes, respiratory tract, and skin, but that Qutenza is a local treatment and does not have the systemic side effects of other nerve medications. Discussed that there may be pain, itching, erythema, and decreased sensory function associated with the application of Qutenza. Side effects usually subside within 1 week. A cold pack of analgesic medications can help with these side effects. Blood pressure can also be increased due to pain associated with administration of the patch.   4 patches of Qutenza [id number]) was applied to the area of pain. Ice packs were applied during the procedure to ensure patient comfort. Blood pressure was monitored every 15 minutes. The patient tolerated the procedure well. Post-procedure instructions were given and follow-up has been scheduled.  Topical system measures 14cm x20cm (280cm for a total 1120units) were applied which will cause deeper penetration for destruction of the peripheral nerve using a chemical (Qutenza) which infuses into the skin like an injection and heat technique (occlusive, compressive dressing cauing endothermic heat technique)

## 2022-11-23 DIAGNOSIS — G9009 Other idiopathic peripheral autonomic neuropathy: Secondary | ICD-10-CM | POA: Diagnosis not present

## 2022-11-23 DIAGNOSIS — M549 Dorsalgia, unspecified: Secondary | ICD-10-CM | POA: Diagnosis not present

## 2022-11-24 DIAGNOSIS — M549 Dorsalgia, unspecified: Secondary | ICD-10-CM | POA: Diagnosis not present

## 2022-11-24 DIAGNOSIS — G9009 Other idiopathic peripheral autonomic neuropathy: Secondary | ICD-10-CM | POA: Diagnosis not present

## 2022-11-30 ENCOUNTER — Other Ambulatory Visit: Payer: Self-pay | Admitting: Family Medicine

## 2022-11-30 DIAGNOSIS — F411 Generalized anxiety disorder: Secondary | ICD-10-CM

## 2022-12-07 ENCOUNTER — Telehealth: Payer: Self-pay

## 2022-12-07 ENCOUNTER — Other Ambulatory Visit: Payer: Self-pay | Admitting: Physical Medicine and Rehabilitation

## 2022-12-07 ENCOUNTER — Encounter: Payer: Self-pay | Admitting: Physical Medicine and Rehabilitation

## 2022-12-07 MED ORDER — TRAMADOL HCL 50 MG PO TABS
100.0000 mg | ORAL_TABLET | Freq: Four times a day (QID) | ORAL | 0 refills | Status: DC | PRN
Start: 2022-12-07 — End: 2022-12-20

## 2022-12-07 NOTE — Telephone Encounter (Signed)
PA for Tramadol submitted 

## 2022-12-08 NOTE — Telephone Encounter (Signed)
Outcome Approved today by [facility] NCPDP 2017 Your PA request has been approved. Additional information will be provided in the approval communication. (Message 1145) Authorization Expiration Date: 06/06/2023

## 2022-12-19 NOTE — Progress Notes (Unsigned)
Subjective:    Patient ID: Teresa Castaneda, female    DOB: Oct 02, 1970, 52 y.o.   MRN: [id number]  HPI: Teresa Castaneda is a 52 y.o. female who returns for follow up appointment for chronic pain and medication refill. She states her pain is located in her lower back and bilateral feet with tingling and burning. She also reports some tingling  in her bilateral hands.  She rates her pain 5. Her current exercise regime is walking and performing stretching exercises.  Teresa Castaneda equivalent is 80.00 MME.   UDS ordered today    Pain Inventory Average Pain 4 Pain Right Now 5 My pain is sharp, burning, stabbing, tingling, and aching  In the last 24 hours, has pain interfered with the following? General activity 1 Relation with others 1 Enjoyment of life 1 What TIME of day is your pain at its worst? morning , daytime, evening, and night Sleep (in general) Poor  Pain is worse with: some activites Pain improves with: heat/ice and medication Relief from Meds: 6  Family History  Problem Relation Age of Onset   Coronary artery disease Mother    Diabetes Mother    COPD Mother    Coronary artery disease Father    Hypertension Father    Colon cancer Neg Hx    Esophageal cancer Neg Hx    Social History   Socioeconomic History   Marital status: Married    Spouse name: Not on file   Number of children: Not on file   Years of education: Not on file   Highest education level: 9th grade  Occupational History   Occupation: Development worker, international aid  Tobacco Use   Smoking status: Some Days    Current packs/day: 0.00    Average packs/day: 0.5 packs/day for 34.7 years (17.4 ttl pk-yrs)    Types: Cigarettes    Start date: 01/16/1985    Last attempt to quit: 10/08/2019    Years since quitting: 3.2   Smokeless tobacco: Never   Tobacco comments:    7- 8 cig a week  Vaping Use   Vaping status: Never Used  Substance and Sexual Activity   Alcohol use: No   Drug use: No   Sexual activity: Yes     Birth control/protection: Surgical  Other Topics Concern   Not on file  Social History Narrative   Right Handed    Caffeine rare   Live with husband and mother in law in a two story hpme   Social Determinants of Health   Financial Resource Strain: Not on file  Food Insecurity: Food Insecurity Present (05/12/2022)   Hunger Vital Sign    Worried About Running Out of Food in the Last Year: Never true    Ran Out of Food in the Last Year: Sometimes true  Transportation Needs: No Transportation Needs (05/12/2022)   PRAPARE - Administrator, Civil Service (Medical): No    Lack of Transportation (Non-Medical): No  Physical Activity: Unknown (05/12/2022)   Exercise Vital Sign    Days of Exercise per Week: 1 day    Minutes of Exercise per Session: Patient declined  Stress: Stress Concern Present (05/12/2022)   [employer] of Occupational Health - Occupational Stress Questionnaire    Feeling of Stress : Very much  Social Connections: Moderately Integrated (05/12/2022)   Social Connection and Isolation Panel [NHANES]    Frequency of Communication with Friends and Family: Twice a week    Frequency of Social Gatherings  with Friends and Family: Twice a week    Attends Religious Services: More than 4 times per year    Active Member of Clubs or Organizations: No    Attends Engineer, structural: Not on file    Marital Status: Married   Past Surgical History:  Procedure Laterality Date   ABDOMINAL HYSTERECTOMY  02/21/2003   CHOLECYSTECTOMY N/A 05/10/2020   Procedure: LAPAROSCOPIC CHOLECYSTECTOMY WITH INTRAOPERATIVE CHOLANGIOGRAM AND LIVER BIOPSY;  Surgeon: Quentin Ore, MD;  Location: [facility];  Service: General;  Laterality: N/A;   DG SACROILIAC JOINTS ([facility] HX)  01/11/2021   [facility]   ENDOSCOPIC TURBINATE REDUCTION Bilateral 10/13/2020   Procedure: ENDOSCOPIC BILATERAL INFERIOR TURBINATE REDUCTIONS;  Surgeon: Drema Halon, MD;  Location:  [facility];  Service: ENT;  Laterality: Bilateral;   ETHMOIDECTOMY Bilateral 10/13/2020   Procedure: BILATERAL TOTAL ETHMOIDECTOMY AND MAXILLARY OSTIA ENLARGEMENTS;  Surgeon: Drema Halon, MD;  Location: [facility];  Service: ENT;  Laterality: Bilateral;   LIVER BIOPSY     NASAL SINUS SURGERY Bilateral 10/13/2020   Procedure: SINUS ENDOSCOPY WITH STEALTH NAVIGATION;  Surgeon: Drema Halon, MD;  Location: [facility];  Service: ENT;  Laterality: Bilateral;   TONSILLECTOMY     TOTAL ABDOMINAL HYSTERECTOMY     Past Surgical History:  Procedure Laterality Date   ABDOMINAL HYSTERECTOMY  02/21/2003   CHOLECYSTECTOMY N/A 05/10/2020   Procedure: LAPAROSCOPIC CHOLECYSTECTOMY WITH INTRAOPERATIVE CHOLANGIOGRAM AND LIVER BIOPSY;  Surgeon: Quentin Ore, MD;  Location: [facility];  Service: General;  Laterality: N/A;   DG SACROILIAC JOINTS ([facility] HX)  01/11/2021   [facility]   ENDOSCOPIC TURBINATE REDUCTION Bilateral 10/13/2020   Procedure: ENDOSCOPIC BILATERAL INFERIOR TURBINATE REDUCTIONS;  Surgeon: Drema Halon, MD;  Location: [facility];  Service: ENT;  Laterality: Bilateral;   ETHMOIDECTOMY Bilateral 10/13/2020   Procedure: BILATERAL TOTAL ETHMOIDECTOMY AND MAXILLARY OSTIA ENLARGEMENTS;  Surgeon: Drema Halon, MD;  Location:  SURGERY CENTER;  Service: ENT;  Laterality: Bilateral;   LIVER BIOPSY     NASAL SINUS SURGERY Bilateral 10/13/2020   Procedure: SINUS ENDOSCOPY WITH STEALTH NAVIGATION;  Surgeon: Drema Halon, MD;  Location:  SURGERY CENTER;  Service: ENT;  Laterality: Bilateral;   TONSILLECTOMY     TOTAL ABDOMINAL HYSTERECTOMY     Past Medical History:  Diagnosis Date   Allergies    Anxiety    Arthritis    Asthma    Back pain    Spinal Issue    Diabetes mellitus without complication (HCC)    DJD (degenerative joint disease), lumbar    Elevated  cholesterol    Gallstone    GERD (gastroesophageal reflux disease)    Liver cirrhosis secondary to NASH (HCC)    Neuromuscular disorder (HCC)    Obesity    Oxygen deficiency    Thyroid disease    There were no vitals taken for this visit.  Opioid Risk Score:   Fall Risk Score:  `1  Depression screen [facility] 2/9     11/20/2022    9:06 AM 08/18/2022   10:59 AM 05/18/2022    9:58 AM 04/19/2022    8:32 AM 04/19/2022    8:31 AM 11/11/2021   10:54 AM 11/04/2021    8:06 AM  Depression screen PHQ 2/9  Decreased Interest 0 0 1 1 0 1 1  Down, Depressed, Hopeless 0 0 1 1 0 3 2  PHQ - 2 Score 0 0  2 2 0 4 3  Altered sleeping    3  1 3   Tired, decreased energy    3  3 3   Change in appetite    2  2 1   Feeling bad or failure about yourself     1  2 0  Trouble concentrating    1  1 2   Moving slowly or fidgety/restless    1  0 0  Suicidal thoughts    0  0 0  PHQ-9 Score    13  13 12   Difficult doing work/chores    Somewhat difficult  Very difficult     Review of Systems  Constitutional: Negative.   HENT: Negative.    Eyes: Negative.   Respiratory: Negative.    Cardiovascular: Negative.   Gastrointestinal: Negative.   Endocrine: Negative.   Genitourinary: Negative.   Musculoskeletal:        Bilateral foot pain  Skin: Negative.   Allergic/Immunologic: Negative.   Neurological: Negative.   Hematological: Negative.   Psychiatric/Behavioral:  Positive for dysphoric mood.   All other systems reviewed and are negative.      Objective:   Physical Exam Vitals and nursing note reviewed.  Constitutional:      Appearance: Normal appearance. She is obese.  Cardiovascular:     Rate and Rhythm: Normal rate and regular rhythm.     Pulses: Normal pulses.     Heart sounds: Normal heart sounds.  Pulmonary:     Breath sounds: Wheezing present.  Musculoskeletal:     Comments: Normal Muscle Bulk and Muscle Testing Reveals:  Upper Extremities:Full  ROM and Muscle Strength 5/5  Lumbar Paraspinal  Tenderness: L-3-L-5 Lower Extremities: Full ROM and Muscle Strength 5/5 Arises from Table slowly Antalgic  Gait     Skin:    General: Skin is warm and dry.  Neurological:     Mental Status: She is alert and oriented to person, place, and time.  Psychiatric:        Mood and Affect: Mood normal.        Behavior: Behavior normal.           Assessment & Plan:  Chronic Bilateral Low Back Pain without Sciatica: Continue HEP as Tolerated. Continue to Monitor.  Polyneuropathy: Teresa Castaneda states when she received the last two treatments of Qutenza , she developed increase intensity of  a burning sensation, (  reports like fire), she is not interested in receiving Qutenza in the future. . Continue Gabapentin: PCP Following.   Refilled: Tramadol 50 mg two tablets every 6 hours as needed for pain #240. We will continue the opioid monitoring program, this consists of regular clinic visits, examinations, urine drug screen, pill counts as well as use of [place] Controlled Substance Reporting system. A 12 month History has been reviewed on the [place] Controlled Substance Reporting System on 12/20/2022  F/U in 5 months

## 2022-12-20 ENCOUNTER — Encounter: Payer: Self-pay | Admitting: Registered Nurse

## 2022-12-20 ENCOUNTER — Encounter: Payer: BC Managed Care – PPO | Attending: Physical Medicine and Rehabilitation | Admitting: Registered Nurse

## 2022-12-20 VITALS — BP 111/71 | HR 85 | Ht 66.0 in | Wt 228.2 lb

## 2022-12-20 DIAGNOSIS — G6289 Other specified polyneuropathies: Secondary | ICD-10-CM | POA: Diagnosis not present

## 2022-12-20 DIAGNOSIS — G894 Chronic pain syndrome: Secondary | ICD-10-CM | POA: Insufficient documentation

## 2022-12-20 DIAGNOSIS — G8929 Other chronic pain: Secondary | ICD-10-CM | POA: Insufficient documentation

## 2022-12-20 DIAGNOSIS — Z5181 Encounter for therapeutic drug level monitoring: Secondary | ICD-10-CM | POA: Insufficient documentation

## 2022-12-20 DIAGNOSIS — Z79891 Long term (current) use of opiate analgesic: Secondary | ICD-10-CM | POA: Diagnosis not present

## 2022-12-20 DIAGNOSIS — M545 Low back pain, unspecified: Secondary | ICD-10-CM | POA: Insufficient documentation

## 2022-12-20 MED ORDER — TRAMADOL HCL 50 MG PO TABS: 100.0000 mg | ORAL_TABLET | Freq: Four times a day (QID) | ORAL | 5 refills | Status: DC | PRN

## 2022-12-21 DIAGNOSIS — Z5181 Encounter for therapeutic drug level monitoring: Secondary | ICD-10-CM | POA: Diagnosis not present

## 2022-12-21 DIAGNOSIS — G894 Chronic pain syndrome: Secondary | ICD-10-CM | POA: Diagnosis not present

## 2022-12-21 DIAGNOSIS — Z79891 Long term (current) use of opiate analgesic: Secondary | ICD-10-CM | POA: Diagnosis not present

## 2022-12-23 DIAGNOSIS — M549 Dorsalgia, unspecified: Secondary | ICD-10-CM | POA: Diagnosis not present

## 2022-12-23 DIAGNOSIS — G9009 Other idiopathic peripheral autonomic neuropathy: Secondary | ICD-10-CM | POA: Diagnosis not present

## 2022-12-24 DIAGNOSIS — G9009 Other idiopathic peripheral autonomic neuropathy: Secondary | ICD-10-CM | POA: Diagnosis not present

## 2022-12-24 DIAGNOSIS — M549 Dorsalgia, unspecified: Secondary | ICD-10-CM | POA: Diagnosis not present

## 2022-12-25 ENCOUNTER — Other Ambulatory Visit: Payer: Self-pay | Admitting: Physical Medicine and Rehabilitation

## 2022-12-25 LAB — TOXASSURE SELECT,+ANTIDEPR,UR

## 2022-12-28 ENCOUNTER — Other Ambulatory Visit: Payer: Self-pay | Admitting: Family Medicine

## 2022-12-28 DIAGNOSIS — E1165 Type 2 diabetes mellitus with hyperglycemia: Secondary | ICD-10-CM

## 2023-01-05 ENCOUNTER — Other Ambulatory Visit: Payer: Self-pay

## 2023-01-05 DIAGNOSIS — K7460 Unspecified cirrhosis of liver: Secondary | ICD-10-CM

## 2023-01-09 ENCOUNTER — Ambulatory Visit (HOSPITAL_COMMUNITY)
Admission: RE | Admit: 2023-01-09 | Discharge: 2023-01-09 | Disposition: A | Discharge status: Home / Self Care | Payer: BC Managed Care – PPO | Source: Ambulatory Visit | Attending: Gastroenterology | Admitting: Gastroenterology

## 2023-01-09 DIAGNOSIS — K7581 Nonalcoholic steatohepatitis (NASH): Secondary | ICD-10-CM | POA: Insufficient documentation

## 2023-01-09 DIAGNOSIS — K7460 Unspecified cirrhosis of liver: Secondary | ICD-10-CM | POA: Insufficient documentation

## 2023-01-09 DIAGNOSIS — Z9049 Acquired absence of other specified parts of digestive tract: Secondary | ICD-10-CM | POA: Diagnosis not present

## 2023-01-09 DIAGNOSIS — R161 Splenomegaly, not elsewhere classified: Secondary | ICD-10-CM | POA: Diagnosis not present

## 2023-01-11 ENCOUNTER — Other Ambulatory Visit: Payer: Self-pay | Admitting: Family Medicine

## 2023-01-11 DIAGNOSIS — F411 Generalized anxiety disorder: Secondary | ICD-10-CM

## 2023-01-12 ENCOUNTER — Other Ambulatory Visit: Payer: Self-pay | Admitting: Pulmonary Disease

## 2023-01-12 DIAGNOSIS — J4540 Moderate persistent asthma, uncomplicated: Secondary | ICD-10-CM

## 2023-01-23 DIAGNOSIS — M549 Dorsalgia, unspecified: Secondary | ICD-10-CM | POA: Diagnosis not present

## 2023-01-23 DIAGNOSIS — G9009 Other idiopathic peripheral autonomic neuropathy: Secondary | ICD-10-CM | POA: Diagnosis not present

## 2023-01-24 DIAGNOSIS — G9009 Other idiopathic peripheral autonomic neuropathy: Secondary | ICD-10-CM | POA: Diagnosis not present

## 2023-01-24 DIAGNOSIS — M549 Dorsalgia, unspecified: Secondary | ICD-10-CM | POA: Diagnosis not present

## 2023-02-02 ENCOUNTER — Telehealth: Payer: Self-pay | Admitting: Pulmonary Disease

## 2023-02-02 DIAGNOSIS — J4540 Moderate persistent asthma, uncomplicated: Secondary | ICD-10-CM

## 2023-02-02 MED ORDER — ALBUTEROL SULFATE HFA 108 (90 BASE) MCG/ACT IN AERS: 2.0000 | INHALATION_SPRAY | Freq: Four times a day (QID) | RESPIRATORY_TRACT | 11 refills | Status: DC | PRN

## 2023-02-02 NOTE — Telephone Encounter (Signed)
Refill has been sent to confirmed pharmacy, pt has been left a VM as listed in [facility]

## 2023-02-02 NOTE — Telephone Encounter (Signed)
Patient states needs refill for Albuterol inhaler. Pharmacy is [facility]. Patient scheduled 02/06/2023 with Dr. Francine Graven. Patient out of medication. Patient phone number is [phone].

## 2023-02-06 ENCOUNTER — Ambulatory Visit: Payer: 59 | Admitting: Pulmonary Disease

## 2023-02-07 ENCOUNTER — Other Ambulatory Visit: Payer: Self-pay | Admitting: Family Medicine

## 2023-02-07 DIAGNOSIS — E1165 Type 2 diabetes mellitus with hyperglycemia: Secondary | ICD-10-CM

## 2023-02-23 ENCOUNTER — Ambulatory Visit: Payer: BC Managed Care – PPO | Admitting: Physical Medicine and Rehabilitation

## 2023-02-24 DIAGNOSIS — M549 Dorsalgia, unspecified: Secondary | ICD-10-CM | POA: Diagnosis not present

## 2023-02-24 DIAGNOSIS — G9009 Other idiopathic peripheral autonomic neuropathy: Secondary | ICD-10-CM | POA: Diagnosis not present

## 2023-03-12 ENCOUNTER — Encounter: Payer: Self-pay | Admitting: Physical Medicine and Rehabilitation

## 2023-03-13 ENCOUNTER — Telehealth: Payer: Self-pay

## 2023-03-13 ENCOUNTER — Other Ambulatory Visit: Payer: Self-pay | Admitting: Physical Medicine and Rehabilitation

## 2023-03-13 MED ORDER — TRAMADOL HCL 50 MG PO TABS: 100.0000 mg | ORAL_TABLET | Freq: Four times a day (QID) | ORAL | 5 refills | Status: DC | PRN

## 2023-03-13 NOTE — Telephone Encounter (Signed)
 Message left on patient's voice ID to call back . To the clinical voice line with a reply.   Horton Chin, MD to Me     03/13/23  9:30 AM Jash Wahlen can you please clarify with her whether she wants her new script for 6 or 8 pills per day? Thank you!   RP   03/13/23  9:07 AM You routed this conversation to Horton Chin, MD March 12, 2023 Cherylin Mylar Falotico to P [facility] Clinical Pool (supporting Horton Chin, MD)      03/12/23  5:43 PM I just talked to the pharmacist to get my medicine. Refilled, they're only covering 6 pills a day up to a 180 quantity. A month Mon was 8 pills a day, 240 quantity. They said they need a new preauthorization set in to get it covered.Is there any way you can help me with that. Thank you

## 2023-03-13 NOTE — Telephone Encounter (Signed)
 Pharmacy sent message stating insurance will cover 6 per day or can try a PA

## 2023-03-13 NOTE — Telephone Encounter (Signed)
 She is requesting a new Rx for 8 per day 240 per month.

## 2023-03-14 NOTE — Telephone Encounter (Signed)
 Submitted (Key: [facility]) Rx #: [id number]

## 2023-03-14 NOTE — Telephone Encounter (Signed)
 Outcome Approved today by [facility] NCPDP 2017 Your PA request has been approved. Additional information will be provided in the approval communication. (Message 1145) Effective Date: 03/13/2023 Authorization Expiration Date: 09/10/2023

## 2023-03-27 ENCOUNTER — Other Ambulatory Visit: Payer: Self-pay | Admitting: Family Medicine

## 2023-03-27 DIAGNOSIS — E1165 Type 2 diabetes mellitus with hyperglycemia: Secondary | ICD-10-CM

## 2023-03-27 NOTE — Telephone Encounter (Signed)
 Requested Prescriptions   Pending Prescriptions Disp Refills   gabapentin (NEURONTIN) 300 MG capsule [Pharmacy Med Name: Gabapentin 300 MG Oral Capsule] 240 capsule 0    Sig: TAKE 1 TO 2 CAPSULES BY MOUTH IN THE MORNING, 2 CAPSULES AT LUNCH AND 4 CAPSULES AT NIGHT     Date of patient request: 03/27/23 Last office visit: 05/12/2022 Upcoming visit: Visit date not found Date of last refill: 02/07/2023 Last refill amount: #240 0 refills

## 2023-03-29 NOTE — Telephone Encounter (Signed)
 Last visit with me in April 2024. Gabapentin last filled January 22.  Consistent with controlled substance database review.  She is on tramadol followed by pain management.  I will refill gabapentin, but needs to discuss further refills with pain management or needs office visit as last visit with me almost a year ago.  Temporary refill provided.

## 2023-04-04 ENCOUNTER — Telehealth: Payer: Self-pay

## 2023-04-04 ENCOUNTER — Other Ambulatory Visit (HOSPITAL_COMMUNITY): Payer: Self-pay

## 2023-04-04 NOTE — Telephone Encounter (Signed)
 Pharmacy Patient Advocate Encounter   Received notification from CoverMyMeds that prior authorization for [facility] is required/requested.   Insurance verification completed.   The patient is insured through [facility] .   Per test claim: PA required; PA submitted to above mentioned insurance via CoverMyMeds Key/confirmation #/EOC [id number] Status is pending

## 2023-04-10 NOTE — Telephone Encounter (Signed)
 Approved through 04/03/2026

## 2023-04-25 ENCOUNTER — Other Ambulatory Visit: Payer: Self-pay | Admitting: Family Medicine

## 2023-04-25 DIAGNOSIS — E1165 Type 2 diabetes mellitus with hyperglycemia: Secondary | ICD-10-CM

## 2023-04-25 NOTE — Telephone Encounter (Signed)
 Overdue for office visit.  Last visit with me in April 2024.  See notes on 03/29/2023, advised office visit needed at that time.  I do see that patient was called to make an appointment.  Temporary 1 month refill provided.  Needs office visit within the next 1 month, as beyond that time it will be over a year since her visit and will need to be seen to discuss or refill meds.  Thanks

## 2023-04-25 NOTE — Telephone Encounter (Signed)
 Called pt to make appt. Last appt 05/12/2022  Sent to PCP  Please advise on refill

## 2023-04-30 ENCOUNTER — Ambulatory Visit: Payer: BC Managed Care – PPO | Admitting: Internal Medicine

## 2023-04-30 ENCOUNTER — Encounter: Payer: Self-pay | Admitting: Internal Medicine

## 2023-04-30 VITALS — BP 122/62 | HR 81 | Resp 20 | Ht 66.0 in | Wt 210.8 lb

## 2023-04-30 DIAGNOSIS — E1140 Type 2 diabetes mellitus with diabetic neuropathy, unspecified: Secondary | ICD-10-CM

## 2023-04-30 DIAGNOSIS — E039 Hypothyroidism, unspecified: Secondary | ICD-10-CM | POA: Diagnosis not present

## 2023-04-30 DIAGNOSIS — E785 Hyperlipidemia, unspecified: Secondary | ICD-10-CM

## 2023-04-30 DIAGNOSIS — Z7984 Long term (current) use of oral hypoglycemic drugs: Secondary | ICD-10-CM | POA: Diagnosis not present

## 2023-04-30 DIAGNOSIS — Z7985 Long-term (current) use of injectable non-insulin antidiabetic drugs: Secondary | ICD-10-CM | POA: Diagnosis not present

## 2023-04-30 DIAGNOSIS — I700 Atherosclerosis of aorta: Secondary | ICD-10-CM

## 2023-04-30 LAB — GLUCOSE, POCT (MANUAL RESULT ENTRY): POC Glucose: 118 mg/dL — AB (ref 70–99)

## 2023-04-30 LAB — POCT GLYCOSYLATED HEMOGLOBIN (HGB A1C): Hemoglobin A1C: 5.4 % (ref 4.0–5.6)

## 2023-04-30 LAB — BASIC METABOLIC PANEL WITH GFR
BUN: 16 mg/dL (ref 7–25)
CO2: 26 mmol/L (ref 20–32)
Calcium: 9.2 mg/dL (ref 8.6–10.4)
Chloride: 107 mmol/L (ref 98–110)
Creat: 0.69 mg/dL (ref 0.50–1.03)
Glucose, Bld: 80 mg/dL (ref 65–99)
Potassium: 4.6 mmol/L (ref 3.5–5.3)
Sodium: 140 mmol/L (ref 135–146)
eGFR: 105 mL/min/{1.73_m2} (ref >=60)

## 2023-04-30 LAB — TSH: TSH: 3.67 m[IU]/L

## 2023-04-30 LAB — LIPID PANEL
Cholesterol: 139 mg/dL (ref <200)
HDL: 44 mg/dL — ABNORMAL LOW (ref >=50)
LDL Cholesterol (Calc): 77 mg/dL
Non-HDL Cholesterol (Calc): 95 mg/dL (ref <130)
Total CHOL/HDL Ratio: 3.2 (calc) (ref <5.0)
Triglycerides: 94 mg/dL (ref <150)

## 2023-04-30 LAB — T4, FREE: Free T4: 1.2 ng/dL (ref 0.8–1.8)

## 2023-04-30 MED ORDER — DAPAGLIFLOZIN PROPANEDIOL 10 MG PO TABS
10.0000 mg | ORAL_TABLET | Freq: Every day | ORAL | 3 refills | Status: DC
Start: 2023-04-30 — End: 2023-10-01

## 2023-04-30 MED ORDER — METFORMIN HCL ER 750 MG PO TB24
750.0000 mg | ORAL_TABLET | Freq: Every day | ORAL | 3 refills | Status: DC
Start: 2023-04-30 — End: 2023-10-30

## 2023-04-30 MED ORDER — TIRZEPATIDE 5 MG/0.5ML ~~LOC~~ SOAJ
5.0000 mg | SUBCUTANEOUS | 3 refills | Status: DC
Start: 2023-04-30 — End: 2023-10-30

## 2023-04-30 NOTE — Progress Notes (Unsigned)
 Name: Teresa Castaneda  Age/ Sex: 53 y.o., female   MRN/ DOB: [id number], 26-Aug-1970     PCP: Benjiman Bras, MD   Reason for Endocrinology Evaluation: Type 2 Diabetes Mellitus  Initial Endocrine Consultative Visit: 04/05/2020    PATIENT IDENTIFIER: Teresa Castaneda is a 53 y.o. female with a past medical history of T2DM, cirrhosis  and Hypothyroidism. The patient has followed with Endocrinology clinic since 04/05/2020 for consultative assistance with management of Teresa Castaneda diabetes.  DIABETIC HISTORY:  Teresa Castaneda was diagnosed with DM in 08/2019, Metformin started 08/2019. Teresa Castaneda hemoglobin A1c has ranged from 8.1% in 2022, peaking at 9.8% in 2022. Has mild upset stomach since increasing metformin but its transient    Glipizide started 02/2021 and discontinued 10/2022 with an A1c of 5.7%  Mounjaro started 10/2021   THYROID HISTORY: Teresa Castaneda has been diagnosed with hypothyroidism ~ 5 yrs ago No prior sx or radiation to the neck Teresa Castaneda was on Levothyroxine in he past but that didn't  work   Teresa Castaneda is on Armour thyroid 180 mg daily and 15 mg daily , we stopped 50 mg of Armour Thyroid which normalized Teresa Castaneda TSH.  Switched from armour to levothyroxine 04/2021 due to cost    SUBJECTIVE:   During the last visit (10/24/2022): A1c 5.7 %      Today (04/30/2023): Teresa Castaneda  is here for a follow up on diabetes and hypothyroid management. Teresa Castaneda checks Teresa Castaneda blood sugars 1 x daily  . The patient has not had hypoglycemic episodes since the last clinic visit  Patient follows up with [facility] for chronic pain, on Qutenza for neuropathic pain    Denies nausea or vomiting  Has loose stools that Teresa Castaneda attributes to eating dairy Denies local neck swelling  Denies palpitations   Teresa Castaneda continues to follow-up with GI for NASH cirrhosis Teresa Castaneda has been working 3rd shift    HOME ENDOCRINE  REGIMEN:  Metformin 850 mg BID  Farxiga 10 mg daily  Mounjaro 5 mg weekly Levothyroxine 175 mg daily        Statin: Yes ACE-I/ARB: No Prior Diabetic Education: No   METER DOWNLOAD SUMMARY: n/a   DIABETIC COMPLICATIONS: Microvascular complications:  Neuropathy Denies: CKD, retinopathy Last Eye Exam: Completed 03/2020  Macrovascular complications:   Denies: CAD, CVA, PVD   HISTORY:  Past Medical History:  Past Medical History:  Diagnosis Date   Allergies    Anxiety    Arthritis    Asthma    Back pain    Spinal Issue    Diabetes mellitus without complication (HCC)    DJD (degenerative joint disease), lumbar    Elevated cholesterol    Gallstone    GERD (gastroesophageal reflux disease)    Liver cirrhosis secondary to NASH (HCC)    Neuromuscular disorder (HCC)    Obesity    Oxygen deficiency    Thyroid disease    Past Surgical History:  Past Surgical History:  Procedure Laterality Date   ABDOMINAL HYSTERECTOMY  02/21/2003   CHOLECYSTECTOMY N/A 05/10/2020   Procedure: LAPAROSCOPIC CHOLECYSTECTOMY WITH INTRAOPERATIVE CHOLANGIOGRAM AND LIVER BIOPSY;  Surgeon: Junie Olds, MD;  Location: [facility];  Service: General;  Laterality: N/A;   DG SACROILIAC JOINTS ([facility] HX)  01/11/2021   [facility]   ENDOSCOPIC TURBINATE REDUCTION Bilateral 10/13/2020   Procedure: ENDOSCOPIC BILATERAL INFERIOR TURBINATE REDUCTIONS;  Surgeon: Prescott Brodie, MD;  Location: [facility];  Service: ENT;  Laterality: Bilateral;   ETHMOIDECTOMY  Bilateral 10/13/2020   Procedure: BILATERAL TOTAL ETHMOIDECTOMY AND MAXILLARY OSTIA ENLARGEMENTS;  Surgeon: Drema Halon, MD;  Location: [facility];  Service: ENT;  Laterality: Bilateral;   LIVER BIOPSY     NASAL SINUS SURGERY Bilateral 10/13/2020   Procedure: SINUS ENDOSCOPY WITH STEALTH NAVIGATION;  Surgeon: Drema Halon, MD;  Location:  SURGERY CENTER;  Service: ENT;  Laterality: Bilateral;   TONSILLECTOMY     TOTAL ABDOMINAL HYSTERECTOMY     Social History:  reports  that Teresa Castaneda has been smoking cigarettes. Teresa Castaneda started smoking about 38 years ago. Teresa Castaneda has a 17.4 pack-year smoking history. Teresa Castaneda has never used smokeless tobacco. Teresa Castaneda reports that Teresa Castaneda does not drink alcohol and does not use drugs. Family History:  Family History  Problem Relation Age of Onset   Coronary artery disease Mother    Diabetes Mother    COPD Mother    Coronary artery disease Father    Hypertension Father    Colon cancer Neg Hx    Esophageal cancer Neg Hx      HOME MEDICATIONS: Allergies as of 04/30/2023       Reactions   Clarithromycin Other (See Comments)   Body cramps    Codeine Nausea And Vomiting   Tylenol [acetaminophen] Other (See Comments)   Can not take Tylenol products due to liver   Penicillins Rash        Medication List        Accurate as of April 30, 2023  7:50 AM. If you have any questions, ask your nurse or doctor.          albuterol 108 (90 Base) MCG/ACT inhaler Commonly known as: VENTOLIN HFA Inhale 2 puffs into the lungs every 6 (six) hours as needed for wheezing or shortness of breath.   amitriptyline 10 MG tablet Commonly known as: ELAVIL TAKE 1 TABLET BY MOUTH AT BEDTIME   atorvastatin 10 MG tablet Commonly known as: LIPITOR Take 1 tablet (10 mg total) by mouth daily.   DULoxetine 60 MG capsule Commonly known as: CYMBALTA Take 2 capsules by mouth once daily   Farxiga 10 MG Tabs tablet Generic drug: dapagliflozin propanediol Take 1 tablet (10 mg total) by mouth daily.   furosemide 20 MG tablet Commonly known as: Lasix Take 1 tablet (20 mg total) by mouth 2 (two) times daily.   gabapentin 300 MG capsule Commonly known as: NEURONTIN TAKE 1 TO 2 CAPSULES BY MOUTH IN THE MORNING AND 2 CAPSULES AT LUNCH AND 4 CAPSULES AT NIGHT   hydrOXYzine 25 MG tablet Commonly known as: ATARAX TAKE 1 TABLET BY MOUTH EVERY 6 HOURS UP TO THREE TIMES DAILY AS NEEDED   ibuprofen 600 MG tablet Commonly known as: ADVIL Take 1 tablet (600 mg total)  by mouth 2 (two) times daily as needed.   levocetirizine 5 MG tablet Commonly known as: XYZAL Take 1 tablet (5 mg total) by mouth every evening.   levothyroxine 175 MCG tablet Commonly known as: SYNTHROID Take 1 tablet (175 mcg total) by mouth daily.   lidocaine 5 % Commonly known as: LIDODERM USE 1 PATCH EXTERNALLY ONCE DAILY REMOVE  AND  DISCARD  PATCH  WITH  12  HOURS  OR  AS  DIRECTED  BY  MD   metFORMIN 850 MG tablet Commonly known as: GLUCOPHAGE Take 1 tablet (850 mg total) by mouth 2 (two) times daily with a meal.   omeprazole 40 MG capsule Commonly known as: PRILOSEC Take 1 capsule (40  mg total) by mouth daily.   rifaximin 550 MG Tabs tablet Commonly known as: Xifaxan Take 1 tablet (550 mg total) by mouth 2 (two) times daily. What changed: when to take this   tirzepatide 5 MG/0.5ML Pen Commonly known as: MOUNJARO Inject 5 mg into the skin once a week.   topiramate 25 MG tablet Commonly known as: Topamax Take 1 tablet (25 mg total) by mouth daily.   traMADol 50 MG tablet Commonly known as: Ultram Take 2 tablets (100 mg total) by mouth every 6 (six) hours as needed.   Trelegy Ellipta 100-62.5-25 MCG/ACT Aepb Generic drug: Fluticasone-Umeclidin-Vilant INHALE 1 PUFF IN TO THE LUNGS DAILY . APPOINTMENT REQUIRED FOR FUTURE REFILLS   triamcinolone 55 MCG/ACT Aero nasal inhaler Commonly known as: NASACORT Place 2 sprays into the nose daily.   Vitamin D (Ergocalciferol) 1.25 MG (50000 UNIT) Caps capsule Commonly known as: DRISDOL Take 50,000 Units by mouth once a week.         OBJECTIVE:   Vital Signs: BP 122/62 (BP Location: Left Arm, Patient Position: Sitting, Cuff Size: Large)   Pulse 81   Resp 20   Ht 5\' 6"  (1.676 m)   Wt 210 lb 12.8 oz (95.6 kg)   SpO2 97%   BMI 34.02 kg/m   Wt Readings from Last 3 Encounters:  04/30/23 210 lb 12.8 oz (95.6 kg)  12/20/22 228 lb 3.2 oz (103.5 kg)  11/20/22 229 lb (103.9 kg)     Exam: General: Teresa Castaneda appears  well and is in NAD  Neck: General: Supple without adenopathy. Thyroid:  No goiter or nodules appreciated.   Lungs: Clear with good BS bilat with no rales, rhonchi, or wheezes  Heart: RRR   Extremities: Trace edema   Neuro: MS is good with appropriate affect, Teresa Castaneda is alert and Ox3   DM Foot Exam 04/30/2023 The skin of the feet is intact without sores or ulcerations. The pedal pulses are 2+ on right and 2+ on left. The sensation is decreased  to a screening 5.07, 10 gram monofilament bilaterally   DATA REVIEWED:   Lab Results  Component Value Date   HGBA1C 5.4 04/30/2023   HGBA1C 5.7 (A) 10/24/2022   HGBA1C 6.6 (A) 04/24/2022    Latest Reference Range & Units 10/24/22 08:20  TSH 0.35 - 5.50 uIU/mL 16.13 (H)  (H): Data is abnormally high  Latest Reference Range & Units 08/30/21 10:06  Sodium 135 - 145 mEq/L 138  Potassium 3.5 - 5.1 mEq/L 3.9  Chloride 96 - 112 mEq/L 103  CO2 19 - 32 mEq/L 27  Glucose 70 - 99 mg/dL 409 (H)  BUN 6 - 23 mg/dL 6  Creatinine 8.11 - 9.14 mg/dL 7.82  Calcium 8.4 - 95.6 mg/dL 9.0  Alkaline Phosphatase 39 - 117 U/L 81  Albumin 3.5 - 5.2 g/dL 3.5  AST 0 - 37 U/L 39 (H)  ALT 0 - 35 U/L 29  Total Protein 6.0 - 8.3 g/dL 7.3  Total Bilirubin 0.2 - 1.2 mg/dL 0.7  GFR >21.30 mL/min 110.41    Latest Reference Range & Units 10/21/21 08:43  TSH 0.35 - 5.50 uIU/mL 1.80  T4,Free(Direct) 0.60 - 1.60 ng/dL 8.65    In office BG 784 mg/dL   ASSESSMENT / PLAN / RECOMMENDATIONS:   1) Type 2 Diabetes Mellitus, Optimally  With Neuropathic complications - Most recent A1c of 5.4%. Goal A1c < 7.0 %.     - Praised the Teresa Castaneda on improved glycemic control  -Intolerant to  higher doses of Metformin , Teresa Castaneda has self decrease metformin to once daily due to loose stools, I will change the dose to XR formulation as below -Would like to remain on current dose of Mounjaro, as Teresa Castaneda A1c is optimal and Teresa Castaneda continues to lose weight  MEDICATIONS: Change Metformin 750 mg, 1 tablet  with  Continue Farxiga 10  mg daily  Continue  Mounjaro  mg weekly   EDUCATION / INSTRUCTIONS: BG monitoring instructions: Patient is instructed to check Teresa Castaneda blood sugars 1 times a day, fasting. Call [facility] if: BG persistently < 70  I reviewed the Rule of 15 for the treatment of hypoglycemia in detail with the patient. Literature supplied.    2) Diabetic complications:  Eye: Does not have known diabetic retinopathy.  Neuro/ Feet: Does not have known diabetic peripheral neuropathy .  Renal: Patient does not have known baseline CKD. Teresa Castaneda   is not on an ACEI/ARB at present.    3) Hypothyroidism:   - Teresa Castaneda is clinically euthyroid  - Teresa Castaneda does admit to imperfect adherence to levothyroxine over the past week when Teresa Castaneda was on vacation -We again discussed the importance of compliance with levothyroxine intake, and that Teresa Castaneda may double up on the dose should Teresa Castaneda forget 1 day   Medication   levothyroxine 175 mcg daily   F/U in 6 months   Signed electronically by: Natale Bail, MD  [facility]  [facility] [address] [place] [place], [place] [address] Phone: [phone] FAX: [phone]   CC: Benjiman Bras, MD [id number] [place] [address] Phone: [phone]  Fax: [phone]  Return to Endocrinology clinic as below: Future Appointments  Date Time Provider Department Center  06/29/2023  8:20 AM Jodi Munroe, NP [facility]

## 2023-04-30 NOTE — Patient Instructions (Signed)
-   Change Metformin 750 mg, 1 tablet with Breakfast - Continue Farxiga 10 mg daily  - Continue  Mounjaro 5 mg weekly      HOW TO TREAT LOW BLOOD SUGARS (Blood sugar LESS THAN 70 MG/DL) Please follow the RULE OF 15 for the treatment of hypoglycemia treatment (when your (blood sugars are less than 70 mg/dL)   STEP 1: Take 15 grams of carbohydrates when your blood sugar is low, which includes:  3-4 GLUCOSE TABS  OR 3-4 OZ OF JUICE OR REGULAR SODA OR ONE TUBE OF GLUCOSE GEL    STEP 2: RECHECK blood sugar in 15 MINUTES STEP 3: If your blood sugar is still low at the 15 minute recheck --> then, go back to STEP 1 and treat AGAIN with another 15 grams of carbohydrates.

## 2023-05-01 ENCOUNTER — Encounter: Payer: Self-pay | Admitting: Internal Medicine

## 2023-05-01 MED ORDER — ATORVASTATIN CALCIUM 10 MG PO TABS: 10.0000 mg | ORAL_TABLET | Freq: Every day | ORAL | 3 refills | Status: AC

## 2023-05-01 MED ORDER — LEVOTHYROXINE SODIUM 175 MCG PO TABS
175.0000 ug | ORAL_TABLET | Freq: Every day | ORAL | 3 refills | Status: DC
Start: 2023-05-01 — End: 2023-10-31

## 2023-05-29 ENCOUNTER — Other Ambulatory Visit: Payer: Self-pay | Admitting: Family Medicine

## 2023-05-29 DIAGNOSIS — E1165 Type 2 diabetes mellitus with hyperglycemia: Secondary | ICD-10-CM

## 2023-06-06 ENCOUNTER — Other Ambulatory Visit: Payer: Self-pay | Admitting: Internal Medicine

## 2023-06-28 ENCOUNTER — Encounter: Payer: Self-pay | Admitting: Registered Nurse

## 2023-06-28 ENCOUNTER — Encounter: Attending: Registered Nurse | Admitting: Registered Nurse

## 2023-06-28 VITALS — BP 102/64 | HR 73 | Ht 66.0 in | Wt 209.0 lb

## 2023-06-28 DIAGNOSIS — G894 Chronic pain syndrome: Secondary | ICD-10-CM | POA: Diagnosis present

## 2023-06-28 DIAGNOSIS — G6289 Other specified polyneuropathies: Secondary | ICD-10-CM | POA: Insufficient documentation

## 2023-06-28 DIAGNOSIS — G8929 Other chronic pain: Secondary | ICD-10-CM | POA: Diagnosis present

## 2023-06-28 DIAGNOSIS — Z79891 Long term (current) use of opiate analgesic: Secondary | ICD-10-CM | POA: Insufficient documentation

## 2023-06-28 DIAGNOSIS — Z5181 Encounter for therapeutic drug level monitoring: Secondary | ICD-10-CM | POA: Insufficient documentation

## 2023-06-28 DIAGNOSIS — M545 Low back pain, unspecified: Secondary | ICD-10-CM | POA: Diagnosis not present

## 2023-06-28 NOTE — Progress Notes (Signed)
 Subjective:    Patient ID: Teresa Castaneda, female    DOB: 05/31/1970, 53 y.o.   MRN: [id number]  HPI: Teresa Castaneda is a 53 y.o. female who returns for follow up appointment for chronic pain and medication refill. She states her pain is located in her lower back and bilateral feet with tingling and burning. She rates her pain 7. Her current exercise regime is walking and performing stretching exercises.  Ms. Pontiff Morphine  equivalent is 80.00 MME.  UDS ordered Today.      Pain Inventory Average Pain 5 Pain Right Now 7 My pain is constant, sharp, burning, stabbing, and tingling  In the last 24 hours, has pain interfered with the following? General activity 5 Relation with others 6 Enjoyment of life 7 What TIME of day is your pain at its worst? morning , daytime, evening, and varies Sleep (in general) Fair  Pain is worse with: walking and some activites Pain improves with: rest, heat/ice, and medication Relief from Meds: 7  Family History  Problem Relation Age of Onset   Coronary artery disease Mother    Diabetes Mother    COPD Mother    Coronary artery disease Father    Hypertension Father    Colon cancer Neg Hx    Esophageal cancer Neg Hx    Social History   Socioeconomic History   Marital status: Married    Spouse name: Not on file   Number of children: Not on file   Years of education: Not on file   Highest education level: 9th grade  Occupational History   Occupation: Development worker, international aid  Tobacco Use   Smoking status: Some Days    Current packs/day: 0.00    Average packs/day: 0.5 packs/day for 34.7 years (17.4 ttl pk-yrs)    Types: Cigarettes    Start date: 01/16/1985    Last attempt to quit: 10/08/2019    Years since quitting: 3.7   Smokeless tobacco: Never   Tobacco comments:    7- 8 cig a week  Vaping Use   Vaping status: Never Used  Substance and Sexual Activity   Alcohol use: No   Drug use: No   Sexual activity: Yes    Birth control/protection:  Surgical  Other Topics Concern   Not on file  Social History Narrative   Right Handed    Caffeine rare   Live with husband and mother in law in a two story hpme   Social Drivers of Health   Financial Resource Strain: Not on file  Food Insecurity: Food Insecurity Present (05/12/2022)   Hunger Vital Sign    Worried About Running Out of Food in the Last Year: Never true    Ran Out of Food in the Last Year: Sometimes true  Transportation Needs: No Transportation Needs (05/12/2022)   PRAPARE - Administrator, Civil Service (Medical): No    Lack of Transportation (Non-Medical): No  Physical Activity: Unknown (05/12/2022)   Exercise Vital Sign    Days of Exercise per Week: 1 day    Minutes of Exercise per Session: Patient declined  Stress: Stress Concern Present (05/12/2022)   [employer] of Occupational Health - Occupational Stress Questionnaire    Feeling of Stress : Very much  Social Connections: Moderately Integrated (05/12/2022)   Social Connection and Isolation Panel    Frequency of Communication with Friends and Family: Twice a week    Frequency of Social Gatherings with Friends and Family: Twice a week  Attends Religious Services: More than 4 times per year    Active Member of Clubs or Organizations: No    Attends Banker Meetings: Not on file    Marital Status: Married   Past Surgical History:  Procedure Laterality Date   ABDOMINAL HYSTERECTOMY  02/21/2003   CHOLECYSTECTOMY N/A 05/10/2020   Procedure: LAPAROSCOPIC CHOLECYSTECTOMY WITH INTRAOPERATIVE CHOLANGIOGRAM AND LIVER BIOPSY;  Surgeon: Junie Olds, MD;  Location: [facility];  Service: General;  Laterality: N/A;   DG SACROILIAC JOINTS ([facility] HX)  01/11/2021   [facility]   ENDOSCOPIC TURBINATE REDUCTION Bilateral 10/13/2020   Procedure: ENDOSCOPIC BILATERAL INFERIOR TURBINATE REDUCTIONS;  Surgeon: Prescott Brodie, MD;  Location: [facility];  Service: ENT;   Laterality: Bilateral;   ETHMOIDECTOMY Bilateral 10/13/2020   Procedure: BILATERAL TOTAL ETHMOIDECTOMY AND MAXILLARY OSTIA ENLARGEMENTS;  Surgeon: Prescott Brodie, MD;  Location: [facility];  Service: ENT;  Laterality: Bilateral;   LIVER BIOPSY     NASAL SINUS SURGERY Bilateral 10/13/2020   Procedure: SINUS ENDOSCOPY WITH STEALTH NAVIGATION;  Surgeon: Prescott Brodie, MD;  Location: [facility];  Service: ENT;  Laterality: Bilateral;   TONSILLECTOMY     TOTAL ABDOMINAL HYSTERECTOMY     Past Surgical History:  Procedure Laterality Date   ABDOMINAL HYSTERECTOMY  02/21/2003   CHOLECYSTECTOMY N/A 05/10/2020   Procedure: LAPAROSCOPIC CHOLECYSTECTOMY WITH INTRAOPERATIVE CHOLANGIOGRAM AND LIVER BIOPSY;  Surgeon: Junie Olds, MD;  Location: [facility];  Service: General;  Laterality: N/A;   DG SACROILIAC JOINTS ([facility] HX)  01/11/2021   [facility]   ENDOSCOPIC TURBINATE REDUCTION Bilateral 10/13/2020   Procedure: ENDOSCOPIC BILATERAL INFERIOR TURBINATE REDUCTIONS;  Surgeon: Prescott Brodie, MD;  Location: [facility];  Service: ENT;  Laterality: Bilateral;   ETHMOIDECTOMY Bilateral 10/13/2020   Procedure: BILATERAL TOTAL ETHMOIDECTOMY AND MAXILLARY OSTIA ENLARGEMENTS;  Surgeon: Prescott Brodie, MD;  Location: [facility];  Service: ENT;  Laterality: Bilateral;   LIVER BIOPSY     NASAL SINUS SURGERY Bilateral 10/13/2020   Procedure: SINUS ENDOSCOPY WITH STEALTH NAVIGATION;  Surgeon: Prescott Brodie, MD;  Location: [facility];  Service: ENT;  Laterality: Bilateral;   TONSILLECTOMY     TOTAL ABDOMINAL HYSTERECTOMY     Past Medical History:  Diagnosis Date   Allergies    Anxiety    Arthritis    Asthma    Back pain    Spinal Issue    Diabetes mellitus without complication (HCC)    DJD (degenerative joint disease), lumbar    Elevated cholesterol    Gallstone    GERD  (gastroesophageal reflux disease)    Liver cirrhosis secondary to NASH (HCC)    Neuromuscular disorder (HCC)    Obesity    Oxygen deficiency    Thyroid  disease    BP 102/64   Pulse 73   Ht 5' 6 (1.676 m)   Wt 209 lb (94.8 kg)   SpO2 96%   BMI 33.73 kg/m   Opioid Risk Score:   Fall Risk Score:  `1  Depression screen [facility] 2/9     12/20/2022    9:01 AM 11/20/2022    9:06 AM 08/18/2022   10:59 AM 05/18/2022    9:58 AM 04/19/2022    8:32 AM 04/19/2022    8:31 AM 11/11/2021   10:54 AM  Depression screen PHQ 2/9  Decreased Interest 3 0 0 1 1 0 1  Down, Depressed, Hopeless 3 0  0 1 1 0 3  PHQ - 2 Score 6 0 0 2 2 0 4  Altered sleeping     3  1  Tired, decreased energy     3  3  Change in appetite     2  2  Feeling bad or failure about yourself      1  2  Trouble concentrating     1  1  Moving slowly or fidgety/restless     1  0  Suicidal thoughts     0  0  PHQ-9 Score     13  13  Difficult doing work/chores     Somewhat difficult  Very difficult     Review of Systems  Musculoskeletal:        Pain in feet and hands  All other systems reviewed and are negative.      Objective:   Physical Exam Vitals and nursing note reviewed.  Constitutional:      Appearance: Normal appearance.   Cardiovascular:     Rate and Rhythm: Normal rate and regular rhythm.     Pulses: Normal pulses.     Heart sounds: Normal heart sounds.  Pulmonary:     Effort: Pulmonary effort is normal.     Breath sounds: Normal breath sounds.   Musculoskeletal:     Comments: Normal Muscle Bulk and Muscle Testing Reveals:  Upper Extremities: Right: Full ROM and Muscle Strength 5/5 Let: Decreased ROM 90 Degrees and Muscle Strength 5/5 Lumbar Hypersensitivity Lower Extremities: Full ROM and Muscle Strength 5/5 Arises from chair slowly Antalgic  Gait      Skin:    General: Skin is warm and dry.   Neurological:     Mental Status: She is alert and oriented to person, place, and time.   Psychiatric:         Mood and Affect: Mood normal.        Behavior: Behavior normal.         Assessment & Plan:  Chronic Bilateral Low Back Pain without Sciatica: Continue HEP as Tolerated. Continue to Monitor. 06/28/2023 Polyneuropathy: Teresa Castaneda states when she received the last two treatments of Qutenza  , she developed increase intensity of  a burning sensation, (  reports like fire), she is not interested in receiving Qutenza  in the future. . Continue Gabapentin : PCP Following.  06/28/2023 Continue  Tramadol  50 mg two tablets every 6 hours as needed for pain #240. We will continue the opioid monitoring program, this consists of regular clinic visits, examinations, urine drug screen, pill counts as well as use of [place]  Controlled Substance Reporting system. A 12 month History has been reviewed on the   Controlled Substance Reporting System on 06/28/2023   F/U in 6 months

## 2023-06-29 ENCOUNTER — Ambulatory Visit: Payer: BC Managed Care – PPO | Admitting: Registered Nurse

## 2023-07-01 LAB — TOXASSURE SELECT,+ANTIDEPR,UR

## 2023-07-02 ENCOUNTER — Other Ambulatory Visit: Payer: Self-pay | Admitting: Family Medicine

## 2023-07-02 DIAGNOSIS — E1165 Type 2 diabetes mellitus with hyperglycemia: Secondary | ICD-10-CM

## 2023-07-06 ENCOUNTER — Ambulatory Visit: Admitting: Family Medicine

## 2023-07-06 VITALS — BP 110/68 | HR 87 | Temp 98.0°F | Resp 16 | Ht 66.0 in | Wt 200.0 lb

## 2023-07-06 DIAGNOSIS — J329 Chronic sinusitis, unspecified: Secondary | ICD-10-CM | POA: Diagnosis not present

## 2023-07-06 DIAGNOSIS — B9689 Other specified bacterial agents as the cause of diseases classified elsewhere: Secondary | ICD-10-CM | POA: Diagnosis not present

## 2023-07-06 MED ORDER — DOXYCYCLINE HYCLATE 100 MG PO TABS: 100.0000 mg | ORAL_TABLET | Freq: Two times a day (BID) | ORAL | 0 refills | Status: DC

## 2023-07-06 NOTE — Progress Notes (Signed)
   Subjective:    Patient ID: Teresa Castaneda, female    DOB: 06-18-1970, 53 y.o.   MRN: [id number]  HPI URI- 'i've always have allergies and sinuses' but started feeling fatigued and overall poorly starting 2 days ago.  No fevers.  + HA.  + frontal sinus pressure.  Bilateral ear fullness.  + nasal congestion.  Nasal drainage is green and bloody.  No tooth pain.  + cough due to drainage.   Review of Systems For ROS see HPI     Objective:   Physical Exam Constitutional:      General: She is not in acute distress.    Appearance: She is well-developed.  HENT:     Head: Normocephalic and atraumatic.     Right Ear: Tympanic membrane normal.     Left Ear: Tympanic membrane normal.     Nose: Mucosal edema, congestion and rhinorrhea present.     Right Sinus: Maxillary sinus tenderness and frontal sinus tenderness present.     Left Sinus: Maxillary sinus tenderness and frontal sinus tenderness present.     Mouth/Throat:     Pharynx: Uvula midline. Posterior oropharyngeal erythema present. No oropharyngeal exudate.   Eyes:     Conjunctiva/sclera: Conjunctivae normal.     Pupils: Pupils are equal, round, and reactive to light.    Cardiovascular:     Rate and Rhythm: Normal rate and regular rhythm.     Heart sounds: Normal heart sounds.  Pulmonary:     Effort: Pulmonary effort is normal. No respiratory distress.     Breath sounds: Wheezing (upper lobes bilaterally) present.   Musculoskeletal:     Cervical back: Normal range of motion and neck supple.  Lymphadenopathy:     Cervical: No cervical adenopathy.   Skin:    General: Skin is warm and dry.   Neurological:     General: No focal deficit present.     Mental Status: She is oriented to person, place, and time.     Cranial Nerves: No cranial nerve deficit.     Motor: No weakness.     Coordination: Coordination normal.   Psychiatric:        Mood and Affect: Mood normal.        Behavior: Behavior normal.        Thought  Content: Thought content normal.           Assessment & Plan:  Bacterial sinusitis- new.  Pt's sxs and PE consistent w/ infxn.  She is at higher risk for bacterial infxn w/ smoking hx, asthma, DM.  Start Doxycycline .  Reviewed supportive care and red flags that should prompt return.  Pt expressed understanding and is in agreement w/ plan.

## 2023-07-06 NOTE — Patient Instructions (Signed)
 Follow up as needed or as scheduled START the Doxycycline  twice daily- take w/ food Drink LOTS of fluids REST! Continue to use your asthma medications and add a daily Claritin or Zyrtec to help w/ congestion/drainage Call with any questions or concerns Hang in there!!!

## 2023-07-13 ENCOUNTER — Other Ambulatory Visit: Payer: Self-pay | Admitting: Family Medicine

## 2023-07-13 ENCOUNTER — Ambulatory Visit: Payer: Self-pay

## 2023-07-13 DIAGNOSIS — E1165 Type 2 diabetes mellitus with hyperglycemia: Secondary | ICD-10-CM

## 2023-07-13 NOTE — Telephone Encounter (Signed)
 FYI Only or Action Required?: Action required by provider: would like another prescription for ongoing symptoms of sinus infection.  Patient was last seen in primary care on 07/06/2023 by Mahlon Comer BRAVO, MD. Called Nurse Triage reporting Sinusitis. Symptoms began a week ago. Interventions attempted: Nothing. Symptoms are: unchanged.  Triage Disposition: See PCP When Office is Open (Within 3 Days)  Patient/caregiver understands and will follow disposition?: No, wishes to speak with PCP    Copied from CRM [id number]. Topic: Clinical - Red Word Triage >> Jul 13, 2023 12:14 PM Macario HERO wrote: Red Word that prompted transfer to Nurse Triage: Patient stated that she has a sinus infection, blowing out blood, migraine,  swollen face and jaw is sore. Reason for Disposition  [1] Sinus congestion (pressure, fullness) AND [2] present > 10 days  Answer Assessment - Initial Assessment Questions 1. LOCATION: Where does it hurt?      States took last antibiotic today, 2. ONSET: When did the sinus pain start?  (e.g., hours, days)      A week ago 3. SEVERITY: How bad is the pain?   (Scale 1-10; mild, moderate or severe)   - MILD (1-3): doesn't interfere with normal activities    - MODERATE (4-7): interferes with normal activities (e.g., work or school) or awakens from sleep   - SEVERE (8-10): excruciating pain and patient unable to do any normal activities        mild 4. RECURRENT SYMPTOM: Have you ever had sinus problems before? If Yes, ask: When was the last time? and What happened that time?      yes 5. NASAL CONGESTION: Is the nose blocked? If Yes, ask: Can you open it or must you breathe through your mouth?     yes 6. NASAL DISCHARGE: Do you have discharge from your nose? If so ask, What color?     Bloody, green 7. FEVER: Do you have a fever? If Yes, ask: What is it, how was it measured, and when did it start?      no 8. OTHER SYMPTOMS: Do you have any other symptoms?  (e.g., sore throat, cough, earache, difficulty breathing)     Jaw hurts 9. PREGNANCY: Is there any chance you are pregnant? When was your last menstrual period?     na  Protocols used: Sinus Pain or Congestion-A-AH

## 2023-07-13 NOTE — Telephone Encounter (Signed)
 Called patient no answer, requested she call back for advice from Dr Levora

## 2023-07-13 NOTE — Telephone Encounter (Signed)
 Patient was seen recently for sinusitis and is requesting different med for more relief

## 2023-07-13 NOTE — Telephone Encounter (Signed)
 Triage note reviewed.  It can take a while for sinus infections to improve, doxycycline  is reasonable since she had penicillin allergy.  However I am concerned with her symptoms stating swelling in her face and soreness of her jaw .with those symptoms I would recommend she be evaluated by medical provider today or urgent care.

## 2023-07-16 NOTE — Telephone Encounter (Signed)
Called patient again no answer, LM to call back.

## 2023-07-17 NOTE — Telephone Encounter (Signed)
LM to call back to discuss.

## 2023-07-18 NOTE — Telephone Encounter (Signed)
 Called pharmacy and patient did pick up the new rx

## 2023-07-18 NOTE — Telephone Encounter (Signed)
 Call pharmacy and see if patient picked up the new Rx Open at 9:00am

## 2023-08-11 ENCOUNTER — Other Ambulatory Visit: Payer: Self-pay | Admitting: Gastroenterology

## 2023-08-23 ENCOUNTER — Other Ambulatory Visit: Payer: Self-pay | Admitting: Family Medicine

## 2023-08-23 DIAGNOSIS — E1165 Type 2 diabetes mellitus with hyperglycemia: Secondary | ICD-10-CM

## 2023-09-23 ENCOUNTER — Other Ambulatory Visit: Payer: Self-pay | Admitting: Family Medicine

## 2023-09-23 DIAGNOSIS — E1165 Type 2 diabetes mellitus with hyperglycemia: Secondary | ICD-10-CM

## 2023-09-24 NOTE — Telephone Encounter (Signed)
 Patient has been scheduled

## 2023-10-01 ENCOUNTER — Ambulatory Visit: Admitting: Family Medicine

## 2023-10-01 VITALS — BP 120/86 | HR 95 | Temp 98.0°F | Resp 14 | Ht 66.0 in | Wt 203.4 lb

## 2023-10-01 DIAGNOSIS — J329 Chronic sinusitis, unspecified: Secondary | ICD-10-CM | POA: Diagnosis not present

## 2023-10-01 DIAGNOSIS — J4540 Moderate persistent asthma, uncomplicated: Secondary | ICD-10-CM | POA: Diagnosis not present

## 2023-10-01 DIAGNOSIS — E1165 Type 2 diabetes mellitus with hyperglycemia: Secondary | ICD-10-CM | POA: Diagnosis not present

## 2023-10-01 DIAGNOSIS — E1140 Type 2 diabetes mellitus with diabetic neuropathy, unspecified: Secondary | ICD-10-CM

## 2023-10-01 MED ORDER — TRELEGY ELLIPTA 100-62.5-25 MCG/ACT IN AEPB: 1.0000 | INHALATION_SPRAY | Freq: Every day | RESPIRATORY_TRACT | 5 refills | Status: AC

## 2023-10-01 MED ORDER — GABAPENTIN 300 MG PO CAPS: ORAL_CAPSULE | ORAL | 1 refills | Status: DC

## 2023-10-01 MED ORDER — ALBUTEROL SULFATE HFA 108 (90 BASE) MCG/ACT IN AERS: 2.0000 | INHALATION_SPRAY | Freq: Four times a day (QID) | RESPIRATORY_TRACT | 11 refills | Status: AC | PRN

## 2023-10-01 MED ORDER — FUROSEMIDE 20 MG PO TABS: 20.0000 mg | ORAL_TABLET | Freq: Two times a day (BID) | ORAL | 0 refills | Status: DC

## 2023-10-01 NOTE — Patient Instructions (Signed)
 Thank you for coming in today. No change in medications at this time.  I did refill the furosemide  temporarily but call Dr. Charlanne for follow-up as they prescribe that medication previously.  I also refilled the Trelegy even though that was prescribed by pulmonary previously.  If you require albuterol  more frequently please follow-up with myself or pulmonary to discuss change in plan for asthma.  As we discussed I am concerned about your recurrent sinus infections and sinus congestion and that likely is a component of chronic sinusitis.  That needs to be treated by ear nose and throat specialist, especially with your previous history of surgery.  Please let me know if you change your mind on that referral and I am happy to place one without an office visit.  Looking back at the recommendations from Dr. Ethyl, you can use Nasacort  2 sprays in each nostril daily, and continue the saline nasal irrigations, and use of humidifier next to you when you sleep may also be helpful for the dry nasal passages.  Follow-up with me in 6 months, happy to see you sooner if any new or worsening symptoms.  Hang in there.

## 2023-10-01 NOTE — Progress Notes (Unsigned)
 Subjective:  Patient ID: Teresa Castaneda, female    DOB: 1970-09-16  Age: 53 y.o. MRN: [id number]  CC:  Chief Complaint  Patient presents with   Diabetes    No questions or concerns.     HPI Teresa Castaneda presents for   Diabetes: With diabetic neuropathy followed by endocrinology, significantly improved A1c.  A1c looked great in April at 5.4.  Improved from previous readings.  She is taking gabapentin  300 mg once in the morning, 2 at lunch, 4 at night for her peripheral neuropathy.  Did have some breakthrough burning when discussed in April.  Had also been followed by pain management treated with tramadol   (2 pills up to 4 times per day - usually only needs 2-3 times per day), on duloxetine  120mg  every day. Did not tolerate Qutenza , made feel worse.  We did discuss the option of an additional gabapentin  in the morning if needed for breakthrough neuropathy symptoms at her April visit.  2 gabapentin  in the morning has been helpful, still on 2 in afternoon and 4 at night - no new side effects.  Controlled substance database reviewed, #240 gabapentin  filled on 08/24/2023, previously 07/13/2023, 05/30/2023, 04/26/2023.  Tramadol  noted from August 8, June 30, May 28 Appointment with endocrinology October 14. Metformin  XR 750 mg daily, Mounjaro  5 mg weekly, on statin with Lipitor 10 mg daily. No longer taking farxiga .  Home readings 90-100.  Microalbumin: Ordered in April, not performed.  Not able to provide specimen today. Plans at endocrine visit.  Optho, foot exam, pneumovax: due for optho eval. Declines referral - she will schedule on her own.   Lab Results  Component Value Date   HGBA1C 5.4 04/30/2023   HGBA1C 5.7 (A) 10/24/2022   HGBA1C 6.6 (A) 04/24/2022   Lab Results  Component Value Date   LDLCALC 77 04/30/2023   CREATININE 0.69 04/30/2023   Hyperlipidemia: Lipitor 10 mg daily without any myalgias or side effects. Lab Results  Component Value Date   CHOL 139 04/30/2023    HDL 44 (L) 04/30/2023   LDLCALC 77 04/30/2023   TRIG 94 04/30/2023   CHOLHDL 3.2 04/30/2023   Lab Results  Component Value Date   ALT 16 05/12/2022   AST 23 05/12/2022   ALKPHOS 96 05/12/2022   BILITOT 0.4 05/12/2022    Allergic rhinitis, asthma Treated with Nasacort , hydroxyzine  at bedtime previously to help with situational stressors but possible secondary benefit for her allergy symptoms.  Had also been taking Xyzal , we have discussed additive side effects with hydroxyzine .  Saline nasal sprays along with Nasacort  discussed previously, Trelegy daily for asthma.  Treated with doxycycline  for possible secondary sinusitis at her April visit with option to see ENT if any persistent symptoms. Congestion returned after antibiotic. Bloody nasal d/c on R at times.  Had abx for sinus infection on June 20th.  Still using nasacort  -1 spray per nostril daily (2 sprays recommended by Dr. Ethyl). Saline irrigations BID.  FESS, bilateral turbinate reductions in 2022, Dr. Ethyl.  Albuterol  infrequent, some weeks none at all, some weeks daily - depends on the weather. Using trelegy daily. Has seen pulmonary prior.   Due for follow up with GI. Will refill lasix  temporarily as she plans to call.   History Patient Active Problem List   Diagnosis Date Noted   Recurrent sinusitis 05/12/2022   Type 2 diabetes mellitus with diabetic neuropathy, without long-term current use of insulin  (HCC) 05/12/2022   Cirrhosis of liver (HCC) 06/23/2020  Symptomatic cholelithiasis 05/10/2020   Acquired hypothyroidism 04/05/2020   Weight gain 04/05/2020   Type 2 diabetes mellitus with hyperglycemia, without long-term current use of insulin  (HCC) 04/05/2020   History of COVID-19 09/23/2019   Pneumonia due to COVID-19 virus 09/23/2019   Cough 09/23/2019   BMI 40.0-44.9, adult (HCC) 07/22/2019   Facet arthropathy, lumbar 07/22/2019   Sacroiliitis (HCC) 07/22/2019   Asthma 09/30/2016   ANXIETY 09/10/2006    DEPRESSION 09/10/2006   ASTHMA 09/10/2006   HEADACHE 09/10/2006   Past Medical History:  Diagnosis Date   Allergies    Anxiety    Arthritis    Asthma    Back pain    Spinal Issue    Diabetes mellitus without complication (HCC)    DJD (degenerative joint disease), lumbar    Elevated cholesterol    Gallstone    GERD (gastroesophageal reflux disease)    Liver cirrhosis secondary to NASH (HCC)    Neuromuscular disorder (HCC)    Obesity    Oxygen deficiency    Thyroid  disease    Past Surgical History:  Procedure Laterality Date   ABDOMINAL HYSTERECTOMY  02/21/2003   CHOLECYSTECTOMY N/A 05/10/2020   Procedure: LAPAROSCOPIC CHOLECYSTECTOMY WITH INTRAOPERATIVE CHOLANGIOGRAM AND LIVER BIOPSY;  Surgeon: Lyndel Deward PARAS, MD;  Location: [facility];  Service: General;  Laterality: N/A;   DG SACROILIAC JOINTS ([facility] HX)  01/11/2021   [facility]   ENDOSCOPIC TURBINATE REDUCTION Bilateral 10/13/2020   Procedure: ENDOSCOPIC BILATERAL INFERIOR TURBINATE REDUCTIONS;  Surgeon: Ethyl Lonni BRAVO, MD;  Location: [facility];  Service: ENT;  Laterality: Bilateral;   ETHMOIDECTOMY Bilateral 10/13/2020   Procedure: BILATERAL TOTAL ETHMOIDECTOMY AND MAXILLARY OSTIA ENLARGEMENTS;  Surgeon: Ethyl Lonni BRAVO, MD;  Location: [facility];  Service: ENT;  Laterality: Bilateral;   LIVER BIOPSY     NASAL SINUS SURGERY Bilateral 10/13/2020   Procedure: SINUS ENDOSCOPY WITH STEALTH NAVIGATION;  Surgeon: Ethyl Lonni BRAVO, MD;  Location: [facility];  Service: ENT;  Laterality: Bilateral;   TONSILLECTOMY     TOTAL ABDOMINAL HYSTERECTOMY     Allergies  Allergen Reactions   Clarithromycin Other (See Comments)    Body cramps    Codeine Nausea And Vomiting   Tylenol  [Acetaminophen ] Other (See Comments)    Can not take Tylenol  products due to liver   Penicillins Rash   Prior to Admission medications   Medication Sig Start Date End Date Taking?  Authorizing Provider  albuterol  (VENTOLIN  HFA) 108 (90 Base) MCG/ACT inhaler Inhale 2 puffs into the lungs every 6 (six) hours as needed for wheezing or shortness of breath. 02/02/23  Yes Kara Dorn NOVAK, MD  amitriptyline  (ELAVIL ) 10 MG tablet TAKE 1 TABLET BY MOUTH AT BEDTIME 12/26/22  Yes Raulkar, Sven SQUIBB, MD  atorvastatin  (LIPITOR) 10 MG tablet Take 1 tablet (10 mg total) by mouth daily. 05/01/23  Yes Shamleffer, Ibtehal Jaralla, MD  DULoxetine  (CYMBALTA ) 60 MG capsule Take 2 capsules by mouth once daily 01/11/23  Yes Tabori, Katherine E, MD  furosemide  (LASIX ) 20 MG tablet Take 1 tablet (20 mg total) by mouth 2 (two) times daily. Please call [phone] to schedule an office visit for more refills 08/13/23  Yes Charlanne Groom, MD  gabapentin  (NEURONTIN ) 300 MG capsule TAKE 1 TO 2 CAPSULES BY MOUTH IN THE MORNING AND 2 CAPSULES AT LUNCH AND 4 CAPSULES AT NIGHT 08/24/23  Yes Levora Reyes SAUNDERS, MD  ibuprofen  (ADVIL ) 600 MG tablet Take 1 tablet (600 mg total) by mouth  2 (two) times daily as needed. 04/15/21  Yes Draper, Evalene R, DO  levocetirizine (XYZAL ) 5 MG tablet Take 1 tablet (5 mg total) by mouth every evening. 05/02/21  Yes Levora Reyes SAUNDERS, MD  levothyroxine  (SYNTHROID ) 175 MCG tablet Take 1 tablet (175 mcg total) by mouth daily. 05/01/23  Yes Shamleffer, Ibtehal Jaralla, MD  metFORMIN  (GLUCOPHAGE -XR) 750 MG 24 hr tablet Take 1 tablet (750 mg total) by mouth daily with breakfast. 04/30/23  Yes Shamleffer, Ibtehal Jaralla, MD  omeprazole  (PRILOSEC) 40 MG capsule Take 1 capsule (40 mg total) by mouth daily. 11/04/21  Yes Levora Reyes SAUNDERS, MD  rifaximin  (XIFAXAN ) 550 MG TABS tablet Take 1 tablet (550 mg total) by mouth 2 (two) times daily. Patient taking differently: Take 550 mg by mouth every other day. 11/25/21  Yes Charlanne Groom, MD  tirzepatide  (MOUNJARO ) 5 MG/0.5ML Pen Inject 5 mg into the skin once a week. 04/30/23  Yes Shamleffer, Ibtehal Jaralla, MD  topiramate  (TOPAMAX ) 25 MG tablet Take  1 tablet (25 mg total) by mouth daily. 08/18/22  Yes Raulkar, Sven SQUIBB, MD  traMADol  (ULTRAM ) 50 MG tablet Take 2 tablets (100 mg total) by mouth every 6 (six) hours as needed. 03/13/23 03/12/24 Yes Raulkar, Sven SQUIBB, MD  TRELEGY ELLIPTA  100-62.5-25 MCG/ACT AEPB INHALE 1 PUFF IN TO THE LUNGS DAILY . APPOINTMENT REQUIRED FOR FUTURE REFILLS 10/30/22  Yes Kara Dorn NOVAK, MD  triamcinolone  (NASACORT ) 55 MCG/ACT AERO nasal inhaler Place 2 sprays into the nose daily. 11/04/21  Yes Levora Reyes SAUNDERS, MD  Vitamin D, Ergocalciferol, (DRISDOL) 1.25 MG (50000 UNIT) CAPS capsule Take 50,000 Units by mouth once a week. 10/01/22  Yes [provider]  dapagliflozin  propanediol (FARXIGA ) 10 MG TABS tablet Take 1 tablet (10 mg total) by mouth daily. Patient not taking: Reported on 10/01/2023 04/30/23   Shamleffer, Ibtehal Jaralla, MD  doxycycline  (VIBRA -TABS) 100 MG tablet Take 1 tablet (100 mg total) by mouth 2 (two) times daily. Patient not taking: Reported on 10/01/2023 07/06/23   Mahlon Comer BRAVO, MD  lidocaine  (LIDODERM ) 5 % USE 1 PATCH EXTERNALLY ONCE DAILY REMOVE  AND  DISCARD  PATCH  WITH  12  HOURS  OR  AS  DIRECTED  BY  MD Patient not taking: Reported on 10/01/2023 08/11/20   Levora Reyes SAUNDERS, MD   Social History   Socioeconomic History   Marital status: Married    Spouse name: Not on file   Number of children: Not on file   Years of education: Not on file   Highest education level: GED or equivalent  Occupational History   Occupation: Development worker, international aid  Tobacco Use   Smoking status: Some Days    Current packs/day: 0.00    Average packs/day: 0.5 packs/day for 34.7 years (17.4 ttl pk-yrs)    Types: Cigarettes    Start date: 01/16/1985    Last attempt to quit: 10/08/2019    Years since quitting: 3.9   Smokeless tobacco: Never   Tobacco comments:    7- 8 cig a week  Vaping Use   Vaping status: Never Used  Substance and Sexual Activity   Alcohol use: No   Drug use: No   Sexual activity:  Yes    Birth control/protection: Surgical  Other Topics Concern   Not on file  Social History Narrative   Right Handed    Caffeine rare   Live with husband and mother in law in a two story hpme   Social Drivers of Health  Financial Resource Strain: Low Risk  (07/06/2023)   Overall Financial Resource Strain (CARDIA)    Difficulty of Paying Living Expenses: Not hard at all  Food Insecurity: No Food Insecurity (07/06/2023)   Hunger Vital Sign    Worried About Running Out of Food in the Last Year: Never true    Ran Out of Food in the Last Year: Never true  Transportation Needs: No Transportation Needs (07/06/2023)   PRAPARE - Administrator, Civil Service (Medical): No    Lack of Transportation (Non-Medical): No  Physical Activity: Insufficiently Active (07/06/2023)   Exercise Vital Sign    Days of Exercise per Week: 2 days    Minutes of Exercise per Session: 10 min  Stress: Stress Concern Present (07/06/2023)   [employer] of Occupational Health - Occupational Stress Questionnaire    Feeling of Stress: To some extent  Social Connections: Moderately Isolated (07/06/2023)   Social Connection and Isolation Panel    Frequency of Communication with Friends and Family: Once a week    Frequency of Social Gatherings with Friends and Family: Once a week    Attends Religious Services: 1 to 4 times per year    Active Member of [employer] or Organizations: No    Attends Engineer, structural: Not on file    Marital Status: Married  Catering manager Violence: Not on file    Review of Systems Per HPI.   Objective:   Vitals:   10/01/23 1111  BP: 120/86  Pulse: 95  Resp: 14  Temp: 98 F (36.7 C)  TempSrc: Temporal  SpO2: 97%  Weight: 203 lb 6.4 oz (92.3 kg)  Height: 5' 6 (1.676 m)     Physical Exam Vitals reviewed.  Constitutional:      General: She is not in acute distress.    Appearance: She is well-developed.  HENT:     Head: Normocephalic and  atraumatic.     Right Ear: Hearing, tympanic membrane, ear canal and external ear normal.     Left Ear: Hearing, tympanic membrane, ear canal and external ear normal.     Nose:     Comments: Narrowing of right nasal passage with small amount of congestion noted, no active discharge.  Dry nasal mucosa otherwise, more so on left.  No active bleeding or dried blood visualized.    Mouth/Throat:     Pharynx: No oropharyngeal exudate.  Eyes:     Conjunctiva/sclera: Conjunctivae normal.     Pupils: Pupils are equal, round, and reactive to light.  Cardiovascular:     Rate and Rhythm: Normal rate and regular rhythm.     Heart sounds: Normal heart sounds. No murmur heard. Pulmonary:     Effort: Pulmonary effort is normal. No respiratory distress.     Breath sounds: Normal breath sounds. No wheezing or rhonchi.  Skin:    General: Skin is warm and dry.     Findings: No rash.  Neurological:     Mental Status: She is alert and oriented to person, place, and time.  Psychiatric:        Behavior: Behavior normal.        Assessment & Plan:  Teresa Castaneda is a 53 y.o. female . Type 2 diabetes mellitus with diabetic neuropathy, without long-term current use of insulin  (HCC) Type 2 diabetes mellitus with hyperglycemia, without long-term current use of insulin  (HCC) - Plan: gabapentin  (NEURONTIN ) 300 MG capsule  - Well-controlled, continue follow-up with endocrinology.  Additional dose of  gabapentin  in the morning has been helpful, will continue same dosing of 2 pills in the morning, 2 pills in the afternoon, 4 pills at night.  Refills ordered.  Continue follow-up with pain management for adjustment of other meds as needed.  Moderate persistent asthma without complication - Plan: albuterol  (VENTOLIN  HFA) 108 (90 Base) MCG/ACT inhaler, Fluticasone -Umeclidin-Vilant (TRELEGY ELLIPTA ) 100-62.5-25 MCG/ACT AEPB  -Has seen pulmonary previously, but I do not mind refilling her Trelegy if stable symptoms.   RTC precautions discussed if increased need for albuterol  or worsening control, and may need to see pulmonary at that point.  Recurrent sinusitis  - Unfortunately I think she does have a component of chronic sinusitis with recurrent symptoms.  Has seen ENT previously, recommended that she seek care with ENT again as they may decide on chronic steroids, chronic antibiotics, or other treatment.  That really would be up to ENT given her previous surgery and symptoms.  She has refused ENT referral at this time.  Advised to let me know if she changes her mind.  I did give her the option to try humidifier at night to see if that helps with the dry nasal passages, continue saline irrigation, and can increase Nasacort  dosing as previously recommended by ENT.  RTC precautions.  Meds ordered this encounter  Medications   gabapentin  (NEURONTIN ) 300 MG capsule    Sig: TAKE 1 TO 2 CAPSULES BY MOUTH IN THE MORNING AND 2 CAPSULES AT LUNCH AND 4 CAPSULES AT NIGHT    Dispense:  240 capsule    Refill:  1   furosemide  (LASIX ) 20 MG tablet    Sig: Take 1 tablet (20 mg total) by mouth 2 (two) times daily. Please call [phone] to schedule an office visit for more refills    Dispense:  60 tablet    Refill:  0   albuterol  (VENTOLIN  HFA) 108 (90 Base) MCG/ACT inhaler    Sig: Inhale 2 puffs into the lungs every 6 (six) hours as needed for wheezing or shortness of breath.    Dispense:  8 g    Refill:  11   Fluticasone -Umeclidin-Vilant (TRELEGY ELLIPTA ) 100-62.5-25 MCG/ACT AEPB    Sig: Inhale 1 puff into the lungs daily.    Dispense:  60 each    Refill:  5   Patient Instructions  Thank you for coming in today. No change in medications at this time.  I did refill the furosemide  temporarily but call Dr. Charlanne for follow-up as they prescribe that medication previously.  I also refilled the Trelegy even though that was prescribed by pulmonary previously.  If you require albuterol  more frequently please follow-up with  myself or pulmonary to discuss change in plan for asthma.  As we discussed I am concerned about your recurrent sinus infections and sinus congestion and that likely is a component of chronic sinusitis.  That needs to be treated by ear nose and throat specialist, especially with your previous history of surgery.  Please let me know if you change your mind on that referral and I am happy to place one without an office visit.  Looking back at the recommendations from Dr. Ethyl, you can use Nasacort  2 sprays in each nostril daily, and continue the saline nasal irrigations, and use of humidifier next to you when you sleep may also be helpful for the dry nasal passages.  Follow-up with me in 6 months, happy to see you sooner if any new or worsening symptoms.  Hang in there.  Signed,   Reyes Pines, MD [facility], [facility] 10/01/23 12:02 PM

## 2023-10-02 ENCOUNTER — Encounter: Payer: Self-pay | Admitting: Family Medicine

## 2023-10-06 ENCOUNTER — Other Ambulatory Visit: Payer: Self-pay | Admitting: Physical Medicine and Rehabilitation

## 2023-10-10 ENCOUNTER — Telehealth: Payer: Self-pay

## 2023-10-10 NOTE — Telephone Encounter (Signed)
(  Key: [id number]) Rx #: [id number] Need Help? Call us  at [phone] Status sent iconSent to Plan today Drug traMADol  HCl 50MG  tablets ePA cloud logo Form [employer] Electronic PA Form [phone] NCPDP)

## 2023-10-23 NOTE — Telephone Encounter (Signed)
 Approved 10/12/23-04/13/24

## 2023-10-30 ENCOUNTER — Other Ambulatory Visit

## 2023-10-30 ENCOUNTER — Encounter: Payer: Self-pay | Admitting: Internal Medicine

## 2023-10-30 ENCOUNTER — Ambulatory Visit: Admitting: Internal Medicine

## 2023-10-30 VITALS — BP 110/68 | HR 81 | Ht 66.0 in | Wt 204.8 lb

## 2023-10-30 DIAGNOSIS — Z7984 Long term (current) use of oral hypoglycemic drugs: Secondary | ICD-10-CM | POA: Diagnosis not present

## 2023-10-30 DIAGNOSIS — E039 Hypothyroidism, unspecified: Secondary | ICD-10-CM

## 2023-10-30 DIAGNOSIS — Z7985 Long-term (current) use of injectable non-insulin antidiabetic drugs: Secondary | ICD-10-CM | POA: Diagnosis not present

## 2023-10-30 DIAGNOSIS — E1140 Type 2 diabetes mellitus with diabetic neuropathy, unspecified: Secondary | ICD-10-CM

## 2023-10-30 LAB — POCT GLYCOSYLATED HEMOGLOBIN (HGB A1C): Hemoglobin A1C: 5 % (ref 4.0–5.6)

## 2023-10-30 MED ORDER — TIRZEPATIDE 5 MG/0.5ML ~~LOC~~ SOAJ: 5.0000 mg | SUBCUTANEOUS | 3 refills | Status: AC

## 2023-10-30 NOTE — Patient Instructions (Signed)
-   Stop Metformin  750 mg, 1 tablet with Breakfast - Continue Farxiga  10 mg daily  - Continue  Mounjaro  5 mg weekly      HOW TO TREAT LOW BLOOD SUGARS (Blood sugar LESS THAN 70 MG/DL) Please follow the RULE OF 15 for the treatment of hypoglycemia treatment (when your (blood sugars are less than 70 mg/dL)   STEP 1: Take 15 grams of carbohydrates when your blood sugar is low, which includes:  3-4 GLUCOSE TABS  OR 3-4 OZ OF JUICE OR REGULAR SODA OR ONE TUBE OF GLUCOSE GEL    STEP 2: RECHECK blood sugar in 15 MINUTES STEP 3: If your blood sugar is still low at the 15 minute recheck --> then, go back to STEP 1 and treat AGAIN with another 15 grams of carbohydrates.

## 2023-10-30 NOTE — Progress Notes (Unsigned)
 Name: Teresa Castaneda  Age/ Sex: 53 y.o., female   MRN/ DOB: [id number], 08-07-1970     PCP: Levora Reyes SAUNDERS, MD   Reason for Endocrinology Evaluation: Type 2 Diabetes Mellitus  Initial Endocrine Consultative Visit: 04/05/2020    PATIENT IDENTIFIER: Teresa Castaneda is a 53 y.o. female with a past medical history of T2DM, cirrhosis  and Hypothyroidism. The patient has followed with Endocrinology clinic since 04/05/2020 for consultative assistance with management of her diabetes.     DIABETIC HISTORY:  [name] was diagnosed with DM in 08/2019, Metformin  started 08/2019. Her hemoglobin A1c has ranged from 8.1% in 2022, peaking at 9.8% in 2022. Has mild upset stomach since increasing metformin  but its transient    Glipizide  started 02/2021 and discontinued 10/2022 with an A1c of 5.7%  Mounjaro  started 10/2021  The patient was not taking Farxiga  in 2025, unknown reason, patient under the impression I told her to discontinue, no record of this Discontinued metformin  due to an A1c of 5.0% in October, 2025  THYROID  HISTORY: She has been diagnosed with hypothyroidism ~ 5 yrs ago No prior sx or radiation to the neck She was on Levothyroxine  in he past but that didn't  work   Pt is on Armour thyroid  180 mg daily and 15 mg daily , we stopped 50 mg of Armour Thyroid  which normalized her TSH.  Switched from armour to levothyroxine  04/2021 due to cost    SUBJECTIVE:   During the last visit (04/30/2023): A1c 5.4 %      Today (10/30/2023): [name]  is here for a follow up on diabetes and hypothyroid management. She checks her blood sugars 1 x daily  . 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   HOME ENDOCRINE  REGIMEN:  Metformin  750 mg 1 tab daily  Farxiga  10 mg daily -not taking Mounjaro  5 mg weekly Levothyroxine  175 mg daily       Statin: Yes ACE-I/ARB: No Prior Diabetic Education: No   METER DOWNLOAD SUMMARY:   88-123 mg/dL     DIABETIC COMPLICATIONS: Microvascular complications:  Neuropathy Denies: CKD, retinopathy Last Eye Exam: Completed 03/2020  Macrovascular complications:   Denies: CAD, CVA, PVD   HISTORY:  Past Medical History:  Past Medical History:  Diagnosis Date   Allergies    Anxiety    Arthritis    Asthma    Back pain    Spinal Issue    Diabetes mellitus without complication (HCC)    DJD (degenerative joint disease), lumbar    Elevated cholesterol    Gallstone    GERD (gastroesophageal reflux disease)    Liver cirrhosis secondary to NASH (HCC)    Neuromuscular disorder (HCC)    Obesity    Oxygen deficiency    Thyroid  disease    Past Surgical History:  Past Surgical History:  Procedure Laterality Date   ABDOMINAL HYSTERECTOMY  02/21/2003   CHOLECYSTECTOMY N/A 05/10/2020   Procedure: LAPAROSCOPIC CHOLECYSTECTOMY WITH INTRAOPERATIVE CHOLANGIOGRAM AND LIVER BIOPSY;  Surgeon: Lyndel Deward PARAS, MD;  Location: [facility];  Service: General;  Laterality: N/A;   DG SACROILIAC  JOINTS ([facility] HX)  01/11/2021   [facility]   ENDOSCOPIC TURBINATE REDUCTION Bilateral 10/13/2020   Procedure: ENDOSCOPIC BILATERAL INFERIOR TURBINATE REDUCTIONS;  Surgeon: Ethyl Lonni BRAVO, MD;  Location: [facility];  Service: ENT;  Laterality: Bilateral;   ETHMOIDECTOMY Bilateral 10/13/2020   Procedure: BILATERAL TOTAL ETHMOIDECTOMY AND MAXILLARY OSTIA ENLARGEMENTS;  Surgeon: Ethyl Lonni BRAVO, MD;  Location: [facility];  Service: ENT;  Laterality: Bilateral;   LIVER BIOPSY     NASAL SINUS SURGERY  Bilateral 10/13/2020   Procedure: SINUS ENDOSCOPY WITH STEALTH NAVIGATION;  Surgeon: Ethyl Lonni BRAVO, MD;  Location:  SURGERY CENTER;  Service: ENT;  Laterality: Bilateral;   TONSILLECTOMY     TOTAL ABDOMINAL HYSTERECTOMY     Social History:  reports that she has been smoking cigarettes. She started smoking about 38 years ago. She has a 17.4 pack-year smoking history. She has never used smokeless tobacco. She reports that she does not drink alcohol and does not use drugs. Family History:  Family History  Problem Relation Age of Onset   Coronary artery disease Mother    Diabetes Mother    COPD Mother    Coronary artery disease Father    Hypertension Father    Colon cancer Neg Hx    Esophageal cancer Neg Hx      HOME MEDICATIONS: Allergies as of 10/30/2023       Reactions   Clarithromycin Other (See Comments)   Body cramps    Codeine Nausea And Vomiting   Tylenol  [acetaminophen ] Other (See Comments)   Can not take Tylenol  products due to liver   Penicillins Rash        Medication List        Accurate as of October 30, 2023  6:56 AM. If you have any questions, ask your nurse or doctor.          albuterol  108 (90 Base) MCG/ACT inhaler Commonly known as: VENTOLIN  HFA Inhale 2 puffs into the lungs every 6 (six) hours as needed for wheezing or shortness of breath.   amitriptyline  10 MG tablet Commonly known as: ELAVIL  TAKE 1 TABLET BY MOUTH AT BEDTIME   atorvastatin  10 MG tablet Commonly known as: LIPITOR Take 1 tablet (10 mg total) by mouth daily.   DULoxetine  60 MG capsule Commonly known as: CYMBALTA  Take 2 capsules by mouth once daily   furosemide  20 MG tablet Commonly known as: LASIX  Take 1 tablet (20 mg total) by mouth 2 (two) times daily. Please call [phone] to schedule an office visit for more refills   gabapentin  300 MG capsule Commonly known as: NEURONTIN  TAKE 1 TO 2 CAPSULES BY MOUTH IN THE MORNING AND 2 CAPSULES AT LUNCH AND 4  CAPSULES AT NIGHT   ibuprofen  600 MG tablet Commonly known as: ADVIL  Take 1 tablet (600 mg total) by mouth 2 (two) times daily as needed.   levocetirizine 5 MG tablet Commonly known as: XYZAL  Take 1 tablet (5 mg total) by mouth every evening.   levothyroxine  175 MCG tablet Commonly known as: SYNTHROID  Take 1 tablet (175 mcg total) by mouth daily.   lidocaine  5 % Commonly known as: LIDODERM  USE 1 PATCH EXTERNALLY ONCE DAILY REMOVE  AND  DISCARD  PATCH  WITH  12  HOURS  OR  AS  DIRECTED  BY  MD   metFORMIN  750 MG 24 hr tablet Commonly known as: GLUCOPHAGE -XR Take 1 tablet (750 mg total) by mouth daily with breakfast.   omeprazole  40 MG  capsule Commonly known as: PRILOSEC Take 1 capsule (40 mg total) by mouth daily.   rifaximin  550 MG Tabs tablet Commonly known as: Xifaxan  Take 1 tablet (550 mg total) by mouth 2 (two) times daily. What changed: when to take this   tirzepatide  5 MG/0.5ML Pen Commonly known as: MOUNJARO  Inject 5 mg into the skin once a week.   topiramate  25 MG tablet Commonly known as: Topamax  Take 1 tablet (25 mg total) by mouth daily.   traMADol  50 MG tablet Commonly known as: ULTRAM  TAKE 2 TABLETS BY MOUTH EVERY 6 HOURS AS NEEDED   Trelegy Ellipta  100-62.5-25 MCG/ACT Aepb Generic drug: Fluticasone -Umeclidin-Vilant Inhale 1 puff into the lungs daily.   triamcinolone  55 MCG/ACT Aero nasal inhaler Commonly known as: NASACORT  Place 2 sprays into the nose daily.   Vitamin D (Ergocalciferol) 1.25 MG (50000 UNIT) Caps capsule Commonly known as: DRISDOL Take 50,000 Units by mouth once a week.         OBJECTIVE:   Vital Signs: There were no vitals taken for this visit.  Wt Readings from Last 3 Encounters:  10/01/23 203 lb 6.4 oz (92.3 kg)  07/06/23 200 lb (90.7 kg)  06/28/23 209 lb (94.8 kg)     Exam: General: Pt appears well and is in NAD  Neck: General: Supple without adenopathy. Thyroid :  No goiter or nodules appreciated.   Lungs:  Clear with good BS bilat with no rales, rhonchi, or wheezes  Heart: RRR   Extremities: Trace edema   Neuro: MS is good with appropriate affect, pt is alert and Ox3   DM Foot Exam 10/30/2023 The skin of the feet is intact without sores or ulcerations. The pedal pulses are 2+ on right and 2+ on left. The sensation is decreased  to a screening 5.07, 10 gram monofilament bilaterally   DATA REVIEWED:   Lab Results  Component Value Date   HGBA1C 5.4 04/30/2023   HGBA1C 5.7 (A) 10/24/2022   HGBA1C 6.6 (A) 04/24/2022    Latest Reference Range & Units 04/30/23 08:16  Sodium 135 - 146 mmol/L 140  Potassium 3.5 - 5.3 mmol/L 4.6  Chloride 98 - 110 mmol/L 107  CO2 20 - 32 mmol/L 26  Glucose 65 - 99 mg/dL 80  BUN 7 - 25 mg/dL 16  Creatinine 9.49 - 8.96 mg/dL 9.30  Calcium  8.6 - 10.4 mg/dL 9.2  BUN/Creatinine Ratio 6 - 22 (calc) SEE NOTE:  eGFR > OR = 60 mL/min/1.80m2 105  Total CHOL/HDL Ratio <5.0 (calc) 3.2  Cholesterol <200 mg/dL 860  HDL Cholesterol > OR = 50 mg/dL 44 (L)  LDL Cholesterol (Calc) mg/dL (calc) 77  Non-HDL Cholesterol (Calc) <130 mg/dL (calc) 95  Triglycerides <150 mg/dL 94    Latest Reference Range & Units 04/30/23 08:16  TSH mIU/L 3.67  T4,Free(Direct) 0.8 - 1.8 ng/dL 1.2  L   ASSESSMENT / PLAN / RECOMMENDATIONS:   1) Type 2 Diabetes Mellitus, Optimally  With Neuropathic complications - Most recent A1c of 5.0%. Goal A1c < 7.0 %.     - A1c continues to be optimal -Intolerant to higher doses of Metformin  - Discontinued metformin  750 mg due to an A1c of 5.0% in October, 2025 - The patient has not been on Farxiga  for approximately 6 months, she is under the impression that I told her to discontinue this, no record, we will remain off Farxiga  as long as her A1c is acceptable  MEDICATIONS: Stop metformin  750 mg, 1 tablet with  Continue  Mounjaro   mg weekly   EDUCATION / INSTRUCTIONS: BG monitoring instructions: Patient is instructed to check her blood sugars  1 times a day, fasting. Call [facility] if: BG persistently < 70  I reviewed the Rule of 15 for the treatment of hypoglycemia in detail with the patient. Literature supplied.    2) Diabetic complications:  Eye: Does not have known diabetic retinopathy.  Neuro/ Feet: Does not have known diabetic peripheral neuropathy .  Renal: Patient does not have known baseline CKD. She   is not on an ACEI/ARB at present.    3) Hypothyroidism:   - Pt is clinically euthyroid  - TSH ***  Medication  Continue levothyroxine  175 mcg daily  4) Dyslipidemia:   - LDL and TG at goal   Medication Continue atorvastatin  10 mg daily      F/U in 6 months   Signed electronically by: Stefano Redgie Butts, MD  [facility]  [facility] [address] [place] [place], [place] [address] Phone: [phone] FAX: [phone]   CC: Levora Reyes SAUNDERS, MD [id number] [place] [address] Phone: [phone]  Fax: [phone]  Return to Endocrinology clinic as below: Future Appointments  Date Time Provider Department Center  10/30/2023  7:30 AM Jolissa Kapral, Donell Redgie, MD [facility] None  12/04/2023  8:30 AM Charlanne Groom, MD [facility]  12/28/2023  8:20 AM Debby Fidela CROME, NP [facility] CPR  03/24/2024  8:20 AM Levora Reyes SAUNDERS, MD [facility] [place]

## 2023-10-31 ENCOUNTER — Ambulatory Visit: Payer: Self-pay | Admitting: Internal Medicine

## 2023-10-31 LAB — TSH: TSH: 16.5 m[IU]/L — ABNORMAL HIGH

## 2023-10-31 LAB — MICROALBUMIN / CREATININE URINE RATIO
Creatinine, Urine: 259 mg/dL (ref 20–275)
Microalb Creat Ratio: 20 mg/g{creat} (ref <30)
Microalb, Ur: 5.3 mg/dL

## 2023-10-31 MED ORDER — LEVOTHYROXINE SODIUM 175 MCG PO TABS: 175.0000 ug | ORAL_TABLET | Freq: Every day | ORAL | 3 refills | Status: AC

## 2023-11-19 ENCOUNTER — Other Ambulatory Visit: Payer: Self-pay | Admitting: Physical Medicine and Rehabilitation

## 2023-12-04 ENCOUNTER — Ambulatory Visit: Admitting: Gastroenterology

## 2023-12-04 ENCOUNTER — Other Ambulatory Visit (INDEPENDENT_AMBULATORY_CARE_PROVIDER_SITE_OTHER)

## 2023-12-04 ENCOUNTER — Encounter: Payer: Self-pay | Admitting: Gastroenterology

## 2023-12-04 VITALS — BP 108/70 | HR 77 | Ht 66.0 in | Wt 214.1 lb

## 2023-12-04 DIAGNOSIS — Z8719 Personal history of other diseases of the digestive system: Secondary | ICD-10-CM | POA: Diagnosis not present

## 2023-12-04 DIAGNOSIS — K7682 Hepatic encephalopathy: Secondary | ICD-10-CM | POA: Diagnosis not present

## 2023-12-04 DIAGNOSIS — K219 Gastro-esophageal reflux disease without esophagitis: Secondary | ICD-10-CM

## 2023-12-04 DIAGNOSIS — K7581 Nonalcoholic steatohepatitis (NASH): Secondary | ICD-10-CM | POA: Diagnosis not present

## 2023-12-04 DIAGNOSIS — K7469 Other cirrhosis of liver: Secondary | ICD-10-CM | POA: Diagnosis not present

## 2023-12-04 LAB — COMPREHENSIVE METABOLIC PANEL WITH GFR
ALT: 10 U/L (ref 0–35)
AST: 16 U/L (ref 0–37)
Albumin: 4 g/dL (ref 3.5–5.2)
Alkaline Phosphatase: 72 U/L (ref 39–117)
BUN: 19 mg/dL (ref 6–23)
CO2: 27 meq/L (ref 19–32)
Calcium: 9 mg/dL (ref 8.4–10.5)
Chloride: 103 meq/L (ref 96–112)
Creatinine, Ser: 0.69 mg/dL (ref 0.40–1.20)
GFR: 100.07 mL/min (ref >60.00)
Glucose, Bld: 77 mg/dL (ref 70–99)
Potassium: 4.1 meq/L (ref 3.5–5.1)
Sodium: 138 meq/L (ref 135–145)
Total Bilirubin: 0.3 mg/dL (ref 0.2–1.2)
Total Protein: 7.7 g/dL (ref 6.0–8.3)

## 2023-12-04 LAB — CBC WITH DIFFERENTIAL/PLATELET
Basophils Absolute: 0.1 K/uL (ref 0.0–0.1)
Basophils Relative: 0.9 % (ref 0.0–3.0)
Eosinophils Absolute: 0.6 K/uL (ref 0.0–0.7)
Eosinophils Relative: 7.4 % — ABNORMAL HIGH (ref 0.0–5.0)
HCT: 42.7 % (ref 36.0–46.0)
Hemoglobin: 14.3 g/dL (ref 12.0–15.0)
Lymphocytes Relative: 42.7 % (ref 12.0–46.0)
Lymphs Abs: 3.3 K/uL (ref 0.7–4.0)
MCHC: 33.6 g/dL (ref 30.0–36.0)
MCV: 86.6 fl (ref 78.0–100.0)
Monocytes Absolute: 0.5 K/uL (ref 0.1–1.0)
Monocytes Relative: 6 % (ref 3.0–12.0)
Neutro Abs: 3.4 K/uL (ref 1.4–7.7)
Neutrophils Relative %: 43 % (ref 43.0–77.0)
Platelets: 157 K/uL (ref 150.0–400.0)
RBC: 4.93 Mil/uL (ref 3.87–5.11)
RDW: 14.4 % (ref 11.5–15.5)
WBC: 7.8 K/uL (ref 4.0–10.5)

## 2023-12-04 LAB — PROTIME-INR
INR: 1.1 ratio — ABNORMAL HIGH (ref 0.8–1.0)
Prothrombin Time: 11.5 s (ref 9.6–13.1)

## 2023-12-04 MED ORDER — OMEPRAZOLE 40 MG PO CPDR: 40.0000 mg | DELAYED_RELEASE_CAPSULE | Freq: Every day | ORAL | 3 refills | Status: DC

## 2023-12-04 MED ORDER — FUROSEMIDE 20 MG PO TABS: 20.0000 mg | ORAL_TABLET | Freq: Two times a day (BID) | ORAL | 3 refills | Status: AC

## 2023-12-04 NOTE — Progress Notes (Signed)
 Chief Complaint: FU  Referring Provider:  Levora Reyes SAUNDERS, MD      ASSESSMENT AND PLAN;   #1. GERD   #2. 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 No EV on EGD 07/2020. No ascites.  -Neg extensive work-up. No ETOH.   -AFP was borderline elevated.  Neg CT Abdo/pelvis for any liver masses 05/2020.  Neg US  04/2021 -Has subclinical hepatic encephalopathy.  Could not tolerate lactulose . On rifaximin  550 twice daily.  #3. H/O biliary colic s/p lap chole with neg IOC and liver Bx 05/10/2020.  Plan: -Low salt, normal protein diet -Continue Rifaxamin 550mg  po BID. -Continue omeprazole  40mg  po QD #90 -Continue lasix  20mg  po BID -US  abdo -EGD for EV screening -Continue wt loss. -Best possible control for DM. -Check labs (CBC, CMP, INR, AFP)     HPI:    Teresa Castaneda is a 53 y.o. female  With DM on mounjaro , obesity, DJD, HLD, anxiety, L foot #, sinus Sx, hypothyroidism FU MASH cirrhosis with portal hypertension Here for medication refill-Lasix  and rifaximin  Doing well. History of Present Illness  She experiences persistent swelling and pain in her feet, describing her toes as resembling 'sausages.' The swelling is localized from the mid-foot downwards. She is currently taking Lasix  20 mg twice a day for fluid management. No excessive salt intake is reported.  Her diabetes is well-controlled, with an A1c of 5. She has been taken off all diabetes medications except for Mounjaro , which has contributed to significant weight loss.  No nausea, vomiting, heartburn, regurgitation, odynophagia or dysphagia.  No significant diarrhea or constipation.  No melena or hematochezia. No unintentional weight loss. No abdominal pain.  No change in mental status.  Does feel fatigued.  She experiences significant anxiety, exacerbated by the discontinuation of Ativan  by her primary care physician. She reports difficulty accessing  psychiatric care due to cost and availability.  She has a history of thyroid  issues, with consistently high TSH levels. Her thyroid  medication is taken regularly.  She has a history of sinus issues and surgeries, and occasionally smokes, particularly when stressed. She works third shift, which impacts her sleep quality.   Has been on Mounjaro   1/week- lost significant weight   Wt Readings from Last 3 Encounters:  12/04/23 214 lb 2 oz (97.1 kg)  10/30/23 204 lb 12.8 oz (92.9 kg)  10/01/23 203 lb 6.4 oz (92.3 kg)        From previous notes:  Had Biliary colic (RUQ pain) , underwent lap chole with neg IOC and liver Bx 05/10/2020. Liver Bx reviewed at [facility] showed steatohepatitis with stage IV cirrhosis.  She does have FH of NASH cirrhosis (mom).  Unfortunately, her mom has passed away due to complications from diabetes.  Could not tolerate lactulose  due to diarrhea.  Denies having any significant change in her mental status.  She continues to be on rifaximin .  No skin lesions, easy bruisability, intake of OTC meds including diet pills, herbal medications, anabolic steroids or Tylenol . There is no H/O blood transfusions, IVDA. No jaundice, dark urine or pale stools. No alcohol abuse.    Past GI procedures: US  12/2022: Stable cirrhosis.  No focal lesions.  Splenomegaly.  No ascites. US  04/2011 1. Cirrhotic morphology of the liver.  No focal mass identified. 2. The splenic length was measured at 11.7 cm today versus greater than 13 cm previously. I suspect the splenic length may not have been accurately measured today and visually, I am still  concerned for splenomegaly although a volume was not obtained. 3. The common bile duct measures 6.7 mm which is mildly dilated, within normal limits given previous cholecystectomy.  Colon 07/2020 - One 6 mm polyp in the proximal ascending colon, removed with a cold snare. Resected and retrieved. Bx- TA - Four 6 to 8 mm polyps in the mid  rectum and in the distal sigmoid colon, removed with a cold snare. Resected and retrieved. Bx-hyperplastic. - Non-bleeding internal hemorrhoids. - The examined portion of the ileum was normal. - The examination was otherwise normal on direct and retroflexion views. - Negative random colonic biopsies. - Next due 07/2027  EGD 07/2020 - Mild gastritis. - Salmon-colored mucosa projecting 1 cm above GE junction. Bx- neg for Barrett's. - No esophageal varices. - Negative small bowel biopsies for celiac disease.  CT Abdo/pelvis with contrast 06/09/2020 1. Morphologic features of the liver compatible with cirrhosis. No suspicious liver lesions identified. 2. Splenomegaly. 3. Aortic atherosclerosis.  US  10/2020 1. Cirrhotic morphology of the liver. No focal liver lesion is identified. 2. Sequela of portal hypertension including splenomegaly. 3. Prior cholecystectomy. 4. No Ascites  Additional liver work-up -AFP 6.5 10/15/2020, 4.5 08/30/2021 -ANA 1:40 -Neg AMA, celiac serology, iron studies, alpha-1 antitrypsin, ASMA -Ammonia 55 -Not immune to hepatitis A/B, s/p vaccines - No EtOH. - Does occasionally smoke.    Additional GI history: Op note from lap chole 05/10/2020-reviewed Lenoard Foy, MD General, Bariatric, & Minimally Invasive Surgery [facility] [place]) -Cirrhotic appearing liver. Bx-Liver Bx reviewed at [facility] showed steatohepatitis with stage IV cirrhosis. -IOC negative -No ascites or any obvious varices  Past Medical History:  Diagnosis Date   Allergies    Anxiety    Arthritis    Asthma    Back pain    Spinal Issue    Diabetes mellitus without complication (HCC)    DJD (degenerative joint disease), lumbar    Elevated cholesterol    Gallstone    GERD (gastroesophageal reflux disease)    Liver cirrhosis secondary to NASH (HCC)    Neuromuscular disorder (HCC)    Obesity    Oxygen deficiency    Thyroid  disease     Past Surgical History:   Procedure Laterality Date   ABDOMINAL HYSTERECTOMY  02/21/2003   CHOLECYSTECTOMY N/A 05/10/2020   Procedure: LAPAROSCOPIC CHOLECYSTECTOMY WITH INTRAOPERATIVE CHOLANGIOGRAM AND LIVER BIOPSY;  Surgeon: Foy Deward PARAS, MD;  Location: [facility];  Service: General;  Laterality: N/A;   DG SACROILIAC JOINTS ([facility] HX)  01/11/2021   [facility]   ENDOSCOPIC TURBINATE REDUCTION Bilateral 10/13/2020   Procedure: ENDOSCOPIC BILATERAL INFERIOR TURBINATE REDUCTIONS;  Surgeon: Ethyl Lonni BRAVO, MD;  Location: [facility];  Service: ENT;  Laterality: Bilateral;   ETHMOIDECTOMY Bilateral 10/13/2020   Procedure: BILATERAL TOTAL ETHMOIDECTOMY AND MAXILLARY OSTIA ENLARGEMENTS;  Surgeon: Ethyl Lonni BRAVO, MD;  Location: [facility];  Service: ENT;  Laterality: Bilateral;   LIVER BIOPSY     NASAL SINUS SURGERY Bilateral 10/13/2020   Procedure: SINUS ENDOSCOPY WITH STEALTH NAVIGATION;  Surgeon: Ethyl Lonni BRAVO, MD;  Location: [facility];  Service: ENT;  Laterality: Bilateral;   TONSILLECTOMY     TOTAL ABDOMINAL HYSTERECTOMY      Family History  Problem Relation Age of Onset   Coronary artery disease Mother    Diabetes Mother    COPD Mother    Coronary artery disease Father    Hypertension Father    Colon cancer Neg Hx    Esophageal  cancer Neg Hx     Social History   Tobacco Use   Smoking status: Some Days    Current packs/day: 0.00    Average packs/day: 0.5 packs/day for 34.7 years (17.4 ttl pk-yrs)    Types: Cigarettes    Start date: 01/16/1985    Last attempt to quit: 10/08/2019    Years since quitting: 4.1   Smokeless tobacco: Never   Tobacco comments:    7- 8 cig a week  Vaping Use   Vaping status: Never Used  Substance Use Topics   Alcohol use: No   Drug use: No    Current Outpatient Medications  Medication Sig Dispense Refill   albuterol  (VENTOLIN  HFA) 108 (90 Base) MCG/ACT inhaler Inhale 2 puffs into the lungs  every 6 (six) hours as needed for wheezing or shortness of breath. 8 g 11   amitriptyline  (ELAVIL ) 10 MG tablet TAKE 1 TABLET BY MOUTH AT BEDTIME 30 tablet 0   atorvastatin  (LIPITOR) 10 MG tablet Take 1 tablet (10 mg total) by mouth daily. 90 tablet 3   DULoxetine  (CYMBALTA ) 60 MG capsule Take 2 capsules by mouth once daily 90 capsule 0   Fluticasone -Umeclidin-Vilant (TRELEGY ELLIPTA ) 100-62.5-25 MCG/ACT AEPB Inhale 1 puff into the lungs daily. 60 each 5   furosemide  (LASIX ) 20 MG tablet Take 1 tablet (20 mg total) by mouth 2 (two) times daily. Please call [phone] to schedule an office visit for more refills 60 tablet 0   gabapentin  (NEURONTIN ) 300 MG capsule TAKE 1 TO 2 CAPSULES BY MOUTH IN THE MORNING AND 2 CAPSULES AT LUNCH AND 4 CAPSULES AT NIGHT 240 capsule 1   levocetirizine (XYZAL ) 5 MG tablet Take 1 tablet (5 mg total) by mouth every evening. 90 tablet 3   levothyroxine  (SYNTHROID ) 175 MCG tablet Take 1 tablet (175 mcg total) by mouth daily. 90 tablet 3   omeprazole  (PRILOSEC) 40 MG capsule Take 1 capsule (40 mg total) by mouth daily. 90 capsule 3   rifaximin  (XIFAXAN ) 550 MG TABS tablet Take 1 tablet (550 mg total) by mouth 2 (two) times daily. (Patient taking differently: Take 550 mg by mouth every other day.) 60 tablet 12   tirzepatide  (MOUNJARO ) 5 MG/0.5ML Pen Inject 5 mg into the skin once a week. 6 mL 3   topiramate  (TOPAMAX ) 25 MG tablet Take 1 tablet (25 mg total) by mouth daily. 90 tablet 3   traMADol  (ULTRAM ) 50 MG tablet TAKE 2 TABLETS BY MOUTH EVERY 6 HOURS AS NEEDED 240 tablet 0   triamcinolone  (NASACORT ) 55 MCG/ACT AERO nasal inhaler Place 2 sprays into the nose daily. 1 each 12   Vitamin D, Ergocalciferol, (DRISDOL) 1.25 MG (50000 UNIT) CAPS capsule Take 50,000 Units by mouth once a week.     No current facility-administered medications for this visit.    Allergies  Allergen Reactions   Clarithromycin Other (See Comments)    Body cramps    Codeine Nausea And  Vomiting   Tylenol  [Acetaminophen ] Other (See Comments)    Can not take Tylenol  products due to liver   Penicillins Rash    Review of Systems:  Constitutional: Denies fever, chills, diaphoresis, appetite change and has fatigue.  Has anxiety.     Physical Exam:    BP 108/70   Pulse 77   Ht 5' 6 (1.676 m)   Wt 214 lb 2 oz (97.1 kg)   BMI 34.56 kg/m  Wt Readings from Last 3 Encounters:  12/04/23 214 lb 2 oz (97.1 kg)  10/30/23 204 lb 12.8 oz (92.9 kg)  10/01/23 203 lb 6.4 oz (92.3 kg)   Constitutional:  Well-developed, in no acute distress. Psychiatric: Normal mood and affect. Behavior is normal. HEENT: Pupils normal.  Conjunctivae are normal. No scleral icterus.  Cardiovascular: Normal rate, regular rhythm. No edema Pulmonary/chest: Effort normal and breath sounds normal. No wheezing, rales or rhonchi. Abdominal: Soft, nondistended. Nontender. Bowel sounds active throughout. There are no masses palpable.  Liver palpated 2 cm below costal margin.  No ascites. Rectal: Deferred Neurological: Alert and oriented to person place and time. Skin: Skin is warm and dry. No rashes noted. Extremities-no edema.   Data Reviewed: I have personally reviewed following labs and imaging studies  CBC:    Latest Ref Rng & Units 08/30/2021   10:06 AM 05/10/2021    8:45 AM 02/11/2021    9:36 AM  CBC  WBC 4.0 - 10.5 K/uL 5.3  8.7  11.4   Hemoglobin 12.0 - 15.0 g/dL 89.5  88.0  87.5   Hematocrit 36.0 - 46.0 % 33.4  37.0  40.0   Platelets 150.0 - 400.0 K/uL 110.0  150.0      CMP:    Latest Ref Rng & Units 04/30/2023    8:16 AM 05/12/2022    8:37 AM 09/13/2021   11:07 AM  CMP  Glucose 65 - 99 mg/dL 80  884    BUN 7 - 25 mg/dL 16  13    Creatinine 9.49 - 1.03 mg/dL 9.30  9.36    Sodium 864 - 146 mmol/L 140  137    Potassium 3.5 - 5.3 mmol/L 4.6  4.2    Chloride 98 - 110 mmol/L 107  104    CO2 20 - 32 mmol/L 26  25    Calcium  8.6 - 10.4 mg/dL 9.2  8.8    Total Protein 6.0 - 8.3 g/dL   7.4  7.6   Total Bilirubin 0.2 - 1.2 mg/dL  0.4    Alkaline Phos 39 - 117 U/L  96    AST 0 - 37 U/L  23    ALT 0 - 35 U/L  16        Latest Ref Rng & Units 05/12/2022    8:37 AM 09/13/2021   11:07 AM 08/30/2021   10:06 AM  Hepatic Function  Total Protein 6.0 - 8.3 g/dL 7.4  7.6  7.3   Albumin 3.5 - 5.2 g/dL 3.6   3.5   AST 0 - 37 U/L 23   39   ALT 0 - 35 U/L 16   29   Alk Phosphatase 39 - 117 U/L 96   81   Total Bilirubin 0.2 - 1.2 mg/dL 0.4   0.7      Radiology Studies: No results found.     Anselm Bring, MD 12/04/2023, 9:02 AM  Cc: Levora Reyes SAUNDERS, MD

## 2023-12-04 NOTE — Patient Instructions (Signed)
 _______________________________________________________  If your blood pressure at your visit was 140/90 or greater, please contact your primary care physician to follow up on this.  _______________________________________________________  If you are age 52 or older, your body mass index should be between 23-30. Your Body mass index is 34.56 kg/m. If this is out of the aforementioned range listed, please consider follow up with your Primary Care Provider.  If you are age 18 or younger, your body mass index should be between 19-25. Your Body mass index is 34.56 kg/m. If this is out of the aformentioned range listed, please consider follow up with your Primary Care Provider.   ________________________________________________________  The [facility] providers would like to encourage you to use MYCHART to communicate with providers for non-urgent requests or questions.  Due to long hold times on the telephone, sending your provider a message by [facility] may be a faster and more efficient way to get a response.  Please allow 48 business hours for a response.  Please remember that this is for non-urgent requests.  _______________________________________________________  [facility] is using a team-based approach to care.  Your team is made up of your doctor and two to three APPS. Our APPS (Nurse Practitioners and Physician Assistants) work with your physician to ensure care continuity for you. They are fully qualified to address your health concerns and develop a treatment plan. They communicate directly with your gastroenterologist to care for you. Seeing the Advanced Practice Practitioners on your physician's team can help you by facilitating care more promptly, often allowing for earlier appointments, access to diagnostic testing, procedures, and other specialty referrals.   Your provider has requested that you go to the basement level for lab work before leaving today. Press B on the  elevator. The lab is located at the first door on the left as you exit the elevator.  You have been scheduled for an abdominal ultrasound at [facility] (1st floor of hospital) on 12-17-23 at 8am. Please arrive 30 minutes prior to your appointment for registration. Make certain not to have anything to eat or drink midnight prior to your appointment. Should you need to reschedule your appointment, please contact radiology at [phone]. This test typically takes about 30 minutes to perform.  You have been scheduled for an endoscopy. Please follow written instructions given to you at your visit today.  If you use inhalers (even only as needed), please bring them with you on the day of your procedure.  If you take any of the following medications, they will need to be adjusted prior to your procedure:   DO NOT TAKE 7 DAYS PRIOR TO TEST- Trulicity (dulaglutide) Ozempic, Wegovy (semaglutide) Mounjaro , Zepbound  (tirzepatide ) Bydureon Bcise (exanatide extended release)  DO NOT TAKE 1 DAY PRIOR TO YOUR TEST Rybelsus (semaglutide) Adlyxin (lixisenatide) Victoza (liraglutide) Byetta (exanatide) ___________________________________________________________________________  Low-Sodium Eating Plan Salt (sodium) helps you keep a healthy balance of fluids in your body. Too much sodium can raise your blood pressure. It can also cause fluid and waste to be held in your body. Your health care provider or dietitian may recommend a low-sodium eating plan if you have high blood pressure (hypertension), kidney disease, liver disease, or heart failure. Eating less sodium can help lower your blood pressure and reduce swelling. It can also protect your heart, liver, and kidneys. What are tips for following this plan? Reading food labels  Check food labels for the amount of sodium per serving. If you eat more than one serving, you must  multiply the listed amount by the number of servings. Choose foods  with less than 140 milligrams (mg) of sodium per serving. Avoid foods with 300 mg of sodium or more per serving. Always check how much sodium is in a product, even if the label says unsalted or no salt added. Shopping  Buy products labeled as low-sodium or no salt added. Buy fresh foods. Avoid canned foods and pre-made or frozen meals. Avoid canned, cured, or processed meats. Buy breads that have less than 80 mg of sodium per slice. Cooking  Eat more home-cooked food. Try to eat less restaurant, buffet, and fast food. Try not to add salt when you cook. Use salt-free seasonings or herbs instead of table salt or sea salt. Check with your provider or pharmacist before using salt substitutes. Cook with plant-based oils, such as canola, sunflower, or olive oil. Meal planning When eating at a restaurant, ask if your food can be made with less salt or no salt. Avoid dishes labeled as brined, pickled, cured, or smoked. Avoid dishes made with soy sauce, miso, or teriyaki sauce. Avoid foods that have monosodium glutamate (MSG) in them. MSG may be added to some restaurant food, sauces, soups, bouillon, and canned foods. Make meals that can be grilled, baked, poached, roasted, or steamed. These are often made with less sodium. General information Try to limit your sodium intake to 1,500-2,300 mg each day, or the amount told by your provider. What foods should I eat? Fruits Fresh, frozen, or canned fruit. Fruit juice. Vegetables Fresh or frozen vegetables. No salt added canned vegetables. No salt added tomato sauce and paste. Low-sodium or reduced-sodium tomato and vegetable juice. Grains Low-sodium cereals, such as oats, puffed wheat and rice, and shredded wheat. Low-sodium crackers. Unsalted rice. Unsalted pasta. Low-sodium bread. Whole grain breads and whole grain pasta. Meats and other proteins Fresh or frozen meat, poultry, seafood, and fish. These should have no added salt.  Low-sodium canned tuna and salmon. Unsalted nuts. Dried peas, beans, and lentils without added salt. Unsalted canned beans. Eggs. Unsalted nut butters. Dairy Milk. Soy milk. Cheese that is naturally low in sodium, such as ricotta cheese, fresh mozzarella, or Swiss cheese. Low-sodium or reduced-sodium cheese. Cream cheese. Yogurt. Seasonings and condiments Fresh and dried herbs and spices. Salt-free seasonings. Low-sodium mustard and ketchup. Sodium-free salad dressing. Sodium-free light mayonnaise. Fresh or refrigerated horseradish. Lemon juice. Vinegar. Other foods Homemade, reduced-sodium, or low-sodium soups. Unsalted popcorn and pretzels. Low-salt or salt-free chips. The items listed above may not be all the foods and drinks you can have. Talk to a dietitian to learn more. What foods should I avoid? Vegetables Sauerkraut, pickled vegetables, and relishes. Olives. French fries. Onion rings. Regular canned vegetables, except low-sodium or reduced-sodium items. Regular canned tomato sauce and paste. Regular tomato and vegetable juice. Frozen vegetables in sauces. Grains Instant hot cereals. Bread stuffing, pancake, and biscuit mixes. Croutons. Seasoned rice or pasta mixes. Noodle soup cups. Boxed or frozen macaroni and cheese. Regular salted crackers. Self-rising flour. Meats and other proteins Meat or fish that is salted, canned, smoked, spiced, or pickled. Precooked or cured meat, such as sausages or meat loaves. [place]. Ham. Pepperoni. Hot dogs. Corned beef. Chipped beef. Salt pork. Jerky. Pickled herring, anchovies, and sardines. Regular canned tuna. Salted nuts. Dairy Processed cheese and cheese spreads. Hard cheeses. Cheese curds. Blue cheese. Feta cheese. String cheese. Regular cottage cheese. Buttermilk. Canned milk. Fats and oils Salted butter. Regular margarine. Ghee. Bacon fat. Seasonings and condiments Onion salt, garlic  salt, seasoned salt, table salt, and sea salt. Canned and  packaged gravies. Worcestershire sauce. Tartar sauce. Barbecue sauce. Teriyaki sauce. Soy sauce, including reduced-sodium soy sauce. Steak sauce. Fish sauce. Oyster sauce. Cocktail sauce. Horseradish that you find on the shelf. Regular ketchup and mustard. Meat flavorings and tenderizers. Bouillon cubes. Hot sauce. Pre-made or packaged marinades. Pre-made or packaged taco seasonings. Relishes. Regular salad dressings. Salsa. Other foods Salted popcorn and pretzels. Corn chips and puffs. Potato and tortilla chips. Canned or dried soups. Pizza. Frozen entrees and pot pies. The items listed above may not be all the foods and drinks you should avoid. Talk to a dietitian to learn more. This information is not intended to replace advice given to you by your health care provider. Make sure you discuss any questions you have with your health care provider. Document Revised: 01/19/2022 Document Reviewed: 01/19/2022 Elsevier Patient Education  2024 [employer].

## 2023-12-06 LAB — AFP TUMOR MARKER: AFP-Tumor Marker: 3.5 ng/mL

## 2023-12-08 ENCOUNTER — Ambulatory Visit: Payer: Self-pay | Admitting: Gastroenterology

## 2023-12-08 ENCOUNTER — Other Ambulatory Visit: Payer: Self-pay | Admitting: Family Medicine

## 2023-12-08 DIAGNOSIS — E1165 Type 2 diabetes mellitus with hyperglycemia: Secondary | ICD-10-CM

## 2023-12-10 NOTE — Telephone Encounter (Signed)
 Requested Prescriptions   Pending Prescriptions Disp Refills   gabapentin  (NEURONTIN ) 300 MG capsule [Pharmacy Med Name: Gabapentin  300 MG Oral Capsule] 240 capsule 0    Sig: TAKE 1 TO 2 CAPSULES BY MOUTH IN THE MORNING AND 2 CAPSULES AT LUNCH AND 4 CAPSULES AT NIGHT     Date of patient request: 12/10/2023 Last office visit: 10/01/2023 Upcoming visit: 03/24/2024 Date of last refill: 10/01/2023 Last refill amount: 240

## 2023-12-10 NOTE — Telephone Encounter (Signed)
 Controlled substance database reviewed.  Gabapentin  No. 240 last filled on 11/03/2023, previously 10/01/2023, 08/24/2023.  Medication discussed at her September 15 visit.  8 pills/day, 30-day supply, #240.  Refill ordered.

## 2023-12-11 ENCOUNTER — Encounter: Payer: Self-pay | Admitting: Gastroenterology

## 2023-12-11 ENCOUNTER — Ambulatory Visit: Admitting: Gastroenterology

## 2023-12-11 VITALS — BP 95/56 | HR 86 | Temp 97.7°F | Resp 14 | Ht 66.0 in | Wt 214.0 lb

## 2023-12-11 DIAGNOSIS — K3189 Other diseases of stomach and duodenum: Secondary | ICD-10-CM

## 2023-12-11 DIAGNOSIS — K219 Gastro-esophageal reflux disease without esophagitis: Secondary | ICD-10-CM

## 2023-12-11 DIAGNOSIS — K2970 Gastritis, unspecified, without bleeding: Secondary | ICD-10-CM

## 2023-12-11 DIAGNOSIS — K259 Gastric ulcer, unspecified as acute or chronic, without hemorrhage or perforation: Secondary | ICD-10-CM | POA: Diagnosis present

## 2023-12-11 MED ORDER — PANTOPRAZOLE SODIUM 40 MG PO TBEC: 40.0000 mg | DELAYED_RELEASE_TABLET | Freq: Two times a day (BID) | ORAL | 11 refills | Status: AC

## 2023-12-11 MED ORDER — SODIUM CHLORIDE 0.9 % IV SOLN: 500.0000 mL | INTRAVENOUS | Status: DC

## 2023-12-11 NOTE — Patient Instructions (Addendum)
 Handout provided on gastritis.  Resume previous diet.  Continue present medications.  No aspirin, ibuprofen , naproxen, or other non-steriodal anti-inflammatory drugs (NSAIDs). You may use Tylenol  if needed for mild pain or fever.  Start Protonix  (pantoprazole ) 40 mg by mouth twice daily. Can stop the omeprazole .  Repeat upper endoscopy in 12 weeks to evaluate the response to therapy. My office will contact you to schedule this.    YOU HAD AN ENDOSCOPIC PROCEDURE TODAY AT THE [facility]:   Refer to the procedure report that was given to you for any specific questions about what was found during the examination.  If the procedure report does not answer your questions, please call your gastroenterologist to clarify.  If you requested that your care partner not be given the details of your procedure findings, then the procedure report has been included in a sealed envelope for you to review at your convenience later.  YOU SHOULD EXPECT: Some feelings of bloating in the abdomen. Passage of more gas than usual.  Walking can help get rid of the air that was put into your GI tract during the procedure and reduce the bloating. If you had a lower endoscopy (such as a colonoscopy or flexible sigmoidoscopy) you may notice spotting of blood in your stool or on the toilet paper. If you underwent a bowel prep for your procedure, you may not have a normal bowel movement for a few days.  Please Note:  You might notice some irritation and congestion in your nose or some drainage.  This is from the oxygen used during your procedure.  There is no need for concern and it should clear up in a day or so.  SYMPTOMS TO REPORT IMMEDIATELY:  Following upper endoscopy (EGD)  Vomiting of blood or coffee ground material  New chest pain or pain under the shoulder blades  Painful or persistently difficult swallowing  New shortness of breath  Fever of 100F or higher  Black, tarry-looking stools  For urgent or  emergent issues, a gastroenterologist can be reached at any hour by calling [phone]. Do not use MyChart messaging for urgent concerns.    DIET:  We do recommend a small meal at first, but then you may proceed to your regular diet.  Drink plenty of fluids but you should avoid alcoholic beverages for 24 hours.  ACTIVITY:  You should plan to take it easy for the rest of today and you should NOT DRIVE or use heavy machinery until tomorrow (because of the sedation medicines used during the test).    FOLLOW UP: Our staff will call the number listed on your records the next business day following your procedure.  We will call around 7:15- 8:00 am to check on you and address any questions or concerns that you may have regarding the information given to you following your procedure. If we do not reach you, we will leave a message.     If any biopsies were taken you will be contacted by phone or by letter within the next 1-3 weeks.  Please call us  at [phone] if you have not heard about the biopsies in 3 weeks.    SIGNATURES/CONFIDENTIALITY: You and/or your care partner have signed paperwork which will be entered into your electronic medical record.  These signatures attest to the fact that that the information above on your After Visit Summary has been reviewed and is understood.  Full responsibility of the confidentiality of this discharge information lies with you and/or  your care-partner.

## 2023-12-11 NOTE — Progress Notes (Signed)
 Chief Complaint: FU  Referring Provider:  Levora Reyes SAUNDERS, MD      ASSESSMENT AND PLAN;   #1. GERD   #2. 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 No EV on EGD 07/2020. No ascites.  -Neg extensive work-up. No ETOH.   -AFP was borderline elevated.  Neg CT Abdo/pelvis for any liver masses 05/2020.  Neg US  04/2021 -Has subclinical hepatic encephalopathy.  Could not tolerate lactulose . On rifaximin  550 twice daily.  #3. H/O biliary colic s/p lap chole with neg IOC and liver Bx 05/10/2020.  Plan: -Low salt, normal protein diet -Continue Rifaxamin 550mg  po BID. -Continue omeprazole  40mg  po QD #90 -Continue lasix  20mg  po BID -US  abdo -EGD for EV screening -Continue wt loss. -Best possible control for DM. -Check labs (CBC, CMP, INR, AFP)     HPI:    Teresa Castaneda is a 53 y.o. female  With DM on mounjaro , obesity, DJD, HLD, anxiety, L foot #, sinus Sx, hypothyroidism FU MASH cirrhosis with portal hypertension Here for medication refill-Lasix  and rifaximin  Doing well. History of Present Illness  She experiences persistent swelling and pain in her feet, describing her toes as resembling 'sausages.' The swelling is localized from the mid-foot downwards. She is currently taking Lasix  20 mg twice a day for fluid management. No excessive salt intake is reported.  Her diabetes is well-controlled, with an A1c of 5. She has been taken off all diabetes medications except for Mounjaro , which has contributed to significant weight loss.  No nausea, vomiting, heartburn, regurgitation, odynophagia or dysphagia.  No significant diarrhea or constipation.  No melena or hematochezia. No unintentional weight loss. No abdominal pain.  No change in mental status.  Does feel fatigued.  She experiences significant anxiety, exacerbated by the discontinuation of Ativan  by her primary care physician. She reports difficulty accessing  psychiatric care due to cost and availability.  She has a history of thyroid  issues, with consistently high TSH levels. Her thyroid  medication is taken regularly.  She has a history of sinus issues and surgeries, and occasionally smokes, particularly when stressed. She works third shift, which impacts her sleep quality.   Has been on Mounjaro   1/week- lost significant weight   Wt Readings from Last 3 Encounters:  12/11/23 214 lb (97.1 kg)  12/04/23 214 lb 2 oz (97.1 kg)  10/30/23 204 lb 12.8 oz (92.9 kg)        From previous notes:  Had Biliary colic (RUQ pain) , underwent lap chole with neg IOC and liver Bx 05/10/2020. Liver Bx reviewed at [facility] showed steatohepatitis with stage IV cirrhosis.  She does have FH of NASH cirrhosis (mom).  Unfortunately, her mom has passed away due to complications from diabetes.  Could not tolerate lactulose  due to diarrhea.  Denies having any significant change in her mental status.  She continues to be on rifaximin .  No skin lesions, easy bruisability, intake of OTC meds including diet pills, herbal medications, anabolic steroids or Tylenol . There is no H/O blood transfusions, IVDA. No jaundice, dark urine or pale stools. No alcohol abuse.    Past GI procedures: US  12/2022: Stable cirrhosis.  No focal lesions.  Splenomegaly.  No ascites. US  04/2011 1. Cirrhotic morphology of the liver.  No focal mass identified. 2. The splenic length was measured at 11.7 cm today versus greater than 13 cm previously. I suspect the splenic length may not have been accurately measured today and visually, I am still concerned for  splenomegaly although a volume was not obtained. 3. The common bile duct measures 6.7 mm which is mildly dilated, within normal limits given previous cholecystectomy.  Colon 07/2020 - One 6 mm polyp in the proximal ascending colon, removed with a cold snare. Resected and retrieved. Bx- TA - Four 6 to 8 mm polyps in the mid rectum and  in the distal sigmoid colon, removed with a cold snare. Resected and retrieved. Bx-hyperplastic. - Non-bleeding internal hemorrhoids. - The examined portion of the ileum was normal. - The examination was otherwise normal on direct and retroflexion views. - Negative random colonic biopsies. - Next due 07/2027  EGD 07/2020 - Mild gastritis. - Salmon-colored mucosa projecting 1 cm above GE junction. Bx- neg for Barrett's. - No esophageal varices. - Negative small bowel biopsies for celiac disease.  CT Abdo/pelvis with contrast 06/09/2020 1. Morphologic features of the liver compatible with cirrhosis. No suspicious liver lesions identified. 2. Splenomegaly. 3. Aortic atherosclerosis.  US  10/2020 1. Cirrhotic morphology of the liver. No focal liver lesion is identified. 2. Sequela of portal hypertension including splenomegaly. 3. Prior cholecystectomy. 4. No Ascites  Additional liver work-up -AFP 6.5 10/15/2020, 4.5 08/30/2021 -ANA 1:40 -Neg AMA, celiac serology, iron studies, alpha-1 antitrypsin, ASMA -Ammonia 55 -Not immune to hepatitis A/B, s/p vaccines - No EtOH. - Does occasionally smoke.    Additional GI history: Op note from lap chole 05/10/2020-reviewed Teresa Foy, MD General, Bariatric, & Minimally Invasive Surgery [facility] [place]) -Cirrhotic appearing liver. Bx-Liver Bx reviewed at [facility] showed steatohepatitis with stage IV cirrhosis. -IOC negative -No ascites or any obvious varices  Past Medical History:  Diagnosis Date   Allergies    Anxiety    Arthritis    Asthma    Back pain    Spinal Issue    Depression    Diabetes mellitus without complication (HCC)    DJD (degenerative joint disease), lumbar    Elevated cholesterol    Gallstone    GERD (gastroesophageal reflux disease)    Liver cirrhosis secondary to NASH (HCC)    Neuromuscular disorder (HCC)    Obesity    Oxygen deficiency    Thyroid  disease     Past Surgical History:   Procedure Laterality Date   ABDOMINAL HYSTERECTOMY  02/21/2003   CHOLECYSTECTOMY N/A 05/10/2020   Procedure: LAPAROSCOPIC CHOLECYSTECTOMY WITH INTRAOPERATIVE CHOLANGIOGRAM AND LIVER BIOPSY;  Surgeon: Castaneda Deward PARAS, MD;  Location: [facility];  Service: General;  Laterality: N/A;   DG SACROILIAC JOINTS ([facility] HX)  01/11/2021   [facility]   ENDOSCOPIC TURBINATE REDUCTION Bilateral 10/13/2020   Procedure: ENDOSCOPIC BILATERAL INFERIOR TURBINATE REDUCTIONS;  Surgeon: Ethyl Lonni BRAVO, MD;  Location: [facility];  Service: ENT;  Laterality: Bilateral;   ETHMOIDECTOMY Bilateral 10/13/2020   Procedure: BILATERAL TOTAL ETHMOIDECTOMY AND MAXILLARY OSTIA ENLARGEMENTS;  Surgeon: Ethyl Lonni BRAVO, MD;  Location: [facility];  Service: ENT;  Laterality: Bilateral;   LIVER BIOPSY     NASAL SINUS SURGERY Bilateral 10/13/2020   Procedure: SINUS ENDOSCOPY WITH STEALTH NAVIGATION;  Surgeon: Ethyl Lonni BRAVO, MD;  Location: [facility];  Service: ENT;  Laterality: Bilateral;   TONSILLECTOMY     TOTAL ABDOMINAL HYSTERECTOMY      Family History  Problem Relation Age of Onset   Coronary artery disease Mother    Diabetes Mother    COPD Mother    Coronary artery disease Father    Hypertension Father    Colon cancer Neg Hx  Esophageal cancer Neg Hx     Social History   Tobacco Use   Smoking status: Some Days    Current packs/day: 0.00    Average packs/day: 0.5 packs/day for 34.7 years (17.4 ttl pk-yrs)    Types: Cigarettes    Start date: 01/16/1985    Last attempt to quit: 10/08/2019    Years since quitting: 4.1   Smokeless tobacco: Never   Tobacco comments:    7- 8 cig a week  Vaping Use   Vaping status: Never Used  Substance Use Topics   Alcohol use: No   Drug use: No    Current Outpatient Medications  Medication Sig Dispense Refill   albuterol  (VENTOLIN  HFA) 108 (90 Base) MCG/ACT inhaler Inhale 2 puffs into the lungs  every 6 (six) hours as needed for wheezing or shortness of breath. 8 g 11   furosemide  (LASIX ) 20 MG tablet Take 1 tablet (20 mg total) by mouth 2 (two) times daily. 180 tablet 3   gabapentin  (NEURONTIN ) 300 MG capsule TAKE 1 TO 2 CAPSULES BY MOUTH IN THE MORNING AND  2  CAPSULES  AT  LUNCH  AND  4  CAPSULES  AT  NIGHT 240 capsule 0   levothyroxine  (SYNTHROID ) 175 MCG tablet Take 1 tablet (175 mcg total) by mouth daily. 90 tablet 3   omeprazole  (PRILOSEC) 40 MG capsule Take 1 capsule (40 mg total) by mouth daily. 90 capsule 3   omeprazole  (PRILOSEC) 40 MG capsule Take 1 capsule (40 mg total) by mouth daily. 90 capsule 3   amitriptyline  (ELAVIL ) 10 MG tablet TAKE 1 TABLET BY MOUTH AT BEDTIME 30 tablet 0   atorvastatin  (LIPITOR) 10 MG tablet Take 1 tablet (10 mg total) by mouth daily. 90 tablet 3   DULoxetine  (CYMBALTA ) 60 MG capsule Take 2 capsules by mouth once daily 90 capsule 0   Fluticasone -Umeclidin-Vilant (TRELEGY ELLIPTA ) 100-62.5-25 MCG/ACT AEPB Inhale 1 puff into the lungs daily. 60 each 5   levocetirizine (XYZAL ) 5 MG tablet Take 1 tablet (5 mg total) by mouth every evening. 90 tablet 3   rifaximin  (XIFAXAN ) 550 MG TABS tablet Take 1 tablet (550 mg total) by mouth 2 (two) times daily. (Patient taking differently: Take 550 mg by mouth every other day.) 60 tablet 12   tirzepatide  (MOUNJARO ) 5 MG/0.5ML Pen Inject 5 mg into the skin once a week. 6 mL 3   topiramate  (TOPAMAX ) 25 MG tablet Take 1 tablet (25 mg total) by mouth daily. 90 tablet 3   traMADol  (ULTRAM ) 50 MG tablet TAKE 2 TABLETS BY MOUTH EVERY 6 HOURS AS NEEDED 240 tablet 0   triamcinolone  (NASACORT ) 55 MCG/ACT AERO nasal inhaler Place 2 sprays into the nose daily. 1 each 12   Vitamin D, Ergocalciferol, (DRISDOL) 1.25 MG (50000 UNIT) CAPS capsule Take 50,000 Units by mouth once a week.     Current Facility-Administered Medications  Medication Dose Route Frequency Provider Last Rate Last Admin   0.9 %  sodium chloride  infusion  500  mL Intravenous Continuous Charlanne Groom, MD        Allergies  Allergen Reactions   Clarithromycin Other (See Comments)    Body cramps    Codeine Nausea And Vomiting   Penicillins Rash   Tylenol  [Acetaminophen ] Other (See Comments)    Can not take Tylenol  products due to liver    Review of Systems:  Constitutional: Denies fever, chills, diaphoresis, appetite change and has fatigue.  Has anxiety.     Physical Exam:  BP 113/73   Pulse 83   Temp 97.7 F (36.5 C) (Temporal)   Ht 5' 6 (1.676 m)   Wt 214 lb (97.1 kg)   SpO2 97%   BMI 34.54 kg/m  Wt Readings from Last 3 Encounters:  12/11/23 214 lb (97.1 kg)  12/04/23 214 lb 2 oz (97.1 kg)  10/30/23 204 lb 12.8 oz (92.9 kg)   Constitutional:  Well-developed, in no acute distress. Psychiatric: Normal mood and affect. Behavior is normal. HEENT: Pupils normal.  Conjunctivae are normal. No scleral icterus.  Cardiovascular: Normal rate, regular rhythm. No edema Pulmonary/chest: Effort normal and breath sounds normal. No wheezing, rales or rhonchi. Abdominal: Soft, nondistended. Nontender. Bowel sounds active throughout. There are no masses palpable.  Liver palpated 2 cm below costal margin.  No ascites. Rectal: Deferred Neurological: Alert and oriented to person place and time. Skin: Skin is warm and dry. No rashes noted. Extremities-no edema.   Data Reviewed: I have personally reviewed following labs and imaging studies  CBC:    Latest Ref Rng & Units 12/04/2023    9:34 AM 08/30/2021   10:06 AM 05/10/2021    8:45 AM  CBC  WBC 4.0 - 10.5 K/uL 7.8  5.3  8.7   Hemoglobin 12.0 - 15.0 g/dL 85.6  89.5  88.0   Hematocrit 36.0 - 46.0 % 42.7  33.4  37.0   Platelets 150.0 - 400.0 K/uL 157.0  110.0  150.0     CMP:    Latest Ref Rng & Units 12/04/2023    9:34 AM 04/30/2023    8:16 AM 05/12/2022    8:37 AM  CMP  Glucose 70 - 99 mg/dL 77  80  884   BUN 6 - 23 mg/dL 19  16  13    Creatinine 0.40 - 1.20 mg/dL 9.30  9.30  9.36    Sodium 135 - 145 mEq/L 138  140  137   Potassium 3.5 - 5.1 mEq/L 4.1  4.6  4.2   Chloride 96 - 112 mEq/L 103  107  104   CO2 19 - 32 mEq/L 27  26  25    Calcium  8.4 - 10.5 mg/dL 9.0  9.2  8.8   Total Protein 6.0 - 8.3 g/dL 7.7   7.4   Total Bilirubin 0.2 - 1.2 mg/dL 0.3   0.4   Alkaline Phos 39 - 117 U/L 72   96   AST 0 - 37 U/L 16   23   ALT 0 - 35 U/L 10   16       Latest Ref Rng & Units 12/04/2023    9:34 AM 05/12/2022    8:37 AM 09/13/2021   11:07 AM  Hepatic Function  Total Protein 6.0 - 8.3 g/dL 7.7  7.4  7.6   Albumin 3.5 - 5.2 g/dL 4.0  3.6    AST 0 - 37 U/L 16  23    ALT 0 - 35 U/L 10  16    Alk Phosphatase 39 - 117 U/L 72  96    Total Bilirubin 0.2 - 1.2 mg/dL 0.3  0.4       Radiology Studies: No results found.     Anselm Bring, MD 12/11/2023, 8:27 AM  Cc: Teresa Reyes SAUNDERS, MD

## 2023-12-11 NOTE — Progress Notes (Signed)
 Called to room to assist during endoscopic procedure.  Patient ID and intended procedure confirmed with present staff. Received instructions for my participation in the procedure from the performing physician.

## 2023-12-11 NOTE — Op Note (Signed)
 [facility] Patient Name: Teresa Castaneda Procedure Date: 12/11/2023 8:20 AM MRN: [id number] Endoscopist: Lynnie Bring , MD, [id number] Age: 53 Referring MD:  Date of Birth: [birth year] Gender: Female Account #: [id number] Procedure:                Upper GI endoscopy Indications:              Liver cirrhosis with portal hypertension for EV                            screening. Medicines:                Monitored Anesthesia Care Procedure:                Pre-Anesthesia Assessment:                           - Prior to the procedure, a History and Physical                            was performed, and patient medications and                            allergies were reviewed. The patient's tolerance of                            previous anesthesia was also reviewed. The risks                            and benefits of the procedure and the sedation                            options and risks were discussed with the patient.                            All questions were answered, and informed consent                            was obtained. Prior Anticoagulants: The patient has                            taken no anticoagulant or antiplatelet agents. ASA                            Grade Assessment: III - A patient with severe                            systemic disease. After reviewing the risks and                            benefits, the patient was deemed in satisfactory                            condition to undergo the procedure.  After obtaining informed consent, the endoscope was                            passed under direct vision. Throughout the                            procedure, the patient's blood pressure, pulse, and                            oxygen saturations were monitored continuously. The                            GIF [id number] #[id number] was introduced through the                            mouth, and advanced to the second part of  duodenum.                            The upper GI endoscopy was accomplished without                            difficulty. The patient tolerated the procedure                            well. Scope In: Scope Out: Findings:                 The examined esophagus was normal. No esophageal                            varices.                           The Z-line was irregular and was found 35 cm from                            the incisors. Prev Bx- neg for Barrett's.                           Five non-bleeding cratered gastric ulcers with no                            stigmata of bleeding were found in the gastric                            antrum and in the prepyloric region of the stomach.                            The largest lesion was 15 mm, deep, cratered,                            prepyloric area. The pylorus was minimally                            distorted. There  was no outlet obstruction..                            Biopsies were taken with a cold forceps for                            histology.                           Diffuse moderately erythematous mucosa without                            active bleeding and with no stigmata of bleeding                            was found in the duodenal bulb and in the first                            portion of the duodenum. Complications:            No immediate complications. Estimated Blood Loss:     Estimated blood loss: none. Impression:               - Gastric ulcers with no stigmata of bleeding.                            Biopsied.                           - No varices. Recommendation:           - Patient has a contact number available for                            emergencies. The signs and symptoms of potential                            delayed complications were discussed with the                            patient. Return to normal activities tomorrow.                            Written discharge instructions were  provided to the                            patient.                           - Resume previous diet.                           - Continue present medications.                           - No ibuprofen , naproxen, or other non-steroidal  anti-inflammatory drugs.                           - Start Protonix  (pantoprazole ) 40 mg PO BID #60,                            11RF. Can stop omeprazole .                           - Repeat upper endoscopy in 12 weeks to evaluate                            the response to therapy.                           - The findings and recommendations were discussed                            with the patient's family. Lynnie Bring, MD 12/11/2023 8:51:15 AM This report has been signed electronically.

## 2023-12-11 NOTE — Progress Notes (Signed)
 Sedate, gd SR, tolerated procedure well, VSS, report to RN

## 2023-12-12 ENCOUNTER — Telehealth: Payer: Self-pay

## 2023-12-12 NOTE — Telephone Encounter (Signed)
  Follow up Call-     12/11/2023    7:37 AM  Call back number  Post procedure Call Back phone  # [phone]  Permission to leave phone message Yes     Patient questions:  Do you have a fever, pain , or abdominal swelling? No. Pain Score  0 *  Have you tolerated food without any problems? Yes.    Have you been able to return to your normal activities? Yes.    Do you have any questions about your discharge instructions: Diet   No. Medications  No. Follow up visit  No.  Do you have questions or concerns about your Care? No.  Actions: * If pain score is 4 or above: No action needed, pain <4.

## 2023-12-14 LAB — SURGICAL PATHOLOGY

## 2023-12-17 ENCOUNTER — Ambulatory Visit (HOSPITAL_COMMUNITY)
Admission: RE | Admit: 2023-12-17 | Discharge: 2023-12-17 | Disposition: A | Discharge status: Home / Self Care | Source: Ambulatory Visit | Attending: Gastroenterology | Admitting: Gastroenterology

## 2023-12-17 ENCOUNTER — Ambulatory Visit: Payer: Self-pay | Admitting: Gastroenterology

## 2023-12-17 DIAGNOSIS — K7682 Hepatic encephalopathy: Secondary | ICD-10-CM | POA: Insufficient documentation

## 2023-12-17 DIAGNOSIS — Z8719 Personal history of other diseases of the digestive system: Secondary | ICD-10-CM | POA: Diagnosis present

## 2023-12-17 DIAGNOSIS — K7469 Other cirrhosis of liver: Secondary | ICD-10-CM | POA: Diagnosis present

## 2023-12-17 DIAGNOSIS — K7581 Nonalcoholic steatohepatitis (NASH): Secondary | ICD-10-CM | POA: Diagnosis present

## 2023-12-21 ENCOUNTER — Other Ambulatory Visit: Payer: Self-pay | Admitting: Physical Medicine and Rehabilitation

## 2023-12-28 ENCOUNTER — Encounter: Payer: Self-pay | Admitting: Registered Nurse

## 2023-12-28 ENCOUNTER — Encounter: Attending: Registered Nurse | Admitting: Registered Nurse

## 2023-12-28 VITALS — BP 103/71 | HR 100 | Ht 66.0 in | Wt 204.0 lb

## 2023-12-28 DIAGNOSIS — M25512 Pain in left shoulder: Secondary | ICD-10-CM

## 2023-12-28 DIAGNOSIS — G8929 Other chronic pain: Secondary | ICD-10-CM | POA: Diagnosis present

## 2023-12-28 DIAGNOSIS — Z79891 Long term (current) use of opiate analgesic: Secondary | ICD-10-CM

## 2023-12-28 DIAGNOSIS — M545 Low back pain, unspecified: Secondary | ICD-10-CM | POA: Diagnosis not present

## 2023-12-28 DIAGNOSIS — Z5181 Encounter for therapeutic drug level monitoring: Secondary | ICD-10-CM

## 2023-12-28 DIAGNOSIS — G894 Chronic pain syndrome: Secondary | ICD-10-CM

## 2023-12-28 DIAGNOSIS — G6289 Other specified polyneuropathies: Secondary | ICD-10-CM | POA: Insufficient documentation

## 2023-12-28 MED ORDER — TRAMADOL HCL 50 MG PO TABS: 100.0000 mg | ORAL_TABLET | Freq: Four times a day (QID) | ORAL | 5 refills | Status: AC | PRN

## 2023-12-28 NOTE — Progress Notes (Signed)
 Subjective:    Patient ID: Teresa Castaneda, female    DOB: 01-22-1970, 53 y.o.   MRN: [id number]  HPI: Teresa Castaneda is a 53 y.o. female who returns for follow up appointment for chronic pain and medication refill. She states her pain is located in her left shoulder for the last three weeks, she denies falling, X-ray ordered. Also reports lower back pain and bilateral feet with tingling and burning.  She rates her pain 6. Her current exercise regime is walking and performing stretching exercises.  Ms. Heinle Morphine  equivalent is 80.00 MME.   Oral Swab was Performed today.    Pain Inventory Average Pain 8 Pain Right Now 6 My pain is constant, sharp, burning, stabbing, tingling, and aching  In the last 24 hours, has pain interfered with the following? General activity 5 Relation with others 5 Enjoyment of life 6 What TIME of day is your pain at its worst? morning  and night Sleep (in general) Fair  Pain is worse with: walking and sitting Pain improves with: medication and ice Relief from Meds: 7  Family History  Problem Relation Age of Onset   Coronary artery disease Mother    Diabetes Mother    COPD Mother    Coronary artery disease Father    Hypertension Father    Colon cancer Neg Hx    Esophageal cancer Neg Hx    Social History   Socioeconomic History   Marital status: Married    Spouse name: Not on file   Number of children: Not on file   Years of education: Not on file   Highest education level: GED or equivalent  Occupational History   Occupation: Development Worker, [employer]  Tobacco Use   Smoking status: Some Days    Current packs/day: 0.00    Average packs/day: 0.5 packs/day for 34.7 years (17.4 ttl pk-yrs)    Types: Cigarettes    Start date: 01/16/1985    Last attempt to quit: 10/08/2019    Years since quitting: 4.2   Smokeless tobacco: Never   Tobacco comments:    7- 8 cig a week  Vaping Use   Vaping status: Never Used  Substance and Sexual Activity   Alcohol  use: No   Drug use: No   Sexual activity: Yes    Birth control/protection: Surgical  Other Topics Concern   Not on file  Social History Narrative   Right Handed    Caffeine rare   Live with husband and mother in law in a two story hpme   Social Drivers of Health   Tobacco Use: High Risk (12/11/2023)   Patient History    Smoking Tobacco Use: Some Days    Smokeless Tobacco Use: Never    Passive Exposure: Not on file  Financial Resource Strain: Low Risk (07/06/2023)   Overall Financial Resource Strain (CARDIA)    Difficulty of Paying Living Expenses: Not hard at all  Food Insecurity: No Food Insecurity (07/06/2023)   Epic    Worried About Radiation Protection Practitioner of Food in the Last Year: Never true    Ran Out of Food in the Last Year: Never true  Transportation Needs: No Transportation Needs (07/06/2023)   Epic    Lack of Transportation (Medical): No    Lack of Transportation (Non-Medical): No  Physical Activity: Insufficiently Active (07/06/2023)   Exercise Vital Sign    Days of Exercise per Week: 2 days    Minutes of Exercise per Session: 10 min  Stress: Stress  Concern Present (07/06/2023)   [employer] of Occupational Health - Occupational Stress Questionnaire    Feeling of Stress: To some extent  Social Connections: Moderately Isolated (07/06/2023)   Social Connection and Isolation Panel    Frequency of Communication with Friends and Family: Once a week    Frequency of Social Gatherings with Friends and Family: Once a week    Attends Religious Services: 1 to 4 times per year    Active Member of [employer] or Organizations: No    Attends Banker Meetings: Not on file    Marital Status: Married  Depression (PHQ2-9): Medium Risk (10/01/2023)   Depression (PHQ2-9)    PHQ-2 Score: 9  Alcohol Screen: Not on file  Housing: Low Risk (07/06/2023)   Epic    Unable to Pay for Housing in the Last Year: No    Number of Times Moved in the Last Year: 0    Homeless in the Last Year: No   Utilities: Not on file  Health Literacy: Not on file   Past Surgical History:  Procedure Laterality Date   ABDOMINAL HYSTERECTOMY  02/21/2003   CHOLECYSTECTOMY N/A 05/10/2020   Procedure: LAPAROSCOPIC CHOLECYSTECTOMY WITH INTRAOPERATIVE CHOLANGIOGRAM AND LIVER BIOPSY;  Surgeon: Lyndel Deward PARAS, MD;  Location: [facility];  Service: General;  Laterality: N/A;   DG SACROILIAC JOINTS ([facility] HX)  01/11/2021   [facility]   ENDOSCOPIC TURBINATE REDUCTION Bilateral 10/13/2020   Procedure: ENDOSCOPIC BILATERAL INFERIOR TURBINATE REDUCTIONS;  Surgeon: Ethyl Lonni BRAVO, MD;  Location: [facility];  Service: ENT;  Laterality: Bilateral;   ETHMOIDECTOMY Bilateral 10/13/2020   Procedure: BILATERAL TOTAL ETHMOIDECTOMY AND MAXILLARY OSTIA ENLARGEMENTS;  Surgeon: Ethyl Lonni BRAVO, MD;  Location: [facility];  Service: ENT;  Laterality: Bilateral;   LIVER BIOPSY     NASAL SINUS SURGERY Bilateral 10/13/2020   Procedure: SINUS ENDOSCOPY WITH STEALTH NAVIGATION;  Surgeon: Ethyl Lonni BRAVO, MD;  Location: [facility];  Service: ENT;  Laterality: Bilateral;   TONSILLECTOMY     TOTAL ABDOMINAL HYSTERECTOMY     Past Surgical History:  Procedure Laterality Date   ABDOMINAL HYSTERECTOMY  02/21/2003   CHOLECYSTECTOMY N/A 05/10/2020   Procedure: LAPAROSCOPIC CHOLECYSTECTOMY WITH INTRAOPERATIVE CHOLANGIOGRAM AND LIVER BIOPSY;  Surgeon: Lyndel Deward PARAS, MD;  Location: [facility];  Service: General;  Laterality: N/A;   DG SACROILIAC JOINTS ([facility] HX)  01/11/2021   [facility]   ENDOSCOPIC TURBINATE REDUCTION Bilateral 10/13/2020   Procedure: ENDOSCOPIC BILATERAL INFERIOR TURBINATE REDUCTIONS;  Surgeon: Ethyl Lonni BRAVO, MD;  Location: [facility];  Service: ENT;  Laterality: Bilateral;   ETHMOIDECTOMY Bilateral 10/13/2020   Procedure: BILATERAL TOTAL ETHMOIDECTOMY AND MAXILLARY OSTIA ENLARGEMENTS;  Surgeon: Ethyl Lonni BRAVO, MD;  Location: [facility];  Service: ENT;  Laterality: Bilateral;   LIVER BIOPSY     NASAL SINUS SURGERY Bilateral 10/13/2020   Procedure: SINUS ENDOSCOPY WITH STEALTH NAVIGATION;  Surgeon: Ethyl Lonni BRAVO, MD;  Location: [facility];  Service: ENT;  Laterality: Bilateral;   TONSILLECTOMY     TOTAL ABDOMINAL HYSTERECTOMY     Past Medical History:  Diagnosis Date   Allergies    Anxiety    Arthritis    Asthma    Back pain    Spinal Issue    Depression    Diabetes mellitus without complication (HCC)    DJD (degenerative joint disease), lumbar    Elevated cholesterol    Gallstone    GERD (gastroesophageal  reflux disease)    Liver cirrhosis secondary to NASH (HCC)    Neuromuscular disorder (HCC)    Obesity    Oxygen deficiency    Thyroid  disease    BP 103/71   Pulse (!) 101   Ht 5' 6 (1.676 m)   Wt 204 lb (92.5 kg)   SpO2 99%   BMI 32.93 kg/m   Opioid Risk Score:   Fall Risk Score:  `1  Depression screen [facility] 2/9     10/01/2023   11:18 AM 12/20/2022    9:01 AM 11/20/2022    9:06 AM 08/18/2022   10:59 AM 05/18/2022    9:58 AM 04/19/2022    8:32 AM 04/19/2022    8:31 AM  Depression screen PHQ 2/9  Decreased Interest 1 3 0 0 1 1 0  Down, Depressed, Hopeless 1 3 0 0 1 1 0  PHQ - 2 Score 2 6 0 0 2 2 0  Altered sleeping 2     3   Tired, decreased energy 3     3   Change in appetite 1     2   Feeling bad or failure about yourself  1     1   Trouble concentrating 0     1   Moving slowly or fidgety/restless 0     1   Suicidal thoughts 0     0   PHQ-9 Score 9      13    Difficult doing work/chores Somewhat difficult     Somewhat difficult      Data saved with a previous flowsheet row definition    Review of Systems  Musculoskeletal:        Pain in fingers on both hands Pain in both feet  All other systems reviewed and are negative.      Objective:   Physical Exam Vitals and nursing note reviewed.  Constitutional:       Appearance: Normal appearance.  Cardiovascular:     Rate and Rhythm: Normal rate and regular rhythm.     Pulses: Normal pulses.     Heart sounds: Normal heart sounds.  Pulmonary:     Effort: Pulmonary effort is normal.     Breath sounds: Normal breath sounds.  Musculoskeletal:     Comments: Normal Muscle Bulk and Muscle Testing Reveals:  Upper Extremities: Right: Full ROM and Muscle Strength 5/5 Left Upper extremity: Decreased ROM  90 Degrees and Muscle Strength 5/5  Lumbar Paraspinal Tenderness: L-4-L-5 Lower Extremities: Full ROM and Muscle Strength 5/5 Arises from chair slowly Antalgic  Gait     Skin:    General: Skin is warm and dry.  Neurological:     Mental Status: She is alert and oriented to person, place, and time.  Psychiatric:        Mood and Affect: Mood normal.        Behavior: Behavior normal.          Assessment & Plan:  Chronic Bilateral Low Back Pain without Sciatica: Continue HEP as Tolerated. Continue to Monitor. 12/28/2023 Polyneuropathy: Ms. Nodine states when she received the last two treatments of Qutenza  , she developed increase intensity of  a burning sensation,   (reports like fire), she is not interested in receiving Qutenza  in the future. . Continue Gabapentin : PCP Following.  12/28/2023 Continue  Tramadol  50 mg two tablets every 6 hours as needed for pain #240. We will continue the opioid monitoring program, this consists of regular clinic  visits, examinations, urine drug screen, pill counts as well as use of [place]  Controlled Substance Reporting system. A 12 month History has been reviewed on the [place]  Controlled Substance Reporting System on 06/28/2023 3. Acute Left Shoulder Pain: Denies Falling. RX: X-ray.   F/U in 6 months

## 2024-01-03 LAB — DRUG TOX MONITOR 1 W/CONF, ORAL FLD
Amphetamines: NEGATIVE ng/mL (ref <10)
Barbiturates: NEGATIVE ng/mL (ref <10)
Benzodiazepines: NEGATIVE ng/mL (ref <0.50)
Buprenorphine: NEGATIVE ng/mL (ref <0.10)
Cocaine: NEGATIVE ng/mL (ref <5.0)
Cotinine: >250 ng/mL — ABNORMAL HIGH (ref <5.0)
Fentanyl: NEGATIVE ng/mL (ref <0.10)
Heroin Metabolite: NEGATIVE ng/mL (ref <1.0)
MARIJUANA: NEGATIVE ng/mL (ref <2.5)
MDMA: NEGATIVE ng/mL (ref <10)
Meprobamate: NEGATIVE ng/mL (ref <2.5)
Methadone: NEGATIVE ng/mL (ref <5.0)
Nicotine Metabolite: POSITIVE ng/mL — AB (ref <5.0)
Opiates: NEGATIVE ng/mL (ref <2.5)
Phencyclidine: NEGATIVE ng/mL (ref <10)
Tapentadol: NEGATIVE ng/mL (ref <5.0)
Tramadol: >500 ng/mL — ABNORMAL HIGH (ref <5.0)
Tramadol: POSITIVE ng/mL — AB (ref <5.0)
Zolpidem: NEGATIVE ng/mL (ref <5.0)

## 2024-01-03 LAB — DRUG TOX ALC METAB W/CON, ORAL FLD: Alcohol Metabolite: NEGATIVE ng/mL (ref <25)

## 2024-01-05 ENCOUNTER — Other Ambulatory Visit: Payer: Self-pay | Admitting: Family Medicine

## 2024-01-05 DIAGNOSIS — E1165 Type 2 diabetes mellitus with hyperglycemia: Secondary | ICD-10-CM

## 2024-01-21 ENCOUNTER — Telehealth: Payer: Self-pay

## 2024-01-21 NOTE — Telephone Encounter (Signed)
 LVM for patient to schedule an EGD in march to recheck her gastric ulcer. She will need a previsit as well

## 2024-01-23 NOTE — Telephone Encounter (Signed)
 Scheduled 3-13 at 730 for procedure and 03-12-24 at 8 for telephone visit with nurse. Patient requested a Monday or Friday for the procedure.

## 2024-02-01 ENCOUNTER — Other Ambulatory Visit (HOSPITAL_COMMUNITY): Payer: Self-pay

## 2024-02-01 ENCOUNTER — Encounter: Payer: Self-pay | Admitting: Pharmacy Technician

## 2024-02-01 NOTE — Telephone Encounter (Signed)
 error

## 2024-02-06 ENCOUNTER — Ambulatory Visit: Admitting: Family Medicine

## 2024-02-06 ENCOUNTER — Encounter: Payer: Self-pay | Admitting: Family Medicine

## 2024-02-06 VITALS — BP 98/74 | HR 87 | Temp 97.9°F | Resp 16

## 2024-02-06 DIAGNOSIS — R110 Nausea: Secondary | ICD-10-CM

## 2024-02-06 DIAGNOSIS — R197 Diarrhea, unspecified: Secondary | ICD-10-CM

## 2024-02-06 DIAGNOSIS — R5383 Other fatigue: Secondary | ICD-10-CM | POA: Diagnosis not present

## 2024-02-06 DIAGNOSIS — R1084 Generalized abdominal pain: Secondary | ICD-10-CM | POA: Diagnosis not present

## 2024-02-06 LAB — CBC
HCT: 42.1 % (ref 36.0–46.0)
Hemoglobin: 14.1 g/dL (ref 12.0–15.0)
MCHC: 33.4 g/dL (ref 30.0–36.0)
MCV: 86.8 fl (ref 78.0–100.0)
Platelets: 138 K/uL — ABNORMAL LOW (ref 150.0–400.0)
RBC: 4.85 Mil/uL (ref 3.87–5.11)
RDW: 14.5 % (ref 11.5–15.5)
WBC: 6.6 K/uL (ref 4.0–10.5)

## 2024-02-06 LAB — BASIC METABOLIC PANEL WITH GFR
BUN: 11 mg/dL (ref 6–23)
CO2: 31 meq/L (ref 19–32)
Calcium: 9.3 mg/dL (ref 8.4–10.5)
Chloride: 106 meq/L (ref 96–112)
Creatinine, Ser: 0.86 mg/dL (ref 0.40–1.20)
GFR: 77.8 mL/min
Glucose, Bld: 85 mg/dL (ref 70–99)
Potassium: 4.3 meq/L (ref 3.5–5.1)
Sodium: 141 meq/L (ref 135–145)

## 2024-02-06 MED ORDER — ONDANSETRON HCL 4 MG PO TABS: 4.0000 mg | ORAL_TABLET | Freq: Three times a day (TID) | ORAL | 0 refills | Status: AC | PRN

## 2024-02-06 NOTE — Patient Instructions (Signed)
 Unfortunately I think you probably have picked up a similar virus as coworkers.  Continue to drink plenty of fluids.  I did send in some Zofran  to help with nausea if needed.  Fluids are more important than food at this time but if you do feel like things are improving, start with crackers or bland food initially.  If any concerns on labs I will let you know.  If you are not improving in the next 24 to 48 hours I do want you to be seen again as would likely recommend further testing including stool testing at that time.  If any worsening symptoms in the interim be seen here, urgent care or the ER.  Hang in there.  Viral Gastroenteritis, Adult  Viral gastroenteritis is also known as the stomach flu. This condition may affect your stomach, small intestine, and large intestine. It can cause sudden watery diarrhea, fever, and vomiting. This condition is caused by many different viruses. These viruses can be passed from person to person very easily (are contagious). Diarrhea and vomiting can make you feel weak and cause you to become dehydrated. You may not be able to keep fluids down. Dehydration can make you tired and thirsty, cause you to have a dry mouth, and decrease how often you urinate. It is important to replace the fluids that you lose from diarrhea and vomiting. What are the causes? Gastroenteritis is caused by many viruses, including rotavirus and norovirus. Norovirus is the most common cause in adults. You can get sick after being exposed to the viruses from other people. You can also get sick by: Eating food, drinking water, or touching a surface contaminated with one of these viruses. Sharing utensils or other personal items with an infected person. What increases the risk? You are more likely to develop this condition if you: Have a weak body defense system (immune system). Live with one or more children who are younger than 2 years. Live in a nursing home. Travel on cruise ships. What are  the signs or symptoms? Symptoms of this condition start suddenly 1-3 days after exposure to a virus. Symptoms may last for a few days or for as long as a week. Common symptoms include watery diarrhea and vomiting. Other symptoms include: Fever. Headache. Fatigue. Pain in the abdomen. Chills. Weakness. Nausea. Muscle aches. Loss of appetite. How is this diagnosed? This condition is diagnosed with a medical history and physical exam. You may also have a stool test to check for viruses or other infections. How is this treated? This condition typically goes away on its own. The focus of treatment is to prevent dehydration and restore lost fluids (rehydration). This condition may be treated with: An oral rehydration solution (ORS) to replace important salts and minerals (electrolytes) in your body. Take this if told by your health care provider. This is a drink that is sold at pharmacies and retail stores. Medicines to help with your symptoms. Probiotic supplements to reduce symptoms of diarrhea. Fluids given through an IV, if dehydration is severe. Older adults and people with other diseases or a weak immune system are at higher risk for dehydration. Follow these instructions at home: Eating and drinking  Take an ORS as told by your health care provider. Drink clear fluids in small amounts as you are able. Clear fluids include: Water. Ice chips. Diluted fruit juice. Low-calorie sports drinks. Drink enough fluid to keep your urine pale yellow. Eat small amounts of healthy foods every 3-4 hours as you are  able. This may include whole grains, fruits, vegetables, lean meats, and yogurt. Avoid fluids that contain a lot of sugar or caffeine, such as energy drinks, sports drinks, and soda. Avoid spicy or fatty foods. Avoid alcohol. General instructions  Wash your hands often, especially after having diarrhea or vomiting. If soap and water are not available, use hand sanitizer. Make sure  that all people in your household wash their hands well and often. Take over-the-counter and prescription medicines only as told by your health care provider. Rest at home while you recover. Watch your condition for any changes. Take a warm bath to relieve any burning or pain from frequent diarrhea episodes. Keep all follow-up visits. This is important. Contact a health care provider if you: Cannot keep fluids down. Have symptoms that get worse. Have new symptoms. Feel light-headed or dizzy. Have muscle cramps. Get help right away if you: Have chest pain. Have trouble breathing or you are breathing very quickly. Have a fast heartbeat. Feel extremely weak or you faint. Have a severe headache, a stiff neck, or both. Have a rash. Have severe pain, cramping, or bloating in your abdomen. Have skin that feels cold and clammy. Feel confused. Have pain when you urinate. Have signs of dehydration, such as: Dark urine, very little urine, or no urine. Cracked lips. Dry mouth. Sunken eyes. Sleepiness. Weakness. Have signs of bleeding, such as: Seeing blood in your vomit. Having vomit that looks like coffee grounds. Having bloody or black stools or stools that look like tar. These symptoms may be an emergency. Get help right away. Call 911. Do not wait to see if the symptoms will go away. Do not drive yourself to the hospital. Summary Viral gastroenteritis is also known as the stomach flu. It can cause sudden watery diarrhea, fever, and vomiting. This condition can be passed from person to person very easily (is contagious). Take an oral rehydration solution (ORS) if told by your health care provider. This is a drink that is sold at pharmacies and retail stores. Wash your hands often, especially after having diarrhea or vomiting. If soap and water are not available, use hand sanitizer. This information is not intended to replace advice given to you by your health care provider. Make sure  you discuss any questions you have with your health care provider. Document Revised: 11/01/2020 Document Reviewed: 11/01/2020 Elsevier Patient Education  2024 [employer].

## 2024-02-06 NOTE — Progress Notes (Signed)
 "  Subjective:  Patient ID: Teresa Castaneda, female    DOB: [birth year]  Age: 54 y.o. MRN: [id number]  CC:  Chief Complaint  Patient presents with   Acute Visit    Nauseous, upset stomach, possible stomach bug and diarrhea. Sx started last night. Same virus was going around at work. Fatigued and run down    HPI Teresa Castaneda presents for  Acute visit for above  Nausea, upset stomach, diarrhea Symptoms started yesterday with nausea, fatigue, diarrhea.  Multiple sick contacts at work with similar symptoms. Unknown specific name of infection.  Starter with nausea, diarrhea yesterday around 5. Multiple overnight, 4-5 times today. Unable to eat d/t nausea. Piece of toast - diarrhea 3 times.  Able to drink some water. Last uop - just prior to coming into office- normally. Has been drinking water - not diet soda since sick.  No fever.  No vomiting, just nausea.  Hx of cholecystectomy.  Tx: immodium - 4 times, minimal relief last night. 2 this am.   No recent antibiotics. No recent hospitalizations.  Chronic sinus congestion, cough, no recent changes.  History Patient Active Problem List   Diagnosis Date Noted   Recurrent sinusitis 05/12/2022   Type 2 diabetes mellitus with diabetic neuropathy, without long-term current use of insulin  (HCC) 05/12/2022   Cirrhosis of liver (HCC) 06/23/2020   Symptomatic cholelithiasis 05/10/2020   Acquired hypothyroidism 04/05/2020   Weight gain 04/05/2020   Type 2 diabetes mellitus with hyperglycemia, without long-term current use of insulin  (HCC) 04/05/2020   History of COVID-19 09/23/2019   Pneumonia due to COVID-19 virus 09/23/2019   Cough 09/23/2019   BMI 40.0-44.9, adult (HCC) 07/22/2019   Facet arthropathy, lumbar 07/22/2019   Sacroiliitis 07/22/2019   Asthma 09/30/2016   Tobacco use 09/30/2016   Adverse effect of drug 08/04/2016   ANXIETY 09/10/2006   DEPRESSION 09/10/2006   ASTHMA 09/10/2006   HEADACHE 09/10/2006   Past Medical  History:  Diagnosis Date   Allergies    Anxiety    Arthritis    Asthma    Back pain    Spinal Issue    Depression    Diabetes mellitus without complication (HCC)    DJD (degenerative joint disease), lumbar    Elevated cholesterol    Gallstone    GERD (gastroesophageal reflux disease)    Liver cirrhosis secondary to NASH (HCC)    Neuromuscular disorder (HCC)    Obesity    Oxygen deficiency    Thyroid  disease    Past Surgical History:  Procedure Laterality Date   ABDOMINAL HYSTERECTOMY  02/21/2003   CHOLECYSTECTOMY N/A 05/10/2020   Procedure: LAPAROSCOPIC CHOLECYSTECTOMY WITH INTRAOPERATIVE CHOLANGIOGRAM AND LIVER BIOPSY;  Surgeon: Lyndel Deward PARAS, MD;  Location: [facility];  Service: General;  Laterality: N/A;   DG SACROILIAC JOINTS ([facility] HX)  01/11/2021   [facility]   ENDOSCOPIC TURBINATE REDUCTION Bilateral 10/13/2020   Procedure: ENDOSCOPIC BILATERAL INFERIOR TURBINATE REDUCTIONS;  Surgeon: Ethyl Lonni BRAVO, MD;  Location: [facility];  Service: ENT;  Laterality: Bilateral;   ETHMOIDECTOMY Bilateral 10/13/2020   Procedure: BILATERAL TOTAL ETHMOIDECTOMY AND MAXILLARY OSTIA ENLARGEMENTS;  Surgeon: Ethyl Lonni BRAVO, MD;  Location: [facility];  Service: ENT;  Laterality: Bilateral;   LIVER BIOPSY     NASAL SINUS SURGERY Bilateral 10/13/2020   Procedure: SINUS ENDOSCOPY WITH STEALTH NAVIGATION;  Surgeon: Ethyl Lonni BRAVO, MD;  Location: [facility];  Service: ENT;  Laterality: Bilateral;   TONSILLECTOMY  TOTAL ABDOMINAL HYSTERECTOMY     Allergies[1] Prior to Admission medications  Medication Sig Start Date End Date Taking? Authorizing Provider  albuterol  (VENTOLIN  HFA) 108 (90 Base) MCG/ACT inhaler Inhale 2 puffs into the lungs every 6 (six) hours as needed for wheezing or shortness of breath. 10/01/23  Yes Levora Reyes SAUNDERS, MD  amitriptyline  (ELAVIL ) 10 MG tablet TAKE 1 TABLET BY MOUTH AT BEDTIME 12/26/22   Yes Raulkar, Sven SQUIBB, MD  atorvastatin  (LIPITOR) 10 MG tablet Take 1 tablet (10 mg total) by mouth daily. 05/01/23  Yes Shamleffer, Ibtehal Jaralla, MD  DULoxetine  (CYMBALTA ) 60 MG capsule Take 2 capsules by mouth once daily 01/11/23  Yes Tabori, Katherine E, MD  Fluticasone -Umeclidin-Vilant (TRELEGY ELLIPTA ) 100-62.5-25 MCG/ACT AEPB Inhale 1 puff into the lungs daily. 10/01/23  Yes Levora Reyes SAUNDERS, MD  furosemide  (LASIX ) 20 MG tablet Take 1 tablet (20 mg total) by mouth 2 (two) times daily. 12/04/23  Yes Charlanne Groom, MD  gabapentin  (NEURONTIN ) 300 MG capsule TAKE 1 TO 2 CAPSULES BY MOUTH IN THE MORNING, 2 CAPSULES AT LUNCH AND 4 CAPSULES AT NIGHT 01/07/24  Yes Levora Reyes SAUNDERS, MD  levocetirizine (XYZAL ) 5 MG tablet Take 1 tablet (5 mg total) by mouth every evening. 05/02/21  Yes Levora Reyes SAUNDERS, MD  levothyroxine  (SYNTHROID ) 175 MCG tablet Take 1 tablet (175 mcg total) by mouth daily. 10/31/23  Yes Shamleffer, Ibtehal Jaralla, MD  pantoprazole  (PROTONIX ) 40 MG tablet Take 1 tablet (40 mg total) by mouth 2 (two) times daily. 12/11/23  Yes Charlanne Groom, MD  tirzepatide  (MOUNJARO ) 5 MG/0.5ML Pen Inject 5 mg into the skin once a week. 10/30/23  Yes Shamleffer, Ibtehal Jaralla, MD  topiramate  (TOPAMAX ) 25 MG tablet Take 1 tablet (25 mg total) by mouth daily. 08/18/22  Yes Raulkar, Sven SQUIBB, MD  traMADol  (ULTRAM ) 50 MG tablet Take 2 tablets (100 mg total) by mouth every 6 (six) hours as needed. 12/28/23  Yes Debby Fidela CROME, NP  triamcinolone  (NASACORT ) 55 MCG/ACT AERO nasal inhaler Place 2 sprays into the nose daily. 11/04/21  Yes Levora Reyes SAUNDERS, MD  Vitamin D, Ergocalciferol, (DRISDOL) 1.25 MG (50000 UNIT) CAPS capsule Take 50,000 Units by mouth once a week. 10/01/22  Yes [provider]  rifaximin  (XIFAXAN ) 550 MG TABS tablet Take 1 tablet (550 mg total) by mouth 2 (two) times daily. Patient taking differently: Take 550 mg by mouth every other day. 11/25/21   Charlanne Groom, MD    Social History   Socioeconomic History   Marital status: Married    Spouse name: Not on file   Number of children: Not on file   Years of education: Not on file   Highest education level: GED or equivalent  Occupational History   Occupation: Development Worker, [employer]  Tobacco Use   Smoking status: Some Days    Current packs/day: 0.00    Average packs/day: 0.5 packs/day for 34.7 years (17.4 ttl pk-yrs)    Types: Cigarettes    Start date: 01/16/1985    Last attempt to quit: 10/08/2019    Years since quitting: 4.3   Smokeless tobacco: Never   Tobacco comments:    7- 8 cig a week  Vaping Use   Vaping status: Never Used  Substance and Sexual Activity   Alcohol use: Never   Drug use: No   Sexual activity: Yes    Birth control/protection: Surgical  Other Topics Concern   Not on file  Social History Narrative   Right Handed    Caffeine  rare   Live with husband and mother in law in a two story hpme   Social Drivers of Health   Tobacco Use: High Risk (02/06/2024)   Patient History    Smoking Tobacco Use: Some Days    Smokeless Tobacco Use: Never    Passive Exposure: Not on file  Financial Resource Strain: Low Risk (02/06/2024)   Overall Financial Resource Strain (CARDIA)    Difficulty of Paying Living Expenses: Not hard at all  Food Insecurity: No Food Insecurity (02/06/2024)   Epic    Worried About Radiation Protection Practitioner of Food in the Last Year: Never true    Ran Out of Food in the Last Year: Never true  Transportation Needs: No Transportation Needs (02/06/2024)   Epic    Lack of Transportation (Medical): No    Lack of Transportation (Non-Medical): No  Physical Activity: Insufficiently Active (02/06/2024)   Exercise Vital Sign    Days of Exercise per Week: 1 day    Minutes of Exercise per Session: 10 min  Stress: Stress Concern Present (02/06/2024)   [employer] of Occupational Health - Occupational Stress Questionnaire    Feeling of Stress: To some extent  Social Connections: Unknown  (02/06/2024)   Social Connection and Isolation Panel    Frequency of Communication with Friends and Family: Once a week    Frequency of Social Gatherings with Friends and Family: Patient declined    Attends Religious Services: Patient declined    Database Administrator or Organizations: No    Attends Engineer, Structural: Not on file    Marital Status: Married  Catering Manager Violence: Not on file  Depression (PHQ2-9): Low Risk (12/28/2023)   Depression (PHQ2-9)    PHQ-2 Score: 0  Recent Concern: Depression (PHQ2-9) - Medium Risk (10/01/2023)   Depression (PHQ2-9)    PHQ-2 Score: 9  Alcohol Screen: Not on file  Housing: Low Risk (02/06/2024)   Epic    Unable to Pay for Housing in the Last Year: No    Number of Times Moved in the Last Year: 0    Homeless in the Last Year: No  Utilities: Not on file  Health Literacy: Not on file    Review of Systems Per HPI.   Objective:   Vitals:   02/06/24 1345  BP: 98/74  Pulse: 87  Resp: 16  Temp: 97.9 F (36.6 C)  TempSrc: Temporal  SpO2: 97%   BP Readings from Last 3 Encounters:  02/06/24 98/74  12/28/23 103/71  12/11/23 (!) 95/56      Physical Exam Vitals reviewed.  Constitutional:      Appearance: Normal appearance. She is well-developed.     Comments: Appears uncomfortable but nontoxic-appearing  HENT:     Head: Normocephalic and atraumatic.  Eyes:     Conjunctiva/sclera: Conjunctivae normal.     Pupils: Pupils are equal, round, and reactive to light.  Neck:     Vascular: No carotid bruit.  Cardiovascular:     Rate and Rhythm: Normal rate and regular rhythm.     Heart sounds: Normal heart sounds.  Pulmonary:     Effort: Pulmonary effort is normal.     Breath sounds: Normal breath sounds.     Comments: Few distant faint end expiratory wheezes,  but overall clear.  Normal effort Abdominal:     General: There is no distension.     Palpations: Abdomen is soft. There is no pulsatile mass.     Tenderness:  There is abdominal  tenderness (Diffuse, left lower greater than suprapubic, left upper, generalized.  No distention.).  Musculoskeletal:     Right lower leg: No edema.     Left lower leg: No edema.  Skin:    General: Skin is warm and dry.  Neurological:     Mental Status: She is alert and oriented to person, place, and time.  Psychiatric:        Mood and Affect: Mood normal.        Behavior: Behavior normal.        Assessment & Plan:  Julisa K Epps is a 54 y.o. female . Nausea - Plan: ondansetron  (ZOFRAN ) 4 MG tablet, CBC, Basic metabolic panel with GFR  Fatigue, unspecified type - Plan: CBC, Basic metabolic panel with GFR  Diarrhea, unspecified type - Plan: CBC, Basic metabolic panel with GFR  Generalized abdominal pain - Plan: CBC, Basic metabolic panel with GFR  Suspected viral illness with sick contacts at work.  Reassuring vital signs.  She is drinking fluids.  Appears hydrated.  Diffuse abdominal discomfort, likely related to diarrhea.  Zofran  prescribed for nausea, continue symptomatic care for now with oral rehydration therapy discussed.  Check BMP, CBC to evaluate renal function.  Evaluate for significant leukocytosis given her abdominal pain and consider further testing based on those results.  RTC/urgent care/ER precautions given over the next 48 hours. Meds ordered this encounter  Medications   ondansetron  (ZOFRAN ) 4 MG tablet    Sig: Take 1 tablet (4 mg total) by mouth every 8 (eight) hours as needed for nausea or vomiting.    Dispense:  20 tablet    Refill:  0   Patient Instructions  Unfortunately I think you probably have picked up a similar virus as coworkers.  Continue to drink plenty of fluids.  I did send in some Zofran  to help with nausea if needed.  Fluids are more important than food at this time but if you do feel like things are improving, start with crackers or bland food initially.  If any concerns on labs I will let you know.  If you are not improving  in the next 24 to 48 hours I do want you to be seen again as would likely recommend further testing including stool testing at that time.  If any worsening symptoms in the interim be seen here, urgent care or the ER.  Hang in there.  Viral Gastroenteritis, Adult  Viral gastroenteritis is also known as the stomach flu. This condition may affect your stomach, small intestine, and large intestine. It can cause sudden watery diarrhea, fever, and vomiting. This condition is caused by many different viruses. These viruses can be passed from person to person very easily (are contagious). Diarrhea and vomiting can make you feel weak and cause you to become dehydrated. You may not be able to keep fluids down. Dehydration can make you tired and thirsty, cause you to have a dry mouth, and decrease how often you urinate. It is important to replace the fluids that you lose from diarrhea and vomiting. What are the causes? Gastroenteritis is caused by many viruses, including rotavirus and norovirus. Norovirus is the most common cause in adults. You can get sick after being exposed to the viruses from other people. You can also get sick by: Eating food, drinking water, or touching a surface contaminated with one of these viruses. Sharing utensils or other personal items with an infected person. What increases the risk? You are more likely to develop this  condition if you: Have a weak body defense system (immune system). Live with one or more children who are younger than 2 years. Live in a nursing home. Travel on cruise ships. What are the signs or symptoms? Symptoms of this condition start suddenly 1-3 days after exposure to a virus. Symptoms may last for a few days or for as long as a week. Common symptoms include watery diarrhea and vomiting. Other symptoms include: Fever. Headache. Fatigue. Pain in the abdomen. Chills. Weakness. Nausea. Muscle aches. Loss of appetite. How is this diagnosed? This  condition is diagnosed with a medical history and physical exam. You may also have a stool test to check for viruses or other infections. How is this treated? This condition typically goes away on its own. The focus of treatment is to prevent dehydration and restore lost fluids (rehydration). This condition may be treated with: An oral rehydration solution (ORS) to replace important salts and minerals (electrolytes) in your body. Take this if told by your health care provider. This is a drink that is sold at pharmacies and retail stores. Medicines to help with your symptoms. Probiotic supplements to reduce symptoms of diarrhea. Fluids given through an IV, if dehydration is severe. Older adults and people with other diseases or a weak immune system are at higher risk for dehydration. Follow these instructions at home: Eating and drinking  Take an ORS as told by your health care provider. Drink clear fluids in small amounts as you are able. Clear fluids include: Water. Ice chips. Diluted fruit juice. Low-calorie sports drinks. Drink enough fluid to keep your urine pale yellow. Eat small amounts of healthy foods every 3-4 hours as you are able. This may include whole grains, fruits, vegetables, lean meats, and yogurt. Avoid fluids that contain a lot of sugar or caffeine, such as energy drinks, sports drinks, and soda. Avoid spicy or fatty foods. Avoid alcohol. General instructions  Wash your hands often, especially after having diarrhea or vomiting. If soap and water are not available, use hand sanitizer. Make sure that all people in your household wash their hands well and often. Take over-the-counter and prescription medicines only as told by your health care provider. Rest at home while you recover. Watch your condition for any changes. Take a warm bath to relieve any burning or pain from frequent diarrhea episodes. Keep all follow-up visits. This is important. Contact a health care  provider if you: Cannot keep fluids down. Have symptoms that get worse. Have new symptoms. Feel light-headed or dizzy. Have muscle cramps. Get help right away if you: Have chest pain. Have trouble breathing or you are breathing very quickly. Have a fast heartbeat. Feel extremely weak or you faint. Have a severe headache, a stiff neck, or both. Have a rash. Have severe pain, cramping, or bloating in your abdomen. Have skin that feels cold and clammy. Feel confused. Have pain when you urinate. Have signs of dehydration, such as: Dark urine, very little urine, or no urine. Cracked lips. Dry mouth. Sunken eyes. Sleepiness. Weakness. Have signs of bleeding, such as: Seeing blood in your vomit. Having vomit that looks like coffee grounds. Having bloody or black stools or stools that look like tar. These symptoms may be an emergency. Get help right away. Call 911. Do not wait to see if the symptoms will go away. Do not drive yourself to the hospital. Summary Viral gastroenteritis is also known as the stomach flu. It can cause sudden watery diarrhea, fever, and vomiting. This  condition can be passed from person to person very easily (is contagious). Take an oral rehydration solution (ORS) if told by your health care provider. This is a drink that is sold at pharmacies and retail stores. Wash your hands often, especially after having diarrhea or vomiting. If soap and water are not available, use hand sanitizer. This information is not intended to replace advice given to you by your health care provider. Make sure you discuss any questions you have with your health care provider. Document Revised: 11/01/2020 Document Reviewed: 11/01/2020 Elsevier Patient Education  2024 [employer] Inc.    Signed,   Reyes Pines, MD [facility], [facility] 02/06/24 2:25 PM       [1]  Allergies Allergen Reactions   Clarithromycin Other (See  Comments)    Body cramps    Codeine Nausea And Vomiting   Penicillins Rash   Tylenol  [Acetaminophen ] Other (See Comments)    Can not take Tylenol  products due to liver   "

## 2024-02-09 ENCOUNTER — Ambulatory Visit: Payer: Self-pay | Admitting: Family Medicine

## 2024-02-15 ENCOUNTER — Other Ambulatory Visit: Payer: Self-pay | Admitting: Family Medicine

## 2024-02-15 DIAGNOSIS — E1165 Type 2 diabetes mellitus with hyperglycemia: Secondary | ICD-10-CM

## 2024-03-12 ENCOUNTER — Encounter

## 2024-03-24 ENCOUNTER — Ambulatory Visit: Admitting: Family Medicine

## 2024-03-28 ENCOUNTER — Encounter: Admitting: Gastroenterology

## 2024-04-30 ENCOUNTER — Ambulatory Visit: Admitting: Internal Medicine

## 2024-07-11 ENCOUNTER — Encounter: Admitting: Registered Nurse
